# Patient Record
Sex: Female | Born: 1965
Health system: Southern US, Community
[De-identification: ages and names within clinical notes are randomized; demographics above are authoritative.]

## PROBLEM LIST (undated history)

## (undated) DIAGNOSIS — D573 Sickle-cell trait: Secondary | ICD-10-CM

## (undated) DIAGNOSIS — K3184 Gastroparesis: Secondary | ICD-10-CM

## (undated) DIAGNOSIS — A048 Other specified bacterial intestinal infections: Secondary | ICD-10-CM

## (undated) DIAGNOSIS — G43909 Migraine, unspecified, not intractable, without status migrainosus: Secondary | ICD-10-CM

## (undated) DIAGNOSIS — G4730 Sleep apnea, unspecified: Secondary | ICD-10-CM

## (undated) DIAGNOSIS — R011 Cardiac murmur, unspecified: Secondary | ICD-10-CM

## (undated) DIAGNOSIS — R0600 Dyspnea, unspecified: Secondary | ICD-10-CM

## (undated) DIAGNOSIS — E781 Pure hyperglyceridemia: Secondary | ICD-10-CM

## (undated) DIAGNOSIS — M1990 Unspecified osteoarthritis, unspecified site: Secondary | ICD-10-CM

## (undated) DIAGNOSIS — E119 Type 2 diabetes mellitus without complications: Secondary | ICD-10-CM

## (undated) DIAGNOSIS — K219 Gastro-esophageal reflux disease without esophagitis: Secondary | ICD-10-CM

## (undated) DIAGNOSIS — T7840XA Allergy, unspecified, initial encounter: Secondary | ICD-10-CM

## (undated) DIAGNOSIS — I82409 Acute embolism and thrombosis of unspecified deep veins of unspecified lower extremity: Secondary | ICD-10-CM

## (undated) DIAGNOSIS — I10 Essential (primary) hypertension: Secondary | ICD-10-CM

## (undated) DIAGNOSIS — M549 Dorsalgia, unspecified: Secondary | ICD-10-CM

## (undated) DIAGNOSIS — D649 Anemia, unspecified: Secondary | ICD-10-CM

## (undated) DIAGNOSIS — G5600 Carpal tunnel syndrome, unspecified upper limb: Secondary | ICD-10-CM

## (undated) HISTORY — PX: ABDOMINAL HYSTERECTOMY: SHX81

## (undated) HISTORY — DX: Type 2 diabetes mellitus without complications: E11.9

## (undated) HISTORY — DX: Pure hyperglyceridemia: E78.1

## (undated) HISTORY — PX: TONSILLECTOMY: SUR1361

## (undated) HISTORY — DX: Acute embolism and thrombosis of unspecified deep veins of unspecified lower extremity: I82.409

## (undated) HISTORY — DX: Carpal tunnel syndrome, unspecified upper limb: G56.00

## (undated) HISTORY — DX: Sickle-cell trait: D57.3

## (undated) HISTORY — DX: Allergy, unspecified, initial encounter: T78.40XA

## (undated) HISTORY — PX: CATARACT EXTRACTION: SUR2

## (undated) HISTORY — DX: Dorsalgia, unspecified: M54.9

## (undated) HISTORY — PX: OTHER SURGICAL HISTORY: SHX169

## (undated) HISTORY — DX: Cardiac murmur, unspecified: R01.1

## (undated) HISTORY — PX: TOE SURGERY: SHX1073

## (undated) HISTORY — DX: Unspecified osteoarthritis, unspecified site: M19.90

## (undated) HISTORY — DX: Anemia, unspecified: D64.9

---

## 2010-06-18 LAB — HM DEXA SCAN: HM Dexa Scan: NORMAL

## 2013-06-18 LAB — HM PAP SMEAR: HM Pap smear: NORMAL

## 2014-06-18 LAB — HM MAMMOGRAPHY: HM Mammogram: NORMAL (ref 0–4)

## 2014-10-24 ENCOUNTER — Emergency Department (HOSPITAL_COMMUNITY)
Admission: EM | Admit: 2014-10-24 | Discharge: 2014-10-24 | Disposition: A | Discharge status: Home / Self Care | Payer: 59 | Attending: Emergency Medicine | Admitting: Emergency Medicine

## 2014-10-24 ENCOUNTER — Encounter (HOSPITAL_COMMUNITY): Payer: Self-pay | Admitting: Emergency Medicine

## 2014-10-24 DIAGNOSIS — H109 Unspecified conjunctivitis: Secondary | ICD-10-CM

## 2014-10-24 DIAGNOSIS — I10 Essential (primary) hypertension: Secondary | ICD-10-CM | POA: Diagnosis not present

## 2014-10-24 DIAGNOSIS — H578 Other specified disorders of eye and adnexa: Secondary | ICD-10-CM | POA: Diagnosis present

## 2014-10-24 HISTORY — DX: Essential (primary) hypertension: I10

## 2014-10-24 MED ORDER — TETRACAINE HCL 0.5 % OP SOLN
1.0000 [drp] | Freq: Once | OPHTHALMIC | Status: AC
Start: 2014-10-24 — End: 2014-10-24
  Administered 2014-10-24: 1 [drp] via OPHTHALMIC
  Filled 2014-10-24: qty 2

## 2014-10-24 MED ORDER — POLYMYXIN B-TRIMETHOPRIM 10000-0.1 UNIT/ML-% OP SOLN: 1.0000 [drp] | OPHTHALMIC | Status: AC

## 2014-10-24 MED ORDER — FLUORESCEIN SODIUM 1 MG OP STRP
1.0000 | ORAL_STRIP | Freq: Once | OPHTHALMIC | Status: AC
  Administered 2014-10-24: 1 via OPHTHALMIC
  Filled 2014-10-24: qty 1

## 2014-10-24 NOTE — Discharge Instructions (Signed)
Conjunctivitis Conjunctivitis is commonly called "pink eye." Conjunctivitis can be caused by bacterial or viral infection, allergies, or injuries. There is usually redness of the lining of the eye, itching, discomfort, and sometimes discharge. There may be deposits of matter along the eyelids. A viral infection usually causes a watery discharge, while a bacterial infection causes a yellowish, thick discharge. Pink eye is very contagious and spreads by direct contact. You may be given antibiotic eyedrops as part of your treatment. Before using your eye medicine, remove all drainage from the eye by washing gently with warm water and cotton balls. Continue to use the medication until you have awakened 2 mornings in a row without discharge from the eye. Do not rub your eye. This increases the irritation and helps spread infection. Use separate towels from other household members. Wash your hands with soap and water before and after touching your eyes. Use cold compresses to reduce pain and sunglasses to relieve irritation from light. Do not wear contact lenses or wear eye makeup until the infection is gone. SEEK MEDICAL CARE IF:   Your symptoms are not better after 3 days of treatment.  You have increased pain or trouble seeing.  The outer eyelids become very red or swollen. Document Released: 07/12/2004 Document Revised: 08/27/2011 Document Reviewed: 06/04/2005 [name] Patient Information 2015 [place], [place]. This information is not intended to replace advice given to you by your health care provider. Make sure you discuss any questions you have with your health care provider. Allergic Conjunctivitis The conjunctiva is a thin membrane that covers the visible white part of the eyeball and the underside of the eyelids. This membrane protects and lubricates the eye. The membrane has small blood vessels running through it that can normally be seen. When the conjunctiva becomes inflamed, the condition is  called conjunctivitis. In response to the inflammation, the conjunctival blood vessels become swollen. The swelling results in redness in the normally white part of the eye. The blood vessels of this membrane also react when a person has allergies and is then called allergic conjunctivitis. This condition usually lasts for as long as the allergy persists. Allergic conjunctivitis cannot be passed to another person (non-contagious). The likelihood of bacterial infection is great and the cause is not likely due to allergies if the inflamed eye has:  A sticky discharge.  Discharge or sticking together of the lids in the morning.  Scaling or flaking of the eyelids where the eyelashes come out.  Red swollen eyelids. CAUSES   Viruses.  Irritants such as foreign bodies.  Chemicals.  General allergic reactions.  Inflammation or serious diseases in the inside or the outside of the eye or the orbit (the boney cavity in which the eye sits) can cause a "red eye." SYMPTOMS   Eye redness.  Tearing.  Itchy eyes.  Burning feeling in the eyes.  Clear drainage from the eye.  Allergic reaction due to pollens or ragweed sensitivity. Seasonal allergic conjunctivitis is frequent in the spring when pollens are in the air and in the fall. DIAGNOSIS  This condition, in its many forms, is usually diagnosed based on the history and an ophthalmological exam. It usually involves both eyes. If your eyes react at the same time every year, allergies may be the cause. While most "red eyes" are due to allergy or an infection, the role of an eye (ophthalmological) exam is important. The exam can rule out serious diseases of the eye or orbit. TREATMENT   Non-antibiotic eye drops, ointments,  or medications by mouth may be prescribed if the ophthalmologist is sure the conjunctivitis is due to allergies alone.  Over-the-counter drops and ointments for allergic symptoms should be used only after other causes of  conjunctivitis have been ruled out, or as your caregiver suggests. Medications by mouth are often prescribed if other allergy-related symptoms are present. If the ophthalmologist is sure that the conjunctivitis is due to allergies alone, treatment is normally limited to drops or ointments to reduce itching and burning. HOME CARE INSTRUCTIONS   Wash hands before and after applying drops or ointments, or touching the inflamed eye(s) or eyelids.  Do not let the eye dropper tip or ointment tube touch the eyelid when putting medicine in your eye.  Stop using your soft contact lenses and throw them away. Use a new pair of lenses when recovery is complete. You should run through sterilizing cycles at least three times before use after complete recovery if the old soft contact lenses are to be used. Hard contact lenses should be stopped. They need to be thoroughly sterilized before use after recovery.  Itching and burning eyes due to allergies is often relieved by using a cool cloth applied to closed eye(s). SEEK MEDICAL CARE IF:   Your problems do not go away after two or three days of treatment.  Your lids are sticky (especially in the morning when you wake up) or stick together.  Discharge develops. Antibiotics may be needed either as drops, ointment, or by mouth.  You have extreme light sensitivity.  An oral temperature above 102 F (38.9 C) develops.  Pain in or around the eye or any other visual symptom develops. MAKE SURE YOU:   Understand these instructions.  Will watch your condition.  Will get help right away if you are not doing well or get worse. Document Released: 08/25/2002 Document Revised: 08/27/2011 Document Reviewed: 07/21/2007 [facility] Patient Information 2015 [place], [place]. This information is not intended to replace advice given to you by your health care provider. Make sure you discuss any questions you have with your health care provider.

## 2014-10-24 NOTE — ED Provider Notes (Signed)
CSN: [id number]     Arrival date & time 10/24/14  1936 History  This chart was scribed for non-physician practitioner Lorre Munroe, PA, working with Malvin Johns, MD, by Eustaquio Maize, ED Scribe. This patient was seen in room TR04C/TR04C and the patient's care was started at 7:55 PM.    Chief Complaint  Patient presents with  . Eye Problem   The history is provided by the patient. No language interpreter was used.     HPI Comments: Monique Wiggins is a 49 y.o. female with hx HTN who presents to the Emergency Department complaining of bilateral itching to eyes that began earlier today. She also complains of mild blurry vision. Pt reports that within 2 hours of onset, her eyes began to swell and become red. Pt rinsed her eyes out with water and took Benadryl without relief. Pt denies getting anything caught in the eyes. Pt mentions that she picked up a golf ball earlier today and rubbed her left eye shortly afterwards. She mentions that while golfing, the grass was being cut and flying all around. Denies exposure to anyone with pink eye.   Past Medical History  Diagnosis Date  . Hypertension    Past Surgical History  Procedure Laterality Date  . Abdominal hysterectomy    . Tummy tuck    . Tonsillectomy     No family history on file. History  Substance Use Topics  . Smoking status: Never Smoker   . Smokeless tobacco: Not on file  . Alcohol Use: Yes   OB History    No data available     Review of Systems  HENT: Negative for congestion and rhinorrhea.   Eyes: Positive for redness, itching and visual disturbance (Blurry vision to bilateral eyes. ).  Respiratory: Negative for cough and shortness of breath.   Cardiovascular: Negative for chest pain.  Gastrointestinal: Negative for nausea and vomiting.  Skin: Negative for rash.  Allergic/Immunologic: Negative for environmental allergies and food allergies.  Neurological: Negative for dizziness, light-headedness and headaches.       Allergies  Review of patient's allergies indicates no known allergies.  Home Medications   Prior to Admission medications   Not on File   Triage Vitals: BP 199/110 mmHg  Pulse 70  Temp(Src) 98.2 F (36.8 C) (Oral)  Resp 18  Ht 5\' 2"  (1.575 m)  Wt 175 lb (79.379 kg)  BMI 32.00 kg/m2  SpO2 100%   Physical Exam  Constitutional: She is oriented to person, place, and time. She appears well-developed and well-nourished. No distress.  HENT:  Head: Normocephalic and atraumatic.  Eyes: Conjunctivae and EOM are normal.  Mildly erythematous, swollen conjunctiva bilaterally, no foreign body, no exudate, no fluorescein uptake  Neck: Neck supple. No tracheal deviation present.  Cardiovascular: Normal rate.   Pulmonary/Chest: Effort normal. No respiratory distress.  Musculoskeletal: Normal range of motion.  Neurological: She is alert and oriented to person, place, and time.  Skin: Skin is warm and dry.  Psychiatric: She has a normal mood and affect. Her behavior is normal.  Nursing note and vitals reviewed.   ED Course  Procedures (including critical care time)  DIAGNOSTIC STUDIES: Oxygen Saturation is 100% on RA, normal by my interpretation.    COORDINATION OF CARE: 8:06 PM-Discussed treatment plan which includes OTC allergy medication with pt at bedside and pt agreed to plan.   Labs Review Labs Reviewed - No data to display  Imaging Review No results found.   EKG Interpretation None  MDM   Final diagnoses:  Bilateral conjunctivitis   Patient with allergic conjunctivitis. Will discharge to home with Polytrim drops. Recommend ophthalmology follow-up in 2 days no better. Recommend continuing taking Benadryl and Zyrtec/Claritin. Return precautions discussed.  I personally performed the services described in this documentation, which was scribed in my presence. The recorded information has been reviewed and is accurate.      Montine Circle,  PA-C 10/24/14 2013  Malvin Johns, MD 10/24/14 2248

## 2014-10-24 NOTE — ED Notes (Signed)
Pt c/o bilateral eye pain, redness, drainage and swelling. Reports being [place] golf [place] today in which they were mowing when pt felt irritation to her eyes. Also reports blurred vision. Pt noted to be hypertensive with hx of the same. Pt takes medications at home, did not take medication this morning

## 2014-10-24 NOTE — ED Notes (Signed)
Pt. reports bilateral red , teary and itchy eyes onset this evening with mild blurred vision .

## 2016-07-22 ENCOUNTER — Emergency Department (HOSPITAL_COMMUNITY)
Admission: EM | Admit: 2016-07-22 | Discharge: 2016-07-22 | Disposition: A | Discharge status: Home / Self Care | Payer: BLUE CROSS/BLUE SHIELD | Attending: Emergency Medicine | Admitting: Emergency Medicine

## 2016-07-22 ENCOUNTER — Encounter (HOSPITAL_COMMUNITY): Payer: Self-pay | Admitting: *Deleted

## 2016-07-22 DIAGNOSIS — Z79899 Other long term (current) drug therapy: Secondary | ICD-10-CM | POA: Diagnosis not present

## 2016-07-22 DIAGNOSIS — M545 Low back pain, unspecified: Secondary | ICD-10-CM

## 2016-07-22 DIAGNOSIS — R112 Nausea with vomiting, unspecified: Secondary | ICD-10-CM | POA: Diagnosis not present

## 2016-07-22 DIAGNOSIS — I10 Essential (primary) hypertension: Secondary | ICD-10-CM | POA: Insufficient documentation

## 2016-07-22 HISTORY — DX: Other specified bacterial intestinal infections: A04.8

## 2016-07-22 HISTORY — DX: Migraine, unspecified, not intractable, without status migrainosus: G43.909

## 2016-07-22 LAB — URINALYSIS, ROUTINE W REFLEX MICROSCOPIC
Bacteria, UA: NONE SEEN
Bilirubin Urine: NEGATIVE
GLUCOSE, UA: NEGATIVE mg/dL
Hgb urine dipstick: NEGATIVE
Ketones, ur: NEGATIVE mg/dL
Leukocytes, UA: NEGATIVE
Nitrite: NEGATIVE
PROTEIN: NEGATIVE mg/dL
Specific Gravity, Urine: 1.019 (ref 1.005–1.030)
pH: 5 (ref 5.0–8.0)

## 2016-07-22 LAB — COMPREHENSIVE METABOLIC PANEL
ALBUMIN: 4.3 g/dL (ref 3.5–5.0)
ALK PHOS: 66 U/L (ref 38–126)
ALT: 22 U/L (ref 14–54)
ANION GAP: 8 (ref 5–15)
AST: 23 U/L (ref 15–41)
BUN: 29 mg/dL — ABNORMAL HIGH (ref 6–20)
CALCIUM: 8.9 mg/dL (ref 8.9–10.3)
CO2: 24 mmol/L (ref 22–32)
CREATININE: 0.98 mg/dL (ref 0.44–1.00)
Chloride: 104 mmol/L (ref 101–111)
GFR calc Af Amer: >60 mL/min (ref >60)
GFR calc non Af Amer: >60 mL/min (ref >60)
GLUCOSE: 119 mg/dL — AB (ref 65–99)
Potassium: 3 mmol/L — ABNORMAL LOW (ref 3.5–5.1)
SODIUM: 136 mmol/L (ref 135–145)
Total Bilirubin: 0.8 mg/dL (ref 0.3–1.2)
Total Protein: 7.3 g/dL (ref 6.5–8.1)

## 2016-07-22 LAB — CBC
HCT: 39.4 % (ref 36.0–46.0)
HEMOGLOBIN: 13.2 g/dL (ref 12.0–15.0)
MCH: 27 pg (ref 26.0–34.0)
MCHC: 33.5 g/dL (ref 30.0–36.0)
MCV: 80.6 fL (ref 78.0–100.0)
Platelets: 385 10*3/uL (ref 150–400)
RBC: 4.89 MIL/uL (ref 3.87–5.11)
RDW: 13.9 % (ref 11.5–15.5)
WBC: 10.4 10*3/uL (ref 4.0–10.5)

## 2016-07-22 LAB — LIPASE, BLOOD: Lipase: 20 U/L (ref 11–51)

## 2016-07-22 MED ORDER — POTASSIUM CHLORIDE CRYS ER 20 MEQ PO TBCR
40.0000 meq | EXTENDED_RELEASE_TABLET | Freq: Once | ORAL | Status: AC
  Administered 2016-07-22: 40 meq via ORAL
  Filled 2016-07-22: qty 2

## 2016-07-22 MED ORDER — SODIUM CHLORIDE 0.9 % IV BOLUS (SEPSIS)
1000.0000 mL | Freq: Once | INTRAVENOUS | Status: AC
  Administered 2016-07-22: 1000 mL via INTRAVENOUS

## 2016-07-22 MED ORDER — METHOCARBAMOL 500 MG PO TABS: 500.0000 mg | ORAL_TABLET | Freq: Every evening | ORAL | 0 refills | Status: DC | PRN

## 2016-07-22 MED ORDER — ONDANSETRON HCL 4 MG PO TABS: 4.0000 mg | ORAL_TABLET | Freq: Four times a day (QID) | ORAL | 0 refills | Status: DC

## 2016-07-22 MED ORDER — ONDANSETRON 4 MG PO TBDP
4.0000 mg | ORAL_TABLET | Freq: Once | ORAL | Status: AC | PRN
  Administered 2016-07-22: 4 mg via ORAL
  Filled 2016-07-22: qty 1

## 2016-07-22 MED ORDER — NAPROXEN 500 MG PO TABS: 500.0000 mg | ORAL_TABLET | Freq: Two times a day (BID) | ORAL | 0 refills | Status: DC

## 2016-07-22 NOTE — ED Notes (Signed)
Ginger Ale and crackers given to patient.

## 2016-07-22 NOTE — ED Triage Notes (Signed)
BIB [facility] with Abd pain in lower abd, started about 5am, went to work symptoms worse, lower back included. Pt has # 20 R h, declined Zofran, EKG SR

## 2016-07-22 NOTE — ED Notes (Signed)
Pt ambulated to restroom without difficulty

## 2016-07-22 NOTE — ED Notes (Signed)
Patient states that she also has right flank pain that began x2 days ago.

## 2016-07-22 NOTE — ED Provider Notes (Signed)
[facility] DEPT Provider Note   CSN: [id number] Arrival date & time: 07/22/16  0913     History   Chief Complaint Chief Complaint  Patient presents with  . Abdominal Pain    HPI Monique Wiggins is a 51 y.o. female presenting with sudden onset right-sided lower back pain earlier today. She has had milder right lower back pain for one week. She woke up this morning and had lower abdominal pain while straining to have a bowel movement and felt sweaty. Abdominal pain subsided after bowel movement and she went to work. Once at work she had another episode but was her right lower back accompanied by nausea and vomiting. She was in a patient room when she vomited and had severe back pain and she could not move. This is when EMS was called.  As I was assessing her, she described her abdominal discomfort as comparable to mild menstrual cramps and reports feeling bloated lately. Patient states that she had a hysterectomy 10 years ago. She also had H pylori back in 1997 which does not show on her record and reports feeling bloated after meals.  She has tried 800 ibuprofen twice a day for the past week which was helping until today. No history of kidney stones but has family hx (sister). She was given Zofran prior to my assessment and is no longer nauseated. she was comfortable at this time. She denies fever, dysuria, hematuria, diarrhea, blood in her stool, coffee-ground emesis, black tarry stools or cold symptoms. She does reports ill contacts at work with "stomach bug".  HPI  Past Medical History:  Diagnosis Date  . H. pylori infection    1997  . Hypertension   . Migraines     There are no active problems to display for this patient.   Past Surgical History:  Procedure Laterality Date  . ABDOMINAL HYSTERECTOMY    . TOE SURGERY    . TONSILLECTOMY      OB History    No data available       Home Medications    Prior to Admission medications   Medication Sig Start Date End Date  Taking? Authorizing Provider  ibuprofen (ADVIL,MOTRIN) 200 MG tablet Take 800 mg by mouth every 6 (six) hours as needed for mild pain.   Yes Historical Provider, MD  topiramate (TOPAMAX) 50 MG tablet Take 50 mg by mouth daily.   Yes Historical Provider, MD  methocarbamol (ROBAXIN) 500 MG tablet Take 1 tablet (500 mg total) by mouth at bedtime as needed for muscle spasms. 07/22/16   Emeline General, PA-C  naproxen (NAPROSYN) 500 MG tablet Take 1 tablet (500 mg total) by mouth 2 (two) times daily with a meal. 07/22/16   Emeline General, PA-C  ondansetron (ZOFRAN) 4 MG tablet Take 1 tablet (4 mg total) by mouth every 6 (six) hours. 07/22/16   Emeline General, PA-C    Family History No family history on file.  Social History Social History  Substance Use Topics  . Smoking status: Never Smoker  . Smokeless tobacco: Never Used  . Alcohol use No     Allergies   Shellfish allergy   Review of Systems Review of Systems  Constitutional: Positive for chills. Negative for appetite change and fever.  HENT: Negative for congestion, ear pain, postnasal drip, rhinorrhea, sinus pressure, sore throat and trouble swallowing.   Eyes: Negative for pain and visual disturbance.  Respiratory: Negative for cough, chest tightness, shortness of breath, wheezing and stridor.  Cardiovascular: Negative for chest pain, palpitations and leg swelling.  Gastrointestinal: Positive for abdominal distention, constipation, nausea and vomiting. Negative for abdominal pain, blood in stool and diarrhea.  Genitourinary: Negative for difficulty urinating, dysuria, flank pain, frequency, hematuria and pelvic pain.  Musculoskeletal: Positive for back pain and myalgias. Negative for arthralgias, gait problem, joint swelling, neck pain and neck stiffness.  Skin: Negative for color change, pallor and rash.  Neurological: Negative for dizziness, seizures, syncope, weakness and numbness.  All other systems reviewed and are  negative.    Physical Exam Updated Vital Signs BP 114/75   Pulse (!) 59   Temp 98.5 F (36.9 C) (Oral)   Resp 16   Ht 5' 1.75" (1.568 m)   Wt 77.1 kg   SpO2 98%   BMI 31.35 kg/m   Physical Exam  Constitutional: She is oriented to person, place, and time. She appears well-developed and well-nourished. No distress.  Patient is afebrile, nontoxic appearing, sitting comfortably in bed in no acute distress.  HENT:  Head: Normocephalic and atraumatic.  Mouth/Throat: Oropharynx is clear and moist. No oropharyngeal exudate.  Eyes: Conjunctivae and EOM are normal. Pupils are equal, round, and reactive to light. Right eye exhibits no discharge. Left eye exhibits no discharge. No scleral icterus.  Neck: Normal range of motion. Neck supple.  Cardiovascular: Normal rate, regular rhythm and normal heart sounds.   No murmur heard. Pulmonary/Chest: Effort normal and breath sounds normal. No respiratory distress. She has no wheezes. She has no rales. She exhibits no tenderness.  Abdominal: Soft. Bowel sounds are normal. She exhibits no distension and no mass. There is no tenderness. There is no rebound and no guarding.  Patient is nontender to palpation of the abdomen. No peritoneal signs, guarding, negative Murphy, negative McBurney, negative rebound.  Musculoskeletal: She exhibits no edema.  Neurological: She is alert and oriented to person, place, and time. No sensory deficit. She exhibits normal muscle tone. Coordination normal.  Skin: Skin is warm and dry. No rash noted. She is not diaphoretic. No pallor.  Psychiatric: She has a normal mood and affect. Her behavior is normal.  Nursing note and vitals reviewed.    ED Treatments / Results  Labs (all labs ordered are listed, but only abnormal results are displayed) Labs Reviewed  COMPREHENSIVE METABOLIC PANEL - Abnormal; Notable for the following:       Result Value   Potassium 3.0 (*)    Glucose, Bld 119 (*)    BUN 29 (*)    All  other components within normal limits  URINALYSIS, ROUTINE W REFLEX MICROSCOPIC - Abnormal; Notable for the following:    Squamous Epithelial / LPF 0-5 (*)    All other components within normal limits  LIPASE, BLOOD  CBC    EKG  EKG Interpretation None       Radiology No results found.  Procedures Procedures (including critical care time)  Medications Ordered in ED Medications  ondansetron (ZOFRAN-ODT) disintegrating tablet 4 mg (4 mg Oral Given 07/22/16 0933)  sodium chloride 0.9 % bolus 1,000 mL (1,000 mLs Intravenous New Bag/Given 07/22/16 1548)  potassium chloride SA (K-DUR,KLOR-CON) CR tablet 40 mEq (40 mEq Oral Given 07/22/16 1548)     Initial Impression / Assessment and Plan / ED Course  I have reviewed the triage vital signs and the nursing notes.  Pertinent labs & imaging results that were available during my care of the patient were reviewed by me and considered in my medical decision making (see chart  for details).    51 year old female presenting with lower back pain, nausea, vomiting. Patient was already feeling better after Zofran. When I assessed her she was comfortably lying in bed in no distress with no pain or nausea. Patient is afebrile and nontoxic and well-appearing. Reassuring exam.  On exam she was tender to palpation of the posterior superior iliac spine. No abdominal tenderness to palpation and exam otherwise unremarkable. Labs unremarkable other than mild hypokalemia and signs of dehydration.  Patient presents with lower back pain.  No gross neurological deficits and normal neuro exam.  Patient has no gait abnormality or concern for cauda equina.  No loss of bowel or bladder control, fever, night sweats, weight loss, h/o malignancy, or IVDU.  RICE protocol and pain medications indicated and discussed with patient.   Patient walked herself to the bathroom while in the emergency department with no difficulties  IV fluids and potassium supplement. Will  Reassess  3:00 pm checked on patient and ordered fluids had not been initiated. Contacted nursing to get iv fluids started. Will reassess after fluids.  Transferred patient care at end of shift to Will Dansie, PA-C, pending fluid completion and anticipating discharge home with PCP f/U.   4:30- reassessed patient and she was doing well. Discharge plan was discussed with patient and she agreed with said plan. Patient significantly improved while in ED. Discussed strict return precautions. Patient was advised to return to the emergency department if experiencing any worsening of symptoms.Patient understood instructions and agreed with discharge plan.  Patient was discussed with Dr. Lacinda Axon who also has seen patient and agrees with assessment and plan.  Final Clinical Impressions(s) / ED Diagnoses   Final diagnoses:  Acute right-sided low back pain without sciatica  Non-intractable vomiting with nausea, unspecified vomiting type    New Prescriptions New Prescriptions   METHOCARBAMOL (ROBAXIN) 500 MG TABLET    Take 1 tablet (500 mg total) by mouth at bedtime as needed for muscle spasms.   NAPROXEN (NAPROSYN) 500 MG TABLET    Take 1 tablet (500 mg total) by mouth 2 (two) times daily with a meal.   ONDANSETRON (ZOFRAN) 4 MG TABLET    Take 1 tablet (4 mg total) by mouth every 6 (six) hours.     Emeline General, PA-C 07/22/16 [place], MD 07/24/16 [phone]

## 2016-07-22 NOTE — ED Provider Notes (Signed)
Patient care assumed from Avie Echevaria, PA-C at shift change please see her note for further.  Briefly, the patient presented with abdominal and back pain with nausea and vomiting.  Plan at shift changes to reevaluate after fluids and by mouth trial. Plan is for discharge if patient passes by mouth trial. Patient received fluid bolus and tolerated by mouth trial. On my evaluation she tells me she is feeling much better. She is a middle-aged the bathroom. Her abdominal pain has resolved. She feels ready for discharge. We'll discharge with prescriptions as dictated by previous provider. I discussed return precautions. I advised the patient to follow-up with their primary care provider this week. I advised the patient to return to the emergency department with new or worsening symptoms or new concerns. The patient verbalized understanding and agreement with plan.     Acute right-sided low back pain without sciatica  Non-intractable vomiting with nausea, unspecified vomiting type     Waynetta Pean, PA-C 07/22/16 1727    Nat Christen, MD 07/23/16 [place], MD 07/23/16 [phone]

## 2016-10-29 ENCOUNTER — Telehealth: Payer: Self-pay | Admitting: Behavioral Health

## 2016-10-29 ENCOUNTER — Encounter: Payer: Self-pay | Admitting: Behavioral Health

## 2016-10-29 NOTE — Telephone Encounter (Signed)
Pre-Visit Call completed with patient and chart updated.   Pre-Visit Info documented in Specialty Comments under [employer].    

## 2016-10-30 ENCOUNTER — Encounter: Payer: Self-pay | Admitting: Family

## 2016-10-30 ENCOUNTER — Ambulatory Visit (INDEPENDENT_AMBULATORY_CARE_PROVIDER_SITE_OTHER): Payer: BLUE CROSS/BLUE SHIELD | Admitting: Family

## 2016-10-30 VITALS — BP 136/96 | HR 56 | Temp 98.3°F | Resp 18 | Ht 62.5 in | Wt 181.0 lb

## 2016-10-30 DIAGNOSIS — G43909 Migraine, unspecified, not intractable, without status migrainosus: Secondary | ICD-10-CM

## 2016-10-30 DIAGNOSIS — Z889 Allergy status to unspecified drugs, medicaments and biological substances status: Secondary | ICD-10-CM

## 2016-10-30 DIAGNOSIS — R7303 Prediabetes: Secondary | ICD-10-CM

## 2016-10-30 DIAGNOSIS — Z9189 Other specified personal risk factors, not elsewhere classified: Secondary | ICD-10-CM

## 2016-10-30 DIAGNOSIS — I10 Essential (primary) hypertension: Secondary | ICD-10-CM | POA: Diagnosis not present

## 2016-10-30 DIAGNOSIS — E876 Hypokalemia: Secondary | ICD-10-CM

## 2016-10-30 LAB — BASIC METABOLIC PANEL
BUN: 14 mg/dL (ref 6–23)
CHLORIDE: 106 meq/L (ref 96–112)
CO2: 24 mEq/L (ref 19–32)
Calcium: 9.4 mg/dL (ref 8.4–10.5)
Creatinine, Ser: 1.02 mg/dL (ref 0.40–1.20)
GFR: 73.17 mL/min (ref >60.00)
Glucose, Bld: 101 mg/dL — ABNORMAL HIGH (ref 70–99)
Potassium: 3.1 mEq/L — ABNORMAL LOW (ref 3.5–5.1)
SODIUM: 138 meq/L (ref 135–145)

## 2016-10-30 MED ORDER — EPINEPHRINE 0.3 MG/0.3ML IJ SOAJ: INTRAMUSCULAR | 0 refills | Status: DC

## 2016-10-30 MED ORDER — CETIRIZINE HCL 10 MG PO TABS: 10.0000 mg | ORAL_TABLET | Freq: Every day | ORAL | 11 refills | Status: DC

## 2016-10-30 NOTE — Patient Instructions (Signed)
Please complete lab work prior to leaving. You will be contacted about your referral to nutrition and allergy. Keep epipen and liquid benadryl with you at all times. Avoid all shellfish. Begin zyrtec 10mg  once daily to help with your allergy symptoms. Welcome to [facility]!

## 2016-10-31 ENCOUNTER — Other Ambulatory Visit: Payer: Self-pay | Admitting: Family

## 2016-10-31 ENCOUNTER — Telehealth: Payer: Self-pay | Admitting: Family

## 2016-10-31 DIAGNOSIS — E876 Hypokalemia: Secondary | ICD-10-CM

## 2016-10-31 NOTE — Progress Notes (Signed)
Subjective:    Patient ID: Monique Wiggins, female    DOB: July 12, 1965, 51 y.o.   MRN: [id number]  HPI   [name] is a 51 yr old female who presents today to establish care.  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 Reports that if sh touches raw shellfish she has local swelling. Had similar reaction to her sister's cat (eyes swelled up and she began to have tongue swelling.   2) Hypertension- currently maintained on amlodipine, coreg and hyzaar.   3) Migraine- maintained on topamax. Reports that migraines are well controlled. She brings with her lab work from April from her previous provider.  4) Hyperglycemia- Labs from previous provider show A1C 6.1 back in April. She was not aware of this.  Notes strong family hx of DM2    Review of Systems    see HPI  Past Medical History:  Diagnosis Date  . Allergy   . Hypertension      Social History   Social History  . Marital status: Married    Spouse name: N/A  . Number of children: N/A  . Years of education: N/A   Occupational History  . Not on file.   Social History Main Topics  . Smoking status: Never Smoker  . Smokeless tobacco: Never Used  . Alcohol use Yes  . Drug use: No  . Sexual activity: Not on file   Other Topics Concern  . Not on file   Social History Narrative  . No narrative on file    Past Surgical History:  Procedure Laterality Date  . ABDOMINAL HYSTERECTOMY    . TOE SURGERY    . TONSILLECTOMY    . tummy tuck      Family History  Problem Relation Age of Onset  . Diabetes Neg Hx     Allergies  Allergen Reactions  . Pollen Extract Swelling  . Shellfish Allergy Swelling    Current Outpatient Prescriptions on File Prior to Visit  Medication Sig Dispense Refill  . amLODipine (NORVASC) 5 MG tablet Take 5 mg by mouth daily.    . carvedilol (COREG) 25 MG tablet Take 25 mg by mouth 2 (two) times daily with a meal.    . mometasone  (ELOCON) 0.1 % cream Apply 1 application topically daily.    Marland Kitchen topiramate (TOPAMAX) 50 MG tablet Take 50 mg by mouth daily.     No current facility-administered medications on file prior to visit.     BP (!) 136/96 (BP Location: Right Arm, Cuff Size: Normal)   Pulse (!) 56   Temp 98.3 F (36.8 C) (Oral)   Resp 18   Ht 5' 2.5" (1.588 m)   Wt 181 lb (82.1 kg)   SpO2 98%   BMI 32.58 kg/m    Objective:   Physical Exam  Constitutional: She is oriented to person, place, and time. She appears well-developed and well-nourished.  HENT:  Head: Normocephalic and atraumatic.  Mild eczema noted in bilateral ear canals  Eyes: Conjunctivae are normal. No scleral icterus.  Neck: Neck supple. No thyromegaly present.  Cardiovascular: Normal rate, regular rhythm and normal heart sounds.   No murmur heard. Pulmonary/Chest: Effort normal and breath sounds normal. No respiratory distress. She has no wheezes.  Musculoskeletal: She exhibits no edema.  Lymphadenopathy:    She has no cervical adenopathy.  Neurological: She is alert and oriented to person, place, and time.  Skin: Skin is warm and dry.  Psychiatric: She has a normal mood and affect. Her behavior is normal. Judgment and thought content normal.          Assessment & Plan:  Allergies- severe environmental and food (shellfish allergy). Refer to allergist for further evaluation and treatment. Rx provided for epipen and we discussed proper use.  She understands that if he needs to administer she will need to call 911. Add zyrtec 10mg  once daily.   HTN- DBP mildly elevated. Continue current BP meds for now. If not better next visit will need to further adjust meds.   Migraine- stable on topamax. Continue same. Recent BMET wnl.   Borderline DM- discussed diabetic diet, exercise and weight loss. Refer to nutritionist.   Hypokalemia- noted on last bmet.  Repeat bmet shows low K again, add Kdur- see phone note.   30 minutes spent with  pt today. >50% of this time was spent counseling patient on allergies, epipen use, diabetic diet/exercise/weight loss.

## 2016-10-31 NOTE — Telephone Encounter (Signed)
Potassium is low. Add Kdur once daily, repeat bmet in 1 week.

## 2016-10-31 NOTE — Telephone Encounter (Signed)
LMOM with contact name and number for return call RE: results and further provider instructions; Rx and future lab order pending/SLS 05/16

## 2016-10-31 NOTE — Telephone Encounter (Signed)
Caller name:Marticia Megan Salon Relationship to patient: Can be AJLUNGB:[phone] Pharmacy:  Reason for call:returing call regarding lab work, my leave voicemail if no answer

## 2016-11-01 ENCOUNTER — Other Ambulatory Visit: Payer: BLUE CROSS/BLUE SHIELD

## 2016-11-01 ENCOUNTER — Telehealth: Payer: Self-pay | Admitting: *Deleted

## 2016-11-01 MED ORDER — POTASSIUM CHLORIDE CRYS ER 20 MEQ PO TBCR: 20.0000 meq | EXTENDED_RELEASE_TABLET | Freq: Every day | ORAL | 3 refills | Status: DC

## 2016-11-01 NOTE — Telephone Encounter (Addendum)
See 10/31/16 refill note.

## 2016-11-01 NOTE — Telephone Encounter (Signed)
Pt notified and future orders signed.

## 2016-11-01 NOTE — Telephone Encounter (Signed)
PT signed records release for [facility]. Release faxed to [phone]. Awaiting records.

## 2016-11-08 ENCOUNTER — Other Ambulatory Visit (INDEPENDENT_AMBULATORY_CARE_PROVIDER_SITE_OTHER): Payer: BLUE CROSS/BLUE SHIELD

## 2016-11-08 ENCOUNTER — Encounter: Payer: Self-pay | Admitting: Family

## 2016-11-08 DIAGNOSIS — E876 Hypokalemia: Secondary | ICD-10-CM | POA: Diagnosis not present

## 2016-11-08 LAB — BASIC METABOLIC PANEL
BUN: 21 mg/dL (ref 6–23)
CALCIUM: 9 mg/dL (ref 8.4–10.5)
CO2: 27 mEq/L (ref 19–32)
Chloride: 106 mEq/L (ref 96–112)
Creatinine, Ser: 1.04 mg/dL (ref 0.40–1.20)
GFR: 71.54 mL/min (ref >60.00)
Glucose, Bld: 86 mg/dL (ref 70–99)
Potassium: 3.8 mEq/L (ref 3.5–5.1)
Sodium: 138 mEq/L (ref 135–145)

## 2016-11-20 ENCOUNTER — Ambulatory Visit: Payer: BLUE CROSS/BLUE SHIELD | Admitting: Registered"

## 2016-11-27 ENCOUNTER — Encounter: Payer: BLUE CROSS/BLUE SHIELD | Attending: Family | Admitting: Registered"

## 2016-11-27 ENCOUNTER — Encounter: Payer: Self-pay | Admitting: Registered"

## 2016-11-27 DIAGNOSIS — R739 Hyperglycemia, unspecified: Secondary | ICD-10-CM

## 2016-11-27 DIAGNOSIS — R7303 Prediabetes: Secondary | ICD-10-CM | POA: Diagnosis not present

## 2016-11-27 NOTE — Patient Instructions (Signed)
   Consider increasing activity with a goal of 3-5x per week for 30-45 min.  Aim to have balanced meals and snacks throughout the day  Continue to work on getting better sleep

## 2016-11-27 NOTE — Progress Notes (Signed)
Medical Nutrition Therapy:  Appt start time: 1100 end time:  1200.  Assessment:  Primary concerns today: Pt states she is here due to pre-diabetes. This patient is accompanied in the office by her spouse.  Pt states she has had a recent weight gain ~20 lbs since she got married in Sept. Pt states she used to eat healthier. Pt reports when she first got married they were eating out a lot, but they now cooks more at home. Pt works three 12-hr days. Pt states she will sometimes have green smoothie for meal replacement and uses recipes from [employer] book 'green smoothies for life.'  Preferred Learning Style:   No preference indicated   Learning Readiness:   Change in progress  MEDICATIONS: reveiwed   DIETARY INTAKE:  Usual eating pattern includes 3 meals and 2-3 snacks per day. Avoided foods include shellfish due to allergy.  24-hr recall:  B ( AM): bacon, eggs, grapefruit, water, coffee, herbal tea Snk ( AM): mixed nuts with dried fruit OR nutrigrain bar L ( PM): green smoothie (Pro, flaxseed, veggie & fruit) OR protein drink 30g Snk ( PM): same as morning snack D ( PM): salads, meat, sometimes potato, quinoa Snk ( PM): none OR icy pops Beverages: water, coffee, tea  Usual physical activity: ADL & walking  Estimated energy needs: 1600 calories 180 g carbohydrates 120 g protein 44 g fat  Progress Towards Goal(s):  In progress.   Nutritional Diagnosis:  NI-5.8.1 Inadequate carbohydrate intake As related to pt belief that carbs need to be very restricted.  As evidenced by pt states avoidance and dietary recall.    Intervention:  Nutrition Education. Nutrition education for managing blood glucose with diet and lifestyle changes. Described diabetes. Defined the role of glucose and insulin. Described the role of different macronutrients on glucose.  Explained how carbohydrates affect blood glucose.  Stated what foods contain the most carbohydrates.  Demonstrated carbohydrate  counting.  Demonstrated how to read Nutrition Facts food label. Described the role of stress management and exercise in BG control.   Plan:  Consider increasing activity with a goal of 3-5x per week for 30-45 min.  Aim to have balanced meals and snacks throughout the day  Continue to work on getting better sleep  Teaching Method Utilized:  Visual Auditory Hands on  Handouts given during visit include:  My Plate  Snack sheet  Barriers to learning/adherence to lifestyle change: none  Demonstrated degree of understanding via:  Teach Back   Monitoring/Evaluation:  Dietary intake, exercise, and body weight PRN.

## 2017-01-14 ENCOUNTER — Ambulatory Visit (INDEPENDENT_AMBULATORY_CARE_PROVIDER_SITE_OTHER): Payer: BLUE CROSS/BLUE SHIELD | Admitting: Medical

## 2017-01-14 ENCOUNTER — Encounter: Payer: Self-pay | Admitting: Medical

## 2017-01-14 VITALS — BP 152/88 | HR 72 | Temp 98.0°F | Resp 16 | Ht 62.0 in | Wt 189.0 lb

## 2017-01-14 DIAGNOSIS — I10 Essential (primary) hypertension: Secondary | ICD-10-CM | POA: Diagnosis not present

## 2017-01-14 MED ORDER — AMLODIPINE BESYLATE 10 MG PO TABS
10.0000 mg | ORAL_TABLET | Freq: Every day | ORAL | 0 refills | Status: DC
Start: 2017-01-14 — End: 2017-02-19

## 2017-01-14 NOTE — Progress Notes (Signed)
Subjective:    Patient ID: Monique Wiggins, female    DOB: 05-14-1966, 51 y.o.   MRN: [id number]  HPI  Pt in states recent at work 160/92. This was today. Around 12 pm. Work not stressful. No caffeine. Some ha since this morning. Pt states was mild . Did take hyzaar, coreg and norvasc today. She says has been on same regimen for years.   Pt states does not check bp regular basis. She last checked one month ago was on low side. She states 106/50-60.(she can't remember but thinks was electronic). She acknowledges this does not match historical trend on review today.  Bmp in may looked good.  Prior ha earlier today has for most part subsided.  In past pt states amlodipine in the past and ha pedal edema. But she now has socks that help prevent edema.She states socks are quite effective in preventing edema.    Review of Systems  Constitutional: Negative for chills, fatigue and fever.  Respiratory: Negative for cough, chest tightness, shortness of breath and wheezing.   Cardiovascular: Negative for chest pain and palpitations.  Gastrointestinal: Negative for abdominal pain, blood in stool, constipation, diarrhea, nausea and vomiting.  Genitourinary: Negative for dyspareunia and dysuria.  Musculoskeletal: Negative for back pain, gait problem and neck pain.  Skin: Negative for rash.  Neurological: Negative for dizziness, speech difficulty, weakness and headaches.       Ha resolved/subsided by time of exam.  Hematological: Negative for adenopathy. Does not bruise/bleed easily.  Psychiatric/Behavioral: Negative for behavioral problems and confusion.     Past Medical History:  Diagnosis Date  . Allergy   . Hypertension      Social History   Social History  . Marital status: Married    Spouse name: N/A  . Number of children: N/A  . Years of education: N/A   Occupational History  . Not on file.   Social History Main Topics  . Smoking status: Never Smoker  . Smokeless  tobacco: Never Used  . Alcohol use Yes  . Drug use: No  . Sexual activity: Not on file   Other Topics Concern  . Not on file   Social History Narrative  . No narrative on file    Past Surgical History:  Procedure Laterality Date  . ABDOMINAL HYSTERECTOMY    . TOE SURGERY    . TONSILLECTOMY    . tummy tuck      Family History  Problem Relation Age of Onset  . Diabetes Neg Hx     Allergies  Allergen Reactions  . Pollen Extract Swelling  . Shellfish Allergy Swelling    Current Outpatient Prescriptions on File Prior to Visit  Medication Sig Dispense Refill  . carvedilol (COREG) 25 MG tablet Take 25 mg by mouth 2 (two) times daily with a meal.    . cetirizine (ZYRTEC) 10 MG tablet Take 1 tablet (10 mg total) by mouth daily. 30 tablet 11  . EPINEPHrine 0.3 mg/0.3 mL IJ SOAJ injection Inject once into muscle as needed for severe allergic reaction, then call 911 2 Device 0  . losartan-hydrochlorothiazide (HYZAAR) 100-12.5 MG tablet Take 1 tablet by mouth daily.  1  . mometasone (ELOCON) 0.1 % cream Apply 1 application topically daily.    . potassium chloride SA (K-DUR,KLOR-CON) 20 MEQ tablet Take 1 tablet (20 mEq total) by mouth daily. 30 tablet 3  . terbinafine (LAMISIL) 250 MG tablet Take 1 tablet by mouth daily.  0  . topiramate (TOPAMAX) 50  MG tablet Take 50 mg by mouth daily.     No current facility-administered medications on file prior to visit.     BP (!) 152/88   Pulse 72   Temp 98 F (36.7 C) (Oral)   Resp 16   Ht 5\' 2"  (1.575 m)   Wt 189 lb (85.7 kg)   SpO2 99%   BMI 34.57 kg/m       Objective:   Physical Exam  General Mental Status- Alert. General Appearance- Not in acute distress.   Skin General: Color- Normal Color. Moisture- Normal Moisture.  Neck Carotid Arteries- Normal color. Moisture- Normal Moisture. No carotid bruits. No JVD.  Chest and Lung Exam Auscultation: Breath Sounds:-Normal.  Cardiovascular Auscultation:Rythm-  Regular. Murmurs & Other Heart Sounds:Auscultation of the heart reveals- No Murmurs.  Abdomen Inspection:-Inspeection Normal. Palpation/Percussion:Note:No mass. Palpation and Percussion of the abdomen reveal- Non Tender, Non Distended + BS, no rebound or guarding.    Neurologic Cranial Nerve exam:- CN III-XII intact(No nystagmus), symmetric smile. Drift Test:- No drift. Romberg Exam:- Negative.  Finger to Nose:- Normal/Intact Strength:- 5/5 equal and symmetric strength both upper and lower extremities.        Assessment & Plan:  For your htn will increase your amlodipine 10 mg a day. Continue other medications. Watch for pedal edema. If occurs let us know.  Check your bp 2-3 times a week to confirm level controlled. Goal less than 140/90  Follow up 2-3 weeks or as needed.(Would you my chart me bp readings)  But also advised schedule appointment with pcp some time October provided blood pressure remains controlled. Pt relatively new pt.  Delorise Hunkele, Percell Miller, PA-C

## 2017-01-14 NOTE — Patient Instructions (Addendum)
For your htn will increase your amlodipine 10 mg a day. Continue other medications. Watch for pedal edema. If occurs let us know.  Check your bp 2-3 times a week to confirm level controlled. Goal less than 140/90  Follow up 2-3 weeks or as needed.(Would you my chart me bp readings)

## 2017-01-30 ENCOUNTER — Encounter: Payer: BLUE CROSS/BLUE SHIELD | Admitting: Family

## 2017-02-15 ENCOUNTER — Encounter: Payer: BLUE CROSS/BLUE SHIELD | Admitting: Family

## 2017-02-19 ENCOUNTER — Encounter: Payer: Self-pay | Admitting: Family

## 2017-02-19 ENCOUNTER — Ambulatory Visit (INDEPENDENT_AMBULATORY_CARE_PROVIDER_SITE_OTHER): Payer: BLUE CROSS/BLUE SHIELD | Admitting: Family

## 2017-02-19 VITALS — BP 130/83 | HR 62 | Temp 98.7°F | Ht 62.0 in | Wt 187.0 lb

## 2017-02-19 DIAGNOSIS — Z0001 Encounter for general adult medical examination with abnormal findings: Secondary | ICD-10-CM | POA: Diagnosis not present

## 2017-02-19 DIAGNOSIS — I10 Essential (primary) hypertension: Secondary | ICD-10-CM

## 2017-02-19 DIAGNOSIS — R600 Localized edema: Secondary | ICD-10-CM | POA: Diagnosis not present

## 2017-02-19 DIAGNOSIS — Z0000 Encounter for general adult medical examination without abnormal findings: Secondary | ICD-10-CM

## 2017-02-19 MED ORDER — AMLODIPINE BESYLATE 5 MG PO TABS: 5.0000 mg | ORAL_TABLET | Freq: Every day | ORAL | 3 refills | Status: DC

## 2017-02-19 MED ORDER — LOSARTAN POTASSIUM-HCTZ 100-25 MG PO TABS: 1.0000 | ORAL_TABLET | Freq: Every day | ORAL | 3 refills | Status: DC

## 2017-02-19 NOTE — Patient Instructions (Addendum)
Please schedule routine eye exam and dental. Schedule mammogram on the first floor.  You will be contacted about your referral for colonoscopy.  Please work on healthy diet, adding regular cardio and light weight training 3-5 days a week  Decrease amlodipine form 10mg  to 5mg  due to swelling. Chang hyzaar from 100-12.5 to 100-25mg  once daily.

## 2017-02-19 NOTE — Progress Notes (Signed)
Subjective:    Patient ID: Monique Wiggins, female    DOB: 1965/10/28, 51 y.o.   MRN: [id number]  HPI  [name] is a 51 yr old female who presents today for complete physical.  Patient presents today for complete physical.  Immunizations: tetanus 3 years ago Diet:  Breakfast- 2 strips bacon, 2 eggs, grapefruit juice Lunch- leftovers: beef tenderloin, quinoa/kale, peaches Dinner- [employer], water  Trying to stay away from breads.   Boiled peanuts 2 cups of grapefruit juice  Exercise: she is not exercising frequently.  Has been working a lot.  Starting a new job Colonoscopy: due Pap Smear: hysterectomy Mammogram: 2016 Vision: due Dental: due    Wt Readings from Last 3 Encounters:  02/19/17 187 lb (84.8 kg)  01/14/17 189 lb (85.7 kg)  10/30/16 181 lb (82.1 kg)   HTN-  Reports LE edema since amlodipine was increased.    Review of Systems  Constitutional: Negative for unexpected weight change.  HENT: Negative for hearing loss and rhinorrhea.   Eyes: Negative for visual disturbance.  Respiratory: Negative for cough and shortness of breath.   Cardiovascular: Negative for chest pain.  Gastrointestinal: Negative for constipation and diarrhea.  Genitourinary: Negative for dysuria and frequency.  Musculoskeletal: Negative for arthralgias and myalgias.  Skin:       eczema  Neurological:       Reports HA's are well controlled.   Hematological: Negative for adenopathy.  Psychiatric/Behavioral:       Denies depression/anxiety    Past Medical History:  Diagnosis Date  . Allergy   . Hypertension      Social History   Social History  . Marital status: Married    Spouse name: N/A  . Number of children: N/A  . Years of education: N/A   Occupational History  . Not on file.   Social History Main Topics  . Smoking status: Never Smoker  . Smokeless tobacco: Never Used  . Alcohol use No     Comment: never  . Drug use: No  . Sexual activity: Not on file    Other Topics Concern  . Not on file   Social History Narrative  . No narrative on file    Past Surgical History:  Procedure Laterality Date  . ABDOMINAL HYSTERECTOMY    . TOE SURGERY    . TONSILLECTOMY    . tummy tuck      Family History  Problem Relation Age of Onset  . Diabetes type II Mother   . Diabetes Mellitus II Father   . Pulmonary fibrosis Father   . Hypertension Sister   . Diabetes Sister     Allergies  Allergen Reactions  . Pollen Extract Swelling  . Shellfish Allergy Swelling    Current Outpatient Prescriptions on File Prior to Visit  Medication Sig Dispense Refill  . amLODipine (NORVASC) 10 MG tablet Take 1 tablet (10 mg total) by mouth daily. 30 tablet 0  . carvedilol (COREG) 25 MG tablet Take 25 mg by mouth 2 (two) times daily with a meal.    . cetirizine (ZYRTEC) 10 MG tablet Take 1 tablet (10 mg total) by mouth daily. 30 tablet 11  . losartan-hydrochlorothiazide (HYZAAR) 100-12.5 MG tablet Take 1 tablet by mouth daily.  1  . mometasone (ELOCON) 0.1 % cream Apply 1 application topically daily.    . potassium chloride SA (K-DUR,KLOR-CON) 20 MEQ tablet Take 1 tablet (20 mEq total) by mouth daily. 30 tablet 3  . topiramate (TOPAMAX) 50 MG  tablet Take 50 mg by mouth daily.     No current facility-administered medications on file prior to visit.     BP 130/83   Pulse 62   Temp 98.7 F (37.1 C) (Oral)   Ht 5\' 2"  (1.575 m)   Wt 187 lb (84.8 kg)   SpO2 99%   BMI 34.20 kg/m       Objective:   Physical Exam Physical Exam  Constitutional: She is oriented to person, place, and time. She appears well-developed and well-nourished. No distress.  HENT:  Head: Normocephalic and atraumatic.  Right Ear: Tympanic membrane and ear canal normal.  Left Ear: Tympanic membrane and ear canal normal.  Mouth/Throat: Oropharynx is clear and moist.  Eyes: Pupils are equal, round, and reactive to light. No scleral icterus.  Neck: Normal range of motion. No  thyromegaly present.  Cardiovascular: Normal rate and regular rhythm.   No murmur heard. Pulmonary/Chest: Effort normal and breath sounds normal. No respiratory distress. He has no wheezes. She has no rales. She exhibits no tenderness.  Abdominal: Soft. Bowel sounds are normal. She exhibits no distension and no mass. There is no tenderness. There is no rebound and no guarding.  Musculoskeletal: She exhibits 1+ bilateral LE edema Lymphadenopathy:    She has no cervical adenopathy.  Neurological: She is alert and oriented to person, place, and time. She has normal patellar reflexes. She exhibits normal muscle tone. Coordination normal.  Skin: Skin is warm and dry.  Psychiatric: She has a normal mood and affect. Her behavior is normal. Judgment and thought content normal.  Breasts: Examined lying Right: Without masses, retractions, discharge or axillary adenopathy.  Left: Without masses, retractions, discharge or axillary adenopathy.        Assessment & Plan:          Assessment & Plan:  Preventative care- EKG tracing is personally reviewed.  EKG notes NSR.  No acute changes.  Discussed healthy diet, exercise, weight loss. Refer for mammo, colo. Obtain lab work.  HTN- has LE edema since amlodipine was increased from 5mg  to 10mg . Advised pt as follows:   Decrease amlodipine form 10mg  to 5mg  due to swelling. Chang hyzaar from 100-12.5 to 100-25mg  once daily.

## 2017-02-20 ENCOUNTER — Encounter: Payer: Self-pay | Admitting: Gastroenterology

## 2017-02-20 LAB — LIPID PANEL
Cholesterol: 150 mg/dL (ref 0–200)
HDL: 52.6 mg/dL (ref >39.00)
NonHDL: 97.57
Total CHOL/HDL Ratio: 3
Triglycerides: 269 mg/dL — ABNORMAL HIGH (ref 0.0–149.0)
VLDL: 53.8 mg/dL — ABNORMAL HIGH (ref 0.0–40.0)

## 2017-02-20 LAB — TSH: TSH: 2.12 u[IU]/mL (ref 0.35–4.50)

## 2017-02-20 LAB — LDL CHOLESTEROL, DIRECT: LDL DIRECT: 43 mg/dL

## 2017-02-21 ENCOUNTER — Encounter (HOSPITAL_BASED_OUTPATIENT_CLINIC_OR_DEPARTMENT_OTHER): Payer: Self-pay

## 2017-02-21 ENCOUNTER — Ambulatory Visit (HOSPITAL_BASED_OUTPATIENT_CLINIC_OR_DEPARTMENT_OTHER)
Admission: RE | Admit: 2017-02-21 | Discharge: 2017-02-21 | Disposition: A | Discharge status: Home / Self Care | Payer: BLUE CROSS/BLUE SHIELD | Source: Ambulatory Visit | Attending: Family | Admitting: Family

## 2017-02-21 DIAGNOSIS — Z1231 Encounter for screening mammogram for malignant neoplasm of breast: Secondary | ICD-10-CM | POA: Diagnosis not present

## 2017-02-21 DIAGNOSIS — Z0000 Encounter for general adult medical examination without abnormal findings: Secondary | ICD-10-CM

## 2017-02-22 ENCOUNTER — Encounter: Payer: Self-pay | Admitting: Family

## 2017-02-22 DIAGNOSIS — E785 Hyperlipidemia, unspecified: Secondary | ICD-10-CM | POA: Insufficient documentation

## 2017-02-22 DIAGNOSIS — E781 Pure hyperglyceridemia: Secondary | ICD-10-CM

## 2017-02-22 HISTORY — DX: Pure hyperglyceridemia: E78.1

## 2017-03-06 ENCOUNTER — Telehealth: Payer: Self-pay | Admitting: *Deleted

## 2017-03-06 NOTE — Telephone Encounter (Signed)
Pt called wanting to discuss her lab results. States she received her labs in the mail but didn't get the lab letter date 02/22/17. Discussed recommendations with pt per letter and she voices understanding.

## 2017-03-29 ENCOUNTER — Ambulatory Visit: Payer: BLUE CROSS/BLUE SHIELD | Admitting: Family

## 2017-04-04 DIAGNOSIS — Z23 Encounter for immunization: Secondary | ICD-10-CM | POA: Diagnosis not present

## 2017-04-19 ENCOUNTER — Telehealth: Payer: Self-pay | Admitting: Family

## 2017-04-19 ENCOUNTER — Ambulatory Visit (INDEPENDENT_AMBULATORY_CARE_PROVIDER_SITE_OTHER): Payer: 59 | Admitting: Family

## 2017-04-19 ENCOUNTER — Ambulatory Visit (AMBULATORY_SURGERY_CENTER): Payer: Self-pay

## 2017-04-19 ENCOUNTER — Ambulatory Visit (HOSPITAL_BASED_OUTPATIENT_CLINIC_OR_DEPARTMENT_OTHER)
Admission: RE | Admit: 2017-04-19 | Discharge: 2017-04-19 | Disposition: A | Discharge status: Home / Self Care | Payer: 59 | Source: Ambulatory Visit | Attending: Family | Admitting: Family

## 2017-04-19 ENCOUNTER — Encounter: Payer: Self-pay | Admitting: Family

## 2017-04-19 VITALS — Ht 61.75 in | Wt 190.0 lb

## 2017-04-19 VITALS — BP 130/78 | HR 65 | Temp 98.4°F | Resp 16 | Ht 62.0 in | Wt 191.8 lb

## 2017-04-19 DIAGNOSIS — E876 Hypokalemia: Secondary | ICD-10-CM

## 2017-04-19 DIAGNOSIS — Z1211 Encounter for screening for malignant neoplasm of colon: Secondary | ICD-10-CM

## 2017-04-19 DIAGNOSIS — G8929 Other chronic pain: Secondary | ICD-10-CM | POA: Insufficient documentation

## 2017-04-19 DIAGNOSIS — M4318 Spondylolisthesis, sacral and sacrococcygeal region: Secondary | ICD-10-CM | POA: Diagnosis not present

## 2017-04-19 DIAGNOSIS — M545 Low back pain: Secondary | ICD-10-CM | POA: Diagnosis not present

## 2017-04-19 DIAGNOSIS — I10 Essential (primary) hypertension: Secondary | ICD-10-CM | POA: Diagnosis not present

## 2017-04-19 DIAGNOSIS — Q7649 Other congenital malformations of spine, not associated with scoliosis: Secondary | ICD-10-CM | POA: Insufficient documentation

## 2017-04-19 LAB — BASIC METABOLIC PANEL
BUN: 17 mg/dL (ref 6–23)
CHLORIDE: 105 meq/L (ref 96–112)
CO2: 28 mEq/L (ref 19–32)
Calcium: 9.2 mg/dL (ref 8.4–10.5)
Creatinine, Ser: 0.99 mg/dL (ref 0.40–1.20)
GFR: 75.6 mL/min (ref >60.00)
Glucose, Bld: 123 mg/dL — ABNORMAL HIGH (ref 70–99)
POTASSIUM: 3.3 meq/L — AB (ref 3.5–5.1)
Sodium: 139 mEq/L (ref 135–145)

## 2017-04-19 MED ORDER — SUPREP BOWEL PREP KIT 17.5-3.13-1.6 GM/177ML PO SOLN: 1.0000 | Freq: Once | ORAL | 0 refills | Status: AC

## 2017-04-19 MED ORDER — MELOXICAM 7.5 MG PO TABS: 7.5000 mg | ORAL_TABLET | Freq: Every day | ORAL | 0 refills | Status: DC

## 2017-04-19 MED ORDER — POTASSIUM CHLORIDE CRYS ER 20 MEQ PO TBCR: 20.0000 meq | EXTENDED_RELEASE_TABLET | Freq: Every day | ORAL | 5 refills | Status: DC

## 2017-04-19 NOTE — Progress Notes (Signed)
Subjective:    Patient ID: Monique Wiggins, female    DOB: [birth year], 51 y.o.   MRN: [id number]  HPI  Patient is a 51 yr old female who presents today for follow up.  HTN- Last visit she noted increased LE edema. We made the following recommendations:  Decrease amlodipine form 72m to 537mdue to swelling and chang hyzaar from 100-12.5 to 100-2585mnce daily. She was continued on coreg. Since that time she notes resolution of her LE edema. BP Readings from Last 3 Encounters:  04/19/17 130/78  02/19/17 130/83  01/14/17 (!) 152/88   Back pain- reports right sided low back pain which has been present x 4-5 months. Notes that she got a new mattress but this has not helped her pain. Chart review reveals that she had ED visit for the same back in 2/18.  She was treated with robaxin and naproxen. Reports pain is non radiating.  She has some intermittent sciatic nerve pain in the left (not present currently).       Review of Systems See HPI  Past Medical History:  Diagnosis Date  . Allergy   . [place]  . H. pylori infection    1997  . Heart murmur   . Hypertension   . Hypertriglyceridemia 02/22/2017  . Hypertriglyceridemia 02/22/2017  . Migraines   . [facility]    Social History   Social History  . Marital status: Married    Spouse name: N/A  . Number of children: N/A  . Years of education: N/A   Occupational History  . Not on file.   Social History Main Topics  . Smoking status: Never Smoker  . Smokeless tobacco: Never Used  . Alcohol use No     Comment: never  . Drug use: No  . Sexual activity: Not on file   Other Topics Concern  . Not on file   Social History Narrative   ** Merged History Encounter **       Married 5 children (4 boys and 1 girl) All grown, has one daughter in 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 ABDOMINAL HYSTERECTOMY    . TOE SURGERY    . TONSILLECTOMY    . tummy tuck      Family History  Problem Relation Age of Onset  . Diabetes type II Mother   . Diabetes Mellitus II Father   . Pulmonary fibrosis Father   . Hypertension Sister   . Diabetes Sister   . Colon cancer Neg Hx   . Esophageal cancer Neg Hx   . Pancreatic cancer Neg Hx   . Stomach cancer Neg Hx     Allergies  Allergen Reactions  . Pollen Extract Swelling  . Shellfish Allergy Swelling    Current Outpatient Prescriptions on File Prior to Visit  Medication Sig Dispense Refill  . amLODipine (NORVASC) 5 MG tablet Take 1 tablet (5 mg total) by mouth daily. 30 tablet 3  . carvedilol (COREG) 25 MG tablet Take 25 mg by mouth 2 (two) times daily with a meal.    . cetirizine (ZYRTEC) 10 MG tablet Take 1 tablet (10 mg total) by mouth daily.  30 tablet 11  . ibuprofen (ADVIL,MOTRIN) 200 MG tablet Take 800 mg by mouth every 6 (six) hours as needed for mild pain.    Marland Kitchen losartan-hydrochlorothiazide (HYZAAR) 100-25 MG tablet Take 1 tablet by mouth daily. 30 tablet 3  . methocarbamol (ROBAXIN) 500 MG tablet Take 1 tablet (500 mg total) by mouth at bedtime as needed for muscle spasms. 20 tablet 0  . mometasone (ELOCON) 0.1 % cream Apply 1 application topically daily.    . naproxen (NAPROSYN) 500 MG tablet Take 1 tablet (500 mg total) by mouth 2 (two) times daily with a meal. 30 tablet 0  . ondansetron (ZOFRAN) 4 MG tablet Take 1 tablet (4 mg total) by mouth every 6 (six) hours. 12 tablet 0  . potassium chloride SA (K-DUR,KLOR-CON) 20 MEQ tablet Take 1 tablet (20 mEq total) by mouth daily. 30 tablet 3  . SUPREP BOWEL PREP KIT 17.5-3.13-1.6 GM/177ML SOLN Take 1 kit by mouth once. 354 mL 0  . topiramate (TOPAMAX) 50 MG tablet Take 50 mg by mouth daily.    Marland Kitchen topiramate  (TOPAMAX) 50 MG tablet Take 50 mg by mouth daily.     No current facility-administered medications on file prior to visit.     BP 130/78 (BP Location: Right Arm, Cuff Size: Normal)   Pulse 65   Temp 98.4 F (36.9 C) (Oral)   Resp 16   Ht 5' 2"  (1.575 m)   Wt 191 lb 12.8 oz (87 kg)   SpO2 98%   BMI 35.08 kg/m       Objective:   Physical Exam  Constitutional: She is oriented to person, place, and time. She appears well-developed and well-nourished.  HENT:  Head: Normocephalic and atraumatic.  Cardiovascular: Normal rate, regular rhythm and normal heart sounds.   No murmur heard. Pulmonary/Chest: Effort normal and breath sounds normal. No respiratory distress. She has no wheezes.  Musculoskeletal: She exhibits no edema.       Cervical back: She exhibits no tenderness.       Thoracic back: She exhibits no tenderness.       Lumbar back: She exhibits no tenderness.  Mild scoliosis noted  Neurological: She is alert and oriented to person, place, and time.  Reflex Scores:      Patellar reflexes are 1+ on the right side and 1+ on the left side. Bilateral LE strength is 5/5  Psychiatric: She has a normal mood and affect. Her behavior is normal. Judgment and thought content normal.          Assessment & Plan:  Right sided low back pain- obtain x ray, add meloxicam, refer to PT.  Advised follow up in 6 weeks.   HTN- BP stable, edema resolved. Continue current meds.

## 2017-04-19 NOTE — Telephone Encounter (Signed)
Please confirm with patient that she is taking potassium supplement daily.  I would like for her to take 2 tablets by mouth today and then 1 tablet daily.  Repeat basic metabolic panel in 1 week diagnosis hypokalemia.

## 2017-04-19 NOTE — Addendum Note (Signed)
Addended by: Kelle Darting A on: 04/19/2017 12:12 PM   Modules accepted: Orders

## 2017-04-19 NOTE — Progress Notes (Signed)
No allergies to eggs or soy No past problems with anesthesia No diet meds No home oxygen  Registered emmi

## 2017-04-19 NOTE — Patient Instructions (Signed)
Please complete lab work prior to leaving. Complete xray on the first floor. Begin meloxicam (anti-inflammatory) for low back pain. Do not take ibuprofen while taking meloxicam. You will be contacted about your referral to Physical therapy- please let us know if you have not heard back in 1 week about this.

## 2017-04-19 NOTE — Telephone Encounter (Signed)
Notified pt. States she may have been out of potassium x 1 week prior to today. Scheduled lab appt for 04/26/17 at 7am and future order entered.

## 2017-04-21 ENCOUNTER — Telehealth: Payer: Self-pay | Admitting: Family

## 2017-04-21 DIAGNOSIS — M545 Low back pain, unspecified: Secondary | ICD-10-CM

## 2017-04-21 DIAGNOSIS — G8929 Other chronic pain: Secondary | ICD-10-CM

## 2017-04-21 NOTE — Telephone Encounter (Signed)
See mychart.  

## 2017-04-22 ENCOUNTER — Telehealth: Payer: Self-pay | Admitting: Family

## 2017-04-22 NOTE — Telephone Encounter (Signed)
Please call pt re: unread mychart message

## 2017-04-23 ENCOUNTER — Encounter: Payer: Self-pay | Admitting: Gastroenterology

## 2017-04-24 NOTE — Telephone Encounter (Signed)
Pt has read message:  Debbrah Alar, NP  to [place], [place]     04/21/17 8:45 AM  [place],   I reviewed the results of your spine x-ray. 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 I still think that physical therapy would help you but I am also going to place a referral for you to meet with an orthopedic doctor who specializes in spines. You should be contacted about this appointment.    Take Care,   Debbrah Alar NP    Last read by Manuella Ghazi at 10:22 AM on 04/22/2017.

## 2017-04-25 ENCOUNTER — Telehealth: Payer: Self-pay | Admitting: Family

## 2017-04-25 DIAGNOSIS — E876 Hypokalemia: Secondary | ICD-10-CM

## 2017-04-25 NOTE — Telephone Encounter (Signed)
Patient is calling in reference to lab work. Patient canceled appointment for tomorrow 04/25/17. Patient would like to have labs done at the [facility] location since she works closer to this location. Please contact patient to advise this has been done.

## 2017-04-26 ENCOUNTER — Other Ambulatory Visit (INDEPENDENT_AMBULATORY_CARE_PROVIDER_SITE_OTHER): Payer: 59

## 2017-04-26 ENCOUNTER — Other Ambulatory Visit: Payer: 59

## 2017-04-26 DIAGNOSIS — E876 Hypokalemia: Secondary | ICD-10-CM | POA: Diagnosis not present

## 2017-04-26 LAB — BASIC METABOLIC PANEL
BUN: 20 mg/dL (ref 6–23)
CALCIUM: 9.8 mg/dL (ref 8.4–10.5)
CO2: 26 meq/L (ref 19–32)
Chloride: 103 mEq/L (ref 96–112)
Creatinine, Ser: 1.01 mg/dL (ref 0.40–1.20)
GFR: 73.87 mL/min (ref >60.00)
GLUCOSE: 86 mg/dL (ref 70–99)
POTASSIUM: 3.6 meq/L (ref 3.5–5.1)
Sodium: 139 mEq/L (ref 135–145)

## 2017-04-26 NOTE — Telephone Encounter (Signed)
Order re-entered for [facility] and detailed message left on pt's voicemail and to call if any questions.

## 2017-05-02 ENCOUNTER — Telehealth: Payer: Self-pay | Admitting: Gastroenterology

## 2017-05-02 MED ORDER — SUPREP BOWEL PREP KIT 17.5-3.13-1.6 GM/177ML PO SOLN
1.0000 | Freq: Once | ORAL | 0 refills | Status: AC
Start: 2017-05-02 — End: 2017-05-02

## 2017-05-02 NOTE — Telephone Encounter (Signed)
Pt states [facility] is requiring prior authorization and cannot pick up medication.

## 2017-05-02 NOTE — Telephone Encounter (Signed)
Resent [facility] order for screening colonoscopy, S [facility] to [facility] on [address].  Pt's name is not hyphenated on this chart in Epic so it shouldn't show for order that way.  Called pt and made her aware. Angela/PV

## 2017-05-02 NOTE — Telephone Encounter (Signed)
Notified patient's husband states he will let patient know we have a sample for her to pick up on the fourth floor at the front desk.

## 2017-05-03 ENCOUNTER — Encounter: Payer: Self-pay | Admitting: Gastroenterology

## 2017-05-03 ENCOUNTER — Ambulatory Visit (AMBULATORY_SURGERY_CENTER): Payer: 59 | Admitting: Gastroenterology

## 2017-05-03 VITALS — BP 135/74 | HR 61 | Temp 97.8°F | Resp 15 | Ht 61.0 in | Wt 190.0 lb

## 2017-05-03 DIAGNOSIS — D123 Benign neoplasm of transverse colon: Secondary | ICD-10-CM | POA: Diagnosis not present

## 2017-05-03 DIAGNOSIS — D120 Benign neoplasm of cecum: Secondary | ICD-10-CM | POA: Diagnosis not present

## 2017-05-03 DIAGNOSIS — D128 Benign neoplasm of rectum: Secondary | ICD-10-CM

## 2017-05-03 DIAGNOSIS — Z1212 Encounter for screening for malignant neoplasm of rectum: Secondary | ICD-10-CM

## 2017-05-03 DIAGNOSIS — Z1211 Encounter for screening for malignant neoplasm of colon: Secondary | ICD-10-CM

## 2017-05-03 DIAGNOSIS — K635 Polyp of colon: Secondary | ICD-10-CM

## 2017-05-03 MED ORDER — SODIUM CHLORIDE 0.9 % IV SOLN: 500.0000 mL | INTRAVENOUS | Status: DC

## 2017-05-03 NOTE — Patient Instructions (Signed)
Handouts given : Polyps and Diverticulosis.  YOU HAD AN ENDOSCOPIC PROCEDURE TODAY AT THE [facility]:   Refer to the procedure report that was given to you for any specific questions about what was found during the examination.  If the procedure report does not answer your questions, please call your gastroenterologist to clarify.  If you requested that your care partner not be given the details of your procedure findings, then the procedure report has been included in a sealed envelope for you to review at your convenience later.  YOU SHOULD EXPECT: Some feelings of bloating in the abdomen. Passage of more gas than usual.  Walking can help get rid of the air that was put into your GI tract during the procedure and reduce the bloating. If you had a lower endoscopy (such as a colonoscopy or flexible sigmoidoscopy) you may notice spotting of blood in your stool or on the toilet paper. If you underwent a bowel prep for your procedure, you may not have a normal bowel movement for a few days.  Please Note:  You might notice some irritation and congestion in your nose or some drainage.  This is from the oxygen used during your procedure.  There is no need for concern and it should clear up in a day or so.  SYMPTOMS TO REPORT IMMEDIATELY:   Following lower endoscopy (colonoscopy or flexible sigmoidoscopy):  Excessive amounts of blood in the stool  Significant tenderness or worsening of abdominal pains  Swelling of the abdomen that is new, acute  Fever of 100F or higher    For urgent or emergent issues, a gastroenterologist can be reached at any hour by calling [phone].   DIET:  We do recommend a small meal at first, but then you may proceed to your regular diet.  Drink plenty of fluids but you should avoid alcoholic beverages for 24 hours.  ACTIVITY:  You should plan to take it easy for the rest of today and you should NOT DRIVE or use heavy machinery until tomorrow (because of  the sedation medicines used during the test).    FOLLOW UP: Our staff will call the number listed on your records the next business day following your procedure to check on you and address any questions or concerns that you may have regarding the information given to you following your procedure. If we do not reach you, we will leave a message.  However, if you are feeling well and you are not experiencing any problems, there is no need to return our call.  We will assume that you have returned to your regular daily activities without incident.  If any biopsies were taken you will be contacted by phone or by letter within the next 1-3 weeks.  Please call us at [phone] if you have not heard about the biopsies in 3 weeks.    SIGNATURES/CONFIDENTIALITY: You and/or your care partner have signed paperwork which will be entered into your electronic medical record.  These signatures attest to the fact that that the information above on your After Visit Summary has been reviewed and is understood.  Full responsibility of the confidentiality of this discharge information lies with you and/or your care-partner. 

## 2017-05-03 NOTE — Progress Notes (Signed)
Report given to PACU, vss 

## 2017-05-03 NOTE — Op Note (Signed)
[facility] Patient Name: Monique Wiggins Procedure Date: 05/03/2017 10:19 AM MRN: [id number] Endoscopist: Remo Lipps P. Armbruster MD, MD Age: 51 Referring MD:  Date of Birth: 1965-08-11 Gender: Female Account #: [id number] Procedure:                Colonoscopy Indications:              Screening for colorectal malignant neoplasm, This                            is the patient's first colonoscopy Medicines:                Monitored Anesthesia Care Procedure:                Pre-Anesthesia Assessment:                           - Prior to the procedure, a History and Physical                            was performed, and patient medications and                            allergies were reviewed. The patient's tolerance of                            previous anesthesia was also reviewed. The risks                            and benefits of the procedure and the sedation                            options and risks were discussed with the patient.                            All questions were answered, and informed consent                            was obtained. Prior Anticoagulants: The patient has                            taken no previous anticoagulant or antiplatelet                            agents. ASA Grade Assessment: II - A patient with                            mild systemic disease. After reviewing the risks                            and benefits, the patient was deemed in                            satisfactory condition to undergo the procedure.  After obtaining informed consent, the colonoscope                            was passed under direct vision. Throughout the                            procedure, the patient's blood pressure, pulse, and                            oxygen saturations were monitored continuously. The                            Colonoscope was introduced through the anus and                            advanced to the  the terminal ileum, with                            identification of the appendiceal orifice and IC                            valve. The colonoscopy was performed without                            difficulty. The patient tolerated the procedure                            well. The quality of the bowel preparation was                            good. The terminal ileum, ileocecal valve,                            appendiceal orifice, and rectum were photographed. Scope In: 10:23:40 AM Scope Out: 10:41:39 AM Scope Withdrawal Time: 0 hours 15 minutes 58 seconds  Total Procedure Duration: 0 hours 17 minutes 59 seconds  Findings:                 The perianal and digital rectal examinations were                            normal.                           The terminal ileum appeared normal.                           A 4 mm polyp was found in the transverse colon. The                            polyp was flat. The polyp was removed with a cold                            snare. Resection and retrieval were complete.  Two sessile polyps were found in the recto-sigmoid                            colon. The polyps were 3 to 4 mm in size. These                            polyps were removed with a cold snare. Resection                            and retrieval were complete.                           A few small-mouthed diverticula were found in the                            ascending colon.                           The exam was otherwise without abnormality. Complications:            No immediate complications. Estimated blood loss:                            Minimal. Estimated Blood Loss:     Estimated blood loss was minimal. Impression:               - The examined portion of the ileum was normal.                           - One 4 mm polyp in the transverse colon, removed                            with a cold snare. Resected and retrieved.                            - Two 3 to 4 mm polyps at the recto-sigmoid colon,                            removed with a cold snare. Resected and retrieved.                           - Diverticulosis in the ascending colon.                           - The examination was otherwise normal. Recommendation:           - Patient has a contact number available for                            emergencies. The signs and symptoms of potential                            delayed complications were discussed with the  patient. Return to normal activities tomorrow.                            Written discharge instructions were provided to the                            patient.                           - Resume previous diet.                           - Continue present medications.                           - Await pathology results.                           - Repeat colonoscopy is recommended for                            surveillance. The colonoscopy date will be                            determined after pathology results from today's                            exam become available for review. Remo Lipps P. Armbruster MD, MD 05/03/2017 10:45:42 AM This report has been signed electronically.

## 2017-05-03 NOTE — Progress Notes (Signed)
Called to room to assist during endoscopic procedure.  Patient ID and intended procedure confirmed with present staff. Received instructions for my participation in the procedure from the performing physician.  

## 2017-05-06 ENCOUNTER — Telehealth: Payer: Self-pay

## 2017-05-06 NOTE — Telephone Encounter (Signed)
  Follow up Call-  Call Jailey Booton number 05/03/2017  Post procedure Call Gentry Seeber phone  # [phone]  Permission to leave phone message Yes     Patient questions:  Do you have a fever, pain , or abdominal swelling? No. Pain Score  0 *  Have you tolerated food without any problems? Yes.    Have you been able to return to your normal activities? Yes.    Do you have any questions about your discharge instructions: Diet   No. Medications  No. Follow up visit  No.  Do you have questions or concerns about your Care? No.  Actions: * If pain score is 4 or above: No action needed, pain <4.

## 2017-05-08 ENCOUNTER — Encounter: Payer: Self-pay | Admitting: Gastroenterology

## 2017-05-13 ENCOUNTER — Telehealth: Payer: Self-pay | Admitting: Gastroenterology

## 2017-05-13 NOTE — Telephone Encounter (Signed)
Routed to DOD, Dr. Havery Moros patient, patient has not had complete bowel movement since 05/03/17. She has not tried any OTC laxatives. She is very bloated, nauseated. Please advise.

## 2017-05-13 NOTE — Telephone Encounter (Signed)
Tried calling patient back x 2, phone not working properly, unable to lvm. Will try later.

## 2017-05-14 NOTE — Telephone Encounter (Signed)
Unable to reach patient at her work, spoke to husband as she requested. She is passing gas, some stool and some nausea, denies any vomiting. Instructed to get the Miralax and use it twice daily, triturating up or down as needed to have one complete bm daily. She is also going to call back to schedule a follow/establish visit in office.

## 2017-05-14 NOTE — Telephone Encounter (Signed)
Almyra Free could you please help clarify a few things: - is she passing gas and some stool, just isn't back to normal bowel habits yet, is that correct? - no nausea / vomiting, etc?  If only constipation, recommend Miralax dosed at 17gm twice daily until she is having one bowel movement daily. She can then titrate her dose up or down as needed.   If she isn't passing gas / stool, having nausea / vomiting, intolerant of PO etc, please let me know but that does not appear to be the case.   Please let her know sometimes it can take a week or 2 to get bowels back to normal function after a colonoscopy.

## 2017-05-14 NOTE — Telephone Encounter (Signed)
Monique Wiggins,   I saw this after hours.  Could you attend to this today? - HD

## 2017-05-20 DIAGNOSIS — S335XXA Sprain of ligaments of lumbar spine, initial encounter: Secondary | ICD-10-CM | POA: Diagnosis not present

## 2017-05-20 DIAGNOSIS — M5432 Sciatica, left side: Secondary | ICD-10-CM | POA: Diagnosis not present

## 2017-05-20 DIAGNOSIS — M4316 Spondylolisthesis, lumbar region: Secondary | ICD-10-CM | POA: Diagnosis not present

## 2017-05-31 ENCOUNTER — Ambulatory Visit: Payer: 59 | Admitting: Family

## 2017-06-01 DIAGNOSIS — S335XXA Sprain of ligaments of lumbar spine, initial encounter: Secondary | ICD-10-CM | POA: Diagnosis not present

## 2017-06-04 ENCOUNTER — Telehealth: Payer: Self-pay | Admitting: Physical Therapy

## 2017-06-04 DIAGNOSIS — M4316 Spondylolisthesis, lumbar region: Secondary | ICD-10-CM | POA: Diagnosis not present

## 2017-06-04 DIAGNOSIS — M5137 Other intervertebral disc degeneration, lumbosacral region: Secondary | ICD-10-CM | POA: Diagnosis not present

## 2017-06-04 DIAGNOSIS — M545 Low back pain: Secondary | ICD-10-CM | POA: Diagnosis not present

## 2017-06-04 NOTE — Telephone Encounter (Signed)
04/25/17 patient states she wants [facility] location, referral sent to their [employer]

## 2017-06-07 ENCOUNTER — Telehealth: Payer: Self-pay | Admitting: Family

## 2017-06-07 ENCOUNTER — Telehealth: Payer: Self-pay | Admitting: *Deleted

## 2017-06-07 DIAGNOSIS — G8929 Other chronic pain: Secondary | ICD-10-CM

## 2017-06-07 DIAGNOSIS — M545 Low back pain: Principal | ICD-10-CM

## 2017-06-07 NOTE — Telephone Encounter (Signed)
See mychart.  

## 2017-06-07 NOTE — Telephone Encounter (Signed)
See additional phone note from 06/07/17.  Copied from [place] [phone] [id number]. Topic: Referral - Request >> Jun 05, 2017  3:14 PM Carolyn Stare wrote:  Pt saw orthopediac Dr Maxie Better  for back slip disc and is wanting a  2nd opinion so she called today to ask for a referral to see Dr Rita Ohara the neurologist on [address]  [phone] or [phone] this is her husband number

## 2017-07-23 ENCOUNTER — Ambulatory Visit (INDEPENDENT_AMBULATORY_CARE_PROVIDER_SITE_OTHER): Payer: No Typology Code available for payment source | Admitting: Family

## 2017-07-23 ENCOUNTER — Encounter: Payer: Self-pay | Admitting: Family

## 2017-07-23 VITALS — BP 133/87 | HR 76 | Resp 16 | Ht 62.0 in | Wt 196.0 lb

## 2017-07-23 DIAGNOSIS — I10 Essential (primary) hypertension: Secondary | ICD-10-CM | POA: Diagnosis not present

## 2017-07-23 DIAGNOSIS — M545 Low back pain: Secondary | ICD-10-CM

## 2017-07-23 DIAGNOSIS — L309 Dermatitis, unspecified: Secondary | ICD-10-CM | POA: Diagnosis not present

## 2017-07-23 MED ORDER — AMLODIPINE BESYLATE 5 MG PO TABS: 5.0000 mg | ORAL_TABLET | Freq: Every day | ORAL | 5 refills | Status: DC

## 2017-07-23 MED ORDER — CARVEDILOL 25 MG PO TABS: 25.0000 mg | ORAL_TABLET | Freq: Two times a day (BID) | ORAL | 5 refills | Status: DC

## 2017-07-23 MED ORDER — TRETINOIN 0.025 % EX CREA: TOPICAL_CREAM | Freq: Every day | CUTANEOUS | 2 refills | Status: DC

## 2017-07-23 MED ORDER — MOMETASONE FUROATE 0.1 % EX CREA: 1.0000 "application " | TOPICAL_CREAM | Freq: Every day | CUTANEOUS | 2 refills | Status: DC

## 2017-07-23 MED ORDER — LOSARTAN POTASSIUM-HCTZ 100-25 MG PO TABS: 1.0000 | ORAL_TABLET | Freq: Every day | ORAL | 5 refills | Status: DC

## 2017-07-23 MED FILL — LOSARTAN-HCTZ 100-25 MG TAB: 100-25 | 30 days supply | Qty: 30 | Fill #0

## 2017-07-23 MED FILL — AMLODIPINE BESYLATE 5 MG TA: 5 | 90 days supply | Qty: 90 | Fill #0

## 2017-07-23 MED FILL — MOMETASONE FUROATE 0.1% CRM: 0.1 | 30 days supply | Qty: 45 | Fill #0

## 2017-07-23 MED FILL — CARVEDILOL 25 MG TABS: 25 | 90 days supply | Qty: 180 | Fill #0

## 2017-07-23 NOTE — Progress Notes (Signed)
Subjective:    Patient ID: Monique Wiggins, female    DOB: 16-Mar-1966, 52 y.o.   MRN: [id number]  HPI   Patient is a 52 year old female who presents today for follow-up of her low back pain.  She was last seen on April 19, 2017.  At that time she reported a 4-80-month history of right-sided low back pain. She was placed on meloxicam and underwent an x-ray of her back.  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  She is also incidentally noted to have a transitional lumbosacral vertebrae designated S1.  Reports that she saw Dr. Sherwood Gambler this AM and reports that she has been scheduled for [facility].   HTN-  maintained on amlodipine, coreg, hyzaar.   BP Readings from Last 3 Encounters:  07/23/17 133/87  05/03/17 135/74  04/19/17 130/78   Dermatitis- reports retin-A is the only think that helps the rash on her back. Has intermittent eczema for which she uses steroid cream. Requests refills on both.   Past Medical History:  Diagnosis Date  . Allergy   . DVT (deep venous thrombosis) ([place])    after long car trip  . H. pylori infection    1997  . Heart murmur   . Hypertension   . Hypertriglyceridemia 02/22/2017  . Hypertriglyceridemia 02/22/2017  . Migraines   . Sickle cell trait ([place])      Social History   Socioeconomic History  . Marital status: Married    Spouse name: Not on file  . Number of children: Not on file  . Years of education: Not on file  . Highest education level: Not on file  Social Needs  . Financial resource strain: Not on file  . Food insecurity - worry: Not on file  . Food insecurity - inability: Not on file  . Transportation needs - medical: Not on file  . Transportation needs - non-medical: Not on file  Occupational History  . Not on file  Tobacco Use  . Smoking status: Never Smoker  . Smokeless tobacco: Never Used  Substance and Sexual Activity  .  Alcohol use: No    Comment: never  . Drug use: No  . Sexual activity: Not on file  Other Topics Concern  . 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 ABDOMINAL HYSTERECTOMY    . TOE SURGERY    . TONSILLECTOMY    . tummy tuck      Family History  Problem Relation Age of Onset  . Diabetes type II Mother   . Diabetes Mellitus II Father   . Pulmonary fibrosis Father   . Hypertension Sister   . Diabetes Sister   . Colon cancer Neg Hx   . Esophageal cancer Neg Hx   . Pancreatic cancer Neg Hx   . Stomach cancer Neg Hx     Allergies  Allergen Reactions  . Pollen Extract Swelling  . Shellfish Allergy Swelling    Current Outpatient Medications on File Prior to Visit  Medication Sig Dispense Refill  . amLODipine (NORVASC) 5 MG tablet Take 1 tablet (5 mg total) by mouth daily. 30 tablet 3  . carvedilol (COREG) 25 MG tablet Take 25 mg by mouth 2 (two) times daily with a meal.    . cetirizine (ZYRTEC) 10 MG tablet Take 1 tablet (10 mg total) by mouth daily. 30 tablet 11  . ibuprofen (ADVIL,MOTRIN) 200 MG tablet Take 800 mg by mouth every 6 (six) hours as needed for mild pain.    Marland Kitchen losartan-hydrochlorothiazide (HYZAAR) 100-25 MG tablet Take 1 tablet by mouth daily. 30 tablet 3  . meloxicam (MOBIC) 7.5 MG tablet Take 1 tablet (7.5 mg total) by mouth daily. 14 tablet 0  . methocarbamol (ROBAXIN) 500 MG tablet Take 1 tablet (500 mg total) by mouth at bedtime as needed for muscle spasms. 20 tablet 0  . mometasone (ELOCON) 0.1 % cream Apply 1 application topically daily.    . naproxen (NAPROSYN) 500 MG tablet Take 1  tablet (500 mg total) by mouth 2 (two) times daily with a meal. 30 tablet 0  . ondansetron (ZOFRAN) 4 MG tablet Take 1 tablet (4 mg total) by mouth every 6 (six) hours. 12 tablet 0  . potassium chloride SA (K-DUR,KLOR-CON) 20 MEQ tablet Take 1 tablet (20 mEq total) by mouth daily. 30 tablet 5  . topiramate (TOPAMAX) 50 MG tablet Take 50 mg by mouth daily.    Marland Kitchen topiramate (TOPAMAX) 50 MG tablet Take 50 mg by mouth daily.     No current facility-administered medications on file prior to visit.     BP 133/87 (BP Location: Right Arm, Patient Position: Sitting, Cuff Size: Normal)   Pulse 76   Resp 16   Ht 5\' 2"  (1.575 m)   Wt 196 lb (88.9 kg)   SpO2 100%   BMI 35.85 kg/m        Objective:   Physical Exam  Constitutional: She is oriented to person, place, and time. She appears well-developed and well-nourished.  HENT:  Head: Normocephalic and atraumatic.  Cardiovascular: Normal rate, regular rhythm and normal heart sounds.  No murmur heard. Pulmonary/Chest: Effort normal and breath sounds normal. No respiratory distress. She has no wheezes.  Musculoskeletal: She exhibits no edema.  Neurological: She is alert and oriented to person, place, and time.  Psychiatric: She has a normal mood and affect. Her behavior is normal. Judgment and thought content normal.  skin: hyperpigmented raised skin noted on mid back.        Assessment & Plan:  HTN- stable on current meds.  Low back pain- uncontrolled- management per neurosurgery.  Dermatitits- requests refill on steroid cream and retin a

## 2017-07-24 ENCOUNTER — Telehealth: Payer: Self-pay

## 2017-07-24 NOTE — Telephone Encounter (Signed)
PA initiated via Covermymeds; KEY: [place]. Awaiting determination.

## 2017-07-26 MED FILL — TRETINOIN 0.025% CREAM: 0.025 | 30 days supply | Qty: 45 | Fill #0

## 2017-07-29 NOTE — Telephone Encounter (Signed)
PA approved- effective 07/26/2017 through 07/25/2018.

## 2017-08-27 ENCOUNTER — Other Ambulatory Visit: Payer: Self-pay | Admitting: Family

## 2017-08-27 MED FILL — LOSARTAN POTASSIUM-HCTZ 100: 100-25 | 30 days supply | Qty: 30 | Fill #1

## 2017-08-27 MED FILL — POTASSIUM CL ER 20 MEQ TABL: 20 | 30 days supply | Qty: 30 | Fill #0

## 2017-08-27 NOTE — Telephone Encounter (Signed)
Copied from [place] [id number]. Topic: Quick Communication - Rx Refill/Question >> Aug 27, 2017  2:16 PM Retina Bernardy, Doylene Canard E, NT wrote: Medication:  topiramate (TOPAMAX) 50 MG tablet and potassium chloride SA (K-DUR,KLOR-CON) 20 MEQ tablet Has the patient contacted their pharmacy? Yes    (Agent: If no, request that the patient contact the pharmacy for the refill.)   Preferred Pharmacy (with phone number or street name): [place], [place] - [place] [phone] (Phone) [phone] (Fax)     Agent: Please be advised that RX refills may take up to 3 business days. We ask that you follow-up with your pharmacy.

## 2017-08-28 MED ORDER — POTASSIUM CHLORIDE CRYS ER 20 MEQ PO TBCR: 20.0000 meq | EXTENDED_RELEASE_TABLET | Freq: Every day | ORAL | 5 refills | Status: DC

## 2017-08-28 NOTE — Telephone Encounter (Signed)
LOV 02/19/17 with [place] [place] [facility] [employer].

## 2017-08-28 NOTE — Telephone Encounter (Signed)
Potassium refill sent to pharmacy. Please advise on topamax RX as I do not see that we have refilled this for pt before?

## 2017-08-29 MED ORDER — TOPIRAMATE 50 MG PO TABS: 50.0000 mg | ORAL_TABLET | Freq: Every day | ORAL | 5 refills | Status: DC

## 2017-08-29 MED FILL — TOPIRAMATE 50 MG TABLET: 50 | 30 days supply | Qty: 30 | Fill #0

## 2017-09-26 ENCOUNTER — Encounter (INDEPENDENT_AMBULATORY_CARE_PROVIDER_SITE_OTHER): Payer: Self-pay

## 2017-09-30 ENCOUNTER — Ambulatory Visit (INDEPENDENT_AMBULATORY_CARE_PROVIDER_SITE_OTHER): Payer: Self-pay | Admitting: Family Medicine

## 2017-09-30 MED FILL — TOPIRAMATE 50 MG TABLET: 50 | 30 days supply | Qty: 30 | Fill #1

## 2017-09-30 MED FILL — LOSARTAN POTASSIUM-HCTZ 100: 100-25 | 30 days supply | Qty: 30 | Fill #2

## 2017-09-30 MED FILL — POTASSIUM CL ER 20 MEQ TABL: 20 | 30 days supply | Qty: 30 | Fill #1

## 2017-11-06 ENCOUNTER — Other Ambulatory Visit: Payer: Self-pay | Admitting: Family

## 2017-11-06 MED FILL — POTASSIUM CL ER 20 MEQ TABL: 20 | 30 days supply | Qty: 30 | Fill #2

## 2017-11-06 MED FILL — LOSARTAN POTASSIUM-HCTZ 100: 100-25 | 30 days supply | Qty: 30 | Fill #3

## 2017-11-06 MED FILL — CARVEDILOL 25 MG TABS: 25 | 14 days supply | Qty: 28 | Fill #0

## 2017-11-06 MED FILL — TOPIRAMATE 50 MG TABLET: 50 | 30 days supply | Qty: 30 | Fill #2

## 2017-11-06 MED FILL — AMLODIPINE BESYLATE 5 MG TA: 5 | 90 days supply | Qty: 90 | Fill #1

## 2017-11-06 NOTE — Telephone Encounter (Signed)
2 week supply of carvedilol sent to pharmacy. Our records indicate pt has not been seen by PCP since 10/2016 and is past due for follow up. Pt will need to be seen for further refills.  Mychart message sent to pt.

## 2017-11-20 ENCOUNTER — Encounter (INDEPENDENT_AMBULATORY_CARE_PROVIDER_SITE_OTHER): Payer: Self-pay

## 2017-11-22 ENCOUNTER — Ambulatory Visit: Payer: No Typology Code available for payment source | Admitting: Family

## 2017-11-27 ENCOUNTER — Ambulatory Visit: Payer: No Typology Code available for payment source | Admitting: Family

## 2017-11-28 ENCOUNTER — Ambulatory Visit (INDEPENDENT_AMBULATORY_CARE_PROVIDER_SITE_OTHER): Payer: No Typology Code available for payment source | Admitting: Medical

## 2017-11-28 ENCOUNTER — Encounter: Payer: Self-pay | Admitting: Medical

## 2017-11-28 VITALS — BP 132/75 | HR 65 | Temp 97.9°F | Resp 16 | Ht 62.0 in | Wt 198.0 lb

## 2017-11-28 DIAGNOSIS — R739 Hyperglycemia, unspecified: Secondary | ICD-10-CM | POA: Diagnosis not present

## 2017-11-28 DIAGNOSIS — G4700 Insomnia, unspecified: Secondary | ICD-10-CM | POA: Diagnosis not present

## 2017-11-28 DIAGNOSIS — R350 Frequency of micturition: Secondary | ICD-10-CM

## 2017-11-28 DIAGNOSIS — R5383 Other fatigue: Secondary | ICD-10-CM | POA: Diagnosis not present

## 2017-11-28 DIAGNOSIS — F439 Reaction to severe stress, unspecified: Secondary | ICD-10-CM | POA: Diagnosis not present

## 2017-11-28 DIAGNOSIS — H538 Other visual disturbances: Secondary | ICD-10-CM | POA: Diagnosis not present

## 2017-11-28 LAB — POC URINALSYSI DIPSTICK (AUTOMATED)
Bilirubin, UA: NEGATIVE
Blood, UA: NEGATIVE
GLUCOSE UA: NEGATIVE
Ketones, UA: NEGATIVE
LEUKOCYTES UA: NEGATIVE
Nitrite, UA: NEGATIVE
PROTEIN UA: POSITIVE — AB
Spec Grav, UA: 1.02 (ref 1.010–1.025)
UROBILINOGEN UA: 0.2 U/dL
pH, UA: 7 (ref 5.0–8.0)

## 2017-11-28 MED ORDER — TRAZODONE HCL 50 MG PO TABS: 25.0000 mg | ORAL_TABLET | Freq: Every evening | ORAL | 1 refills | Status: DC | PRN

## 2017-11-28 NOTE — Progress Notes (Signed)
Subjective:    Patient ID: Manuella Ghazi, female    DOB: 11/08/1965, 52 y.o.   MRN: [id number]  HPI  Pt in with red eyes for one month. Transient blurred vision. Wearing glasses since the end of last year. Last vision check was January. When wears glasses vision normalized. Maybe faint film sensation to both eye. No sneezing and no nasal congestion. Pt state bp controlled. She states not allergies.  Pt also reports very fatigued.Pt works 8 hours shifts 5 days a week. Pt states she get about 4 hours of sleep at night. Recent high stress.  Some urination at night more than usual. Some excess thirst.   Pt is in school 2 days a week. Husband about to go into surgery.  Pt has dermatographia and she is on benadryl.(pt took benadryl for 2 days). She does feel recently anxious due to high level stress.    Review of Systems  Constitutional: Positive for fatigue. Negative for chills and fever.  HENT: Negative for congestion, ear pain, facial swelling, mouth sores, nosebleeds, postnasal drip, rhinorrhea, sore throat and tinnitus.   Eyes: Positive for redness.       Transient blurred vision.  Respiratory: Negative for cough, chest tightness, shortness of breath and wheezing.   Cardiovascular: Negative for chest pain and palpitations.  Gastrointestinal: Negative for abdominal distention, abdominal pain and anal bleeding.  Endocrine: Positive for polydipsia.  Genitourinary: Positive for frequency. Negative for dyspareunia, dysuria and urgency.  Musculoskeletal: Negative for back pain.  Skin:       Itching.  Neurological: Negative for dizziness, tremors, facial asymmetry, weakness and headaches.  Hematological: Negative for adenopathy. Does not bruise/bleed easily.  Psychiatric/Behavioral: Negative for agitation, behavioral problems, confusion, decreased concentration, dysphoric mood and suicidal ideas. The patient is nervous/anxious.     Past Medical History:  Diagnosis Date  .  Allergy   . DVT (deep venous thrombosis) ([place])    after long car trip  . H. pylori infection    1997  . Heart murmur   . Hypertension   . Hypertriglyceridemia 02/22/2017  . Hypertriglyceridemia 02/22/2017  . Migraines   . Sickle cell trait ([place])      Social History   Socioeconomic History  . Marital status: Married    Spouse name: Not on file  . Number of children: Not on file  . Years of education: Not on file  . Highest education level: Not on file  Occupational History  . Not on file  Social Needs  . Financial resource strain: Not on file  . Food insecurity:    Worry: Not on file    Inability: Not on file  . Transportation needs:    Medical: Not on file    Non-medical: Not on file  Tobacco Use  . Smoking status: Never Smoker  . Smokeless tobacco: Never Used  Substance and Sexual Activity  . Alcohol use: No    Comment: never  . Drug use: No  . Sexual activity: Not on file  Lifestyle  . Physical activity:    Days per week: Not on file    Minutes per session: Not on file  . Stress: Not on file  Relationships  . Social connections:    Talks on phone: Not on file    Gets together: Not on file    Attends religious service: Not on file    Active member of club or organization: Not on file    Attends meetings of clubs or organizations: Not  on file    Relationship status: Not on file  . Intimate partner violence:    Fear of current or ex partner: Not on file    Emotionally abused: Not on file    Physically abused: Not on file    Forced sexual activity: Not on file  Other Topics Concern  . 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 ABDOMINAL HYSTERECTOMY    . TOE SURGERY    . TONSILLECTOMY    . tummy tuck      Family History  Problem Relation Age of Onset  . Diabetes type II Mother   . Diabetes Mellitus II Father   . Pulmonary fibrosis Father   . Hypertension Sister   . Diabetes Sister   . Colon cancer Neg Hx   . Esophageal cancer Neg Hx   . Pancreatic cancer Neg Hx   . Stomach cancer Neg Hx     Allergies  Allergen Reactions  . Pollen Extract Swelling  . Shellfish Allergy Swelling    Current Outpatient Medications on File Prior to Visit  Medication Sig Dispense Refill  . amLODipine (NORVASC) 5 MG tablet Take 1 tablet (5 mg total) by mouth daily. 30 tablet 5  . carvedilol (COREG) 25 MG tablet TAKE 1 TABLET BY MOUTH TWICE DAILY WITH MEALS 28 tablet 0  . cetirizine (ZYRTEC) 10 MG tablet Take 1 tablet (10 mg total) by mouth daily. 30 tablet 11  . ibuprofen (ADVIL,MOTRIN) 200 MG tablet Take 800 mg by mouth every 6 (six) hours as needed for mild pain.    Marland Kitchen losartan-hydrochlorothiazide (HYZAAR) 100-25 MG tablet Take 1 tablet by mouth daily. 30 tablet 5  . meloxicam (MOBIC) 7.5 MG tablet Take 1 tablet (7.5 mg total) by mouth daily. 14 tablet 0  . methocarbamol (ROBAXIN) 500 MG tablet Take 1 tablet (500 mg total) by mouth at bedtime as needed for muscle spasms. 20 tablet 0  . mometasone (ELOCON) 0.1 % cream Apply 1 application topically daily. 45 g 2  . naproxen (NAPROSYN) 500 MG tablet Take 1 tablet (500 mg total) by mouth 2 (two) times daily with a meal. 30 tablet 0  . potassium chloride SA (K-DUR,KLOR-CON) 20 MEQ tablet Take 1 tablet (20 mEq total) by mouth daily. 30 tablet 5  . topiramate (TOPAMAX) 50 MG tablet Take 1 tablet (50 mg total) by mouth daily. 30 tablet 5  . tretinoin (RETIN-A) 0.025 % cream Apply topically at bedtime. 45 g 2   No current facility-administered medications on file prior to visit.     BP 132/75   Pulse 65   Temp 97.9 F (36.6 C) (Oral)    Resp 16   Ht 5\' 2"  (1.575 m)   Wt 198 lb (89.8 kg)   SpO2 97%   BMI 36.21 kg/m       Objective:   Physical Exam  General Mental Status- Alert. General Appearance- Not in acute distress.   Eyes- mild red apperance. No dc. Mild exopthalmus appearance.  Skin General: Color- Normal Color. Moisture- Normal Moisture.  Neck Carotid  Arteries- Normal color. Moisture- Normal Moisture. No carotid bruits. No JVD. No thyromegaly.  Chest and Lung Exam Auscultation: Breath Sounds:-Normal.  Cardiovascular Auscultation:Rythm- Regular. Murmurs & Other Heart Sounds:Auscultation of the heart reveals- No Murmurs.  Abdomen Inspection:-Inspeection Normal. Palpation/Percussion:Note:No mass. Palpation and Percussion of the abdomen reveal- Non Tender, Non Distended + BS, no rebound or guarding.    Neurologic Cranial Nerve exam:- CN III-XII intact(No nystagmus), symmetric smile. Drift Test:- No drift. Romberg Exam:- Negative.  Heal to Toe Gait exam:-Normal. Finger to Nose:- Normal/Intact Strength:- 5/5 equal and symmetric strength both upper and lower extremities.  Back- no cva tenderness.      Assessment & Plan:  For your recent severe fatigue, I am ordering CMP, CBC, TSH, T4, vitamin D, B12 and B1 level.  You do describe some moderate high level stress and insomnia.  I am going to prescribe you trazodone.  You might start with a lower dose first before taking full tablet.  While using trazodone would recommend not using Benadryl.  No obvious allergy symptoms reported so I do not think your eye redness is related to allergic conjunctivitis.  It might be from extreme your lack of sleep.  If you do get consecutive good back to sleep and you still have eye redness might consider giving a trial of allergy eyedrops.  Mild sugar elevation in the past with some excess urination at night.  Will follow A1c level.  For urinary frequency decided to get urine culture.  Follow-up date to be  determined after lab review.   Mackie Pai, PA-C

## 2017-11-28 NOTE — Patient Instructions (Addendum)
For your recent severe fatigue, I am ordering CMP, CBC, TSH, T4, vitamin D, B12 and B1 level.  You do describe some moderate high level stress and insomnia.  I am going to prescribe you trazodone.  You might start with a lower dose first before taking full tablet.  While using trazodone would recommend not using Benadryl.  No obvious allergy symptoms reported so I do not think your eye redness is related to allergic conjunctivitis.  It might be from extreme your lack of sleep.  If you do get consecutive good back to sleep and you still have eye redness might consider giving a trial of allergy eyedrops.  Mild sugar elevation in the past with some excess urination at night.  Will follow A1c level.  For urinary frequency decided to get urine culture.  Follow-up date to be determined after lab review.

## 2017-11-29 LAB — COMPREHENSIVE METABOLIC PANEL
ALBUMIN: 4.6 g/dL (ref 3.5–5.2)
ALT: 31 U/L (ref 0–35)
AST: 17 U/L (ref 0–37)
Alkaline Phosphatase: 80 U/L (ref 39–117)
BUN: 18 mg/dL (ref 6–23)
CALCIUM: 9.5 mg/dL (ref 8.4–10.5)
CHLORIDE: 106 meq/L (ref 96–112)
CO2: 22 meq/L (ref 19–32)
CREATININE: 1.07 mg/dL (ref 0.40–1.20)
GFR: 68.95 mL/min (ref >60.00)
Glucose, Bld: 91 mg/dL (ref 70–99)
POTASSIUM: 3.7 meq/L (ref 3.5–5.1)
SODIUM: 139 meq/L (ref 135–145)
Total Bilirubin: 0.3 mg/dL (ref 0.2–1.2)
Total Protein: 7.3 g/dL (ref 6.0–8.3)

## 2017-11-29 LAB — CBC WITH DIFFERENTIAL/PLATELET
BASOS PCT: 1.3 % (ref 0.0–3.0)
Basophils Absolute: 0.1 10*3/uL (ref 0.0–0.1)
EOS ABS: 0.3 10*3/uL (ref 0.0–0.7)
EOS PCT: 3.2 % (ref 0.0–5.0)
HCT: 40.4 % (ref 36.0–46.0)
HEMOGLOBIN: 13.4 g/dL (ref 12.0–15.0)
LYMPHS ABS: 2.3 10*3/uL (ref 0.7–4.0)
Lymphocytes Relative: 27.9 % (ref 12.0–46.0)
MCHC: 33.1 g/dL (ref 30.0–36.0)
MCV: 84.2 fl (ref 78.0–100.0)
MONO ABS: 0.6 10*3/uL (ref 0.1–1.0)
Monocytes Relative: 7.3 % (ref 3.0–12.0)
NEUTROS ABS: 5 10*3/uL (ref 1.4–7.7)
Neutrophils Relative %: 60.3 % (ref 43.0–77.0)
PLATELETS: 401 10*3/uL — AB (ref 150.0–400.0)
RBC: 4.8 Mil/uL (ref 3.87–5.11)
RDW: 14.2 % (ref 11.5–15.5)
WBC: 8.3 10*3/uL (ref 4.0–10.5)

## 2017-11-29 LAB — HEMOGLOBIN A1C: HEMOGLOBIN A1C: 7.2 % — AB (ref 4.6–6.5)

## 2017-11-29 LAB — VITAMIN D 25 HYDROXY (VIT D DEFICIENCY, FRACTURES): VITD: 35.67 ng/mL (ref 30.00–100.00)

## 2017-11-29 LAB — VITAMIN B12: Vitamin B-12: 1096 pg/mL — ABNORMAL HIGH (ref 211–911)

## 2017-11-29 LAB — TSH: TSH: 1.89 u[IU]/mL (ref 0.35–4.50)

## 2017-11-29 MED FILL — traZODone HCL 50 MG TABS: 50 | 30 days supply | Qty: 30 | Fill #0

## 2017-11-30 LAB — URINE CULTURE
MICRO NUMBER:: 90715843
Result:: NO GROWTH
SPECIMEN QUALITY:: ADEQUATE

## 2017-12-04 LAB — VITAMIN B1: Vitamin B1 (Thiamine): 14 nmol/L (ref 8–30)

## 2017-12-04 LAB — T4: T4 TOTAL: 7.7 ug/dL (ref 5.1–11.9)

## 2017-12-05 ENCOUNTER — Ambulatory Visit (INDEPENDENT_AMBULATORY_CARE_PROVIDER_SITE_OTHER): Payer: No Typology Code available for payment source | Admitting: Family

## 2017-12-05 ENCOUNTER — Ambulatory Visit: Payer: No Typology Code available for payment source | Admitting: Family

## 2017-12-05 ENCOUNTER — Encounter: Payer: Self-pay | Admitting: Family

## 2017-12-05 VITALS — BP 110/70 | HR 59 | Temp 98.4°F | Resp 16 | Ht 62.0 in | Wt 195.0 lb

## 2017-12-05 DIAGNOSIS — R1013 Epigastric pain: Secondary | ICD-10-CM | POA: Diagnosis not present

## 2017-12-05 DIAGNOSIS — E119 Type 2 diabetes mellitus without complications: Secondary | ICD-10-CM | POA: Diagnosis not present

## 2017-12-05 DIAGNOSIS — Z23 Encounter for immunization: Secondary | ICD-10-CM | POA: Diagnosis not present

## 2017-12-05 DIAGNOSIS — I10 Essential (primary) hypertension: Secondary | ICD-10-CM

## 2017-12-05 DIAGNOSIS — R5383 Other fatigue: Secondary | ICD-10-CM

## 2017-12-05 LAB — MICROALBUMIN / CREATININE URINE RATIO
Creatinine,U: 173.6 mg/dL
Microalb Creat Ratio: 0.8 mg/g (ref 0.0–30.0)
Microalb, Ur: 1.4 mg/dL (ref 0.0–1.9)

## 2017-12-05 MED ORDER — AMLODIPINE BESYLATE 5 MG PO TABS: 5.0000 mg | ORAL_TABLET | Freq: Every day | ORAL | 5 refills | Status: DC

## 2017-12-05 MED ORDER — OMEPRAZOLE 40 MG PO CPDR: 40.0000 mg | DELAYED_RELEASE_CAPSULE | Freq: Every day | ORAL | 3 refills | Status: DC

## 2017-12-05 MED ORDER — LOSARTAN POTASSIUM-HCTZ 100-25 MG PO TABS: 1.0000 | ORAL_TABLET | Freq: Every day | ORAL | 5 refills | Status: DC

## 2017-12-05 MED ORDER — POTASSIUM CHLORIDE CRYS ER 20 MEQ PO TBCR: 20.0000 meq | EXTENDED_RELEASE_TABLET | Freq: Every day | ORAL | 5 refills | Status: DC

## 2017-12-05 NOTE — Progress Notes (Signed)
Subjective:    Patient ID: Monique Wiggins, female    DOB: 09-21-1965, 52 y.o.   MRN: [id number]  HPI  Monique Wiggins is a 52 yr old female who presents today for follow up.  HTN- maintained on coreg, hyzar, amlodipine.  BP Readings from Last 3 Encounters:  12/05/17 110/70  11/28/17 132/75  07/23/17 133/87   DM2- new diagnosis.  Lab Results  Component Value Date   HGBA1C 7.2 (H) 11/28/2017   Lab Results  Component Value Date   CREATININE 1.07 11/28/2017    Insomnia- maintained on trazodone HS. Was evaluated for severe fatigue by Monique Wiggins last week.  Cmet, cbc, vit D, b1, b12, tsh all ok.  Has not started trazodone.   Reports some epigastric discomfort similar to previous time when she had H Pylori. Requests H pylori testing.   Review of Systems See HPI    Past Medical History:  Diagnosis Date  . Allergy   . DVT (deep venous thrombosis) ([place])    after long car trip  . H. pylori infection    1997  . Heart murmur   . Hypertension   . Hypertriglyceridemia 02/22/2017  . Hypertriglyceridemia 02/22/2017  . Migraines   . Sickle cell trait ([place])      Social History   Socioeconomic History  . Marital status: Married    Spouse name: Not on file  . Number of children: Not on file  . Years of education: Not on file  . Highest education level: Not on file  Occupational History  . Not on file  Social Needs  . Financial resource strain: Not on file  . Food insecurity:    Worry: Not on file    Inability: Not on file  . Transportation needs:    Medical: Not on file    Non-medical: Not on file  Tobacco Use  . Smoking status: Never Smoker  . Smokeless tobacco: Never Used  Substance and Sexual Activity  . Alcohol use: No    Comment: never  . Drug use: No  . Sexual activity: Not on file  Lifestyle  . Physical activity:    Days per week: Not on file    Minutes per session: Not on file  . Stress: Not on file  Relationships  . Social connections:    Talks  on phone: Not on file    Gets together: Not on file    Attends religious service: Not on file    Active member of club or organization: Not on file    Attends meetings of clubs or organizations: Not on file    Relationship status: Not on file  . Intimate partner violence:    Fear of current or ex partner: Not on file    Emotionally abused: Not on file    Physically abused: Not on file    Forced sexual activity: Not on file  Other Topics Concern  . 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 ABDOMINAL HYSTERECTOMY    . TOE SURGERY    . TONSILLECTOMY    .  tummy tuck      Family History  Problem Relation Age of Onset  . Diabetes type II Mother   . Diabetes Mellitus II Father   . Pulmonary fibrosis Father   . Hypertension Sister   . Diabetes Sister   . Colon cancer Neg Hx   . Esophageal cancer Neg Hx   . Pancreatic cancer Neg Hx   . Stomach cancer Neg Hx     Allergies  Allergen Reactions  . Pollen Extract Swelling  . Shellfish Allergy Swelling    Current Outpatient Medications on File Prior to Visit  Medication Sig Dispense Refill  . amLODipine (NORVASC) 5 MG tablet Take 1 tablet (5 mg total) by mouth daily. 30 tablet 5  . carvedilol (COREG) 25 MG tablet TAKE 1 TABLET BY MOUTH TWICE DAILY WITH MEALS 28 tablet 0  . cetirizine (ZYRTEC) 10 MG tablet Take 1 tablet (10 mg total) by mouth daily. 30 tablet 11  . ibuprofen (ADVIL,MOTRIN) 200 MG tablet Take 800 mg by mouth every 6 (six) hours as needed for mild pain.    Marland Kitchen losartan-hydrochlorothiazide (HYZAAR) 100-25 MG tablet Take 1 tablet by mouth daily. 30  tablet 5  . meloxicam (MOBIC) 7.5 MG tablet Take 1 tablet (7.5 mg total) by mouth daily. 14 tablet 0  . methocarbamol (ROBAXIN) 500 MG tablet Take 1 tablet (500 mg total) by mouth at bedtime as needed for muscle spasms. 20 tablet 0  . mometasone (ELOCON) 0.1 % cream Apply 1 application topically daily. 45 g 2  . naproxen (NAPROSYN) 500 MG tablet Take 1 tablet (500 mg total) by mouth 2 (two) times daily with a meal. 30 tablet 0  . potassium chloride SA (K-DUR,KLOR-CON) 20 MEQ tablet Take 1 tablet (20 mEq total) by mouth daily. 30 tablet 5  . topiramate (TOPAMAX) 50 MG tablet Take 1 tablet (50 mg total) by mouth daily. 30 tablet 5  . traZODone (DESYREL) 50 MG tablet Take 0.5-1 tablets (25-50 mg total) by mouth at bedtime as needed for sleep. 30 tablet 1  . tretinoin (RETIN-A) 0.025 % cream Apply topically at bedtime. 45 g 2   No current facility-administered medications on file prior to visit.     BP 110/70 (BP Location: Right Arm, Patient Position: Sitting, Cuff Size: Large)   Pulse (!) 59   Temp 98.4 F (36.9 C) (Oral)   Resp 16   Ht 5\' 2"  (1.575 m)   Wt 195 lb (88.5 kg)   SpO2 99%   BMI 35.67 kg/m    Objective:   Physical Exam  Constitutional: She appears well-developed and well-nourished.  Cardiovascular: Normal rate, regular rhythm and normal heart sounds.  No murmur heard. Pulmonary/Chest: Effort normal and breath sounds normal. No respiratory distress. She has no wheezes.  Psychiatric: She has a normal mood and affect. Her behavior is normal. Judgment and thought content normal.          Assessment & Plan:  DM2- new diagnosis. Check urine microalbumin. Discussed diabetic diet, exercise and weight loss. Pneumovax today.  HTN- stable. Continue current meds.  Fatigue- I think it is related to sleeping poorly and stress. Will have her begin trazodone and given contact info to establish with a counselor.  Epigastric discomfort- add omeprazole, check H pylori test.

## 2017-12-05 NOTE — Patient Instructions (Signed)
Please complete lab work prior to leaving. Begin omeprazole once daily.  Begin trazodone. Please schedule an appointment with a counselor. Work on diabetic diet, exercise and weight loss.

## 2017-12-06 LAB — H. PYLORI BREATH TEST: H. PYLORI BREATH TEST: NOT DETECTED

## 2017-12-24 MED FILL — TOPIRAMATE 50 MG TABLET: 50 | 30 days supply | Qty: 30 | Fill #3

## 2017-12-24 MED FILL — LOSARTAN-HCTZ 100-25 MG TAB: 100-25 | 30 days supply | Qty: 30 | Fill #0

## 2017-12-24 MED FILL — POTASSIUM CL ER 20 MEQ TABL: 20 | 30 days supply | Qty: 30 | Fill #0

## 2017-12-25 ENCOUNTER — Encounter: Payer: Self-pay | Admitting: Family

## 2017-12-25 DIAGNOSIS — H547 Unspecified visual loss: Secondary | ICD-10-CM

## 2017-12-30 ENCOUNTER — Encounter: Payer: Self-pay | Admitting: Family

## 2017-12-30 DIAGNOSIS — H547 Unspecified visual loss: Secondary | ICD-10-CM

## 2017-12-30 MED ORDER — FREESTYLE LIBRE SENSOR SYSTEM MISC: 0 refills | Status: DC

## 2017-12-31 MED FILL — TRETINOIN 0.025% CREAM: 0.025 | 30 days supply | Qty: 45 | Fill #1

## 2018-01-02 ENCOUNTER — Telehealth: Payer: Self-pay | Admitting: *Deleted

## 2018-01-02 MED ORDER — FREESTYLE LIBRE READER DEVI: 1.0000 | Freq: Once | 0 refills | Status: DC

## 2018-01-02 NOTE — Telephone Encounter (Signed)
Received fax from [place] that PA is required for [employer]. Spoke with Jess at [employer] and was told rx processed was for 10 day sensors. Initiated PA for [employer], case # [id number] and [employer], case # [id number]. Determination should be reached in 24 to 72 hours and determination will automatically be sent to the pharmacy as well as PCP. Awaiting determination.

## 2018-01-06 NOTE — Telephone Encounter (Signed)
Received additional questions regarding below PA request. Answers provided and faxed to [place] at [phone]. Awaiting response.

## 2018-01-08 NOTE — Telephone Encounter (Signed)
Copied from [place] [id number]. Topic: General - Other >> Jan 08, 2018  1:50 PM Mcneil, Ja-Kwan wrote: Reason for CRM: Pt states she was informed by her insurance company that a verbal prior authorization is the only thing needed. Pt request that PCP contacts insurance company to provided verbal prior authorization. Cb# [phone]

## 2018-01-08 NOTE — Telephone Encounter (Signed)
Spoke w/Amy at [employer] [phone]), form was received and a decision should be reached by this afternoon. We will receive a fax notification and the pharmacy will be notified by them as well. Awaiting decision.

## 2018-01-08 NOTE — Telephone Encounter (Signed)
Notified pt that we have contacted the insurance and we have answered questions via phone and additional questions were faxed to be answered which was sent back this week and we are waiting on the insurance response. Pt voices understanding.

## 2018-01-10 ENCOUNTER — Telehealth: Payer: Self-pay

## 2018-01-10 ENCOUNTER — Encounter: Payer: Self-pay | Admitting: Family

## 2018-01-10 NOTE — Telephone Encounter (Signed)
Author phoned pt. Re: PA determination and refusal to cover freestyle libre glucometer and supplies. No answer; author left detailed VM to return call to [phone].

## 2018-01-13 MED ORDER — FREESTYLE LIBRE READER DEVI: 1.0000 | Freq: Once | 0 refills | Status: AC

## 2018-01-13 MED ORDER — FREESTYLE LIBRE SENSOR SYSTEM MISC: 3 refills | Status: DC

## 2018-01-13 NOTE — Telephone Encounter (Signed)
Could you please call pharmacy and ask them to dispense as below?

## 2018-01-13 NOTE — Telephone Encounter (Signed)
Attempted to reach pt and left detailed message on voicemail that a prescription has been sent to the pharmacy she requested below.

## 2018-01-13 NOTE — Telephone Encounter (Signed)
Monique Wiggins A 01/08/2018 04:34 PM  Pt called and would like the Continuous Blood Gluc Sensor ([place]) [place] [[id number]] / 3 box full system (1 full month)  Called into [facility] on [address]  She has a coupon she can use for this at [employer].  This will not need a PA she is paying out of pocket for this and using the coupon

## 2018-01-13 NOTE — Telephone Encounter (Signed)
We received denial from insurance due to:  1) not currently performing at least 4 finger-stick glucose daily and 2) pt not currently on insulin treatment requiring frequent adjustment of insulin dosing.

## 2018-01-13 NOTE — Telephone Encounter (Signed)
Pt. stated she is not going through her insurance for her needed supplies, but has a coupon she is going to use. She is needing an rx for 1 free libre reader and two 14-day sensors, sent to [facility] [address]. Chief Strategy Officer routed to [employer] to order and approve.

## 2018-01-13 NOTE — Telephone Encounter (Signed)
Rx already sent, see 01/02/18 phone note and detailed message was left for pt at 10:25am.

## 2018-01-14 MED ORDER — FREESTYLE LIBRE 14 DAY SENSOR MISC: 1.0000 | 0 refills | Status: DC

## 2018-01-14 NOTE — Addendum Note (Signed)
Addended by: Kelle Darting A on: 01/14/2018 09:47 AM   Modules accepted: Orders

## 2018-01-20 ENCOUNTER — Ambulatory Visit (INDEPENDENT_AMBULATORY_CARE_PROVIDER_SITE_OTHER): Payer: No Typology Code available for payment source | Admitting: Family

## 2018-01-20 ENCOUNTER — Encounter: Payer: Self-pay | Admitting: Family

## 2018-01-20 VITALS — BP 126/73 | HR 61 | Temp 99.4°F | Resp 16 | Ht 62.0 in | Wt 191.0 lb

## 2018-01-20 DIAGNOSIS — E119 Type 2 diabetes mellitus without complications: Secondary | ICD-10-CM | POA: Diagnosis not present

## 2018-01-20 DIAGNOSIS — M545 Low back pain: Secondary | ICD-10-CM

## 2018-01-20 DIAGNOSIS — G8929 Other chronic pain: Secondary | ICD-10-CM

## 2018-01-20 DIAGNOSIS — I10 Essential (primary) hypertension: Secondary | ICD-10-CM | POA: Diagnosis not present

## 2018-01-20 NOTE — Progress Notes (Signed)
Subjective:    Patient ID: Monique Wiggins, female    DOB: September 05, 1965, 52 y.o.   MRN: [id number]  HPI  Patient is a 52 yr old female who presents today for follow up.  HTN- amlodipine, coreg and hyzaar.   BP Readings from Last 3 Encounters:  01/20/18 126/73  12/05/17 110/70  11/28/17 132/75   DM2- reports sugars at home (high 137- post prandial).  Reports that she had one episode of hypoglycemia (int he 70's) Lab Results  Component Value Date   HGBA1C 7.2 (H) 11/28/2017   Lab Results  Component Value Date   MICROALBUR 1.4 12/05/2017   CREATININE 1.07 11/28/2017   Back pain- she was scheduled for spinal injections/ nerve block and insurance     Review of Systems See HPI  Past Medical History:  Diagnosis Date  . Allergy   . DVT (deep venous thrombosis) ([place])    after long car trip  . H. pylori infection    1997  . Heart murmur   . Hypertension   . Hypertriglyceridemia 02/22/2017  . Hypertriglyceridemia 02/22/2017  . Migraines   . Sickle cell trait ([place])      Social History   Socioeconomic History  . Marital status: Married    Spouse name: Not on file  . Number of children: Not on file  . Years of education: Not on file  . Highest education level: Not on file  Occupational History  . Not on file  Social Needs  . Financial resource strain: Not on file  . Food insecurity:    Worry: Not on file    Inability: Not on file  . Transportation needs:    Medical: Not on file    Non-medical: Not on file  Tobacco Use  . Smoking status: Never Smoker  . Smokeless tobacco: Never Used  Substance and Sexual Activity  . Alcohol use: No    Comment: never  . Drug use: No  . Sexual activity: Not on file  Lifestyle  . Physical activity:    Days per week: Not on file    Minutes per session: Not on file  . Stress: Not on file  Relationships  . Social connections:    Talks on phone: Not on file    Gets together: Not on file    Attends religious service: Not  on file    Active member of club or organization: Not on file    Attends meetings of clubs or organizations: Not on file    Relationship status: Not on file  . Intimate partner violence:    Fear of current or ex partner: Not on file    Emotionally abused: Not on file    Physically abused: Not on file    Forced sexual activity: Not on file  Other Topics Concern  . 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 ABDOMINAL HYSTERECTOMY    . TOE SURGERY    . TONSILLECTOMY    . tummy tuck      Family History  Problem Relation Age of Onset  .  Diabetes type II Mother   . Diabetes Mellitus II Father   . Pulmonary fibrosis Father   . Hypertension Sister   . Diabetes Sister   . Colon cancer Neg Hx   . Esophageal cancer Neg Hx   . Pancreatic cancer Neg Hx   . Stomach cancer Neg Hx     Allergies  Allergen Reactions  . Pollen Extract Swelling  . Shellfish Allergy Swelling    Current Outpatient Medications on File Prior to Visit  Medication Sig Dispense Refill  . amLODipine (NORVASC) 5 MG tablet Take 1 tablet (5 mg total) by mouth daily. 30 tablet 5  . carvedilol (COREG) 25 MG tablet TAKE 1 TABLET BY MOUTH TWICE DAILY WITH MEALS 28 tablet 0  . cetirizine (ZYRTEC) 10 MG tablet Take 1 tablet (10 mg total) by mouth daily. 30 tablet 11  . Continuous Blood Gluc Sensor (FREESTYLE LIBRE 14 DAY SENSOR) MISC 1 each by Does not apply route every 14 (fourteen) days. 2 each 0  . ibuprofen (ADVIL,MOTRIN) 200 MG tablet Take 800 mg by mouth every 6 (six) hours as needed for mild pain.    Marland Kitchen losartan-hydrochlorothiazide (HYZAAR) 100-25  MG tablet Take 1 tablet by mouth daily. 30 tablet 5  . meloxicam (MOBIC) 7.5 MG tablet Take 1 tablet (7.5 mg total) by mouth daily. 14 tablet 0  . methocarbamol (ROBAXIN) 500 MG tablet Take 1 tablet (500 mg total) by mouth at bedtime as needed for muscle spasms. 20 tablet 0  . mometasone (ELOCON) 0.1 % cream Apply 1 application topically daily. 45 g 2  . naproxen (NAPROSYN) 500 MG tablet Take 1 tablet (500 mg total) by mouth 2 (two) times daily with a meal. 30 tablet 0  . omeprazole (PRILOSEC) 40 MG capsule Take 1 capsule (40 mg total) by mouth daily. 30 capsule 3  . potassium chloride SA (K-DUR,KLOR-CON) 20 MEQ tablet Take 1 tablet (20 mEq total) by mouth daily. 30 tablet 5  . topiramate (TOPAMAX) 50 MG tablet Take 1 tablet (50 mg total) by mouth daily. 30 tablet 5  . traZODone (DESYREL) 50 MG tablet Take 0.5-1 tablets (25-50 mg total) by mouth at bedtime as needed for sleep. 30 tablet 1  . tretinoin (RETIN-A) 0.025 % cream Apply topically at bedtime. 45 g 2   No current facility-administered medications on file prior to visit.     BP 126/73 (BP Location: Right Arm, Patient Position: Sitting, Cuff Size: Large)   Pulse 61   Temp 99.4 F (37.4 C) (Oral)   Resp 16   Ht 5\' 2"  (1.575 m)   Wt 191 lb (86.6 kg)   SpO2 97%   BMI 34.93 kg/m       Objective:   Physical Exam  Constitutional: She is oriented to person, place, and time. She appears well-developed and well-nourished.  Cardiovascular: Normal rate, regular rhythm and normal heart sounds.  No murmur heard. Pulmonary/Chest: Effort normal and breath sounds normal. No respiratory distress. She has no wheezes.  Neurological: She is alert and oriented to person, place, and time.  Skin: Skin is warm and dry.  Psychiatric: She has a normal mood and affect. Her behavior is normal. Judgment and thought content normal.          Assessment & Plan:  Diabetes type 2- discussed healthy diabetic diet, exercise, and weight loss.  A1c was  slightly above goal last visit.  Hypertension- stable on current meds.  Continue same.  Back pain-this is chronic.  She wishes  to avoid surgical intervention.  She may be open minded to spinal injections but needs to see who is in network and will let me know if she desires a referral.

## 2018-01-20 NOTE — Patient Instructions (Signed)
Let me know if you would like a referral to a different specialist for your back.

## 2018-01-22 MED FILL — POTASSIUM CL ER 20 MEQ TABL: 20 | 30 days supply | Qty: 30 | Fill #1

## 2018-01-22 MED FILL — LOSARTAN-HCTZ 100-25 MG TAB: 100-25 | 30 days supply | Qty: 30 | Fill #1

## 2018-02-01 ENCOUNTER — Encounter: Payer: Self-pay | Admitting: Family Medicine

## 2018-02-01 ENCOUNTER — Ambulatory Visit (INDEPENDENT_AMBULATORY_CARE_PROVIDER_SITE_OTHER): Payer: No Typology Code available for payment source | Admitting: Family Medicine

## 2018-02-01 DIAGNOSIS — R09A2 Foreign body sensation, throat: Secondary | ICD-10-CM | POA: Insufficient documentation

## 2018-02-01 DIAGNOSIS — G8929 Other chronic pain: Secondary | ICD-10-CM | POA: Diagnosis not present

## 2018-02-01 DIAGNOSIS — M5441 Lumbago with sciatica, right side: Secondary | ICD-10-CM | POA: Diagnosis not present

## 2018-02-01 DIAGNOSIS — R0989 Other specified symptoms and signs involving the circulatory and respiratory systems: Secondary | ICD-10-CM | POA: Diagnosis not present

## 2018-02-01 MED ORDER — PREDNISONE 20 MG PO TABS: ORAL_TABLET | ORAL | 0 refills | Status: DC

## 2018-02-01 MED ORDER — OMEPRAZOLE 40 MG PO CPDR: 40.0000 mg | DELAYED_RELEASE_CAPSULE | Freq: Every day | ORAL | 1 refills | Status: DC

## 2018-02-01 NOTE — Patient Instructions (Signed)
Stop ibuprofen and other NSAIDs.  Use tylenol instead for pain.  Can try prednisone taper for sciatica... But if not improving follow up PCP.  Restart omeprazole 40 mg daily for 2-4 weeks.. If not better call for follow up.  Decrease oranges and citris juice and coffee as well as other triggers.

## 2018-02-01 NOTE — Progress Notes (Signed)
Subjective:    Patient ID: Monique Wiggins, female    DOB: 10-Jan-1966, 52 y.o.   MRN: [id number]  Sore Throat   This is a new problem. The current episode started 1 to 4 weeks ago (feels liek something in her throat, not really sore.). The problem has been gradually worsening. Neither side of throat is experiencing more pain than the other. There has been no fever. Pertinent negatives include no abdominal pain, coughing, diarrhea, ear discharge, ear pain, headaches, plugged ear sensation, neck pain, shortness of breath, swollen glands or trouble swallowing. Associated symptoms comments: No dysphagia.   New med trazodone in last few weeks... Has helped her sleep.   No  Losartan change... No tounge , lip swelling.    Has not been taking omeprazole for heartburn... Last few nights has had some heart burn.   She is using ibuprofen for back pain.Marland Kitchen 800 mg daily... Has been doing that for several months.  She has chronic right low back pain radiating to right leg. No numbness and  mild weakness in Am when waking up.   Blood pressure 122/74, pulse 65, temperature 98.6 F (37 C), temperature source Oral, resp. rate 16, height 5\' 2"  (1.575 m), weight 193 lb (87.5 kg), SpO2 98 %. Social History /Family History/Past Medical History reviewed in detail and updated in EMR if needed.   Review of Systems  HENT: Negative for ear discharge, ear pain and trouble swallowing.   Respiratory: Negative for cough and shortness of breath.   Gastrointestinal: Negative for abdominal pain and diarrhea.  Musculoskeletal: Negative for neck pain.  Neurological: Negative for headaches.       Objective:   Physical Exam  Constitutional: Vital signs are normal. She appears well-developed and well-nourished. She is cooperative.  Non-toxic appearance. She does not appear ill. No distress.  HENT:  Head: Normocephalic.  Right Ear: Hearing, tympanic membrane, external ear and ear canal normal. Tympanic membrane  is not erythematous, not retracted and not bulging.  Left Ear: Hearing, tympanic membrane, external ear and ear canal normal. Tympanic membrane is not erythematous, not retracted and not bulging.  Nose: No mucosal edema or rhinorrhea. Right sinus exhibits no maxillary sinus tenderness and no frontal sinus tenderness. Left sinus exhibits no maxillary sinus tenderness and no frontal sinus tenderness.  Mouth/Throat: Uvula is midline, oropharynx is clear and moist and mucous membranes are normal.  Eyes: Pupils are equal, round, and reactive to light. Conjunctivae, EOM and lids are normal. Lids are everted and swept, no foreign bodies found.  Neck: Trachea normal and normal range of motion. Neck supple. Carotid bruit is not present. No thyroid mass and no thyromegaly present.  Cardiovascular: Normal rate, regular rhythm, S1 normal, S2 normal, normal heart sounds, intact distal pulses and normal pulses. Exam reveals no gallop and no friction rub.  No murmur heard. Pulmonary/Chest: Effort normal and breath sounds normal. No tachypnea. No respiratory distress. She has no decreased breath sounds. She has no wheezes. She has no rhonchi. She has no rales.  Abdominal: Soft. Normal appearance and bowel sounds are normal. There is no tenderness.  Musculoskeletal:       Lumbar back: She exhibits decreased range of motion and tenderness. She exhibits no bony tenderness.   Positive right SLR  Neurological: She is alert.  Skin: Skin is warm, dry and intact. No rash noted.  Psychiatric: Her speech is normal and behavior is normal. Judgment and thought content normal. Her mood appears not anxious. Cognition and memory  are normal. She does not exhibit a depressed mood.          Assessment & Plan:

## 2018-02-01 NOTE — Assessment & Plan Note (Signed)
No infectious or allergy symptoms... Most likely occult reflux. Stop NSAIDs, decrease acidic triggers and restart omeprazole 40 mg dialy.

## 2018-02-01 NOTE — Assessment & Plan Note (Signed)
Flare of pain.Marland Kitchen Has been using a lot of ibuprofen for a while... Will treat with pred taper to try to stop NSAIDS, use tylenol for pain... Needs to follow up with PCP regarding long term plan.

## 2018-02-03 MED FILL — OMEPRAZOLE 40 MG CPDR: 40 | 30 days supply | Qty: 30 | Fill #0

## 2018-02-03 MED FILL — predniSONE 20 MG TABS: 20 | 7 days supply | Qty: 15 | Fill #0

## 2018-02-04 MED FILL — TOPIRAMATE 50 MG TABLET: 50 | 30 days supply | Qty: 30 | Fill #4

## 2018-02-10 ENCOUNTER — Telehealth: Payer: Self-pay

## 2018-02-10 NOTE — Telephone Encounter (Signed)
Copied from [facility] [id number]. Topic: General - Other >> Feb 10, 2018 11:06 AM Carolyn Stare wrote:  Pt call to say say her husband is a pt of Dr Charlett Blake and said Dr Charlett Blake will except her as a TOC  pt will you verify and contact pt   [phone]    Please advise

## 2018-02-10 NOTE — Telephone Encounter (Signed)
OK with me.

## 2018-02-11 NOTE — Telephone Encounter (Signed)
Transfer care to Dr. Charlett Blake, and reschedule 9/16 appointment made with [facility] if possible please.

## 2018-02-11 NOTE — Telephone Encounter (Signed)
Please advise 

## 2018-02-11 NOTE — Telephone Encounter (Signed)
Ok with me 

## 2018-02-11 NOTE — Telephone Encounter (Signed)
Called pt and LVM regarding her toc request. Informed pt that the providers agreed to the transfer and advised pt to call back to set up a toc appt.

## 2018-02-28 MED FILL — LOSARTAN-HCTZ 100-25 MG TAB: 100-25 | 30 days supply | Qty: 30 | Fill #2

## 2018-02-28 MED FILL — AMLODIPINE BESYLATE 5 MG TA: 5 | 30 days supply | Qty: 30 | Fill #0

## 2018-02-28 MED FILL — POTASSIUM CL ER 20 MEQ TABL: 20 | 30 days supply | Qty: 30 | Fill #2

## 2018-02-28 MED FILL — TOPIRAMATE 50 MG TABLET: 50 | 30 days supply | Qty: 30 | Fill #5

## 2018-03-03 ENCOUNTER — Ambulatory Visit (INDEPENDENT_AMBULATORY_CARE_PROVIDER_SITE_OTHER): Payer: No Typology Code available for payment source | Admitting: Family Medicine

## 2018-03-03 ENCOUNTER — Ambulatory Visit: Payer: No Typology Code available for payment source | Admitting: Family

## 2018-03-03 ENCOUNTER — Encounter: Payer: Self-pay | Admitting: Family Medicine

## 2018-03-03 VITALS — BP 112/70 | HR 62 | Temp 97.7°F | Resp 18 | Wt 191.8 lb

## 2018-03-03 DIAGNOSIS — G8929 Other chronic pain: Secondary | ICD-10-CM

## 2018-03-03 DIAGNOSIS — E781 Pure hyperglyceridemia: Secondary | ICD-10-CM

## 2018-03-03 DIAGNOSIS — Z0000 Encounter for general adult medical examination without abnormal findings: Secondary | ICD-10-CM | POA: Diagnosis not present

## 2018-03-03 DIAGNOSIS — E785 Hyperlipidemia, unspecified: Secondary | ICD-10-CM

## 2018-03-03 DIAGNOSIS — M5441 Lumbago with sciatica, right side: Secondary | ICD-10-CM

## 2018-03-03 DIAGNOSIS — Z1231 Encounter for screening mammogram for malignant neoplasm of breast: Secondary | ICD-10-CM | POA: Diagnosis not present

## 2018-03-03 DIAGNOSIS — E876 Hypokalemia: Secondary | ICD-10-CM | POA: Diagnosis not present

## 2018-03-03 DIAGNOSIS — K219 Gastro-esophageal reflux disease without esophagitis: Secondary | ICD-10-CM

## 2018-03-03 DIAGNOSIS — Z1239 Encounter for other screening for malignant neoplasm of breast: Secondary | ICD-10-CM

## 2018-03-03 DIAGNOSIS — Z23 Encounter for immunization: Secondary | ICD-10-CM

## 2018-03-03 DIAGNOSIS — E119 Type 2 diabetes mellitus without complications: Secondary | ICD-10-CM | POA: Diagnosis not present

## 2018-03-03 NOTE — Progress Notes (Signed)
Subjective:  I acted as a Education administrator for Dr. Charlett Blake. [place], [place]  Patient ID: Monique Wiggins, female    DOB: 08-15-1965, 52 y.o.   MRN: [id number]  No chief complaint on file.   HPI  Patient is in today for annual preventative exam and follow up on chronic medical concerns including hyperglycemia, reflux and hypertension. She feels well today. No recent febrile illness or hospitalizations. No polyuria or polydipsia. Is worried about her sugar trending up. Is doing well with activities of daily living. Tries to stay busy and maintain a heat healthy diet. Denies CP/palp/SOB/HA/congestion/fevers/GI or GU c/o. Taking meds as prescribed  Patient Care Team: Mosie Lukes, MD as PCP - General (Family Medicine) Nanci Pina, FNP as Nurse Practitioner (Nurse Practitioner) System, Pcp Not In   Past Medical History:  Diagnosis Date  . Allergy   . Anemia   . Diabetes mellitus without complication ([place])   . DVT (deep venous thrombosis) ([place])    after long car trip  . H. pylori infection    1997  . Heart murmur   . Hypertension   . Hypertriglyceridemia 02/22/2017  . Hypertriglyceridemia 02/22/2017  . Migraines   . Sickle cell trait [facility])     Past Surgical History:  Procedure Laterality Date  . ABDOMINAL HYSTERECTOMY     ovaries left in place  . TOE SURGERY     left great toe, repair of severed tendon  . TONSILLECTOMY    . tummy tuck      Family History  Problem Relation Age of Onset  . Diabetes type II Mother   . Hypertension Mother   . Diabetes Mother   . Diabetes Mellitus II Father   . Pulmonary fibrosis Father   . Diabetes Father   . Hypertension Father   . Pulmonary disease Father   . Hypertension Sister   . Diabetes Sister   . Cancer Maternal Grandmother        colon  . Allergic Disorder Son   . Cancer Maternal Grandfather        lung, smoker  . Allergic Disorder Son   . Asthma Son   . Colon cancer Neg Hx   . Esophageal cancer Neg Hx   . Pancreatic  cancer Neg Hx   . Stomach cancer Neg Hx     Social History   Socioeconomic History  . Marital status: Married    Spouse name: Not on file  . Number of children: Not on file  . Years of education: Not on file  . Highest education level: Not on file  Occupational History  . Not on file  Social Needs  . Financial resource strain: Not on file  . Food insecurity:    Worry: Not on file    Inability: Not on file  . Transportation needs:    Medical: Not on file    Non-medical: Not on file  Tobacco Use  . Smoking status: Never Smoker  . Smokeless tobacco: Never Used  Substance and Sexual Activity  . Alcohol use: No    Comment: never  . Drug use: No  . Sexual activity: Yes    Birth control/protection: Surgical  Lifestyle  . Physical activity:    Days per week: Not on file    Minutes per session: Not on file  . Stress: Not on file  Relationships  . Social connections:    Talks on phone: Not on file    Gets together: Not on file  Attends religious service: Not on file    Active member of club or organization: Not on file    Attends meetings of clubs or organizations: Not on file    Relationship status: Not on file  . Intimate partner violence:    Fear of current or ex partner: Not on file    Emotionally abused: Not on file    Physically abused: Not on file    Forced sexual activity: Not on file  Other Topics Concern  . 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 amLODipine (NORVASC) 5 MG tablet Take 1 tablet (5 mg total) by mouth daily. 30 tablet 5    . carvedilol (COREG) 25 MG tablet TAKE 1 TABLET BY MOUTH TWICE DAILY WITH MEALS 28 tablet 0  . cetirizine (ZYRTEC) 10 MG tablet Take 1 tablet (10 mg total) by mouth daily. 30 tablet 11  . Continuous Blood Gluc Sensor (FREESTYLE LIBRE 14 DAY SENSOR) MISC 1 each by Does not apply route every 14 (fourteen) days. 2 each 0  . ibuprofen (ADVIL,MOTRIN) 200 MG tablet Take 800 mg by mouth every 6 (six) hours as needed for mild pain.    Marland Kitchen losartan-hydrochlorothiazide (HYZAAR) 100-25 MG tablet Take 1 tablet by mouth daily. 30 tablet 5  . meloxicam (MOBIC) 7.5 MG tablet Take 1 tablet (7.5 mg total) by mouth daily. 14 tablet 0  . mometasone (ELOCON) 0.1 % cream Apply 1 application topically daily. 45 g 2  . omeprazole (PRILOSEC) 40 MG capsule Take 1 capsule (40 mg total) by mouth daily. 30 capsule 1  . potassium chloride SA (K-DUR,KLOR-CON) 20 MEQ tablet Take 1 tablet (20 mEq total) by mouth daily. 30 tablet 5  . predniSONE (DELTASONE) 20 MG tablet 3 tabs by mouth daily x 3 days, then 2 tabs by mouth daily x 2 days then 1 tab by mouth daily x 2 days 15 tablet 0  . topiramate (TOPAMAX) 50 MG tablet Take 1 tablet (50 mg total) by mouth daily. 30 tablet 5  . traZODone (DESYREL) 50 MG tablet Take 0.5-1 tablets (25-50 mg total) by mouth at bedtime as needed for sleep. 30 tablet 1  . tretinoin (RETIN-A) 0.025 % cream Apply topically at bedtime. 45 g 2  . methocarbamol (ROBAXIN) 500 MG tablet Take 1 tablet (500 mg total) by mouth at bedtime as needed for muscle spasms. 20 tablet 0  . naproxen (NAPROSYN) 500 MG tablet Take 1 tablet (500 mg total) by mouth 2 (two) times daily with a meal. 30 tablet 0   No facility-administered medications prior to visit.     Allergies  Allergen Reactions  . Pollen Extract Swelling  . Shellfish Allergy Swelling    Review of Systems  Constitutional: Negative for chills, fever and malaise/fatigue.  HENT: Negative for congestion and hearing loss.   Eyes: Negative for discharge.   Respiratory: Negative for cough, sputum production and shortness of breath.   Cardiovascular: Negative for chest pain, palpitations and  leg swelling.  Gastrointestinal: Negative for abdominal pain, blood in stool, constipation, diarrhea, heartburn, nausea and vomiting.  Genitourinary: Negative for dysuria, frequency, hematuria and urgency.  Musculoskeletal: Negative for back pain, falls and myalgias.  Skin: Negative for rash.  Neurological: Negative for dizziness, sensory change, loss of consciousness, weakness and headaches.  Endo/Heme/Allergies: Negative for environmental allergies. Does not bruise/bleed easily.  Psychiatric/Behavioral: Negative for depression and suicidal ideas. The patient is not nervous/anxious and does not have insomnia.        Objective:    Physical Exam  Constitutional: She is oriented to person, place, and time. She appears well-developed and well-nourished. No distress.  HENT:  Head: Normocephalic and atraumatic.  Eyes: Conjunctivae are normal.  Neck: Neck supple. No thyromegaly present.  Cardiovascular: Normal rate, regular rhythm and normal heart sounds.  No murmur heard. Pulmonary/Chest: Effort normal and breath sounds normal. No respiratory distress.  Abdominal: Soft. Bowel sounds are normal. She exhibits no distension and no mass. There is no tenderness.  Musculoskeletal: She exhibits no edema.  Lymphadenopathy:    She has no cervical adenopathy.  Neurological: She is alert and oriented to person, place, and time.  Skin: Skin is warm and dry.  Psychiatric: She has a normal mood and affect. Her behavior is normal.    BP 112/70 (BP Location: Left Arm, Patient Position: Sitting, Cuff Size: Normal)   Pulse 62   Temp 97.7 F (36.5 C) (Oral)   Resp 18   Wt 191 lb 12.8 oz (87 kg)   SpO2 97%   BMI 35.08 kg/m  Wt Readings from Last 3 Encounters:  03/03/18 191 lb 12.8 oz (87 kg)  02/01/18 193 lb (87.5 kg)  01/20/18 191 lb (86.6 kg)   BP Readings  from Last 3 Encounters:  03/03/18 112/70  02/01/18 122/74  01/20/18 126/73     Immunization History  Administered Date(s) Administered  . Influenza,inj,Quad PF,6+ Mos 03/03/2018  . Influenza-Unspecified 09/29/2016, 04/11/2017  . Pneumococcal Polysaccharide-23 12/05/2017  . Td 02/16/2014    Health Maintenance  Topic Date Due  . OPHTHALMOLOGY EXAM  10/11/1974  . HIV Screening  10/11/1979  . PAP SMEAR  06/18/2016  . HEMOGLOBIN A1C  09/01/2018  . FOOT EXAM  12/06/2018  . MAMMOGRAM  02/22/2019  . TETANUS/TDAP  02/17/2024  . COLONOSCOPY  05/04/2027  . INFLUENZA VACCINE  Completed  . PNEUMOCOCCAL POLYSACCHARIDE VACCINE AGE 37-64 HIGH RISK  Completed    Lab Results  Component Value Date   WBC 7.6 03/03/2018   HGB 13.9 03/03/2018   HCT 42.5 03/03/2018   PLT 359.0 03/03/2018   GLUCOSE 91 03/03/2018   CHOL 151 03/03/2018   TRIG 259.0 (H) 03/03/2018   HDL 42.70 03/03/2018   LDLDIRECT 42.0 03/03/2018   ALT 40 (H) 03/03/2018   AST 22 03/03/2018   NA 142 03/03/2018   K 3.2 (L) 03/03/2018   CL 110 03/03/2018   CREATININE 0.88 03/03/2018   BUN 19 03/03/2018   CO2 20 03/03/2018   TSH 1.88 03/03/2018   HGBA1C 7.0 (H) 03/03/2018   MICROALBUR 1.4 12/05/2017    Lab Results  Component Value Date   TSH 1.88 03/03/2018   Lab Results  Component Value Date   WBC 7.6 03/03/2018   HGB 13.9 03/03/2018   HCT 42.5 03/03/2018   MCV 83.4 03/03/2018   PLT 359.0 03/03/2018   Lab Results  Component Value Date   NA 142 03/03/2018   K 3.2 (L) 03/03/2018   CO2 20 03/03/2018  GLUCOSE 91 03/03/2018   BUN 19 03/03/2018   CREATININE 0.88 03/03/2018   BILITOT 0.4 03/03/2018   ALKPHOS 85 03/03/2018   AST 22 03/03/2018   ALT 40 (H) 03/03/2018   PROT 7.4 03/03/2018   ALBUMIN 4.6 03/03/2018   CALCIUM 9.5 03/03/2018   ANIONGAP 8 07/22/2016   GFR 86.31 03/03/2018   Lab Results  Component Value Date   CHOL 151 03/03/2018   Lab Results  Component Value Date   HDL 42.70 03/03/2018    No results found for: [facility] Lab Results  Component Value Date   TRIG 259.0 (H) 03/03/2018   Lab Results  Component Value Date   CHOLHDL 4 03/03/2018   Lab Results  Component Value Date   HGBA1C 7.0 (H) 03/03/2018         Assessment & Plan:   Problem List Items Addressed This Visit    Hyperlipidemia    Encouraged heart healthy diet, increase exercise, avoid trans fats, consider a krill oil cap daily      RESOLVED: Controlled type 2 diabetes mellitus without complication, without long-term current use of insulin (HCC)    hgba1c acceptable, minimize simple carbs. Increase exercise as tolerated. Continue current meds.      Chronic right-sided low back pain with right-sided sciatica    Low back pain with right lower extremity pain. Bad x 2 years since a fall and worsening recently. Encouraged to call insurance to see whom she can see and then we can refer. Request notes from Dr Sherwood Gambler and he recommends surgery due to extra vertebra. Has seen [facility] as well      Relevant Orders   CBC (Completed)   TSH (Completed)   Diabetes mellitus without complication (HCC)    VCBS4H acceptable, minimize simple carbs. Increase exercise as tolerated. If A1C remains at or above 7 at next check, may need to Metformin      Relevant Orders   Hemoglobin A1c (Completed)   Comprehensive metabolic panel (Completed)   TSH (Completed)   Preventative health care    Patient encouraged to maintain heart healthy diet, regular exercise, adequate sleep. Consider daily probiotics. Take medications as prescribed. MGM ordered. Labs reviewed.       Hypokalemia    KCL is prescribed and will recheck.       Acid reflux    Avoid offending foods, start probiotics. Do not eat large meals in late evening and consider raising head of bed. Omeprazole is helpful.        Other Visit Diagnoses    Needs flu shot    -  Primary   Relevant Orders   Flu Vaccine QUAD 6+ mos PF IM (Fluarix Quad PF)  (Completed)   Breast cancer screening       Relevant Orders   MM 3D SCREEN BREAST BILATERAL      I have discontinued Laiba C. Hintz's methocarbamol and naproxen. I am also having her maintain her cetirizine, meloxicam, ibuprofen, mometasone, tretinoin, topiramate, carvedilol, traZODone, amLODipine, potassium chloride SA, losartan-hydrochlorothiazide, FREESTYLE LIBRE 14 DAY SENSOR, predniSONE, and omeprazole.  No orders of the defined types were placed in this encounter.   CMA served as Education administrator during this visit. History, Physical and Plan performed by medical provider. Documentation and orders reviewed and attested to.  Penni Homans, MD

## 2018-03-03 NOTE — Assessment & Plan Note (Signed)
hgba1c acceptable, minimize simple carbs. Increase exercise as tolerated. Continue current meds 

## 2018-03-03 NOTE — Assessment & Plan Note (Signed)
Encouraged heart healthy diet, increase exercise, avoid trans fats, consider a krill oil cap daily 

## 2018-03-03 NOTE — Patient Instructions (Addendum)
Fasting sugars in am between 70 and 130 1-2 hours after eating 100-150  [employer] (EWG)  Shingrix is the new shingles shot. 2 shots over 2-6 months here or at [facility] at work, check insurance regarding   [employer] and confirm which neurosurgeon or orthopaedics are in your network so we can refer.  Ibuprofen/Advil 200 mg tabs, 2 tabs every 4-6 hours max of 2400 mg in 24 hours with food and plenty of water.   Lab work at [facility] on or after 07/04/2018  Preventive Care 53-64 Years, Female Preventive care refers to lifestyle choices and visits with your health care provider that can promote health and wellness. What does preventive care include?  A yearly physical exam. This is also called an annual well check.  Dental exams once or twice a year.  Routine eye exams. Ask your health care provider how often you should have your eyes checked.  Personal lifestyle choices, including: ? Daily care of your teeth and gums. ? Regular physical activity. ? Eating a healthy diet. ? Avoiding tobacco and drug use. ? Limiting alcohol use. ? Practicing safe sex. ? Taking low-dose aspirin daily starting at age 53. ? Taking vitamin and mineral supplements as recommended by your health care provider. What happens during an annual well check? The services and screenings done by your health care provider during your annual well check will depend on your age, overall health, lifestyle risk factors, and family history of disease. Counseling Your health care provider may ask you questions about your:  Alcohol use.  Tobacco use.  Drug use.  Emotional well-being.  Home and relationship well-being.  Sexual activity.  Eating habits.  Work and work Statistician.  Method of birth control.  Menstrual cycle.  Pregnancy history.  Screening You may have the following tests or measurements:  Height, weight, and BMI.  Blood pressure.  Lipid and cholesterol levels. These may  be checked every 5 years, or more frequently if you are over 25 years old.  Skin check.  Lung cancer screening. You may have this screening every year starting at age 53 if you have a 30-pack-year history of smoking and currently smoke or have quit within the past 15 years.  Fecal occult blood test (FOBT) of the stool. You may have this test every year starting at age 53..  Flexible sigmoidoscopy or colonoscopy. You may have a sigmoidoscopy every 5 years or a colonoscopy every 10 years starting at age 53.  Hepatitis C blood test.  Hepatitis B blood test.  Sexually transmitted disease (STD) testing.  Diabetes screening. This is done by checking your blood sugar (glucose) after you have not eaten for a while (fasting). You may have this done every 1-3 years.  Mammogram. This may be done every 1-2 years. Talk to your health care provider about when you should start having regular mammograms. This may depend on whether you have a family history of breast cancer.  BRCA-related cancer screening. This may be done if you have a family history of breast, ovarian, tubal, or peritoneal cancers.  Pelvic exam and Pap test. This may be done every 3 years starting at age 53. Starting at age 53, this may be done every 5 years if you have a Pap test in combination with an HPV test.  Bone density scan. This is done to screen for osteoporosis. You may have this scan if you are at high risk for osteoporosis.  Discuss your test results, treatment options, and if necessary,  the need for more tests with your health care provider. Vaccines Your health care provider may recommend certain vaccines, such as:  Influenza vaccine. This is recommended every year.  Tetanus, diphtheria, and acellular pertussis (Tdap, Td) vaccine. You may need a Td booster every 10 years.  Varicella vaccine. You may need this if you have not been vaccinated.  Zoster vaccine. You may need this after age 53.  Measles, mumps, and  rubella (MMR) vaccine. You may need at least one dose of MMR if you were born in 1957 or later. You may also need a second dose.  Pneumococcal 13-valent conjugate (PCV13) vaccine. You may need this if you have certain conditions and were not previously vaccinated.  Pneumococcal polysaccharide (PPSV23) vaccine. You may need one or two doses if you smoke cigarettes or if you have certain conditions.  Meningococcal vaccine. You may need this if you have certain conditions.  Hepatitis A vaccine. You may need this if you have certain conditions or if you travel or work in places where you may be exposed to hepatitis A.  Hepatitis B vaccine. You may need this if you have certain conditions or if you travel or work in places where you may be exposed to hepatitis B.  Haemophilus influenzae type b (Hib) vaccine. You may need this if you have certain conditions.  Talk to your health care provider about which screenings and vaccines you need and how often you need them. This information is not intended to replace advice given to you by your health care provider. Make sure you discuss any questions you have with your health care provider. Document Released: 07/01/2015 Document Revised: 02/22/2016 Document Reviewed: 04/05/2015 Elsevier Interactive Patient Education  [employer].

## 2018-03-03 NOTE — Assessment & Plan Note (Addendum)
Low back pain with right lower extremity pain. Bad x 2 years since a fall and worsening recently. Encouraged to call insurance to see whom she can see and then we can refer. Request notes from Dr Sherwood Gambler and he recommends surgery due to extra vertebra. Has seen [facility] as well

## 2018-03-04 LAB — COMPREHENSIVE METABOLIC PANEL
ALT: 40 U/L — ABNORMAL HIGH (ref 0–35)
AST: 22 U/L (ref 0–37)
Albumin: 4.6 g/dL (ref 3.5–5.2)
Alkaline Phosphatase: 85 U/L (ref 39–117)
BUN: 19 mg/dL (ref 6–23)
CHLORIDE: 110 meq/L (ref 96–112)
CO2: 20 meq/L (ref 19–32)
Calcium: 9.5 mg/dL (ref 8.4–10.5)
Creatinine, Ser: 0.88 mg/dL (ref 0.40–1.20)
GFR: 86.31 mL/min (ref >60.00)
GLUCOSE: 91 mg/dL (ref 70–99)
Potassium: 3.2 mEq/L — ABNORMAL LOW (ref 3.5–5.1)
SODIUM: 142 meq/L (ref 135–145)
Total Bilirubin: 0.4 mg/dL (ref 0.2–1.2)
Total Protein: 7.4 g/dL (ref 6.0–8.3)

## 2018-03-04 LAB — CBC
HCT: 42.5 % (ref 36.0–46.0)
Hemoglobin: 13.9 g/dL (ref 12.0–15.0)
MCHC: 32.8 g/dL (ref 30.0–36.0)
MCV: 83.4 fl (ref 78.0–100.0)
Platelets: 359 10*3/uL (ref 150.0–400.0)
RBC: 5.1 Mil/uL (ref 3.87–5.11)
RDW: 14 % (ref 11.5–15.5)
WBC: 7.6 10*3/uL (ref 4.0–10.5)

## 2018-03-04 LAB — LIPID PANEL
CHOL/HDL RATIO: 4
Cholesterol: 151 mg/dL (ref 0–200)
HDL: 42.7 mg/dL (ref >39.00)
NONHDL: 108.3
TRIGLYCERIDES: 259 mg/dL — AB (ref 0.0–149.0)
VLDL: 51.8 mg/dL — ABNORMAL HIGH (ref 0.0–40.0)

## 2018-03-04 LAB — TSH: TSH: 1.88 u[IU]/mL (ref 0.35–4.50)

## 2018-03-04 LAB — LDL CHOLESTEROL, DIRECT: Direct LDL: 42 mg/dL

## 2018-03-04 LAB — HEMOGLOBIN A1C: Hgb A1c MFr Bld: 7 % — ABNORMAL HIGH (ref 4.6–6.5)

## 2018-03-05 DIAGNOSIS — Z0000 Encounter for general adult medical examination without abnormal findings: Secondary | ICD-10-CM | POA: Insufficient documentation

## 2018-03-05 DIAGNOSIS — K219 Gastro-esophageal reflux disease without esophagitis: Secondary | ICD-10-CM | POA: Insufficient documentation

## 2018-03-05 DIAGNOSIS — E876 Hypokalemia: Secondary | ICD-10-CM | POA: Insufficient documentation

## 2018-03-05 DIAGNOSIS — Z1231 Encounter for screening mammogram for malignant neoplasm of breast: Secondary | ICD-10-CM | POA: Insufficient documentation

## 2018-03-05 NOTE — Assessment & Plan Note (Signed)
KCL is prescribed and will recheck.

## 2018-03-05 NOTE — Assessment & Plan Note (Signed)
Patient encouraged to maintain heart healthy diet, regular exercise, adequate sleep. Consider daily probiotics. Take medications as prescribed. MGM ordered. Labs reviewed.

## 2018-03-05 NOTE — Assessment & Plan Note (Signed)
hgba1c acceptable, minimize simple carbs. Increase exercise as tolerated. If A1C remains at or above 7 at next check, may need to Metformin

## 2018-03-05 NOTE — Assessment & Plan Note (Signed)
Avoid offending foods, start probiotics. Do not eat large meals in late evening and consider raising head of bed. Omeprazole is helpful.

## 2018-03-18 NOTE — Addendum Note (Signed)
Addended by: Magdalene Molly A on: 03/18/2018 04:46 PM   Modules accepted: Orders

## 2018-04-03 ENCOUNTER — Other Ambulatory Visit: Payer: Self-pay | Admitting: Family

## 2018-04-03 ENCOUNTER — Other Ambulatory Visit: Payer: Self-pay | Admitting: Family Medicine

## 2018-04-03 MED ORDER — TOPIRAMATE 50 MG PO TABS: 50.0000 mg | ORAL_TABLET | Freq: Every day | ORAL | 5 refills | Status: DC

## 2018-04-03 MED FILL — LOSARTAN-HCTZ 100-25 MG TAB: 100-25 | 30 days supply | Qty: 30 | Fill #3

## 2018-04-03 MED FILL — OMEPRAZOLE 40 MG CPDR: 40 | 30 days supply | Qty: 30 | Fill #1

## 2018-04-03 MED FILL — POTASSIUM CL ER 20 MEQ TABL: 20 | 30 days supply | Qty: 30 | Fill #3

## 2018-04-03 MED FILL — TRETINOIN 0.025% CREAM: 0.025 | 30 days supply | Qty: 45 | Fill #2

## 2018-04-03 MED FILL — AMLODIPINE BESYLATE 5 MG TA: 5 | 30 days supply | Qty: 30 | Fill #1

## 2018-04-03 MED FILL — traZODone HCL 50 MG TABS: 50 | 30 days supply | Qty: 30 | Fill #1

## 2018-04-03 NOTE — Telephone Encounter (Signed)
Refills sent

## 2018-04-03 NOTE — Telephone Encounter (Signed)
Copied from [place] [id number]. Topic: Quick Communication - Rx Refill/Question >> Apr 03, 2018  1:49 PM Judyann Munson wrote: Medication:topiramate (TOPAMAX) 50 MG tablet Has the patient contacted their pharmacy?no   Preferred Pharmacy (with phone number or street name): [place], [place] - [place] [phone] (Phone) [phone] (Fax)   Agent: Please be advised that RX refills may take up to 3 business days. We ask that you follow-up with your pharmacy.

## 2018-04-14 ENCOUNTER — Encounter: Payer: Self-pay | Admitting: Family Medicine

## 2018-04-14 ENCOUNTER — Other Ambulatory Visit: Payer: Self-pay | Admitting: Family Medicine

## 2018-04-14 DIAGNOSIS — L600 Ingrowing nail: Secondary | ICD-10-CM

## 2018-04-21 ENCOUNTER — Ambulatory Visit: Payer: No Typology Code available for payment source | Admitting: Family Medicine

## 2018-04-26 ENCOUNTER — Ambulatory Visit: Payer: No Typology Code available for payment source | Admitting: Sports Medicine

## 2018-04-26 ENCOUNTER — Encounter: Payer: Self-pay | Admitting: Sports Medicine

## 2018-04-26 VITALS — BP 120/74 | HR 64 | Resp 16 | Ht 61.0 in | Wt 185.0 lb

## 2018-04-26 DIAGNOSIS — L603 Nail dystrophy: Secondary | ICD-10-CM

## 2018-04-26 DIAGNOSIS — M79675 Pain in left toe(s): Secondary | ICD-10-CM | POA: Diagnosis not present

## 2018-04-26 DIAGNOSIS — L600 Ingrowing nail: Secondary | ICD-10-CM | POA: Diagnosis not present

## 2018-04-26 MED ORDER — NEOMYCIN-POLYMYXIN-HC 3.5-10000-1 OT SOLN: OTIC | 0 refills | Status: DC

## 2018-04-26 NOTE — Patient Instructions (Signed)

## 2018-04-26 NOTE — Progress Notes (Signed)
Subjective: Monique Wiggins is a 52 y.o. female patient presents to office today complaining of a moderately painful incurvated, red, hot, swollen lateral nail border of the 1st toe on the leftfoot. This has been present for a couple of weeks. Patient has treated this by antibiotic cream, a toe apparatus, epsom salt and trimming with no improvement. Patient also reports that she has pain also at left 2nd toenail and is also concerned with the thickness and the shape of how the nail is continuing to grow. Has trimmed with no improvement. Patient denies fever/chills/nausea/vomitting/any other related constitutional symptoms at this time.  Review of Systems  All other systems reviewed and are negative.    Patient Active Problem List   Diagnosis Date Noted  . Preventative health care 03/05/2018  . Hypokalemia 03/05/2018  . Acid reflux 03/05/2018  . Diabetes mellitus without complication ([place])   . 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 Hyperlipidemia 02/22/2017    Current Outpatient Medications on File Prior to Visit  Medication Sig Dispense Refill  . amLODipine (NORVASC) 5 MG tablet Take 1 tablet (5 mg total) by mouth daily. 30 tablet 5  . carvedilol (COREG) 25 MG tablet TAKE 1 TABLET BY MOUTH TWICE DAILY WITH MEALS 28 tablet 0  . cetirizine (ZYRTEC) 10 MG tablet Take 1 tablet (10 mg total) by mouth daily. 30 tablet 11  . Continuous Blood Gluc Sensor (FREESTYLE LIBRE 14 DAY SENSOR) MISC 1 each by Does not apply route every 14 (fourteen) days. 2 each 0  . ibuprofen (ADVIL,MOTRIN) 200 MG tablet Take 800 mg by mouth every 6 (six) hours as needed for mild pain.    Marland Kitchen losartan-hydrochlorothiazide (HYZAAR) 100-25 MG tablet Take 1 tablet by mouth daily. 30 tablet 5  . meloxicam (MOBIC) 7.5 MG tablet Take 1 tablet (7.5 mg total) by mouth daily. 14 tablet 0  . mometasone (ELOCON) 0.1 % cream Apply 1 application topically daily. 45 g 2  . omeprazole (PRILOSEC)  40 MG capsule Take 1 capsule (40 mg total) by mouth daily. 30 capsule 1  . potassium chloride SA (K-DUR,KLOR-CON) 20 MEQ tablet Take 1 tablet (20 mEq total) by mouth daily. 30 tablet 5  . topiramate (TOPAMAX) 50 MG tablet Take 1 tablet (50 mg total) by mouth daily. 30 tablet 5  . traZODone (DESYREL) 50 MG tablet Take 0.5-1 tablets (25-50 mg total) by mouth at bedtime as needed for sleep. 30 tablet 1  . tretinoin (RETIN-A) 0.025 % cream Apply topically at bedtime. 45 g 2   No current facility-administered medications on file prior to visit.     Allergies  Allergen Reactions  . Pollen Extract Swelling  . Shellfish Allergy Swelling    Objective:  Vitals:   04/26/18 1001  Weight: 185 lb (83.9 kg)  Height: 5\' 1"  (1.549 m)    General: Well developed, nourished, in no acute distress, alert and oriented x3   Dermatology: Skin is warm, dry and supple bilateral. Left hallux nail appears to be  severely incurvated with hyperkeratosis formation at the distal aspects of  the  lateral nail border. (+) Erythema. (+) Edema. (-) serosanguous  drainage present. The left 2nd toenail is severely thick and deformed with no acute signs of infection. All other remaining nails appear unremarkable at this time. There are no open sores, lesions or other signs of infection  present.  Vascular: Dorsalis Pedis artery and Posterior Tibial artery pedal pulses are 2/4 bilateral with immedate capillary fill time.  Pedal hair growth present. No lower extremity edema.   Neruologic: Grossly intact via light touch bilateral.  Musculoskeletal: Tenderness to palpation of the Left hallux lateral nail fold and Left 2nd toenail. Muscular strength within normal limits in all groups bilateral.   Assesement and Plan: Problem List Items Addressed This Visit    None    Visit Diagnoses    Ingrown nail    -  Primary   Relevant Medications   neomycin-polymyxin-hydrocortisone (CORTISPORIN) OTIC solution   Nail dystrophy        Relevant Medications   neomycin-polymyxin-hydrocortisone (CORTISPORIN) OTIC solution   Toe pain, left       Relevant Medications   neomycin-polymyxin-hydrocortisone (CORTISPORIN) OTIC solution      -Discussed treatment alternatives and plan of care; Explained permanent/temporary nail avulsion and post procedure course to patient. Patient opts for Left hallux lateral PNA and Left 2nd total PNA - After a verbal consent, injected 3 ml of a 50:50 mixture of 2% plain  lidocaine and 0.5% plain marcaine in a normal hallux and digital block fashion. Next, a  betadine prep was performed. Anesthesia was tested and found to be appropriate.  The offending left hallux lateral nail border and left 2nd toenail in total was then incised from the hyponychium to the epinychium. The offending nails wereremoved and cleared from the field. The areas were curretted for any remaining nail or spicules. Phenol application performed and the areas were then flushed with alcohol and dressed with antibiotic cream and a dry sterile dressing. -Patient was instructed to leave the dressings intact for today and begin soaking  in a weak solution of betadine or Epsom salt and water tomorrow. Patient was instructed to  soak for 15-20 minutes each day and apply neosporin/corticosporin and a gauze or bandaid dressing each day. -Patient was instructed to monitor the toes for signs of infection and return to office if toe becomes red, hot or swollen. -Advised ice, elevation, and tylenol or motrin if needed for pain.  -Patient is to return in 2 weeks for follow up care/nail check or sooner if problems arise.  Landis Martins, DPM

## 2018-04-26 NOTE — Progress Notes (Signed)
   Subjective:    Patient ID: Monique Wiggins, female    DOB: May 23, 1966, 52 y.o.   MRN: [id number]  HPI    Review of Systems  All other systems reviewed and are negative.      Objective:   Physical Exam        Assessment & Plan:

## 2018-04-29 ENCOUNTER — Telehealth: Payer: Self-pay | Admitting: Sports Medicine

## 2018-04-29 MED FILL — TOPIRAMATE 50 MG TABLET: 50 | 30 days supply | Qty: 30 | Fill #0

## 2018-04-29 NOTE — Telephone Encounter (Signed)
Patient called stating that she had 2 toenails removed last week and the toes are swollen/puffy. Pt wants to know if this is normal or not. Please give patient a call.

## 2018-04-29 NOTE — Telephone Encounter (Signed)
Left message informing pt the toes may be red, swollen, burning and stinging and oozing for 3-4 weeks, but the symptoms should decrease the further she was from the procedure date, to continue the epsom salt soaks twice daily and apply the drops and fabric bandaids, that if the symptoms worsen should to call for an appt.

## 2018-04-29 NOTE — Telephone Encounter (Signed)
Unable to leave message on [phone], no extension number was given by pt.

## 2018-05-12 MED FILL — POTASSIUM CHLORIDE CRYS ER: 20 | 30 days supply | Qty: 30 | Fill #4

## 2018-05-12 MED FILL — AMLODIPINE BESYLATE 5 MG TA: 5 | 30 days supply | Qty: 30 | Fill #2

## 2018-05-12 MED FILL — LOSARTAN-HCTZ 100-25 MG TAB: 100-25 | 30 days supply | Qty: 30 | Fill #4

## 2018-05-13 ENCOUNTER — Ambulatory Visit (INDEPENDENT_AMBULATORY_CARE_PROVIDER_SITE_OTHER): Payer: Self-pay

## 2018-05-13 DIAGNOSIS — L600 Ingrowing nail: Secondary | ICD-10-CM

## 2018-05-13 NOTE — Patient Instructions (Signed)

## 2018-05-14 ENCOUNTER — Other Ambulatory Visit: Payer: No Typology Code available for payment source

## 2018-05-19 NOTE — Progress Notes (Signed)
Patient is here today for follow-up appointment, recent procedure performed 04/26/2018, removal of ingrown nail lateral border first toe left foot.  She states that the area is a little tender, but overall she thinks it is healing well.  She said she recently was on her foot right after the procedure for 6 hours, and her foot was swollen, but the swelling subsided after a day or so.  No redness, no erythema, no swelling, no drainage, no other signs symptoms of infection.  Area is healing well and appears to be scabbing over.  Discussed signs and symptoms of infection.  Verbal and written instructions were given to the patient.  She is to follow-up as needed with any acute symptom changes.

## 2018-06-17 ENCOUNTER — Other Ambulatory Visit: Payer: Self-pay | Admitting: Family

## 2018-06-17 MED FILL — POTASSIUM CHLORIDE CRYS ER: 20 | 30 days supply | Qty: 30 | Fill #5

## 2018-06-17 MED FILL — TOPIRAMATE 50 MG TABLET: 50 | 30 days supply | Qty: 30 | Fill #1

## 2018-06-17 MED FILL — LOSARTAN-HCTZ 100-25 MG TAB: 100-25 | 30 days supply | Qty: 30 | Fill #5

## 2018-06-17 MED FILL — AMLODIPINE BESYLATE 5 MG TA: 5 | 30 days supply | Qty: 30 | Fill #3

## 2018-06-30 ENCOUNTER — Encounter: Payer: Self-pay | Admitting: Family Medicine

## 2018-06-30 ENCOUNTER — Ambulatory Visit: Payer: 59 | Admitting: Family Medicine

## 2018-06-30 DIAGNOSIS — E785 Hyperlipidemia, unspecified: Secondary | ICD-10-CM

## 2018-06-30 DIAGNOSIS — E119 Type 2 diabetes mellitus without complications: Secondary | ICD-10-CM

## 2018-06-30 DIAGNOSIS — N951 Menopausal and female climacteric states: Secondary | ICD-10-CM | POA: Diagnosis not present

## 2018-06-30 DIAGNOSIS — Z86718 Personal history of other venous thrombosis and embolism: Secondary | ICD-10-CM | POA: Insufficient documentation

## 2018-06-30 DIAGNOSIS — G8929 Other chronic pain: Secondary | ICD-10-CM

## 2018-06-30 DIAGNOSIS — M5441 Lumbago with sciatica, right side: Secondary | ICD-10-CM | POA: Diagnosis not present

## 2018-06-30 LAB — COMPREHENSIVE METABOLIC PANEL
ALT: 48 U/L — AB (ref 0–35)
AST: 23 U/L (ref 0–37)
Albumin: 4.5 g/dL (ref 3.5–5.2)
Alkaline Phosphatase: 91 U/L (ref 39–117)
BILIRUBIN TOTAL: 0.4 mg/dL (ref 0.2–1.2)
BUN: 17 mg/dL (ref 6–23)
CHLORIDE: 104 meq/L (ref 96–112)
CO2: 27 mEq/L (ref 19–32)
CREATININE: 0.96 mg/dL (ref 0.40–1.20)
Calcium: 9.7 mg/dL (ref 8.4–10.5)
GFR: 77.97 mL/min (ref >60.00)
GLUCOSE: 192 mg/dL — AB (ref 70–99)
Potassium: 3.8 mEq/L (ref 3.5–5.1)
Sodium: 139 mEq/L (ref 135–145)
TOTAL PROTEIN: 7.5 g/dL (ref 6.0–8.3)

## 2018-06-30 LAB — CBC
HEMATOCRIT: 43.7 % (ref 36.0–46.0)
HEMOGLOBIN: 14.5 g/dL (ref 12.0–15.0)
MCHC: 33.1 g/dL (ref 30.0–36.0)
MCV: 82.3 fl (ref 78.0–100.0)
Platelets: 374 10*3/uL (ref 150.0–400.0)
RBC: 5.31 Mil/uL — ABNORMAL HIGH (ref 3.87–5.11)
RDW: 13.9 % (ref 11.5–15.5)
WBC: 7.8 10*3/uL (ref 4.0–10.5)

## 2018-06-30 LAB — LIPID PANEL
CHOL/HDL RATIO: 4
Cholesterol: 164 mg/dL (ref 0–200)
HDL: 45.7 mg/dL (ref >39.00)
NONHDL: 118.63
Triglycerides: 261 mg/dL — ABNORMAL HIGH (ref 0.0–149.0)
VLDL: 52.2 mg/dL — ABNORMAL HIGH (ref 0.0–40.0)

## 2018-06-30 LAB — LDL CHOLESTEROL, DIRECT: LDL DIRECT: 36 mg/dL

## 2018-06-30 LAB — TSH: TSH: 1.53 u[IU]/mL (ref 0.35–4.50)

## 2018-06-30 LAB — HEMOGLOBIN A1C: Hgb A1c MFr Bld: 8 % — ABNORMAL HIGH (ref 4.6–6.5)

## 2018-06-30 MED ORDER — SERTRALINE HCL 25 MG PO TABS: 25.0000 mg | ORAL_TABLET | Freq: Every day | ORAL | 3 refills | Status: DC

## 2018-06-30 MED FILL — SERTRALINE HCL 25 MG TABLET: 25 | 30 days supply | Qty: 60 | Fill #0

## 2018-06-30 NOTE — Assessment & Plan Note (Signed)
Had a DVT after a very long car ride, no trauma. Roughly 2012, will not use HRT for hot flashes

## 2018-06-30 NOTE — Patient Instructions (Signed)

## 2018-06-30 NOTE — Assessment & Plan Note (Signed)
Encouraged heart healthy diet, increase exercise, avoid trans fats, consider a krill oil cap daily 

## 2018-06-30 NOTE — Assessment & Plan Note (Signed)
hgba1c acceptable, minimize simple carbs. Increase exercise as tolerated.  

## 2018-06-30 NOTE — Assessment & Plan Note (Addendum)
Has hot flashes and waves of nausea. It is becoming debilitating and having trouble with sleep and work. Sertaline 25 mg and start with 1 tab daily x 7 days then increase to 2 tabs daily. Minimize caffeine, alcohol, simple carbs. Eat small, frequent meals with lean proteins and healthy fats.

## 2018-06-30 NOTE — Assessment & Plan Note (Signed)
Encouraged moist heat and gentle stretching as tolerated. May try NSAIDs and prescription meds as directed and report if symptoms worsen or seek immediate care. Has an appointment with Dr Cannon Kettle of Ortho soon to discuss

## 2018-07-01 ENCOUNTER — Telehealth: Payer: Self-pay | Admitting: Family Medicine

## 2018-07-01 ENCOUNTER — Other Ambulatory Visit: Payer: Self-pay | Admitting: Family Medicine

## 2018-07-01 ENCOUNTER — Other Ambulatory Visit: Payer: Self-pay

## 2018-07-01 MED ORDER — ATORVASTATIN CALCIUM 10 MG PO TABS: 10.0000 mg | ORAL_TABLET | Freq: Every day | ORAL | 4 refills | Status: DC

## 2018-07-01 MED ORDER — METFORMIN HCL 500 MG PO TABS: 500.0000 mg | ORAL_TABLET | Freq: Every day | ORAL | 5 refills | Status: DC

## 2018-07-01 NOTE — Telephone Encounter (Signed)
Copied from [place] [id number]. Topic: Quick Communication - Rx Refill/Question >> Jul 01, 2018  3:07 PM Scherrie Gerlach wrote: Medication: Metformn 500 mg tabs Lipitor 10 mg po qhs  Pt had mychart message from Dr Charlett Blake to start these new meds, but they were not sent to the pharmacy. Please send to [place], [place] - [place] [phone] (Phone) [phone] (Fax)

## 2018-07-01 NOTE — Telephone Encounter (Signed)
I have sent to pharmacy.

## 2018-07-02 MED FILL — metFORMIN HCL 500 MG TABS: 500 | 30 days supply | Qty: 30 | Fill #0

## 2018-07-02 MED FILL — ATORVASTATIN 10 MG TABLET: 10 | 30 days supply | Qty: 30 | Fill #0

## 2018-07-06 NOTE — Progress Notes (Signed)
Subjective:    Patient ID: Monique Wiggins, female    DOB: Oct 22, 1965, 53 y.o.   MRN: [id number]  No chief complaint on file.   HPI Patient is in today for follow up. She is feeling frustrated with hot flashes and poor sleep. She notes some polyuria as well.no recent febrile illness or recent hospitalizations. Denies CP/palp/SOB/HA/congestion/fevers/GI or GU c/o. Taking meds as prescribed  Past Medical History:  Diagnosis Date  . Allergy   . Anemia   . Diabetes mellitus without complication ([place])   . DVT (deep venous thrombosis) ([place])    after long car trip  . H. pylori infection    1997  . Heart murmur   . Hypertension   . Hypertriglyceridemia 02/22/2017  . Hypertriglyceridemia 02/22/2017  . Migraines   . Sickle cell trait [facility])     Past Surgical History:  Procedure Laterality Date  . ABDOMINAL HYSTERECTOMY     ovaries left in place  . TOE SURGERY     left great toe, repair of severed tendon  . TONSILLECTOMY    . tummy tuck      Family History  Problem Relation Age of Onset  . Diabetes type II Mother   . Hypertension Mother   . Diabetes Mother   . Diabetes Mellitus II Father   . Pulmonary fibrosis Father   . Diabetes Father   . Hypertension Father   . Pulmonary disease Father   . Hypertension Sister   . Diabetes Sister   . Cancer Maternal Grandmother        colon  . Allergic Disorder Son   . Cancer Maternal Grandfather        lung, smoker  . Allergic Disorder Son   . Asthma Son   . Colon cancer Neg Hx   . Esophageal cancer Neg Hx   . Pancreatic cancer Neg Hx   . Stomach cancer Neg Hx     Social History   Socioeconomic History  . Marital status: Married    Spouse name: Not on file  . Number of children: Not on file  . Years of education: Not on file  . Highest education level: Not on file  Occupational History  . Not on file  Social Needs  . Financial resource strain: Not on file  . Food insecurity:    Worry: Not on file    Inability:  Not on file  . Transportation needs:    Medical: Not on file    Non-medical: Not on file  Tobacco Use  . Smoking status: Never Smoker  . Smokeless tobacco: Never Used  Substance and Sexual Activity  . Alcohol use: No    Comment: never  . Drug use: No  . Sexual activity: Yes    Birth control/protection: Surgical  Lifestyle  . Physical activity:    Days per week: Not on file    Minutes per session: Not on file  . Stress: Not on file  Relationships  . Social connections:    Talks on phone: Not on file    Gets together: Not on file    Attends religious service: Not on file    Active member of club or organization: Not on file    Attends meetings of clubs or organizations: Not on file    Relationship status: Not on file  . Intimate partner violence:    Fear of current or ex partner: Not on file    Emotionally abused: Not on file  Physically abused: Not on file    Forced sexual activity: Not on file  Other Topics Concern  . 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 amLODipine (NORVASC) 5 MG tablet Take 1 tablet (5 mg total) by mouth daily. 30 tablet 5  . carvedilol (COREG) 25 MG tablet TAKE 1 TABLET BY MOUTH TWICE DAILY WITH MEALS 30 tablet 5  . cetirizine (ZYRTEC) 10 MG tablet Take 1 tablet (10 mg total) by mouth daily. 30 tablet 11  . Continuous Blood Gluc Sensor (FREESTYLE LIBRE 14 DAY SENSOR) MISC 1 each by Does not apply route every 14 (fourteen) days. 2 each 0  . ibuprofen (ADVIL,MOTRIN) 200 MG tablet Take 800 mg by mouth every 6 (six) hours as  needed for mild pain.    Marland Kitchen losartan-hydrochlorothiazide (HYZAAR) 100-25 MG tablet Take 1 tablet by mouth daily. 30 tablet 5  . meloxicam (MOBIC) 7.5 MG tablet Take 1 tablet (7.5 mg total) by mouth daily. 14 tablet 0  . mometasone (ELOCON) 0.1 % cream Apply 1 application topically daily. 45 g 2  . neomycin-polymyxin-hydrocortisone (CORTISPORIN) OTIC solution Apply 1-2 drops to toes twice a day until all gone 10 mL 0  . omeprazole (PRILOSEC) 40 MG capsule Take 1 capsule (40 mg total) by mouth daily. 30 capsule 1  . potassium chloride SA (K-DUR,KLOR-CON) 20 MEQ tablet Take 1 tablet (20 mEq total) by mouth daily. 30 tablet 5  . topiramate (TOPAMAX) 50 MG tablet Take 1 tablet (50 mg total) by mouth daily. 30 tablet 5  . traZODone (DESYREL) 50 MG tablet Take 0.5-1 tablets (25-50 mg total) by mouth at bedtime as needed for sleep. 30 tablet 1  . tretinoin (RETIN-A) 0.025 % cream APPLY TO THE AFFECTED AREA(S) AT BEDTIME 45 g 2   No facility-administered medications prior to visit.     Allergies  Allergen Reactions  . Pollen Extract Swelling  . Shellfish Allergy Swelling    Review of Systems  Constitutional: Positive for malaise/fatigue. Negative for fever.  HENT: Negative for congestion.   Eyes: Negative for blurred vision.  Respiratory: Negative for shortness of breath.   Cardiovascular: Negative for chest pain, palpitations and leg swelling.  Gastrointestinal: Negative for abdominal pain, blood in stool and nausea.  Genitourinary: Negative for dysuria and frequency.  Musculoskeletal: Positive for back pain. Negative for falls.  Skin: Negative for rash.  Neurological: Negative for dizziness, loss of consciousness and headaches.  Endo/Heme/Allergies: Negative for environmental allergies.  Psychiatric/Behavioral: Negative for depression. The patient is nervous/anxious and has insomnia.        Objective:    Physical Exam Vitals signs and nursing note reviewed.  Constitutional:       General: She is not in acute distress.    Appearance: She is well-developed.  HENT:     Head: Normocephalic and atraumatic.     Nose: Nose normal.  Eyes:     General:        Right eye: No  discharge.        Left eye: No discharge.  Neck:     Musculoskeletal: Normal range of motion and neck supple.  Cardiovascular:     Rate and Rhythm: Normal rate and regular rhythm.     Heart sounds: No murmur.  Pulmonary:     Effort: Pulmonary effort is normal.     Breath sounds: Normal breath sounds.  Abdominal:     General: Bowel sounds are normal.     Palpations: Abdomen is soft.     Tenderness: There is no abdominal tenderness.  Skin:    General: Skin is warm and dry.  Neurological:     Mental Status: She is alert and oriented to person, place, and time.     BP 118/62 (BP Location: Left Arm, Patient Position: Sitting, Cuff Size: Normal)   Pulse 66   Temp 98.6 F (37 C) (Oral)   Resp 18   Wt 199 lb (90.3 kg)   SpO2 97%   BMI 37.60 kg/m  Wt Readings from Last 3 Encounters:  06/30/18 199 lb (90.3 kg)  04/26/18 185 lb (83.9 kg)  03/03/18 191 lb 12.8 oz (87 kg)     Lab Results  Component Value Date   WBC 7.8 06/30/2018   HGB 14.5 06/30/2018   HCT 43.7 06/30/2018   PLT 374.0 06/30/2018   GLUCOSE 192 (H) 06/30/2018   CHOL 164 06/30/2018   TRIG 261.0 (H) 06/30/2018   HDL 45.70 06/30/2018   LDLDIRECT 36.0 06/30/2018   ALT 48 (H) 06/30/2018   AST 23 06/30/2018   NA 139 06/30/2018   K 3.8 06/30/2018   CL 104 06/30/2018   CREATININE 0.96 06/30/2018   BUN 17 06/30/2018   CO2 27 06/30/2018   TSH 1.53 06/30/2018   HGBA1C 8.0 (H) 06/30/2018   MICROALBUR 1.4 12/05/2017    Lab Results  Component Value Date   TSH 1.53 06/30/2018   Lab Results  Component Value Date   WBC 7.8 06/30/2018   HGB 14.5 06/30/2018   HCT 43.7 06/30/2018   MCV 82.3 06/30/2018   PLT 374.0 06/30/2018   Lab Results  Component Value Date   NA 139 06/30/2018   K 3.8 06/30/2018   CO2 27 06/30/2018    GLUCOSE 192 (H) 06/30/2018   BUN 17 06/30/2018   CREATININE 0.96 06/30/2018   BILITOT 0.4 06/30/2018   ALKPHOS 91 06/30/2018   AST 23 06/30/2018   ALT 48 (H) 06/30/2018   PROT 7.5 06/30/2018   ALBUMIN 4.5 06/30/2018   CALCIUM 9.7 06/30/2018   ANIONGAP 8 07/22/2016   GFR 77.97 06/30/2018   Lab Results  Component Value Date   CHOL 164 06/30/2018   Lab Results  Component Value Date   HDL 45.70 06/30/2018   No results found for: [facility] Lab Results  Component Value Date   TRIG 261.0 (H) 06/30/2018   Lab Results  Component Value Date   CHOLHDL 4 06/30/2018   Lab Results  Component Value Date   HGBA1C 8.0 (H) 06/30/2018       Assessment & Plan:   Problem List Items Addressed This Visit    Hyperlipidemia    Encouraged heart healthy diet, increase exercise, avoid trans fats, consider a krill oil cap daily      Relevant Orders   Lipid panel (Completed)   Chronic right-sided low back pain with right-sided sciatica    Encouraged moist heat and gentle stretching as tolerated. May try NSAIDs and prescription meds as directed  and report if symptoms worsen or seek immediate care. Has an appointment with Dr Cannon Kettle of Ortho soon to discuss      Relevant Medications   sertraline (ZOLOFT) 25 MG tablet   Other Relevant Orders   CBC (Completed)   Diabetes mellitus without complication ([place])    [id number] acceptable, minimize simple carbs. Increase exercise as tolerated.       Relevant Orders   Hemoglobin A1c (Completed)   Comprehensive metabolic panel (Completed)   TSH (Completed)   Perimenopause    Has hot flashes and waves of nausea. It is becoming debilitating and having trouble with sleep and work. Sertaline 25 mg and start with 1 tab daily x 7 days then increase to 2 tabs daily. Minimize caffeine, alcohol, simple carbs. Eat small, frequent meals with lean proteins and healthy fats.       History of DVT (deep vein thrombosis)    Had a DVT after a very long car ride, no  trauma. Roughly 2012, will not use HRT for hot flashes         I am having Almeda C. Dasher start on sertraline. I am also having her maintain her cetirizine, meloxicam, ibuprofen, mometasone, traZODone, amLODipine, potassium chloride SA, losartan-hydrochlorothiazide, FREESTYLE LIBRE 14 DAY SENSOR, omeprazole, topiramate, neomycin-polymyxin-hydrocortisone, carvedilol, and tretinoin.  Meds ordered this encounter  Medications  . sertraline (ZOLOFT) 25 MG tablet    Sig: Take 1-2 tablets (25-50 mg total) by mouth daily.    Dispense:  60 tablet    Refill:  3     Penni Homans, MD

## 2018-07-07 ENCOUNTER — Ambulatory Visit: Payer: No Typology Code available for payment source | Admitting: Family Medicine

## 2018-07-14 DIAGNOSIS — R200 Anesthesia of skin: Secondary | ICD-10-CM | POA: Diagnosis not present

## 2018-07-14 DIAGNOSIS — M47816 Spondylosis without myelopathy or radiculopathy, lumbar region: Secondary | ICD-10-CM | POA: Diagnosis not present

## 2018-07-21 ENCOUNTER — Other Ambulatory Visit: Payer: Self-pay | Admitting: Family

## 2018-07-21 MED FILL — TOPIRAMATE 50 MG TABLET: 50 | 30 days supply | Qty: 30 | Fill #2

## 2018-07-21 MED FILL — AMLODIPINE BESYLATE 5 MG TA: 5 | 30 days supply | Qty: 30 | Fill #4

## 2018-07-21 MED FILL — POTASSIUM CHLORIDE CRYS ER: 20 | 30 days supply | Qty: 30 | Fill #0

## 2018-07-21 MED FILL — LOSARTAN-HCTZ 100-25 MG TAB: 100-25 | 30 days supply | Qty: 30 | Fill #0

## 2018-08-01 MED FILL — ATORVASTATIN 10 MG TABLET: 10 | 30 days supply | Qty: 30 | Fill #1

## 2018-08-01 MED FILL — SERTRALINE HCL 25 MG TABLET: 25 | 30 days supply | Qty: 60 | Fill #1

## 2018-08-01 MED FILL — metFORMIN HCL 500 MG TABS: 500 | 30 days supply | Qty: 30 | Fill #1

## 2018-08-13 DIAGNOSIS — G5603 Carpal tunnel syndrome, bilateral upper limbs: Secondary | ICD-10-CM | POA: Diagnosis not present

## 2018-08-13 DIAGNOSIS — G5601 Carpal tunnel syndrome, right upper limb: Secondary | ICD-10-CM | POA: Diagnosis not present

## 2018-08-13 DIAGNOSIS — G5602 Carpal tunnel syndrome, left upper limb: Secondary | ICD-10-CM | POA: Diagnosis not present

## 2018-08-20 ENCOUNTER — Telehealth: Payer: 59 | Admitting: Nurse Practitioner

## 2018-08-20 DIAGNOSIS — J029 Acute pharyngitis, unspecified: Secondary | ICD-10-CM | POA: Diagnosis not present

## 2018-08-20 MED ORDER — AMOXICILLIN-POT CLAVULANATE 875-125 MG PO TABS: 1.0000 | ORAL_TABLET | Freq: Two times a day (BID) | ORAL | 0 refills | Status: DC

## 2018-08-20 MED FILL — AMOX-CLAV 875-125 MG TABLET: 875-125 | 7 days supply | Qty: 14 | Fill #0

## 2018-08-20 NOTE — Progress Notes (Signed)
We are sorry that you are not feeling well.  Here is how we plan to help!  Based on what you have shared with me it is likely that you have strep pharyngitis.  Strep pharyngitis is inflammation and infection in the back of the throat.  This is an infection cause by bacteria and is treated with antibiotics.  I have prescribed Augmentin 875 mg/ 125 mg one tablet twice daily for 7 days. For throat pain, we recommend over the counter oral pain relief medications such as acetaminophen or aspirin, or anti-inflammatory medications such as ibuprofen or naproxen sodium. Topical treatments such as oral throat lozenges or sprays may be used as needed. Strep infections are not as easily transmitted as other respiratory infections, however we still recommend that you avoid close contact with loved ones, especially the very young and elderly.  Remember to wash your hands thoroughly throughout the day as this is the number one way to prevent the spread of infection and wipe down door knobs and counters with disinfectant.  * treated with antibiotic due to medical history.\  Home Care:  Only take medications as instructed by your medical team.  Complete the entire course of an antibiotic.  Do not take these medications with alcohol.  A steam or ultrasonic humidifier can help congestion.  You can place a towel over your head and breathe in the steam from hot water coming from a faucet.  Avoid close contacts especially the very young and the elderly.  Cover your mouth when you cough or sneeze.  Always remember to wash your hands.  Get Help Right Away If:  You develop worsening fever or sinus pain.  You develop a severe head ache or visual changes.  Your symptoms persist after you have completed your treatment plan.  Make sure you  Understand these instructions.  Will watch your condition.  Will get help right away if you are not doing well or get worse.  Your e-visit answers were reviewed by a  board certified advanced clinical practitioner to complete your personal care plan.  Depending on the condition, your plan could have included both over the counter or prescription medications.  If there is a problem please reply  once you have received a response from your provider.  Your safety is important to [name].  If you have drug allergies check your prescription carefully.    You can use MyChart to ask questions about today's visit, request a non-urgent call back, or ask for a work or school excuse for 24 hours related to this e-Visit. If it has been greater than 24 hours you will need to follow up with your provider, or enter a new e-Visit to address those concerns.  You will get an e-mail in the next two days asking about your experience.  I hope that your e-visit has been valuable and will speed your recovery. Thank you for using e-visits.   5 minutes spent reviewing and documenting in chart.

## 2018-08-22 ENCOUNTER — Ambulatory Visit (HOSPITAL_BASED_OUTPATIENT_CLINIC_OR_DEPARTMENT_OTHER)
Admission: RE | Admit: 2018-08-22 | Discharge: 2018-08-22 | Disposition: A | Discharge status: Home / Self Care | Payer: 59 | Source: Ambulatory Visit | Attending: Medical | Admitting: Medical

## 2018-08-22 ENCOUNTER — Ambulatory Visit: Payer: 59 | Admitting: Medical

## 2018-08-22 ENCOUNTER — Encounter: Payer: Self-pay | Admitting: Medical

## 2018-08-22 ENCOUNTER — Telehealth: Payer: Self-pay | Admitting: Medical

## 2018-08-22 VITALS — BP 136/87 | HR 65 | Temp 98.4°F | Resp 16 | Ht 61.0 in | Wt 200.4 lb

## 2018-08-22 DIAGNOSIS — R059 Cough, unspecified: Secondary | ICD-10-CM

## 2018-08-22 DIAGNOSIS — R05 Cough: Secondary | ICD-10-CM | POA: Insufficient documentation

## 2018-08-22 DIAGNOSIS — R195 Other fecal abnormalities: Secondary | ICD-10-CM

## 2018-08-22 DIAGNOSIS — J029 Acute pharyngitis, unspecified: Secondary | ICD-10-CM

## 2018-08-22 DIAGNOSIS — R52 Pain, unspecified: Secondary | ICD-10-CM | POA: Diagnosis not present

## 2018-08-22 DIAGNOSIS — J40 Bronchitis, not specified as acute or chronic: Secondary | ICD-10-CM | POA: Diagnosis not present

## 2018-08-22 MED ORDER — AZITHROMYCIN 250 MG PO TABS: ORAL_TABLET | ORAL | 0 refills | Status: DC

## 2018-08-22 MED ORDER — CEFTRIAXONE SODIUM 1 G IJ SOLR
1.0000 g | Freq: Once | INTRAMUSCULAR | Status: AC
  Administered 2018-08-22: 1 g via INTRAMUSCULAR

## 2018-08-22 MED ORDER — FLUTICASONE PROPIONATE 50 MCG/ACT NA SUSP: 2.0000 | Freq: Every day | NASAL | 1 refills | Status: DC

## 2018-08-22 MED ORDER — ALBUTEROL SULFATE HFA 108 (90 BASE) MCG/ACT IN AERS: 2.0000 | INHALATION_SPRAY | Freq: Four times a day (QID) | RESPIRATORY_TRACT | 2 refills | Status: DC | PRN

## 2018-08-22 MED ORDER — BENZONATATE 100 MG PO CAPS: 100.0000 mg | ORAL_CAPSULE | Freq: Three times a day (TID) | ORAL | 0 refills | Status: DC | PRN

## 2018-08-22 MED FILL — AZITHROMYCIN 250 MG TABLET: 250 | 5 days supply | Qty: 6 | Fill #0

## 2018-08-22 MED FILL — BENZONATATE 100 MG CAP: 100 | 10 days supply | Qty: 30 | Fill #0

## 2018-08-22 MED FILL — FLUTICASONE PROP 50 MCG SPR: 50 | 30 days supply | Qty: 16 | Fill #0

## 2018-08-22 NOTE — Telephone Encounter (Signed)
Rx albuterol sent to pt pharmacy. 

## 2018-08-22 NOTE — Progress Notes (Signed)
Subjective:    Patient ID: Monique Wiggins, female    DOB: 04-09-1966, 53 y.o.   MRN: [id number]  HPI Pt in with about 4 days of feeling sick. She has some body aches, st, sasal drainage, chest congestion(after sleeping) and productive cough.(some blood tinged mucus this morning)  Last 2 days loose stools.(4-5 loose stools a day). Not watery stools then states sometimes.  Has been on antibiotic for 2 days.(loose stools seemed to coincide with augmentin.)  Pt works in radiation oncology.  Pt body aches started on Wednesday. Pt got flu vaccine this year.  Pt has been taking motrin every 6-8 hours.  Overall she feel little better overall.   No fevers presently. At time chills.  No recent travel.   Review of Systems  Constitutional: Negative for chills, fatigue and fever.  HENT: Positive for congestion and sore throat. Negative for ear pain, mouth sores, postnasal drip and sneezing.        Throat hurts less know. At beginning hurt to even swallow.  Eyes: Negative for photophobia and pain.  Respiratory: Positive for cough. Negative for chest tightness, shortness of breath and wheezing.   Cardiovascular: Negative for chest pain and palpitations.  Gastrointestinal: Positive for diarrhea. Negative for abdominal pain and blood in stool.  Genitourinary: Negative for difficulty urinating, dysuria, flank pain and frequency.  Musculoskeletal: Positive for myalgias. Negative for back pain.  Skin: Negative for pallor and rash.  Neurological: Negative for dizziness, syncope, weakness and headaches.  Hematological: Negative for adenopathy. Does not bruise/bleed easily.  Psychiatric/Behavioral: Negative for behavioral problems, confusion and hallucinations. The patient is not nervous/anxious.     Past Medical History:  Diagnosis Date  . Allergy   . Anemia   . Diabetes mellitus without complication ([place])   . DVT (deep venous thrombosis) ([place])    after long car trip  . H. pylori  infection    1997  . Heart murmur   . Hypertension   . Hypertriglyceridemia 02/22/2017  . Hypertriglyceridemia 02/22/2017  . Migraines   . Sickle cell trait ([place])      Social History   Socioeconomic History  . Marital status: Married    Spouse name: Not on file  . Number of children: Not on file  . Years of education: Not on file  . Highest education level: Not on file  Occupational History  . Not on file  Social Needs  . Financial resource strain: Not on file  . Food insecurity:    Worry: Not on file    Inability: Not on file  . Transportation needs:    Medical: Not on file    Non-medical: Not on file  Tobacco Use  . Smoking status: Never Smoker  . Smokeless tobacco: Never Used  Substance and Sexual Activity  . Alcohol use: No    Comment: never  . Drug use: No  . Sexual activity: Yes    Birth control/protection: Surgical  Lifestyle  . Physical activity:    Days per week: Not on file    Minutes per session: Not on file  . Stress: Not on file  Relationships  . Social connections:    Talks on phone: Not on file    Gets together: Not on file    Attends religious service: Not on file    Active member of club or organization: Not on file    Attends meetings of clubs or organizations: Not on file    Relationship status: Not on file  .  Intimate partner violence:    Fear of current or ex partner: Not on file    Emotionally abused: Not on file    Physically abused: Not on file    Forced sexual activity: Not on file  Other Topics Concern  . 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 ABDOMINAL HYSTERECTOMY     ovaries left in place  . TOE SURGERY     left great toe, repair of severed tendon  . TONSILLECTOMY    . tummy tuck      Family History  Problem Relation Age of Onset  . Diabetes type II Mother   . Hypertension Mother   . Diabetes Mother   . Diabetes Mellitus II Father   . Pulmonary fibrosis Father   . Diabetes Father   . Hypertension Father   . Pulmonary disease Father   . Hypertension Sister   . Diabetes Sister   . Cancer Maternal Grandmother        colon  . Allergic Disorder Son   . Cancer Maternal Grandfather        lung, smoker  . Allergic Disorder Son   . Asthma Son   . Colon cancer Neg Hx   . Esophageal cancer Neg Hx   . Pancreatic cancer Neg Hx   . Stomach cancer Neg Hx     Allergies  Allergen Reactions  . Pollen Extract Swelling  . Shellfish Allergy Swelling    Current Outpatient Medications on File Prior to Visit  Medication Sig Dispense Refill  . amLODipine (NORVASC) 5 MG tablet Take 1 tablet (5 mg total) by mouth daily. 30 tablet 5  . amoxicillin-clavulanate (AUGMENTIN) 875-125 MG tablet Take 1 tablet by mouth 2 (two) times daily. 14 tablet 0  . atorvastatin (LIPITOR) 10 MG tablet Take 1 tablet (10 mg total) by mouth daily. 30 tablet 4  . carvedilol (COREG) 25 MG tablet TAKE 1 TABLET BY MOUTH TWICE DAILY WITH MEALS 30 tablet 5  . cetirizine (ZYRTEC) 10 MG tablet Take 1 tablet (10 mg total) by mouth daily. 30 tablet 11  . Continuous Blood Gluc Sensor (FREESTYLE LIBRE 14 DAY SENSOR) MISC 1 each by Does not apply route every 14 (fourteen) days. 2 each 0  . ibuprofen (ADVIL,MOTRIN) 200 MG tablet Take 800 mg by mouth every 6 (six) hours as needed for mild pain.    Marland Kitchen losartan-hydrochlorothiazide (HYZAAR) 100-25 MG tablet TAKE 1 TABLET BY MOUTH DAILY. 30 tablet 5  . meloxicam (MOBIC) 7.5 MG tablet Take 1 tablet (7.5 mg total) by mouth daily. 14 tablet 0  . metFORMIN (GLUCOPHAGE) 500 MG tablet Take 1 tablet (500 mg  total) by mouth daily with breakfast. 30 tablet 5  . mometasone (ELOCON) 0.1 % cream Apply 1 application topically daily. 45 g 2  . neomycin-polymyxin-hydrocortisone (CORTISPORIN) OTIC solution Apply 1-2 drops to toes twice a day until all gone 10 mL 0  . omeprazole (PRILOSEC) 40 MG capsule Take 1 capsule (40 mg  total) by mouth daily. 30 capsule 1  . potassium chloride SA (K-DUR,KLOR-CON) 20 MEQ tablet TAKE 1 TABLET (20 MEQ TOTAL) BY MOUTH DAILY. 30 tablet 5  . sertraline (ZOLOFT) 25 MG tablet Take 1-2 tablets (25-50 mg total) by mouth daily. 60 tablet 3  . topiramate (TOPAMAX) 50 MG tablet Take 1 tablet (50 mg total) by mouth daily. 30 tablet 5  . traZODone (DESYREL) 50 MG tablet Take 0.5-1 tablets (25-50 mg total) by mouth at bedtime as needed for sleep. 30 tablet 1  . tretinoin (RETIN-A) 0.025 % cream APPLY TO THE AFFECTED AREA(S) AT BEDTIME 45 g 2   No current facility-administered medications on file prior to visit.     BP 136/87   Pulse 65   Temp 98.4 F (36.9 C) (Oral)   Resp 16   Ht 5' 1" (1.549 m)   Wt 200 lb 6.4 oz (90.9 kg)   SpO2 100%   BMI 37.87 kg/m       Objective:   Physical Exam   General  Mental Status - Alert. General Appearance - Well groomed. Not in acute distress.  Skin Rashes- No Rashes.  HEENT Head- Normal. Ear Auditory Canal - Left- Normal. Right - Normal.Tympanic Membrane- Left- Normal. Right- mild rt tm central redness. Eye Sclera/Conjunctiva- Left- Normal. Right- Normal. Nose & Sinuses Nasal Mucosa- Left-  Boggy and Congested. Right-  Boggy and  Congested.Bilateral no  maxillary and no  frontal sinus pressure. Mouth & Throat Lips: Upper Lip- Normal: no dryness, cracking, pallor, cyanosis, or vesicular eruption. Lower Lip-Normal: no dryness, cracking, pallor, cyanosis or vesicular eruption. Buccal Mucosa- Bilateral- No Aphthous ulcers. Oropharynx- No Discharge or Erythema. Tonsils: Characteristics- Bilateral- mild redness.(uvula normal but by  prior pictures was swollen) or Congestion. Size/Enlargement- Bilateral- No enlargement. Discharge- bilateral-None.  Neck Neck- Supple. No Masses.   Chest and Lung Exam Auscultation: Breath Sounds:-Clear even and unlabored.  Cardiovascular Auscultation:Rythm- Regular, rate and rhythm. Murmurs & Other Heart Sounds:Ausculatation of the heart reveal- No Murmurs.  Lymphatic Head & Neck General Head & Neck Lymphatics: Bilateral: Description- No Localized lymphadenopathy.      Assessment & Plan:  By exam and recent history, your presentation is suspicious for possible strep throat with ear infection.  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  We gave you Rocephin 1 g IM injection today and I prescribed a azithromycin to use in place of Augmentin.  You did have loose stool onset with use of Augmentin.  Unfortunately some GI side effects to any antibiotic.  Recommend eating bland foods and hydrating well over the weekend.  If by Monday you report loose stools worsening/being consistently watery then would advise you to come our office to pick up stool panel kit to check for various bacteria including C. Difficile.  For body aches continue with ibuprofen.  For cough, prescribed benzonatate. For nasal congestion I prescribed Flonase.  Return to work on Monday provided that she are without fever and feeling better.  However this advisement which changes x-ray showed pneumonia.  Follow-up in 7 to 10 days or as needed.  Mackie Pai, PA-C

## 2018-08-22 NOTE — Patient Instructions (Signed)
By exam and recent history, your presentation is suspicious for possible strep throat with ear infection.  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  We gave you Rocephin 1 g IM injection today and I prescribed a azithromycin to use in place of Augmentin.  You did have loose stool onset with use of Augmentin.  Unfortunately some GI side effects to any antibiotic.  Recommend eating bland foods and hydrating well over the weekend.  If by Monday you report loose stools worsening/being consistently watery then would advise you to come our office to pick up stool panel kit to check for various bacteria including C. Difficile.  For body aches continue with ibuprofen.  For cough, prescribed benzonatate. For nasal congestion I prescribed Flonase.  Return to work on Monday provided that she are without fever and feeling better.  However this advisement which changes x-ray showed pneumonia.  Follow-up in 7 to 10 days or as needed.

## 2018-08-28 ENCOUNTER — Other Ambulatory Visit: Payer: Self-pay

## 2018-08-28 ENCOUNTER — Encounter (INDEPENDENT_AMBULATORY_CARE_PROVIDER_SITE_OTHER): Payer: No Typology Code available for payment source

## 2018-08-29 MED FILL — SERTRALINE HCL 25 MG TABLET: 25 | 30 days supply | Qty: 60 | Fill #2

## 2018-08-29 MED FILL — metFORMIN HCL 500 MG TABS: 500 | 30 days supply | Qty: 30 | Fill #2

## 2018-08-29 MED FILL — HYDROCHLOROTHIAZIDE 25 MG T: 25 | 30 days supply | Qty: 30 | Fill #0

## 2018-08-29 MED FILL — AMLODIPINE BESYLATE 5 MG TA: 5 | 30 days supply | Qty: 30 | Fill #5

## 2018-08-29 MED FILL — TOPIRAMATE 50 MG TABLET: 50 | 30 days supply | Qty: 30 | Fill #3

## 2018-08-29 MED FILL — LOSARTAN POTASSIUM 100 MG T: 100 | 30 days supply | Qty: 30 | Fill #0

## 2018-08-29 MED FILL — POTASSIUM CHLORIDE CRYS ER: 20 | 30 days supply | Qty: 30 | Fill #1

## 2018-09-05 MED FILL — ATORVASTATIN 10 MG TABLET: 10 | 90 days supply | Qty: 90 | Fill #2

## 2018-09-11 ENCOUNTER — Ambulatory Visit (INDEPENDENT_AMBULATORY_CARE_PROVIDER_SITE_OTHER): Payer: No Typology Code available for payment source | Admitting: Bariatrics

## 2018-09-17 ENCOUNTER — Other Ambulatory Visit: Payer: Self-pay | Admitting: Family Medicine

## 2018-09-17 ENCOUNTER — Other Ambulatory Visit: Payer: Self-pay | Admitting: Family

## 2018-09-17 MED FILL — POTASSIUM CHLORIDE CRYS ER: 20 | 30 days supply | Qty: 30 | Fill #2

## 2018-09-17 MED FILL — metFORMIN HCL 500 MG TABS: 500 | 30 days supply | Qty: 30 | Fill #3

## 2018-09-17 MED FILL — TOPIRAMATE 50 MG TABLET: 50 | 30 days supply | Qty: 30 | Fill #4

## 2018-09-17 MED FILL — LOSARTAN POTASSIUM 100 MG T: 100 | 30 days supply | Qty: 30 | Fill #1

## 2018-09-17 MED FILL — HYDROCHLOROTHIAZIDE 25 MG T: 25 | 30 days supply | Qty: 30 | Fill #1

## 2018-09-22 MED FILL — AMLODIPINE BESYLATE 5 MG TA: 5 | 30 days supply | Qty: 30 | Fill #0

## 2018-09-25 ENCOUNTER — Ambulatory Visit (INDEPENDENT_AMBULATORY_CARE_PROVIDER_SITE_OTHER): Payer: No Typology Code available for payment source | Admitting: Bariatrics

## 2018-09-30 ENCOUNTER — Ambulatory Visit: Payer: 59 | Admitting: Family Medicine

## 2018-10-06 ENCOUNTER — Ambulatory Visit: Payer: 59 | Admitting: Family Medicine

## 2018-10-09 ENCOUNTER — Other Ambulatory Visit: Payer: Self-pay

## 2018-10-09 ENCOUNTER — Ambulatory Visit (INDEPENDENT_AMBULATORY_CARE_PROVIDER_SITE_OTHER): Payer: 59 | Admitting: Family Medicine

## 2018-10-09 DIAGNOSIS — E119 Type 2 diabetes mellitus without complications: Secondary | ICD-10-CM

## 2018-10-09 DIAGNOSIS — E669 Obesity, unspecified: Secondary | ICD-10-CM

## 2018-10-09 DIAGNOSIS — E785 Hyperlipidemia, unspecified: Secondary | ICD-10-CM

## 2018-10-09 DIAGNOSIS — J40 Bronchitis, not specified as acute or chronic: Secondary | ICD-10-CM | POA: Insufficient documentation

## 2018-10-09 MED ORDER — AMLODIPINE BESYLATE 5 MG PO TABS: 5.0000 mg | ORAL_TABLET | Freq: Every day | ORAL | 1 refills | Status: DC

## 2018-10-09 MED ORDER — SERTRALINE HCL 50 MG PO TABS: 50.0000 mg | ORAL_TABLET | Freq: Every day | ORAL | 1 refills | Status: DC

## 2018-10-09 MED ORDER — ALBUTEROL SULFATE HFA 108 (90 BASE) MCG/ACT IN AERS: 2.0000 | INHALATION_SPRAY | Freq: Four times a day (QID) | RESPIRATORY_TRACT | 2 refills | Status: DC | PRN

## 2018-10-09 MED ORDER — LOSARTAN POTASSIUM-HCTZ 100-25 MG PO TABS: 1.0000 | ORAL_TABLET | Freq: Every day | ORAL | 1 refills | Status: DC

## 2018-10-09 MED ORDER — METFORMIN HCL 500 MG PO TABS: 500.0000 mg | ORAL_TABLET | Freq: Every day | ORAL | 1 refills | Status: DC

## 2018-10-09 MED ORDER — CARVEDILOL 25 MG PO TABS: 25.0000 mg | ORAL_TABLET | Freq: Two times a day (BID) | ORAL | 1 refills | Status: DC

## 2018-10-09 MED ORDER — ATORVASTATIN CALCIUM 10 MG PO TABS: 10.0000 mg | ORAL_TABLET | Freq: Every day | ORAL | 1 refills | Status: DC

## 2018-10-09 MED FILL — ALBUTEROL SULFATE HFA 108 (: 108 (90 BAS | 25 days supply | Qty: 9 | Fill #0

## 2018-10-09 MED FILL — metFORMIN HCL 500 MG TABS: 500 | 90 days supply | Qty: 90 | Fill #0

## 2018-10-09 MED FILL — AMLODIPINE BESYLATE 5 MG TA: 5 | 90 days supply | Qty: 90 | Fill #0

## 2018-10-09 MED FILL — LOSARTAN-HCTZ 100-25 MG TAB: 100-25 | 90 days supply | Qty: 90 | Fill #0

## 2018-10-09 MED FILL — CARVEDILOL 25 MG TABLET: 25 | 90 days supply | Qty: 180 | Fill #0

## 2018-10-09 MED FILL — SERTRALINE HCL 50 MG TABLET: 50 | 90 days supply | Qty: 90 | Fill #0

## 2018-10-09 NOTE — Assessment & Plan Note (Signed)
Encouraged heart healthy diet, increase exercise, avoid trans fats 

## 2018-10-09 NOTE — Assessment & Plan Note (Signed)
hgba1c acceptable, minimize simple carbs. Increase exercise as tolerated. Continue current meds 

## 2018-10-09 NOTE — Assessment & Plan Note (Signed)
Is improving but is given a refill on Albuterol to prn

## 2018-10-12 DIAGNOSIS — E669 Obesity, unspecified: Secondary | ICD-10-CM | POA: Insufficient documentation

## 2018-10-12 NOTE — Assessment & Plan Note (Signed)
Encouraged DASH diet, decrease po intake and increase exercise as tolerated. Needs 7-8 hours of sleep nightly. Avoid trans fats, eat small, frequent meals every 4-5 hours with lean proteins, complex carbs and healthy fats. Minimize simple carbs, was set up with [facility] and was about to get started when Covid hit. She is still interested so referral renewed.

## 2018-10-12 NOTE — Progress Notes (Signed)
Virtual Visit via Video Note  I connected with Monique Wiggins on 10/12/18 at  8:40 AM EDT by a video enabled telemedicine application and verified that I am speaking with the correct person using two identifiers.   I discussed the limitations of evaluation and management by telemedicine and the availability of in person appointments. The patient expressed understanding and agreed to proceed. Magdalene Molly, CMA was able to get patient set up on video platform    Subjective:    Patient ID: Monique Wiggins, female    DOB: 20-Oct-1965, 53 y.o.   MRN: [id number]  No chief complaint on file.   HPI Patient is in today for follow up on hyperlipidemia, diabetes, obesity and more. She feels well today. No recent febrile illness or hospitalizations. Is interviewed form her home she does continue to work. No c/o polyuria or polydipsia. She is frustrated with weight. She was set up with [facility] and was about to get started when Covid hit. So has not started yet. Denies CP/palp/SOB/HA/congestion/fevers/GI or GU c/o. Taking meds as prescribed  Past Medical History:  Diagnosis Date  . Allergy   . Anemia   . Diabetes mellitus without complication ([place])   . DVT (deep venous thrombosis) ([place])    after long car trip  . H. pylori infection    1997  . Heart murmur   . Hypertension   . Hypertriglyceridemia 02/22/2017  . Hypertriglyceridemia 02/22/2017  . Migraines   . Sickle cell trait [facility])     Past Surgical History:  Procedure Laterality Date  . ABDOMINAL HYSTERECTOMY     ovaries left in place  . TOE SURGERY     left great toe, repair of severed tendon  . TONSILLECTOMY    . tummy tuck      Family History  Problem Relation Age of Onset  . Diabetes type II Mother   . Hypertension Mother   . Diabetes Mother   . Diabetes Mellitus II Father   . Pulmonary fibrosis Father   . Diabetes Father   . Hypertension Father   . Pulmonary disease Father   .  Hypertension Sister   . Diabetes Sister   . Cancer Maternal Grandmother        colon  . Allergic Disorder Son   . Cancer Maternal Grandfather        lung, smoker  . Allergic Disorder Son   . Asthma Son   . Colon cancer Neg Hx   . Esophageal cancer Neg Hx   . Pancreatic cancer Neg Hx   . Stomach cancer Neg Hx     Social History   Socioeconomic History  . Marital status: Married    Spouse name: Not on file  . Number of children: Not on file  . Years of education: Not on file  . Highest education level: Not on file  Occupational History  . Not on file  Social Needs  . Financial resource strain: Not on file  . Food insecurity:    Worry: Not on file    Inability: Not on file  . Transportation needs:    Medical: Not on file    Non-medical: Not on file  Tobacco Use  . Smoking status: Never Smoker  . Smokeless tobacco: Never Used  Substance and Sexual Activity  . Alcohol use: No    Comment: never  . Drug use: No  . Sexual activity: Yes    Birth control/protection: Surgical  Lifestyle  . Physical  activity:    Days per week: Not on file    Minutes per session: Not on file  . Stress: Not on file  Relationships  . Social connections:    Talks on phone: Not on file    Gets together: Not on file    Attends religious service: Not on file    Active member of club or organization: Not on file    Attends meetings of clubs or organizations: Not on file    Relationship status: Not on file  . Intimate partner violence:    Fear of current or ex partner: Not on file    Emotionally abused: Not on file    Physically abused: Not on file    Forced sexual activity: Not on file  Other Topics Concern  . 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 cetirizine (ZYRTEC) 10 MG tablet Take 1 tablet (10 mg total) by mouth daily. 30 tablet 11  . Continuous Blood Gluc Sensor (FREESTYLE LIBRE 14 DAY SENSOR) MISC 1 each by Does not apply route every 14 (fourteen) days. 2 each 0  . fluticasone (FLONASE) 50 MCG/ACT nasal spray Place 2 sprays into both nostrils daily. 16 g 1  . ibuprofen (ADVIL,MOTRIN) 200 MG tablet Take 800 mg by mouth every 6 (six) hours as needed for mild pain.    . mometasone (ELOCON) 0.1 % cream Apply 1 application topically daily. 45 g 2  . neomycin-polymyxin-hydrocortisone (CORTISPORIN) OTIC solution Apply 1-2 drops to toes twice a day until all gone 10 mL 0  . omeprazole (PRILOSEC) 40 MG capsule Take 1 capsule (40 mg total) by mouth daily. 30 capsule 1  . potassium chloride SA (K-DUR,KLOR-CON) 20 MEQ tablet TAKE 1 TABLET (20 MEQ TOTAL) BY MOUTH DAILY. 30 tablet 5  . topiramate (TOPAMAX) 50 MG tablet Take 1 tablet (50 mg total) by mouth daily. 30 tablet 5  . tretinoin (RETIN-A) 0.025 % cream APPLY TO THE AFFECTED AREA(S) AT BEDTIME 45 g 2  . albuterol (PROVENTIL HFA;VENTOLIN HFA) 108 (90 Base) MCG/ACT inhaler Inhale 2 puffs into the lungs every 6 (six) hours as needed for wheezing or shortness of breath. 1 Inhaler 2  . amLODipine (NORVASC) 5 MG tablet TAKE 1 TABLET (5 MG TOTAL) BY MOUTH DAILY. 30 tablet 3  . atorvastatin (LIPITOR) 10 MG tablet TAKE 1 TABLET (10 MG TOTAL) BY MOUTH DAILY. 30 tablet 4  . azithromycin (ZITHROMAX) 250 MG tablet Take 2 tablets by mouth on day 1, followed by 1 tablet by mouth daily for 4 days. 6 tablet 0  . benzonatate (TESSALON) 100 MG capsule Take 1 capsule (100 mg total) by mouth 3 (three) times daily as needed for cough. 30 capsule 0  . carvedilol (COREG) 25 MG tablet TAKE 1 TABLET BY MOUTH TWICE DAILY WITH MEALS  30 tablet 5  . losartan-hydrochlorothiazide (HYZAAR) 100-25 MG tablet TAKE 1 TABLET BY MOUTH DAILY. 30 tablet 5  . meloxicam (MOBIC) 7.5 MG tablet  Take 1 tablet (7.5 mg total) by mouth daily. 14 tablet 0  . metFORMIN (GLUCOPHAGE) 500 MG tablet Take 1 tablet (500 mg total) by mouth daily with breakfast. 30 tablet 5  . sertraline (ZOLOFT) 25 MG tablet Take 1-2 tablets (25-50 mg total) by mouth daily. 60 tablet 3  . traZODone (DESYREL) 50 MG tablet Take 0.5-1 tablets (25-50 mg total) by mouth at bedtime as needed for sleep. 30 tablet 1   No facility-administered medications prior to visit.     Allergies  Allergen Reactions  . Pollen Extract Swelling  . Shellfish Allergy Swelling    Review of Systems  Constitutional: Negative for fever and malaise/fatigue.  HENT: Negative for congestion.   Eyes: Negative for blurred vision.  Respiratory: Negative for shortness of breath.   Cardiovascular: Negative for chest pain, palpitations and leg swelling.  Gastrointestinal: Negative for abdominal pain, blood in stool and nausea.  Genitourinary: Negative for dysuria and frequency.  Musculoskeletal: Negative for falls.  Skin: Negative for rash.  Neurological: Negative for dizziness, loss of consciousness and headaches.  Endo/Heme/Allergies: Negative for environmental allergies.  Psychiatric/Behavioral: Negative for depression. The patient is not nervous/anxious.        Objective:    Physical Exam Constitutional:      Appearance: Normal appearance. She is not ill-appearing.  HENT:     Head: Normocephalic and atraumatic.     Nose: Nose normal.  Pulmonary:     Effort: Pulmonary effort is normal.  Neurological:     Mental Status: She is alert and oriented to person, place, and time.  Psychiatric:        Mood and Affect: Mood normal.        Behavior: Behavior normal.     BP (!) 141/82   Pulse 66  Wt Readings from Last 3 Encounters:  08/22/18 200 lb 6.4 oz (90.9 kg)  06/30/18 199 lb  (90.3 kg)  04/26/18 185 lb (83.9 kg)    Diabetic Foot Exam - Simple   No data filed     Lab Results  Component Value Date   WBC 7.8 06/30/2018   HGB 14.5 06/30/2018   HCT 43.7 06/30/2018   PLT 374.0 06/30/2018   GLUCOSE 192 (H) 06/30/2018   CHOL 164 06/30/2018   TRIG 261.0 (H) 06/30/2018   HDL 45.70 06/30/2018   LDLDIRECT 36.0 06/30/2018   ALT 48 (H) 06/30/2018   AST 23 06/30/2018   NA 139 06/30/2018   K 3.8 06/30/2018   CL 104 06/30/2018   CREATININE 0.96 06/30/2018   BUN 17 06/30/2018   CO2 27 06/30/2018   TSH 1.53 06/30/2018   HGBA1C 8.0 (H) 06/30/2018   MICROALBUR 1.4 12/05/2017    Lab Results  Component Value Date   TSH 1.53 06/30/2018   Lab Results  Component Value Date   WBC 7.8 06/30/2018   HGB 14.5 06/30/2018   HCT 43.7 06/30/2018   MCV 82.3 06/30/2018   PLT 374.0 06/30/2018   Lab Results  Component Value Date   NA 139 06/30/2018   K 3.8 06/30/2018   CO2 27 06/30/2018   GLUCOSE 192 (H) 06/30/2018   BUN 17 06/30/2018   CREATININE 0.96 06/30/2018   BILITOT 0.4 06/30/2018   ALKPHOS 91 06/30/2018   AST 23 06/30/2018   ALT 48 (H) 06/30/2018   PROT 7.5 06/30/2018   ALBUMIN 4.5 06/30/2018   CALCIUM 9.7 06/30/2018   ANIONGAP 8 07/22/2016   GFR 77.97 06/30/2018   Lab Results  Component Value  Date   CHOL 164 06/30/2018   Lab Results  Component Value Date   HDL 45.70 06/30/2018   No results found for: [facility] Lab Results  Component Value Date   TRIG 261.0 (H) 06/30/2018   Lab Results  Component Value Date   CHOLHDL 4 06/30/2018   Lab Results  Component Value Date   HGBA1C 8.0 (H) 06/30/2018       Assessment & Plan:   Problem List Items Addressed This Visit    Hyperlipidemia    Encouraged heart healthy diet, increase exercise, avoid trans fats.      Relevant Medications   carvedilol (COREG) 25 MG tablet   atorvastatin (LIPITOR) 10 MG tablet   amLODipine (NORVASC) 5 MG tablet   losartan-hydrochlorothiazide (HYZAAR) 100-25 MG  tablet   Diabetes mellitus without complication (HCC)    [id number] acceptable, minimize simple carbs. Increase exercise as tolerated. Continue current meds      Relevant Medications   atorvastatin (LIPITOR) 10 MG tablet   losartan-hydrochlorothiazide (HYZAAR) 100-25 MG tablet   metFORMIN (GLUCOPHAGE) 500 MG tablet   Bronchitis    Is improving but is given a refill on Albuterol to prn      Obesity    Encouraged DASH diet, decrease po intake and increase exercise as tolerated. Needs 7-8 hours of sleep nightly. Avoid trans fats, eat small, frequent meals every 4-5 hours with lean proteins, complex carbs and healthy fats. Minimize simple carbs, was set up with [facility] and was about to get started when Covid hit. She is still interested so referral renewed.       Relevant Medications   metFORMIN (GLUCOPHAGE) 500 MG tablet      I have discontinued Monique Wiggins's meloxicam, traZODone, sertraline, azithromycin, and benzonatate. I have also changed her carvedilol and albuterol. Additionally, I am having her start on sertraline. Lastly, I am having her maintain her cetirizine, ibuprofen, mometasone, FreeStyle Libre 14 Day Sensor, omeprazole, topiramate, neomycin-polymyxin-hydrocortisone, tretinoin, potassium chloride SA, fluticasone, atorvastatin, amLODipine, losartan-hydrochlorothiazide, and metFORMIN.  Meds ordered this encounter  Medications  . carvedilol (COREG) 25 MG tablet    Sig: Take 1 tablet (25 mg total) by mouth 2 (two) times daily with a meal.    Dispense:  180 tablet    Refill:  1  . atorvastatin (LIPITOR) 10 MG tablet    Sig: Take 1 tablet (10 mg total) by mouth daily.    Dispense:  90 tablet    Refill:  1  . amLODipine (NORVASC) 5 MG tablet    Sig: Take 1 tablet (5 mg total) by mouth daily.    Dispense:  90 tablet    Refill:  1  . losartan-hydrochlorothiazide (HYZAAR) 100-25 MG tablet    Sig: Take 1 tablet by mouth daily.    Dispense:  90 tablet     Refill:  1  . metFORMIN (GLUCOPHAGE) 500 MG tablet    Sig: Take 1 tablet (500 mg total) by mouth daily with breakfast.    Dispense:  90 tablet    Refill:  1  . albuterol (VENTOLIN HFA) 108 (90 Base) MCG/ACT inhaler    Sig: Inhale 2 puffs into the lungs every 6 (six) hours as needed for wheezing or shortness of breath.    Dispense:  1 Inhaler    Refill:  2  . sertraline (ZOLOFT) 50 MG tablet    Sig: Take 1 tablet (50 mg total) by mouth daily.    Dispense:  90 tablet  Refill:  1    I discussed the assessment and treatment plan with the patient. The patient was provided an opportunity to ask questions and all were answered. The patient agreed with the plan and demonstrated an understanding of the instructions.   The patient was advised to call back or seek an in-person evaluation if the symptoms worsen or if the condition fails to improve as anticipated.  I provided 25 minutes of non-face-to-face time during this encounter.   Penni Homans, MD

## 2018-10-31 ENCOUNTER — Telehealth: Payer: 59 | Admitting: Family

## 2018-10-31 DIAGNOSIS — N390 Urinary tract infection, site not specified: Secondary | ICD-10-CM

## 2018-10-31 DIAGNOSIS — B3731 Acute candidiasis of vulva and vagina: Secondary | ICD-10-CM

## 2018-10-31 DIAGNOSIS — B373 Candidiasis of vulva and vagina: Secondary | ICD-10-CM | POA: Diagnosis not present

## 2018-10-31 MED ORDER — CEPHALEXIN 500 MG PO CAPS: 500.0000 mg | ORAL_CAPSULE | Freq: Two times a day (BID) | ORAL | 0 refills | Status: DC

## 2018-10-31 MED ORDER — FLUCONAZOLE 150 MG PO TABS: 150.0000 mg | ORAL_TABLET | Freq: Once | ORAL | 0 refills | Status: AC

## 2018-10-31 MED FILL — FLUCONAZOLE 150 MG TABS: 150 | 1 days supply | Qty: 1 | Fill #0

## 2018-10-31 MED FILL — CEPHALEXIN 500 MG CAPSULE: 500 | 7 days supply | Qty: 14 | Fill #0

## 2018-10-31 NOTE — Progress Notes (Signed)
Greater than 5 minutes, yet less than 10 minutes of time have been spent researching, coordinating, and implementing care for this patient today.  Thank you for the details you included in the comment boxes. Those details are very helpful in determining the best course of treatment for you and help [name] to provide the best care.  We are sorry that you are not feeling well.  Here is how we plan to help!  I have sent Diflucan 150mg  x 1 dose; only use this if you develop a yeast infection.   -------------------------------------------------------  Based on what you shared with me it looks like you most likely have a simple urinary tract infection.  A UTI (Urinary Tract Infection) is a bacterial infection of the bladder.  Most cases of urinary tract infections are simple to treat but a key part of your care is to encourage you to drink plenty of fluids and watch your symptoms carefully.  I have prescribed Keflex 500 mg twice a day for 7 days.  Your symptoms should gradually improve. Call us if the burning in your urine worsens, you develop worsening fever, back pain or pelvic pain or if your symptoms do not resolve after completing the antibiotic.  Urinary tract infections can be prevented by drinking plenty of water to keep your body hydrated.  Also be sure when you wipe, wipe from front to back and don't hold it in!  If possible, empty your bladder every 4 hours.  Your e-visit answers were reviewed by a board certified advanced clinical practitioner to complete your personal care plan.  Depending on the condition, your plan could have included both over the counter or prescription medications.  If there is a problem please reply  once you have received a response from your provider.  Your safety is important to [name].  If you have drug allergies check your prescription carefully.    You can use MyChart to ask questions about today's visit, request a non-urgent call back, or ask for a work or  school excuse for 24 hours related to this e-Visit. If it has been greater than 24 hours you will need to follow up with your provider, or enter a new e-Visit to address those concerns.   You will get an e-mail in the next two days asking about your experience.  I hope that your e-visit has been valuable and will speed your recovery. Thank you for using e-visits.

## 2018-11-07 ENCOUNTER — Other Ambulatory Visit: Payer: Self-pay

## 2018-11-07 ENCOUNTER — Ambulatory Visit (INDEPENDENT_AMBULATORY_CARE_PROVIDER_SITE_OTHER): Payer: 59 | Admitting: Internal Medicine

## 2018-11-07 DIAGNOSIS — G8929 Other chronic pain: Secondary | ICD-10-CM | POA: Diagnosis not present

## 2018-11-07 DIAGNOSIS — M545 Low back pain: Secondary | ICD-10-CM | POA: Diagnosis not present

## 2018-11-07 MED ORDER — MELOXICAM 15 MG PO TABS: 15.0000 mg | ORAL_TABLET | Freq: Every day | ORAL | 0 refills | Status: DC | PRN

## 2018-11-07 NOTE — Progress Notes (Signed)
Subjective:    Patient ID: Monique Wiggins, female    DOB: 1966/03/30, 53 y.o.   MRN: [id number]  DOS:  11/07/2018 Type of visit - description: Virtual Visit via Video Note  I connected with@ on 11/07/18 at  3:40 PM EDT by a video enabled telemedicine application and verified that I am speaking with the correct person using two identifiers.   THIS ENCOUNTER IS A VIRTUAL VISIT DUE TO COVID-19 - PATIENT WAS NOT SEEN IN THE OFFICE. PATIENT HAS CONSENTED TO VIRTUAL VISIT / TELEMEDICINE VISIT   Location of patient: home  Location of provider: office  I discussed the limitations of evaluation and management by telemedicine and the availability of in person appointments. The patient expressed understanding and agreed to proceed.  History of Present Illness:   Acute visit The patient has a history of chronic back pain and right sciatic pain. He has seen Dr. Mina Marble - orthopedics-  Was  recommended a local  injection before, it was not pursued due to insurance issues. She continue with pain and request of prednisone prescription which helped in the past.  No records from Dr. Mina Marble  available but I see a note from neurosurgery back from 07-2017.   Review of Systems Reports no bladder or bowel incontinence No motor deficits No saddle incontinence. Recently diagnosed with a UTI, she finished Keflex today  Past Medical History:  Diagnosis Date  . Allergy   . Anemia   . Diabetes mellitus without complication ([place])   . DVT (deep venous thrombosis) ([place])    after long car trip  . H. pylori infection    1997  . Heart murmur   . Hypertension   . Hypertriglyceridemia 02/22/2017  . Hypertriglyceridemia 02/22/2017  . Migraines   . Sickle cell trait [facility])     Past Surgical History:  Procedure Laterality Date  . ABDOMINAL HYSTERECTOMY     ovaries left in place  . TOE SURGERY     left great toe, repair of severed tendon  . TONSILLECTOMY    . tummy tuck      Social History    Socioeconomic History  . Marital status: Married    Spouse name: Not on file  . Number of children: Not on file  . Years of education: Not on file  . Highest education level: Not on file  Occupational History  . Not on file  Social Needs  . Financial resource strain: Not on file  . Food insecurity:    Worry: Not on file    Inability: Not on file  . Transportation needs:    Medical: Not on file    Non-medical: Not on file  Tobacco Use  . Smoking status: Never Smoker  . Smokeless tobacco: Never Used  Substance and Sexual Activity  . Alcohol use: No    Comment: never  . Drug use: No  . Sexual activity: Yes    Birth control/protection: Surgical  Lifestyle  . Physical activity:    Days per week: Not on file    Minutes per session: Not on file  . Stress: Not on file  Relationships  . Social connections:    Talks on phone: Not on file    Gets together: Not on file    Attends religious service: Not on file    Active member of club or organization: Not on file    Attends meetings of clubs or organizations: Not on file    Relationship status: Not on file  .  Intimate partner violence:    Fear of current or ex partner: Not on file    Emotionally abused: Not on file    Physically abused: Not on file    Forced sexual activity: Not on file  Other Topics Concern  . 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 If you have any questions, ask your nurse or doctor.         albuterol 108 (90 Base) MCG/ACT inhaler Commonly known as:  VENTOLIN HFA Inhale 2 puffs into the lungs every 6 (six) hours as needed for wheezing or shortness of breath.   amLODipine 5 MG tablet Commonly known as:  NORVASC Take 1 tablet (5 mg total) by mouth daily.   atorvastatin 10 MG tablet Commonly known as:  LIPITOR Take 1 tablet (10 mg total) by mouth daily.   carvedilol 25 MG tablet Commonly known as:  COREG Take 1 tablet (25 mg total) by mouth 2 (two) times daily with a meal.   cephALEXin 500 MG capsule Commonly known as:  KEFLEX Take 1 capsule (500 mg total) by mouth 2 (two) times daily.   cetirizine 10 MG tablet Commonly known as:  ZYRTEC Take 1 tablet (10 mg total) by mouth daily.   fluticasone 50 MCG/ACT nasal spray Commonly known as:  FLONASE Place 2 sprays into both nostrils daily.   FreeStyle Libre 14 Day Sensor Misc 1 each by Does not apply route every 14 (fourteen) days.   ibuprofen 200 MG tablet Commonly known as:  ADVIL Take 800 mg by mouth every 6 (six) hours as needed for mild pain.   losartan-hydrochlorothiazide 100-25 MG tablet Commonly known as:  HYZAAR Take 1 tablet by mouth daily.   metFORMIN 500 MG tablet Commonly known as:  GLUCOPHAGE Take 1 tablet (500 mg total) by mouth daily with breakfast.   mometasone 0.1 % cream Commonly known as:  ELOCON Apply 1 application topically daily.   neomycin-polymyxin-hydrocortisone OTIC solution Commonly known as:  CORTISPORIN Apply 1-2 drops to toes twice a day until all gone   omeprazole 40 MG capsule Commonly known as:  PRILOSEC Take 1 capsule (40 mg total) by mouth daily.   potassium chloride SA 20 MEQ tablet Commonly known as:  K-DUR TAKE 1 TABLET (20 MEQ TOTAL) BY MOUTH DAILY.   sertraline 50 MG tablet Commonly known as:  ZOLOFT Take 1 tablet (50 mg total) by mouth daily.   topiramate 50 MG tablet Commonly known as:  TOPAMAX Take 1 tablet (50 mg total) by mouth daily.    tretinoin 0.025 % cream Commonly known as:  RETIN-A APPLY TO THE AFFECTED AREA(S) AT BEDTIME           Objective:   Physical Exam There were no vitals taken for this visit. This is  a virtual video visit, communication was somewhat impair by poor sound    Assessment    53 year old female, PMH includes DM, HTN, hyperlipidemia, migraines, on multiple meds, normal kidney function per last CMP, last A1c 8.0 on January 2020, presents with:  Chronic low back pain, right sciatic pain: Chronic issue with no red flag symptoms such as bladder or bowel incontinence or saddle anesthesia, she request pain management by sending her a RX  for prednisone. I am hesitant to do that, last A1c was 8.0. She has a history of good tolerance to NSAIDs,  I mentioned meloxicam and she said that in the past it did help to some extent. Plan:  Meloxicam daily, GI precautions discussed, hold ibuprofen Rest Warm compresses Tylenol in addition to meloxicam  is okay Pt to call PCP next week to see if it is okay to take  Prednisone.    I discussed the assessment and treatment plan with the patient. The patient was provided an opportunity to ask questions and all were answered. The patient agreed with the plan and demonstrated an understanding of the instructions.   The patient was advised to call back or seek an in-person evaluation if the symptoms worsen or if the condition fails to improve as anticipated.

## 2018-11-12 MED FILL — TOPIRAMATE 50 MG TABLET: 50 | 30 days supply | Qty: 30 | Fill #5

## 2018-11-17 ENCOUNTER — Ambulatory Visit (INDEPENDENT_AMBULATORY_CARE_PROVIDER_SITE_OTHER): Payer: No Typology Code available for payment source | Admitting: Bariatrics

## 2018-11-27 DIAGNOSIS — G5602 Carpal tunnel syndrome, left upper limb: Secondary | ICD-10-CM | POA: Diagnosis not present

## 2018-11-27 DIAGNOSIS — G5601 Carpal tunnel syndrome, right upper limb: Secondary | ICD-10-CM | POA: Diagnosis not present

## 2018-11-27 DIAGNOSIS — M1811 Unilateral primary osteoarthritis of first carpometacarpal joint, right hand: Secondary | ICD-10-CM | POA: Diagnosis not present

## 2018-12-01 ENCOUNTER — Ambulatory Visit (INDEPENDENT_AMBULATORY_CARE_PROVIDER_SITE_OTHER): Payer: 59 | Admitting: Bariatrics

## 2018-12-01 ENCOUNTER — Other Ambulatory Visit: Payer: Self-pay

## 2018-12-01 ENCOUNTER — Encounter (INDEPENDENT_AMBULATORY_CARE_PROVIDER_SITE_OTHER): Payer: Self-pay | Admitting: Bariatrics

## 2018-12-01 VITALS — BP 128/82 | HR 56 | Temp 98.3°F | Ht 62.0 in | Wt 193.0 lb

## 2018-12-01 DIAGNOSIS — Z9189 Other specified personal risk factors, not elsewhere classified: Secondary | ICD-10-CM

## 2018-12-01 DIAGNOSIS — E119 Type 2 diabetes mellitus without complications: Secondary | ICD-10-CM

## 2018-12-01 DIAGNOSIS — I10 Essential (primary) hypertension: Secondary | ICD-10-CM

## 2018-12-01 DIAGNOSIS — G43809 Other migraine, not intractable, without status migrainosus: Secondary | ICD-10-CM

## 2018-12-01 DIAGNOSIS — E559 Vitamin D deficiency, unspecified: Secondary | ICD-10-CM

## 2018-12-01 DIAGNOSIS — R5383 Other fatigue: Secondary | ICD-10-CM

## 2018-12-01 DIAGNOSIS — F3289 Other specified depressive episodes: Secondary | ICD-10-CM | POA: Diagnosis not present

## 2018-12-01 DIAGNOSIS — Z6835 Body mass index (BMI) 35.0-35.9, adult: Secondary | ICD-10-CM

## 2018-12-01 DIAGNOSIS — R0602 Shortness of breath: Secondary | ICD-10-CM | POA: Diagnosis not present

## 2018-12-01 DIAGNOSIS — Z0289 Encounter for other administrative examinations: Secondary | ICD-10-CM

## 2018-12-02 LAB — HEMOGLOBIN A1C
Est. average glucose Bld gHb Est-mCnc: 174 mg/dL
Hgb A1c MFr Bld: 7.7 % — ABNORMAL HIGH (ref 4.8–5.6)

## 2018-12-02 LAB — LIPID PANEL WITH LDL/HDL RATIO
Cholesterol, Total: 123 mg/dL (ref 100–199)
HDL: 59 mg/dL (ref >39)
LDL Calculated: 25 mg/dL (ref 0–99)
LDl/HDL Ratio: 0.4 ratio (ref 0.0–3.2)
Triglycerides: 197 mg/dL — ABNORMAL HIGH (ref 0–149)
VLDL Cholesterol Cal: 39 mg/dL (ref 5–40)

## 2018-12-02 LAB — COMPREHENSIVE METABOLIC PANEL
ALT: 45 IU/L — ABNORMAL HIGH (ref 0–32)
AST: 18 IU/L (ref 0–40)
Albumin/Globulin Ratio: 1.8 (ref 1.2–2.2)
Albumin: 4.6 g/dL (ref 3.8–4.9)
Alkaline Phosphatase: 99 IU/L (ref 39–117)
BUN/Creatinine Ratio: 19 (ref 9–23)
BUN: 17 mg/dL (ref 6–24)
Bilirubin Total: 0.4 mg/dL (ref 0.0–1.2)
CO2: 21 mmol/L (ref 20–29)
Calcium: 9.8 mg/dL (ref 8.7–10.2)
Chloride: 101 mmol/L (ref 96–106)
Creatinine, Ser: 0.89 mg/dL (ref 0.57–1.00)
GFR calc Af Amer: 85 mL/min/{1.73_m2} (ref >59)
GFR calc non Af Amer: 74 mL/min/{1.73_m2} (ref >59)
Globulin, Total: 2.6 g/dL (ref 1.5–4.5)
Glucose: 157 mg/dL — ABNORMAL HIGH (ref 65–99)
Potassium: 3.5 mmol/L (ref 3.5–5.2)
Sodium: 137 mmol/L (ref 134–144)
Total Protein: 7.2 g/dL (ref 6.0–8.5)

## 2018-12-02 LAB — INSULIN, RANDOM: INSULIN: 39.9 u[IU]/mL — ABNORMAL HIGH (ref 2.6–24.9)

## 2018-12-02 LAB — VITAMIN D 25 HYDROXY (VIT D DEFICIENCY, FRACTURES): Vit D, 25-Hydroxy: 105 ng/mL — ABNORMAL HIGH (ref 30.0–100.0)

## 2018-12-03 NOTE — Progress Notes (Signed)
Office: [phone]  /  Fax: [phone]   Dear Dr. Charlett Wiggins,   Thank you for referring Monique Wiggins to our clinic. The following note includes my evaluation and treatment recommendations.  HPI:   Chief Complaint: OBESITY    Monique Wiggins has been referred by Monique Wiggins for consultation regarding her obesity and obesity related comorbidities.    Monique Wiggins (MR# [id number]) is a 53 y.o. female who presents on 12/01/2018 for obesity evaluation and treatment. Current BMI is Body mass index is 35.3 kg/m.Marland Kitchen Monique Wiggins has been struggling with her weight for many years and has been unsuccessful in either losing weight, maintaining weight loss, or reaching her healthy weight goal.     Monique Wiggins attended our information session and states she is currently in the action stage of change and ready to dedicate time achieving and maintaining a healthier weight. Monique Wiggins is interested in becoming our patient and working on intensive lifestyle modifications including (but not limited to) diet, exercise and weight loss.    Monique Wiggins states her family eats meals together she thinks her family will eat healthier with  her her desired weight loss is 53 lbs. she started gaining weight in the last 3 years her heaviest weight ever was 199 lbs. she craves bread she snacks between meals she sometimes skips breakfast she frequently makes poor food choices she has problems with excessive hunger  she frequently eats larger portions than normal  she has binge eating behaviors she struggles with emotional eating    Monique Wiggins feels her energy is lower than it should be. This has worsened with weight gain and has not worsened recently. Monique Wiggins admits to daytime somnolence and she admits to waking up still tired. Patient is at risk for obstructive sleep apnea. Patent has a history of symptoms of daytime Monique, morning Monique and morning headache. Patient generally gets 3 hours  of sleep per night, and states they generally have restless sleep. Snoring is present. Apneic episodes are not present. Epworth Sleepiness Score is 11  Monique Wiggins with exercising and seems to be worsening over time with weight gain. She notes getting out of Wiggins sooner with certain activities than she used to. This has not gotten worse recently. Monique Wiggins denies orthopnea.  Hypertension Monique Wiggins is a 53 y.o. female with hypertension. She is taking Amlodipine, Losartan-HCTZ and Carvedilol. Monique Wiggins denies chest pain. She is working weight loss to help control her blood pressure with the goal of decreasing her risk of heart attack and stroke. Monique Wiggins blood pressure is well controlled.  Diabetes II Monique Wiggins has a diagnosis of diabetes type II. She was diagnosed in November/December 2019. Monique Wiggins is taking metformin. She denies any hypoglycemic episodes. Last A1c was at 8.0 She has been working on intensive lifestyle modifications including diet, exercise, and weight loss to help control her blood glucose levels.  Migraines Monique Wiggins has a history of migraines and she is taking Topamax.  Vitamin D deficiency Monique Wiggins has a diagnosis of vitamin D deficiency. She is currently taking vit D and denies nausea, vomiting or muscle weakness.  At risk for osteopenia and osteoporosis Monique Wiggins is at higher risk of osteopenia and osteoporosis due to vitamin D deficiency.   Depression with emotional eating behaviors Monique Wiggins is struggling with emotional and boredom eating and using food for comfort to the extent that it is negatively impacting her health. She often snacks when she is not hungry. Monique Wiggins sometimes feels  she is out of control and then feels guilty that she made poor food choices. She is attempting to work on behavior modification techniques to help reduce her emotional eating. She shows no sign of suicidal or homicidal  ideations. PHQ-9 Score is 14  Depression Screen Monique Wiggins's Food and Mood (modified PHQ-9) score was  Depression screen PHQ 2/9 12/01/2018  Decreased Interest 3  Down, Depressed, Hopeless 1  PHQ - 2 Score 4  Altered sleeping 3  Tired, decreased energy 3  Change in appetite 1  Feeling bad or failure about yourself  1  Trouble concentrating 2  Moving slowly or fidgety/restless 0  Suicidal thoughts 0  PHQ-9 Score 14  Difficult doing work/chores Not difficult at all    ASSESSMENT AND PLAN:  Other Monique - Plan: EKG 12-Lead, Comprehensive metabolic panel  Shortness of Wiggins on exertion - Plan: Comprehensive metabolic panel, Lipid Panel With LDL/HDL Ratio  Essential hypertension - Plan: Comprehensive metabolic panel  Type 2 diabetes mellitus without complication, without long-term current use of insulin (HCC) - Plan: Comprehensive metabolic panel, Hemoglobin A1c, Insulin, random  Other migraine without status migrainosus, not intractable  Other depression  Vitamin D deficiency - Plan: VITAMIN D 25 Hydroxy (Vit-D Deficiency, Fractures)  At risk for osteoporosis  Class 2 severe obesity with serious comorbidity and body mass index (BMI) of 35.0 to 35.9 in adult, unspecified obesity type (HCC)  PLAN:  Monique Wiggins was informed that her Monique may be related to obesity, depression or many other causes. Labs will be ordered, and in the meanwhile Monique Wiggins has agreed to work on diet, exercise and weight loss to help with Monique. Proper sleep hygiene was discussed including the need for 7-8 hours of quality sleep each night. A sleep study was not ordered based on symptoms and Epworth score.  Monique on exertion Monique Wiggins's shortness of Wiggins appears to be obesity related and exercise induced. She has agreed to work on weight loss and gradually increase exercise to treat her exercise induced shortness of Wiggins. If Monique Wiggins follows our instructions and loses weight without improvement  of her shortness of Wiggins, we will plan to refer to pulmonology. We will monitor this condition regularly. Monique Wiggins agrees to this plan.  Hypertension We discussed sodium restriction, working on healthy weight loss, and a regular exercise program as the means to achieve improved blood pressure control. Olyvia agreed with this plan and agreed to follow up as directed. We will continue to monitor her blood pressure as well as her progress with the above lifestyle modifications. She will continue her medications as prescribed and will watch for signs of hypotension as she continues her lifestyle modifications.  Diabetes II Yareth has been given extensive diabetes education by myself today including ideal fasting and post-prandial blood glucose readings, individual ideal Hgb A1c goals and hypoglycemia prevention. We discussed the importance of good blood sugar control to decrease the likelihood of diabetic complications such as nephropathy, neuropathy, limb loss, blindness, coronary artery disease, and death. We discussed the importance of intensive lifestyle modification including diet, exercise and weight loss as the first line treatment for diabetes. Shenell agrees to continue her diabetes medications and work on decreasing simple carbohydrates in her diet. She will follow up at the agreed upon time.  Migraines Yarely will continue with her PCP. We discussed that migraines may decrease with weight loss.  Vitamin D Deficiency Kelce was informed that low vitamin D levels contributes to Monique and are associated with obesity, breast, and colon cancer.  She will continue to take vitamin D and will follow up for routine testing of vitamin D, at least 2-3 times per year. She was informed of the risk of over-replacement of vitamin D and agrees to not increase her dose unless she discusses this with [name] first. We will check vitamin D level and Danely will follow up as directed.  At risk for osteopenia and  osteoporosis Maryem was given extended  (15 minutes) osteoporosis prevention counseling today. Malene is at risk for osteopenia and osteoporosis due to her vitamin D deficiency. She was encouraged to take her vitamin D and follow her higher calcium diet and increase strengthening exercise to help strengthen her bones and decrease her risk of osteopenia and osteoporosis.  Depression with Emotional Eating Behaviors We discussed behavior modification techniques today to help Marycruz deal with her emotional eating and depression. We will refer patient to Dr. Mallie Mussel, our bariatric psychologist.  Depression Screen Tela had a moderately positive depression screening. Depression is commonly associated with obesity and often results in emotional eating behaviors. We will monitor this closely and work on CBT to help improve the non-hunger eating patterns.   Obesity Janyra is currently in the action stage of change and her goal is to continue with weight loss efforts. I recommend Tylia begin the structured treatment plan as follows:  She has agreed to follow the category [place] has been instructed to eventually work up to a goal of 150 minutes of combined cardio and strengthening exercise per week for weight loss and overall health benefits. We discussed the following Behavioral Modification Strategies today: increase H2O intake, no skipping meals, keeping healthy foods in the home, increasing lean protein intake, decreasing simple carbohydrates, increasing vegetables, decrease eating out and work on meal planning and easy cooking plans Trayce will decrease her carbohydrates and she will weigh her meats.   She was informed of the importance of frequent follow up visits to maximize her success with intensive lifestyle modifications for her multiple health conditions. She was informed we would discuss her lab results at her next visit unless there is a critical issue that needs to be  addressed sooner. Maizey agreed to keep her next visit at the agreed upon time to discuss these results.  ALLERGIES: Allergies  Allergen Reactions  . Pollen Extract Swelling  . Shellfish Allergy Swelling    MEDICATIONS: Current Outpatient Medications on File Prior to Visit  Medication Sig Dispense Refill  . albuterol (VENTOLIN HFA) 108 (90 Base) MCG/ACT inhaler Inhale 2 puffs into the lungs every 6 (six) hours as needed for wheezing or shortness of Wiggins. 1 Inhaler 2  . amLODipine (NORVASC) 5 MG tablet Take 1 tablet (5 mg total) by mouth daily. 90 tablet 1  . atorvastatin (LIPITOR) 10 MG tablet Take 1 tablet (10 mg total) by mouth daily. 90 tablet 1  . carvedilol (COREG) 25 MG tablet Take 1 tablet (25 mg total) by mouth 2 (two) times daily with a meal. 180 tablet 1  . cephALEXin (KEFLEX) 500 MG capsule Take 1 capsule (500 mg total) by mouth 2 (two) times daily. 14 capsule 0  . cetirizine (ZYRTEC) 10 MG tablet Take 1 tablet (10 mg total) by mouth daily. 30 tablet 11  . Cholecalciferol (VITAMIN D3) 125 MCG (5000 UT) CAPS Take by mouth.    . Continuous Blood Gluc Sensor (FREESTYLE LIBRE 14 DAY SENSOR) MISC 1 each by Does not apply route every 14 (fourteen) days. 2 each 0  . fluticasone (  FLONASE) 50 MCG/ACT nasal spray Place 2 sprays into both nostrils daily. 16 g 1  . hydrochlorothiazide (HYDRODIURIL) 25 MG tablet Take 25 mg by mouth daily.    Marland Kitchen ibuprofen (ADVIL,MOTRIN) 200 MG tablet Take 800 mg by mouth every 6 (six) hours as needed for mild pain.    Marland Kitchen losartan-hydrochlorothiazide (HYZAAR) 100-25 MG tablet Take 1 tablet by mouth daily. 90 tablet 1  . meloxicam (MOBIC) 15 MG tablet Take 1 tablet (15 mg total) by mouth daily as needed for pain. 21 tablet 0  . metFORMIN (GLUCOPHAGE) 500 MG tablet Take 1 tablet (500 mg total) by mouth daily with breakfast. 90 tablet 1  . mometasone (ELOCON) 0.1 % cream Apply 1 application topically daily. 45 g 2  . neomycin-polymyxin-hydrocortisone  (CORTISPORIN) OTIC solution Apply 1-2 drops to toes twice a day until all gone 10 mL 0  . omeprazole (PRILOSEC) 40 MG capsule Take 1 capsule (40 mg total) by mouth daily. 30 capsule 1  . potassium chloride SA (K-DUR,KLOR-CON) 20 MEQ tablet TAKE 1 TABLET (20 MEQ TOTAL) BY MOUTH DAILY. 30 tablet 5  . sertraline (ZOLOFT) 50 MG tablet Take 1 tablet (50 mg total) by mouth daily. 90 tablet 1  . topiramate (TOPAMAX) 50 MG tablet Take 1 tablet (50 mg total) by mouth daily. 30 tablet 5  . tretinoin (RETIN-A) 0.025 % cream APPLY TO THE AFFECTED AREA(S) AT BEDTIME 45 g 2   No current facility-administered medications on file prior to visit.     PAST MEDICAL HISTORY: Past Medical History:  Diagnosis Date  . Allergy   . Anemia   . Arthritis   . Back pain   . Carpal tunnel syndrome   . Diabetes mellitus without complication ([place])   . DVT (deep venous thrombosis) ([place])    after long car trip  . H. pylori infection    1997  . Heart murmur   . Hypertension   . Hypertriglyceridemia 02/22/2017  . Hypertriglyceridemia 02/22/2017  . Migraines   . Sickle cell trait ([place])     PAST SURGICAL HISTORY: Past Surgical History:  Procedure Laterality Date  . ABDOMINAL HYSTERECTOMY     ovaries left in place  . TOE SURGERY     left great toe, repair of severed tendon  . TONSILLECTOMY    . tummy tuck      SOCIAL HISTORY: Social History   Tobacco Use  . Smoking status: Never Smoker  . Smokeless tobacco: Never Used  Substance Use Topics  . Alcohol use: No    Comment: never  . Drug use: No    FAMILY HISTORY: Family History  Problem Relation Age of Onset  . Diabetes type II Mother   . Hypertension Mother   . Diabetes Mother   . Diabetes Mellitus II Father   . Pulmonary fibrosis Father   . Diabetes Father   . Hypertension Father   . Pulmonary disease Father   . High Cholesterol Father   . Hypertension Sister   . Diabetes Sister   . Cancer Maternal Grandmother        colon  . Allergic  Disorder Son   . Cancer Maternal Grandfather        lung, smoker  . Allergic Disorder Son   . Asthma Son   . Colon cancer Neg Hx   . Esophageal cancer Neg Hx   . Pancreatic cancer Neg Hx   . Stomach cancer Neg Hx     ROS: Review of Systems  Constitutional:  Positive for malaise/Monique.  Eyes:       + Vision Changes + Wear Glasses or Contacts + Blurry or Double Vision + Flashes of Light + Floaters  Respiratory: Positive for shortness of Wiggins (on exertion).   Cardiovascular: Negative for chest pain and orthopnea.  Gastrointestinal: Negative for nausea and vomiting.       + Swallowing Difficulty  Genitourinary: Positive for frequency.  Musculoskeletal: Positive for back pain.       Negative for muscle weakness  Skin: Positive for itching.       + Dryness  Neurological: Positive for headaches.  Endo/Heme/Allergies:       + Heat or Cold Intolerance Negative for hypoglycemia  Psychiatric/Behavioral: Positive for depression. Negative for suicidal ideas. The patient has insomnia.     PHYSICAL EXAM: Blood pressure 128/82, pulse (!) 56, temperature 98.3 F (36.8 C), temperature source Oral, height 5\' 2"  (1.575 m), weight 193 lb (87.5 kg), SpO2 96 %. Body mass index is 35.3 kg/m. Physical Exam Vitals signs reviewed.  Constitutional:      Appearance: Normal appearance. She is well-developed. She is obese.  HENT:     Head: Normocephalic and atraumatic.     Nose: Nose normal.  Eyes:     General: No scleral icterus.    Extraocular Movements: Extraocular movements intact.     Right eye: Normal extraocular motion.     Left eye: Normal extraocular motion.  Neck:     Musculoskeletal: Normal range of motion and neck supple.     Thyroid: No thyromegaly.  Cardiovascular:     Rate and Rhythm: Normal rate and regular rhythm.  Pulmonary:     Effort: Pulmonary effort is normal. No respiratory distress.  Abdominal:     Palpations: Abdomen is soft.     Tenderness: There is no  abdominal tenderness.  Musculoskeletal: Normal range of motion.     Comments: Range of Motion normal in all 4 extremities  Skin:    General: Skin is warm and dry.  Neurological:     Mental Status: She is alert and oriented to person, place, and time.     Coordination: Coordination normal.  Psychiatric:        Mood and Affect: Mood normal.        Behavior: Behavior normal.        Thought Content: Thought content does not include homicidal or suicidal ideation.     RECENT LABS AND TESTS: BMET    Component Value Date/Time   NA 137 12/01/2018 0000   K 3.5 12/01/2018 0000   CL 101 12/01/2018 0000   CO2 21 12/01/2018 0000   GLUCOSE 157 (H) 12/01/2018 0000   GLUCOSE 192 (H) 06/30/2018 1008   BUN 17 12/01/2018 0000   CREATININE 0.89 12/01/2018 0000   CALCIUM 9.8 12/01/2018 0000   GFRNONAA 74 12/01/2018 0000   GFRAA 85 12/01/2018 0000   Lab Results  Component Value Date   HGBA1C 7.7 (H) 12/01/2018   Lab Results  Component Value Date   INSULIN 39.9 (H) 12/01/2018   CBC    Component Value Date/Time   WBC 7.8 06/30/2018 1008   RBC 5.31 (H) 06/30/2018 1008   HGB 14.5 06/30/2018 1008   HCT 43.7 06/30/2018 1008   PLT 374.0 06/30/2018 1008   MCV 82.3 06/30/2018 1008   MCH 27.0 07/22/2016 0937   MCHC 33.1 06/30/2018 1008   RDW 13.9 06/30/2018 1008   LYMPHSABS 2.3 11/28/2017 1751   MONOABS 0.6 11/28/2017 1751  EOSABS 0.3 11/28/2017 1751   BASOSABS 0.1 11/28/2017 1751   Iron/TIBC/Ferritin/ %Sat No results found for: IRON, TIBC, FERRITIN, IRONPCTSAT Lipid Panel     Component Value Date/Time   CHOL 123 12/01/2018 0000   TRIG 197 (H) 12/01/2018 0000   HDL 59 12/01/2018 0000   CHOLHDL 4 06/30/2018 1008   VLDL 52.2 (H) 06/30/2018 1008   LDLCALC 25 12/01/2018 0000   LDLDIRECT 36.0 06/30/2018 1008   Hepatic Function Panel     Component Value Date/Time   PROT 7.2 12/01/2018 0000   ALBUMIN 4.6 12/01/2018 0000   AST 18 12/01/2018 0000   ALT 45 (H) 12/01/2018 0000    ALKPHOS 99 12/01/2018 0000   BILITOT 0.4 12/01/2018 0000      Component Value Date/Time   TSH 1.53 06/30/2018 1008   TSH 1.88 03/03/2018 1735   TSH 1.89 11/28/2017 1751    ECG  shows NSR with a rate of 59 BPM INDIRECT CALORIMETER done today shows a VO2 of 245 and a REE of 1709.  Her calculated basal metabolic rate is 2130 thus her basal metabolic rate is better than expected.       OBESITY BEHAVIORAL INTERVENTION VISIT  Today's visit was # 1   Starting weight: 193 lbs Starting date: 12/01/2018 Today's weight : 193 lbs Today's date: 12/01/2018 Total lbs lost to date: 0    12/01/2018  Height 5\' 2"  (1.575 m)  Weight 193 lb (87.5 kg)  BMI (Calculated) 35.29  BLOOD PRESSURE - SYSTOLIC 865  BLOOD PRESSURE - DIASTOLIC 82  Waist Measurement  44 inches   Body Fat % 41.7 %  Total Body Water (lbs) 74.6 lbs  RMR 1709    ASK: We discussed the diagnosis of obesity with Manuella Ghazi today and Sierria agreed to give [name] permission to discuss obesity behavioral modification therapy today.  ASSESS: Laree has the diagnosis of obesity and her BMI today is 35.29 Sapphira is in the action stage of change   ADVISE: Wahneta was educated on the multiple health risks of obesity as well as the benefit of weight loss to improve her health. She was advised of the need for long term treatment and the importance of lifestyle modifications to improve her current health and to decrease her risk of future health problems.  AGREE: Multiple dietary modification options and treatment options were discussed and  Bettyjo agreed to follow the recommendations documented in the above note.  ARRANGE: Ajeenah was educated on the importance of frequent visits to treat obesity as outlined per CMS and USPSTF guidelines and agreed to schedule her next follow up appointment today.  Corey Skains, am acting as Location manager for [employer]. Owens Shark, DO   I have reviewed the above documentation for  accuracy and completeness, and I agree with the above. -Jearld Lesch, DO

## 2018-12-04 ENCOUNTER — Other Ambulatory Visit: Payer: Self-pay

## 2018-12-04 ENCOUNTER — Telehealth (INDEPENDENT_AMBULATORY_CARE_PROVIDER_SITE_OTHER): Payer: Self-pay | Admitting: Psychology

## 2018-12-04 ENCOUNTER — Ambulatory Visit (INDEPENDENT_AMBULATORY_CARE_PROVIDER_SITE_OTHER): Payer: 59 | Admitting: Psychology

## 2018-12-04 ENCOUNTER — Encounter (INDEPENDENT_AMBULATORY_CARE_PROVIDER_SITE_OTHER): Payer: Self-pay | Admitting: Bariatrics

## 2018-12-04 NOTE — Telephone Encounter (Signed)
  Office: [phone]  /  Fax: [phone]  Date of Call: December 04, 2018  Time of Call: 3:01pm Provider: Glennie Isle, PsyD  CONTENT: This provider called [place] to assist with connecting with today's [facility] appointment as today was the first appointment. A female answered the phone and indicated that Brytani is "not in right now." He asked this provider if she wanted to leave a message; however, a message was not left to avoid breaking confidentiality. Of note, this provider stayed on the [facility] appointment for 10 minutes prior to signing off.   PLAN: This provider will wait for Esty to call back. If deemed necessary, this provider or the provider's clinic will call Asma again in approximately one week.

## 2018-12-04 NOTE — Progress Notes (Unsigned)
Office: [phone]  /  Fax: [phone]    Date: December 04, 2018    Appointment Start Time:*** Duration:*** Provider: Glennie Isle, Psy.D. Type of Session: Intake for Individual Therapy  Location of Patient: *** Location of Provider: Provider's Home Type of Contact: Telepsychological Visit via [employer] Web[employer]  Informed Consent: Prior to proceeding with today's appointment, two pieces of identifying information were obtained from Monique Wiggins to verify identity. In addition, Monique Wiggins's physical location at the time of this appointment was obtained. Monique Wiggins reported she was at *** and provided the address. In the event of technical difficulties, Monique Wiggins shared a phone number she could be reached at. Monique Wiggins and this provider participated in today's telepsychological service. Also, Monique Wiggins denied anyone else being present in the room or on the WebEx appointment***.   The provider's role was explained to Monique Wiggins. The provider reviewed and discussed issues of confidentiality, privacy, and limits therein (e.g., reporting obligations). In addition to verbal informed consent, written informed consent for psychological services was obtained from Monique Wiggins prior to the initial intake interview. Written consent included information concerning the practice, financial arrangements, and confidentiality and patients' rights. Since the clinic is not a 24/7 crisis center, mental health emergency resources were shared, and the provider explained MyChart, e-mail, voicemail, and/or other messaging systems should be utilized only for non-emergency reasons. This provider also explained that information obtained during appointments will be placed in Monique Wiggins's medical record in a confidential manner and relevant information will be shared with other providers at [facility] that she meets with for coordination of care. Monique Wiggins verbally acknowledged understanding of the aforementioned, and agreed to use  mental health emergency resources discussed if needed. Moreover, Monique Wiggins agreed information may be shared with other [facility] providers as needed for coordination of care. By signing the service agreement document, Monique Wiggins provided written consent for coordination of care.   Prior to initiating telepsychological services, Monique Wiggins was provided with an informed consent document, which included the development of a safety plan (i.e., an emergency contact and emergency resources) in the event of an emergency/crisis. Monique Wiggins expressed understanding of the rationale of the safety plan and provided consent for this provider to reach out to her emergency contact in the event of an emergency/crisis.Monique Wiggins returned the completed consent form prior to today's appointment. This provider verbally reviewed the consent form during today's appointment prior to proceeding with the appointment. Monique Wiggins verbally acknowledged understanding that she is ultimately responsible for understanding her insurance benefits as it relates to reimbursement of telepsychological services. This provider also reviewed confidentiality, as it relates to telepsychological services, as well as the rationale for telepsychological services. More specifically, this provider's clinic is limiting in-person visits due to COVID-19. Therapeutic services will resume to in-person appointments once deemed appropriate. Monique Wiggins expressed understanding regarding the rationale for telepsychological services. In addition, this provider explained the telepsychological services informed consent document would be considered an addendum to the initial consent document/service agreement. Monique Wiggins verbally consented to proceed.   Chief Complaint/HPI: Monique Wiggins was referred by Dr. Jearld Lesch due to depression with emotional eating behaviors. Per the note for the visit with Dr. Jearld Lesch on December 01, 2018, "Monique Wiggins is struggling with emotional and boredom  eating and using food for comfort to the extent that it is negatively impacting her health. She often snacks when she is not hungry. Monique Wiggins sometimes feels she is out of control and then feels guilty that she made poor food choices. She is attempting to work  on behavior modification techniques to help reduce her emotional eating. She shows no sign of suicidal or homicidal ideations. PHQ-9 Score is 14." During the initial appointment with Dr. Jearld Lesch at [facility] on December 01, 2018, Monique Wiggins reported experiencing the following: frequently making poor food choices, frequently eating larger portions than normal , binge eating behaviors, struggling with emotional eating, having problems with excessive hunger, snacking between meals and sometimes skipping breakfast.   During today's appointment, Monique Wiggins reported ***. Monique Wiggins was verbally administered a questionnaire assessing various behaviors related to emotional eating. Monique Wiggins endorsed the following: {gbmoodandfood:21755}. She shared she craves the following: bread***.   Moreover, Monique Wiggins indicated *** triggers emotional eating, whereas *** makes emotional eating better. Furthermore, Monique Wiggins {gblegal:22371} others problems of concern. ***   Mental Status Examination:  Appearance: {Appearance:22431} Behavior: {Behavior:22445} Mood: {Teletherapy mood:22435} Affect: {Affect:22436} Speech: {Speech:22432} Eye Contact: {Eye Contact:22433} Psychomotor Activity: {Motor Activity:22434} Thought Process: {thought process:22448}  Content/Perceptual Disturbances: {disturbances:22451} Orientation: {Orientation:22437} Cognition/Sensorium: {gbcognition:22449} Insight: {Insight:22446} Judgment: {Insight:22446}  Family & Psychosocial History: Monique Wiggins reported she is ***. She indicated she is currently ***. Additionally, Monique Wiggins shared her highest level of education obtained is ***. Currently, Monique Wiggins's social support system consists of ***.  Moreover, Monique Wiggins stated she resides with ***.   Medical History:  Past Medical History:  Diagnosis Date   Allergy    Anemia    Arthritis    Back pain    Carpal tunnel syndrome    Diabetes mellitus without complication ([place])    DVT (deep venous thrombosis) ([place])    after long car trip   H. pylori infection    1997   Heart murmur    Hypertension    Hypertriglyceridemia 02/22/2017   Hypertriglyceridemia 02/22/2017   Migraines    Sickle cell trait ([place])    Past Surgical History:  Procedure Laterality Date   ABDOMINAL HYSTERECTOMY     ovaries left in place   TOE SURGERY     left great toe, repair of severed tendon   TONSILLECTOMY     tummy tuck     Current Outpatient Medications on File Prior to Visit  Medication Sig Dispense Refill   albuterol (VENTOLIN HFA) 108 (90 Base) MCG/ACT inhaler Inhale 2 puffs into the lungs every 6 (six) hours as needed for wheezing or shortness of breath. 1 Inhaler 2   amLODipine (NORVASC) 5 MG tablet Take 1 tablet (5 mg total) by mouth daily. 90 tablet 1   atorvastatin (LIPITOR) 10 MG tablet Take 1 tablet (10 mg total) by mouth daily. 90 tablet 1   carvedilol (COREG) 25 MG tablet Take 1 tablet (25 mg total) by mouth 2 (two) times daily with a meal. 180 tablet 1   cephALEXin (KEFLEX) 500 MG capsule Take 1 capsule (500 mg total) by mouth 2 (two) times daily. 14 capsule 0   cetirizine (ZYRTEC) 10 MG tablet Take 1 tablet (10 mg total) by mouth daily. 30 tablet 11   Cholecalciferol (VITAMIN D3) 125 MCG (5000 UT) CAPS Take by mouth.     Continuous Blood Gluc Sensor (FREESTYLE LIBRE 14 DAY SENSOR) MISC 1 each by Does not apply route every 14 (fourteen) days. 2 each 0   fluticasone (FLONASE) 50 MCG/ACT nasal spray Place 2 sprays into both nostrils daily. 16 g 1   hydrochlorothiazide (HYDRODIURIL) 25 MG tablet Take 25 mg by mouth daily.     ibuprofen (ADVIL,MOTRIN) 200 MG tablet Take 800 mg by mouth every 6 (six) hours as needed for  mild pain.     losartan-hydrochlorothiazide (HYZAAR) 100-25 MG tablet Take 1 tablet by mouth daily. 90 tablet 1   meloxicam (MOBIC) 15 MG tablet Take 1 tablet (15 mg total) by mouth daily as needed for pain. 21 tablet 0   metFORMIN (GLUCOPHAGE) 500 MG tablet Take 1 tablet (500 mg total) by mouth daily with breakfast. 90 tablet 1   mometasone (ELOCON) 0.1 % cream Apply 1 application topically daily. 45 g 2   neomycin-polymyxin-hydrocortisone (CORTISPORIN) OTIC solution Apply 1-2 drops to toes twice a day until all gone 10 mL 0   omeprazole (PRILOSEC) 40 MG capsule Take 1 capsule (40 mg total) by mouth daily. 30 capsule 1   potassium chloride SA (K-DUR,KLOR-CON) 20 MEQ tablet TAKE 1 TABLET (20 MEQ TOTAL) BY MOUTH DAILY. 30 tablet 5   sertraline (ZOLOFT) 50 MG tablet Take 1 tablet (50 mg total) by mouth daily. 90 tablet 1   topiramate (TOPAMAX) 50 MG tablet Take 1 tablet (50 mg total) by mouth daily. 30 tablet 5   tretinoin (RETIN-A) 0.025 % cream APPLY TO THE AFFECTED AREA(S) AT BEDTIME 45 g 2   No current facility-administered medications on file prior to visit.     Mental Health History: Monique Wiggins {gblegal:22371} a history of mental health treatment, including therapeutic services. Monique Wiggins denied a history of hospitalizations for psychiatric concerns, and has never met with a psychiatrist.*** Regarding psychotropic medications, Monique Wiggins stated ***. Monique Wiggins {gblegal:22371} a family history of mental health related concerns. *** Monique Wiggins denied a trauma history, including {gbtrauma:22071} abuse, as well as neglect. ***  Lethia described her typical mood as ***. Aside from concerns noted above and endorsed on the PHQ-9 and GAD-7, Jaelynne reported ***. Antoninette {gblegal:22371} current alcohol use. *** She {gblegal:22371} tobacco use. *** She {HDQQIWL:79892} illicit/recreational substance use. Regarding caffeine intake, Terena reported ***. Furthermore, Keita denied experiencing the following:  {gbsxs:21965}. She also denied history of and current suicidal ideation, plan, and intent; history of and current homicidal ideation, plan, and intent; and history of and current engagement in self-harm.  The following strengths were reported by Carlyon Shadow:*** The following strengths were observed by this provider: {gbstrengths:22223}.  Legal History: Monique Wiggins {gblegal:22371} a history of legal involvement.   Structured Assessment Results: The Patient Health Questionnaire-9 (PHQ-9) is a self-report measure that assesses symptoms and severity of depression over the course of the last two weeks. Chace obtained a score of *** suggesting {GBPHQ9SEVERITY:21752}. Jameshia finds the endorsed symptoms to be {gbphq9difficulty:21754}. Little interest or pleasure in doing things ***  Feeling down, depressed, or hopeless ***  Trouble falling or staying asleep, or sleeping too much ***  Feeling tired or having little energy ***  Poor appetite or overeating ***  Feeling bad about yourself --- or that you are a failure or have let yourself or your family down ***  Trouble concentrating on things, such as reading the newspaper or watching television ***  Moving or speaking so slowly that other people could have noticed? Or the opposite --- being so fidgety or restless that you have been moving around a lot more than usual ***  Thoughts that you would be better off dead or hurting yourself in some way ***  PHQ-9 Score ***    The Generalized Anxiety Disorder-7 (GAD-7) is a brief self-report measure that assesses symptoms of anxiety over the course of the last two weeks. Leanora obtained a score of *** suggesting {gbgad7severity:21753}. Chastity finds the endorsed symptoms to be {gbphq9difficulty:21754}. Feeling nervous, anxious, on edge ***  Not being able to stop or control worrying ***  Worrying too much about different things ***  Trouble relaxing ***  Being so restless that it's hard to sit still ***  Becoming  easily annoyed or irritable ***  Feeling afraid as if something awful might happen ***  GAD-7 Score ***   Interventions: A chart review was conducted prior to the clinical intake interview. The PHQ-9, and GAD-7 were verbally *** administered as well as a Mood and Food questionnaire to assess various behaviors related to emotional eating. Throughout session, empathic reflections and validation was provided. Continuing treatment with this provider was discussed and a treatment goal was established. Psychoeducation regarding emotional versus physical hunger was provided. Arlone was sent a handout via a MyChart message*** to utilize between now and the next appointment to increase awareness of hunger patterns and subsequent eating. Prior to sending the [place] message, this provider explained the message would be visible to all providers, as it would be part of the electronic medical record. Kechia verbally acknowledged understanding, and verbally consented to this provider sending the MyChart message***.  Provisional DSM-5 Diagnosis: ***  Plan: Abrar appears able and willing to participate as evidenced by collaboration on a treatment goal, engagement in reciprocal conversation, and asking questions as needed for clarification. The next appointment will be scheduled in {gbweeks:21758}, which will be via [employer]. The following treatment goal was established: {gbtxgoals:21759}. Once this provider's office resumes in-person appointments and it is deemed appropriate, Kieren will be notified. For the aforementioned goal, Keiana can benefit from biweekly individual therapy sessions that are brief in duration for approximately four to six sessions. The treatment modality will be individual therapeutic services, including an eclectic therapeutic approach utilizing techniques from Cognitive Behavioral Therapy, Patient Centered Therapy, Dialectical Behavior Therapy, Acceptance and Commitment Therapy,  Interpersonal Therapy, and Cognitive Restructuring. Therapeutic approach will include various interventions as appropriate, such as validation, support, mindfulness, thought defusion, reframing, psychoeducation, values assessment, and role playing. This provider will regularly review the treatment plan and medical chart to keep informed of status changes. Patirica expressed understanding and agreement with the initial treatment plan of care.
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
## 2018-12-09 MED FILL — POTASSIUM CHLORIDE CRYS ER: 20 | 30 days supply | Qty: 30 | Fill #3

## 2018-12-09 MED FILL — MELOXICAM 15 MG TABLET: 15 | 30 days supply | Qty: 30 | Fill #0

## 2018-12-09 MED FILL — ATORVASTATIN 10 MG TABLET: 10 | 90 days supply | Qty: 90 | Fill #0

## 2018-12-10 ENCOUNTER — Ambulatory Visit (INDEPENDENT_AMBULATORY_CARE_PROVIDER_SITE_OTHER): Payer: 59 | Admitting: Psychology

## 2018-12-10 ENCOUNTER — Other Ambulatory Visit: Payer: Self-pay

## 2018-12-10 DIAGNOSIS — F3289 Other specified depressive episodes: Secondary | ICD-10-CM

## 2018-12-15 ENCOUNTER — Ambulatory Visit (INDEPENDENT_AMBULATORY_CARE_PROVIDER_SITE_OTHER): Payer: 59 | Admitting: Bariatrics

## 2018-12-15 ENCOUNTER — Other Ambulatory Visit: Payer: Self-pay

## 2018-12-15 VITALS — BP 125/85 | HR 69 | Temp 98.3°F | Ht 62.0 in | Wt 192.0 lb

## 2018-12-15 DIAGNOSIS — E119 Type 2 diabetes mellitus without complications: Secondary | ICD-10-CM | POA: Diagnosis not present

## 2018-12-15 DIAGNOSIS — E559 Vitamin D deficiency, unspecified: Secondary | ICD-10-CM | POA: Diagnosis not present

## 2018-12-15 DIAGNOSIS — Z6835 Body mass index (BMI) 35.0-35.9, adult: Secondary | ICD-10-CM

## 2018-12-15 DIAGNOSIS — Z9189 Other specified personal risk factors, not elsewhere classified: Secondary | ICD-10-CM

## 2018-12-15 MED ORDER — METFORMIN HCL 500 MG PO TABS: 500.0000 mg | ORAL_TABLET | Freq: Two times a day (BID) | ORAL | 0 refills | Status: DC

## 2018-12-15 MED FILL — HYDROCHLOROTHIAZIDE 25 MG T: 25 | 30 days supply | Qty: 30 | Fill #2

## 2018-12-17 NOTE — Progress Notes (Signed)
Office: [phone]  /  Fax: [phone]   HPI:   Chief Complaint: OBESITY Monique Wiggins is here to discuss her progress with her obesity treatment plan. She is on the Category 3 plan and is following her eating plan approximately 70 % of the time. She states she is exercising 0 minutes 0 times per week. Monique Wiggins is down 1 pound. She states that she does well with breakfast and dinner, but she struggles at lunch. She felt more control, but occasional hunger, if she was not eating enough protein. Her weight is 192 lb (87.1 kg) today and has had a weight loss of 1 pound over a period of 2 weeks since her last visit. She has lost 1 lb since starting treatment with [name].  Vitamin D deficiency Monique Wiggins has a diagnosis of vitamin D deficiency. Her last vitamin D level was at 105.0 and has now resolved. She is on prescription vit D by another provider. Monique Wiggins denies nausea, vomiting or muscle weakness.  Diabetes II Monique Wiggins has a diagnosis of diabetes type II. She is taking metformin. Last A1c was at 7.7 and last insulin level was at 39.9 She has been working on intensive lifestyle modifications including diet, exercise, and weight loss to help control her blood glucose levels.  At risk for cardiovascular disease Monique Wiggins is at a higher than average risk for cardiovascular disease due to obesity and diabetes. She currently denies any chest pain.  ASSESSMENT AND PLAN:  Vitamin D deficiency  Type 2 diabetes mellitus without complication, without long-term current use of insulin ([place]) - Plan: metFORMIN (GLUCOPHAGE) 500 MG tablet  At risk for heart disease  Class 2 severe obesity with serious comorbidity and body mass index (BMI) of 35.0 to 35.9 in adult, unspecified obesity type ([place])  PLAN:  Vitamin D Deficiency Monique Wiggins was informed that low vitamin D levels contributes to fatigue and are associated with obesity, breast, and colon cancer. [name] will stop prescription Vit D (notified) and she will  not take OTC supplements. We will recheck vitamin D level in 3 to 4 months and she will follow up for routine testing of vitamin D, at least 2-3 times per year. She was informed of the risk of over-replacement of vitamin D and agrees to not increase her dose unless she discusses this with [name] first.  Diabetes II Monique Wiggins has been given extensive diabetes education by myself today including ideal fasting and post-prandial blood glucose readings, individual ideal Hgb A1c goals and hypoglycemia prevention. We discussed the importance of good blood sugar control to decrease the likelihood of diabetic complications such as nephropathy, neuropathy, limb loss, blindness, coronary artery disease, and death. We discussed the importance of intensive lifestyle modification including diet, exercise and weight loss as the first line treatment for diabetes. Monique Wiggins agrees to continue metformin 500 mg and change to taking two times daily from once daily #60 with no refills and follow up at the agreed upon time.  Cardiovascular risk counseling Monique Wiggins was given extended (15 minutes) coronary artery disease prevention counseling today. She is 53 y.o. female and has risk factors for heart disease including obesity and diabetes. We discussed intensive lifestyle modifications today with an emphasis on specific weight loss instructions and strategies. Pt was also informed of the importance of increasing exercise and decreasing saturated fats to help prevent heart disease.  Obesity Monique Wiggins is currently in the action stage of change. As such, her goal is to continue with weight loss efforts She has agreed to follow the Category  3 plan Monique Wiggins has been instructed to work up to a goal of 150 minutes of combined cardio and strengthening exercise per week for weight loss and overall health benefits. We discussed the following Behavioral Modification Strategies today: increase H2O intake, no skipping meals, keeping healthy foods in  the home, increasing lean protein intake, decreasing simple carbohydrates, increasing vegetables, decrease eating out and work on meal planning and easy cooking plans Monique Wiggins will pack her lunch, cheese, nuts, apples and she will increase her water intake.  Monique Wiggins has agreed to follow up with our clinic in 2 weeks. She was informed of the importance of frequent follow up visits to maximize her success with intensive lifestyle modifications for her multiple health conditions.  ALLERGIES: Allergies  Allergen Reactions  . Pollen Extract Swelling  . Shellfish Allergy Swelling    MEDICATIONS: Current Outpatient Medications on File Prior to Visit  Medication Sig Dispense Refill  . albuterol (VENTOLIN HFA) 108 (90 Base) MCG/ACT inhaler Inhale 2 puffs into the lungs every 6 (six) hours as needed for wheezing or shortness of breath. 1 Inhaler 2  . amLODipine (NORVASC) 5 MG tablet Take 1 tablet (5 mg total) by mouth daily. 90 tablet 1  . atorvastatin (LIPITOR) 10 MG tablet Take 1 tablet (10 mg total) by mouth daily. 90 tablet 1  . carvedilol (COREG) 25 MG tablet Take 1 tablet (25 mg total) by mouth 2 (two) times daily with a meal. 180 tablet 1  . cephALEXin (KEFLEX) 500 MG capsule Take 1 capsule (500 mg total) by mouth 2 (two) times daily. 14 capsule 0  . cetirizine (ZYRTEC) 10 MG tablet Take 1 tablet (10 mg total) by mouth daily. 30 tablet 11  . Cholecalciferol (VITAMIN D3) 125 MCG (5000 UT) CAPS Take by mouth.    . Continuous Blood Gluc Sensor (FREESTYLE LIBRE 14 DAY SENSOR) MISC 1 each by Does not apply route every 14 (fourteen) days. 2 each 0  . fluticasone (FLONASE) 50 MCG/ACT nasal spray Place 2 sprays into both nostrils daily. 16 g 1  . hydrochlorothiazide (HYDRODIURIL) 25 MG tablet Take 25 mg by mouth daily.    Marland Kitchen ibuprofen (ADVIL,MOTRIN) 200 MG tablet Take 800 mg by mouth every 6 (six) hours as needed for mild pain.    Marland Kitchen losartan-hydrochlorothiazide (HYZAAR) 100-25 MG tablet Take 1 tablet by  mouth daily. 90 tablet 1  . meloxicam (MOBIC) 15 MG tablet Take 1 tablet (15 mg total) by mouth daily as needed for pain. 21 tablet 0  . mometasone (ELOCON) 0.1 % cream Apply 1 application topically daily. 45 g 2  . neomycin-polymyxin-hydrocortisone (CORTISPORIN) OTIC solution Apply 1-2 drops to toes twice a day until all gone 10 mL 0  . omeprazole (PRILOSEC) 40 MG capsule Take 1 capsule (40 mg total) by mouth daily. 30 capsule 1  . potassium chloride SA (K-DUR,KLOR-CON) 20 MEQ tablet TAKE 1 TABLET (20 MEQ TOTAL) BY MOUTH DAILY. 30 tablet 5  . sertraline (ZOLOFT) 50 MG tablet Take 1 tablet (50 mg total) by mouth daily. 90 tablet 1  . topiramate (TOPAMAX) 50 MG tablet Take 1 tablet (50 mg total) by mouth daily. 30 tablet 5  . tretinoin (RETIN-A) 0.025 % cream APPLY TO THE AFFECTED AREA(S) AT BEDTIME 45 g 2   No current facility-administered medications on file prior to visit.     PAST MEDICAL HISTORY: Past Medical History:  Diagnosis Date  . Allergy   . Anemia   . Arthritis   . Back pain   .  Carpal tunnel syndrome   . Diabetes mellitus without complication ([place])   . DVT (deep venous thrombosis) ([place])    after long car trip  . H. pylori infection    1997  . Heart murmur   . Hypertension   . Hypertriglyceridemia 02/22/2017  . Hypertriglyceridemia 02/22/2017  . Migraines   . Sickle cell trait ([place])     PAST SURGICAL HISTORY: Past Surgical History:  Procedure Laterality Date  . ABDOMINAL HYSTERECTOMY     ovaries left in place  . TOE SURGERY     left great toe, repair of severed tendon  . TONSILLECTOMY    . tummy tuck      SOCIAL HISTORY: Social History   Tobacco Use  . Smoking status: Never Smoker  . Smokeless tobacco: Never Used  Substance Use Topics  . Alcohol use: No    Comment: never  . Drug use: No    FAMILY HISTORY: Family History  Problem Relation Age of Onset  . Diabetes type II Mother   . Hypertension Mother   . Diabetes Mother   . Diabetes Mellitus II  Father   . Pulmonary fibrosis Father   . Diabetes Father   . Hypertension Father   . Pulmonary disease Father   . High Cholesterol Father   . Hypertension Sister   . Diabetes Sister   . Cancer Maternal Grandmother        colon  . Allergic Disorder Son   . Cancer Maternal Grandfather        lung, smoker  . Allergic Disorder Son   . Asthma Son   . Colon cancer Neg Hx   . Esophageal cancer Neg Hx   . Pancreatic cancer Neg Hx   . Stomach cancer Neg Hx     ROS: Review of Systems  Constitutional: Positive for weight loss.  Cardiovascular: Negative for chest pain.  Gastrointestinal: Negative for nausea and vomiting.  Musculoskeletal:       Negative for muscle weakness    PHYSICAL EXAM: Blood pressure 125/85, pulse 69, temperature 98.3 F (36.8 C), temperature source Oral, height 5\' 2"  (1.575 m), weight 192 lb (87.1 kg), SpO2 96 %. Body mass index is 35.12 kg/m. Physical Exam Vitals signs reviewed.  Constitutional:      Appearance: Normal appearance. She is well-developed. She is obese.  Cardiovascular:     Rate and Rhythm: Normal rate.  Pulmonary:     Effort: Pulmonary effort is normal.  Musculoskeletal: Normal range of motion.  Skin:    General: Skin is warm and dry.  Neurological:     Mental Status: She is alert and oriented to person, place, and time.  Psychiatric:        Mood and Affect: Mood normal.        Behavior: Behavior normal.     RECENT LABS AND TESTS: BMET    Component Value Date/Time   NA 137 12/01/2018 0000   K 3.5 12/01/2018 0000   CL 101 12/01/2018 0000   CO2 21 12/01/2018 0000   GLUCOSE 157 (H) 12/01/2018 0000   GLUCOSE 192 (H) 06/30/2018 1008   BUN 17 12/01/2018 0000   CREATININE 0.89 12/01/2018 0000   CALCIUM 9.8 12/01/2018 0000   GFRNONAA 74 12/01/2018 0000   GFRAA 85 12/01/2018 0000   Lab Results  Component Value Date   HGBA1C 7.7 (H) 12/01/2018   HGBA1C 8.0 (H) 06/30/2018   HGBA1C 7.0 (H) 03/03/2018   HGBA1C 7.2 (H) 11/28/2017    Lab  Results  Component Value Date   INSULIN 39.9 (H) 12/01/2018   CBC    Component Value Date/Time   WBC 7.8 06/30/2018 1008   RBC 5.31 (H) 06/30/2018 1008   HGB 14.5 06/30/2018 1008   HCT 43.7 06/30/2018 1008   PLT 374.0 06/30/2018 1008   MCV 82.3 06/30/2018 1008   MCH 27.0 07/22/2016 0937   MCHC 33.1 06/30/2018 1008   RDW 13.9 06/30/2018 1008   LYMPHSABS 2.3 11/28/2017 1751   MONOABS 0.6 11/28/2017 1751   EOSABS 0.3 11/28/2017 1751   BASOSABS 0.1 11/28/2017 1751   Iron/TIBC/Ferritin/ %Sat No results found for: IRON, TIBC, FERRITIN, IRONPCTSAT Lipid Panel     Component Value Date/Time   CHOL 123 12/01/2018 0000   TRIG 197 (H) 12/01/2018 0000   HDL 59 12/01/2018 0000   CHOLHDL 4 06/30/2018 1008   VLDL 52.2 (H) 06/30/2018 1008   LDLCALC 25 12/01/2018 0000   LDLDIRECT 36.0 06/30/2018 1008   Hepatic Function Panel     Component Value Date/Time   PROT 7.2 12/01/2018 0000   ALBUMIN 4.6 12/01/2018 0000   AST 18 12/01/2018 0000   ALT 45 (H) 12/01/2018 0000   ALKPHOS 99 12/01/2018 0000   BILITOT 0.4 12/01/2018 0000      Component Value Date/Time   TSH 1.53 06/30/2018 1008   TSH 1.88 03/03/2018 1735   TSH 1.89 11/28/2017 1751     Ref. Range 12/01/2018 00:00  Vitamin D, 25-Hydroxy Latest Ref Range: 30.0 - 100.0 ng/mL 105.0 (H)    OBESITY BEHAVIORAL INTERVENTION VISIT  Today's visit was # 2   Starting weight: 193 lbs Starting date: 12/01/2018 Today's weight : 192 lbs  Today's date: 12/15/2018 Total lbs lost to date: 1    12/15/2018  Height 5\' 2"  (1.575 m)  Weight 192 lb (87.1 kg)  BMI (Calculated) 35.11  BLOOD PRESSURE - SYSTOLIC 300  BLOOD PRESSURE - DIASTOLIC 85   Body Fat % 76.2 %  Total Body Water (lbs) 75 lbs    ASK: We discussed the diagnosis of obesity with Manuella Ghazi today and Zareen agreed to give [name] permission to discuss obesity behavioral modification therapy today.  ASSESS: Lesle has the diagnosis of obesity and her BMI  today is 35.11 Vadie is in the action stage of change   ADVISE: Jasmaine was educated on the multiple health risks of obesity as well as the benefit of weight loss to improve her health. She was advised of the need for long term treatment and the importance of lifestyle modifications to improve her current health and to decrease her risk of future health problems.  AGREE: Multiple dietary modification options and treatment options were discussed and  Murray agreed to follow the recommendations documented in the above note.  ARRANGE: Lakeia was educated on the importance of frequent visits to treat obesity as outlined per CMS and USPSTF guidelines and agreed to schedule her next follow up appointment today.  Corey Skains, am acting as Location manager for [employer]. Owens Shark, DO  I have reviewed the above documentation for accuracy and completeness, and I agree with the above. -Jearld Lesch, DO

## 2018-12-23 NOTE — Progress Notes (Signed)
Office: [phone]  /  Fax: [phone]    Date: December 29, 2018   Appointment Start Time: 2:33pm Duration: 25 minutes Provider: Glennie Isle, Psy.D. Type of Session: Individual Therapy  Location of Patient: Work Biomedical scientist of Provider: [facility] Type of Contact: Telepsychological Visit via [place] Telephone Call  Session Content: Monique Wiggins is a 53 y.o. female presenting via [place] for a follow-up appointment to address the previously established treatment goal of decreasing emotional eating. Of note, today's appointment was initiated as a Webex appointment with both video and audio capabilities; however, the appointment was switched to a regular audio call around 2:35pm, as there were connection issues. This provider explained a landline and landline is the only secure form of phone call. Monique Wiggins expressed understanding of the aforementioned, but provided verbal consent to proceed with her call being via her cell phone. Today's appointment was a telepsychological visit, as this provider's clinic is seeing a limited number of patients for in-person visits due to COVID-19. Therapeutic services will resume to in-person appointments once deemed appropriate. Monique Wiggins expressed understanding regarding the rationale for telepsychological services, and provided verbal consent for today's appointment. Prior to proceeding with today's appointment, Monique Wiggins's physical location at the time of this appointment was obtained. Monique Wiggins reported she was at work and provided the address. In the event of technical difficulties, Monique Wiggins shared a phone number she could be reached at. Monique Wiggins and this provider participated in today's telepsychological service. Also, Monique Wiggins denied anyone else being present near her or on the WebEx appointment/call.  This provider conducted a brief check-in and verbally administered the PHQ-9 and GAD-7. Monique Wiggins stated difficulty with eating congruent to the  meal plan, as she is having renovations done in her kitchen and her son was diagnosed with COVID-19. She also acknowledged emotional eating episodes. Somer stated the renovations will be ongoing for another couple weeks; therefore, it was recommended she discuss food options with Monique Wiggins during her appointment later this week. Monique Wiggins agreed. Monique Wiggins acknowledged she did not review the handout previously sent outlining physical versus emotional hunger; therefore, it was briefly reviewed. In addition, psychoeducation regarding triggers for emotional eating was provided. Monique Wiggins was provided a handout, and encouraged to utilize the handout between now and the next appointment to increase awareness of triggers and frequency. Monique Wiggins agreed. This provider also discussed behavioral strategies for specific triggers, such as placing the utensil down when conversing to avoid mindless eating. Monique Wiggins provided verbal consent during today's appointment for this provider to send the handout via e-mail. Monique Wiggins was receptive to today's session as evidenced by openness to sharing, responsiveness to feedback, and willingness to identify triggers for emotional eating.  Mental Status Examination:  Appearance: neat Behavior: cooperative Mood: euthymic Affect: mood congruent Speech: normal in rate, volume, and tone Eye Contact: appropriate Psychomotor Activity: appropriate Thought Process: linear, logical, and goal directed  Content/Perceptual Disturbances: denies suicidal and homicidal ideation, plan, and intent and no hallucinations, delusions, bizarre thinking or behavior reported or observed Orientation: time, person, place and purpose of appointment Cognition/Sensorium: memory, attention, language, and fund of knowledge intact  Insight: good Judgment: good  Structured Assessment Results: The Patient Health Questionnaire-9 (PHQ-9) is a self-report measure that assesses symptoms and severity of depression over  the course of the last two weeks. Monique Wiggins obtained a score of 6 suggesting mild depression. Monique Wiggins finds the endorsed symptoms to be not difficult at all. Monique Wiggins things 0  Feeling down, depressed, or  hopeless 0  Trouble falling or staying asleep, or sleeping too much 1  Feeling tired or having Monique energy 3  Poor appetite or overeating 2  Feeling bad about yourself --- or that you are a failure or have let yourself or your family down 0  Trouble concentrating on things, such as reading the newspaper or watching television 0  Moving or speaking so slowly that other people could have noticed? Or the opposite --- being so fidgety or restless that you have been moving around a lot more than usual  0  Thoughts that you would be better off dead or hurting yourself in some way 0  PHQ-9 Score 6    The Generalized Anxiety Disorder-7 (GAD-7) is a brief self-report measure that assesses symptoms of anxiety over the course of the last two weeks. Monique Wiggins obtained a score of 2 suggesting minimal anxiety. Monique Wiggins finds the endorsed symptoms to be not difficult at all. Feeling nervous, anxious, on edge 0  Not being able to stop or control worrying 1  Worrying too much about different things 0  Trouble relaxing 1  Being so restless that it's hard to sit still 0  Becoming easily annoyed or irritable 0  Feeling afraid as if something awful might happen 0  GAD-7 Score 2   Interventions:  Conducted a brief chart review Verbal administration of PHQ-9 and GAD-7 for symptom monitoring Provided empathic reflections and validation Reviewed content from the previous session Psychoeducation provided regarding triggers for emotional eating Focused on rapport building Employed supportive psychotherapy interventions to facilitate reduced distress, and to improve coping skills with identified stressors  DSM-5 Diagnosis: 311 (F32.8) Other Specified Depressive Disorder, Emotional  Eating Behaviors  Treatment Goal & Progress: During the initial appointment with this provider, the following treatment goal was established: decrease emotional eating. Progress is limited, as Monique Wiggins has just begun treatment with this provider; however, she is receptive to the interaction and interventions and rapport is being established.   Plan: Monique Wiggins continues to appear able and willing to participate as evidenced by engagement in reciprocal conversation, and asking questions for clarification as appropriate. The next appointment will be scheduled in two weeks, which will be via [employer]. Once this provider's office resumes in-person appointments and it is deemed appropriate, Monique Wiggins will be notified. The next session will focus on reviewing triggers for emotional eating and the introduction of mindfulness.

## 2018-12-29 ENCOUNTER — Ambulatory Visit (INDEPENDENT_AMBULATORY_CARE_PROVIDER_SITE_OTHER): Payer: 59 | Admitting: Psychology

## 2018-12-29 ENCOUNTER — Other Ambulatory Visit: Payer: Self-pay

## 2018-12-29 DIAGNOSIS — F3289 Other specified depressive episodes: Secondary | ICD-10-CM

## 2018-12-30 ENCOUNTER — Other Ambulatory Visit: Payer: Self-pay | Admitting: Orthopedic Surgery

## 2018-12-30 DIAGNOSIS — G5601 Carpal tunnel syndrome, right upper limb: Secondary | ICD-10-CM | POA: Diagnosis not present

## 2018-12-30 DIAGNOSIS — M1811 Unilateral primary osteoarthritis of first carpometacarpal joint, right hand: Secondary | ICD-10-CM | POA: Diagnosis not present

## 2019-01-01 ENCOUNTER — Encounter (INDEPENDENT_AMBULATORY_CARE_PROVIDER_SITE_OTHER): Payer: Self-pay | Admitting: Bariatrics

## 2019-01-01 ENCOUNTER — Other Ambulatory Visit: Payer: Self-pay

## 2019-01-01 ENCOUNTER — Ambulatory Visit (INDEPENDENT_AMBULATORY_CARE_PROVIDER_SITE_OTHER): Payer: 59 | Admitting: Bariatrics

## 2019-01-01 VITALS — BP 113/72 | HR 71 | Temp 98.2°F | Ht 62.0 in | Wt 192.0 lb

## 2019-01-01 DIAGNOSIS — E119 Type 2 diabetes mellitus without complications: Secondary | ICD-10-CM

## 2019-01-01 DIAGNOSIS — Z9189 Other specified personal risk factors, not elsewhere classified: Secondary | ICD-10-CM | POA: Diagnosis not present

## 2019-01-01 DIAGNOSIS — F3289 Other specified depressive episodes: Secondary | ICD-10-CM | POA: Diagnosis not present

## 2019-01-01 DIAGNOSIS — I10 Essential (primary) hypertension: Secondary | ICD-10-CM | POA: Diagnosis not present

## 2019-01-01 DIAGNOSIS — Z6835 Body mass index (BMI) 35.0-35.9, adult: Secondary | ICD-10-CM | POA: Diagnosis not present

## 2019-01-01 MED ORDER — METFORMIN HCL 500 MG PO TABS: 500.0000 mg | ORAL_TABLET | Freq: Two times a day (BID) | ORAL | 0 refills | Status: DC

## 2019-01-05 ENCOUNTER — Other Ambulatory Visit: Payer: Self-pay | Admitting: Family Medicine

## 2019-01-06 DIAGNOSIS — M47816 Spondylosis without myelopathy or radiculopathy, lumbar region: Secondary | ICD-10-CM | POA: Diagnosis not present

## 2019-01-06 MED FILL — TOPIRAMATE 50 MG TABLET: 50 | 30 days supply | Qty: 30 | Fill #0

## 2019-01-06 NOTE — Progress Notes (Signed)
Office: [phone]  /  Fax: [phone]   HPI:   Chief Complaint: OBESITY Monique Wiggins is here to discuss her progress with her obesity treatment plan. She is on the  follow the Category 3 plan and is following her eating plan approximately 0% of the time. She states she is exercising 0 minutes 0 times per week. Monique Wiggins is down 1 lb. She has been remodeling her kitchen and is eating out more. Her weight is 192 lb (87.1 kg) today and has not lost weight since her last visit. She has lost 1 lb since starting treatment with [name].  Depression with emotional eating behaviors Monique Wiggins is struggling with emotional eating and using food for comfort to the extent that it is negatively impacting her health. She often snacks when she is not hungry. Monique Wiggins sometimes feels she is out of control and then feels guilty that she made poor food choices. She has been working on behavior modification techniques to help reduce her emotional eating and has been somewhat successful. Monique Wiggins is seeing Dr. Mallie Mussel and reports limited stress and emotional eating. She shows no sign of suicidal or homicidal ideations.  Depression screen Monique Wiggins 2/9 12/01/2018 12/05/2017 11/27/2016  Decreased Interest 3 0 0  Down, Depressed, Hopeless 1 0 0  PHQ - 2 Score 4 0 0  Altered sleeping 3 3 -  Tired, decreased energy 3 3 -  Change in appetite 1 3 -  Feeling bad or failure about yourself  1 0 -  Trouble concentrating 2 0 -  Moving slowly or fidgety/restless 0 0 -  Suicidal thoughts 0 0 -  PHQ-9 Score 14 9 -  Difficult doing work/chores Not difficult at all - -   Diabetes II Monique Wiggins has a diagnosis of diabetes type II. Monique Wiggins has not checked her fasting blood sugars or 2 hour postprandials. She reports a decreased feeling of hypoglycemia. Last A1c was 7.7 on 12/01/2018 and her insulin was 39.9. She has been working on intensive lifestyle modifications including diet, exercise, and weight loss to help control her blood glucose levels.   Hypertension Monique Wiggins Monique Wiggins is a 53 y.o. female with hypertension.  Monique Wiggins Monique Wiggins denies chest pain or shortness of breath on exertion. She is working weight loss to help control her blood pressure with the goal of decreasing her risk of heart attack and stroke. Monique Wiggins 'ss blood pressure is well controlled at 113/72.  At risk for cardiovascular disease Monique Wiggins is at a higher than average risk for cardiovascular disease due to obesity. She currently denies any chest pain.  ASSESSMENT AND PLAN:  Type 2 diabetes mellitus without complication, without long-term current use of insulin (Monique Wiggins) - Plan: metFORMIN (GLUCOPHAGE) 500 MG tablet  Other depression  Essential hypertension  At risk for heart disease  Class 2 severe obesity with serious comorbidity and body mass index (BMI) of 35.0 to 35.9 in adult, unspecified obesity type (Monique Wiggins)  PLAN:  Depression with Emotional Eating Behaviors We discussed behavior modification techniques today to help Monique Wiggins deal with her emotional eating and depression. She will continue to see Dr. Mallie Mussel and will follow-up with [name] as directed.  Diabetes II Monique Wiggins has been given extensive diabetes education by myself today including ideal fasting and post-prandial blood glucose readings, individual ideal Hgb A1c goals  and hypoglycemia prevention. We discussed the importance of good blood sugar control to decrease the likelihood of diabetic complications such as nephropathy, neuropathy, limb loss, blindness, coronary artery disease, and death. We discussed the importance of  intensive lifestyle modification including diet, exercise and weight loss as the first line treatment for diabetes. Monique Wiggins agrees to continue her diabetes medications. She was given a prescription for metformin 500 mg BID #60 with 0 refills and agrees to follow-up with our clinic in 2 weeks.   Hypertension We discussed sodium restriction, working on healthy weight loss, and a  regular exercise program as the means to achieve improved blood pressure control. Monique Wiggins agreed with this plan and agreed to follow up as directed. We will continue to monitor her blood pressure as well as her progress with the above lifestyle modifications. She will continue her medications as prescribed and will watch for signs of hypotension as she continues her lifestyle modifications.  Cardiovascular risk counseling Monique Wiggins was given extended (15 minutes) coronary artery disease prevention counseling today. She is 53 y.o. female and has risk factors for heart disease including obesity. We discussed intensive lifestyle modifications today with an emphasis on specific weight loss instructions and strategies. Pt was also informed of the importance of increasing exercise and decreasing saturated fats to help prevent heart disease.  Obesity Monique Wiggins is currently in the action stage of change. As such, her goal is to continue with weight loss efforts. She has agreed to follow the Category 3 plan. Monique Wiggins will work on meal planning, intentional eating, and increasing her water intake. She was given a handout on "[employer]." Monique Wiggins has been instructed to work up to a goal of 150 minutes of combined cardio and strengthening exercise per week for weight loss and overall health benefits. We discussed the following Behavioral Modification Strategies today: increasing lean protein intake, decreasing simple carbohydrates, increasing vegetables, increase H20 intake, decrease eating out, no skipping meals, work on meal planning and easy cooking plans, and keeping healthy foods in the home.  Monique Wiggins has agreed to follow-up with our clinic in 2 weeks. She was informed of the importance of frequent follow-up visits to maximize her success with intensive lifestyle modifications for her multiple health conditions.  ALLERGIES: Allergies  Allergen Reactions  . Pollen Extract Swelling  . Shellfish Allergy  Swelling    MEDICATIONS: Current Outpatient Medications on File Prior to Visit  Medication Sig Dispense Refill  . albuterol (VENTOLIN HFA) 108 (90 Base) MCG/ACT inhaler Inhale 2 puffs into the lungs every 6 (six) hours as needed for wheezing or shortness of breath. 1 Inhaler 2  . amLODipine (NORVASC) 5 MG tablet Take 1 tablet (5 mg total) by mouth daily. 90 tablet 1  . atorvastatin (LIPITOR) 10 MG tablet Take 1 tablet (10 mg total) by mouth daily. 90 tablet 1  . carvedilol (COREG) 25 MG tablet Take 1 tablet (25 mg total) by mouth 2 (two) times daily with a meal. 180 tablet 1  . cephALEXin (KEFLEX) 500 MG capsule Take 1 capsule (500 mg total) by mouth 2 (two) times daily. 14 capsule 0  . cetirizine (ZYRTEC) 10 MG tablet Take 1 tablet (10 mg total) by mouth daily. 30 tablet 11  . Cholecalciferol (VITAMIN D3) 125 MCG (5000 UT) CAPS Take by mouth.    . Continuous Blood Gluc Sensor (FREESTYLE LIBRE 14 DAY SENSOR) MISC 1 each by Does not apply route every 14 (fourteen) days. 2 each 0  . fluticasone (FLONASE) 50 MCG/ACT nasal spray Place 2 sprays into both nostrils daily. 16 g 1  . hydrochlorothiazide (HYDRODIURIL) 25 MG tablet Take 25 mg by mouth daily.    Marland Kitchen ibuprofen (ADVIL,MOTRIN) 200 MG tablet Take 800 mg by  mouth every 6 (six) hours as needed for mild pain.    Marland Kitchen losartan-hydrochlorothiazide (HYZAAR) 100-25 MG tablet Take 1 tablet by mouth daily. 90 tablet 1  . meloxicam (MOBIC) 15 MG tablet Take 1 tablet (15 mg total) by mouth daily as needed for pain. 21 tablet 0  . mometasone (ELOCON) 0.1 % cream Apply 1 application topically daily. 45 g 2  . neomycin-polymyxin-hydrocortisone (CORTISPORIN) OTIC solution Apply 1-2 drops to toes twice a day until all gone 10 mL 0  . omeprazole (PRILOSEC) 40 MG capsule Take 1 capsule (40 mg total) by mouth daily. 30 capsule 1  . potassium chloride SA (K-DUR,KLOR-CON) 20 MEQ tablet TAKE 1 TABLET (20 MEQ TOTAL) BY MOUTH DAILY. 30 tablet 5  . sertraline (ZOLOFT) 50  MG tablet Take 1 tablet (50 mg total) by mouth daily. 90 tablet 1  . topiramate (TOPAMAX) 50 MG tablet Take 1 tablet (50 mg total) by mouth daily. 30 tablet 5  . tretinoin (RETIN-A) 0.025 % cream APPLY TO THE AFFECTED AREA(S) AT BEDTIME 45 g 2   No current facility-administered medications on file prior to visit.     PAST MEDICAL HISTORY: Past Medical History:  Diagnosis Date  . Allergy   . Anemia   . Arthritis   . Back pain   . Carpal tunnel syndrome   . Diabetes mellitus without complication ([place])   . DVT (deep venous thrombosis) ([place])    after long car trip  . H. pylori infection    1997  . Heart murmur   . Hypertension   . Hypertriglyceridemia 02/22/2017  . Hypertriglyceridemia 02/22/2017  . Migraines   . Sickle cell trait ([place])     PAST SURGICAL HISTORY: Past Surgical History:  Procedure Laterality Date  . ABDOMINAL HYSTERECTOMY     ovaries left in place  . TOE SURGERY     left great toe, repair of severed tendon  . TONSILLECTOMY    . tummy tuck      SOCIAL HISTORY: Social History   Tobacco Use  . Smoking status: Never Smoker  . Smokeless tobacco: Never Used  Substance Use Topics  . Alcohol use: No    Comment: never  . Drug use: No    FAMILY HISTORY: Family History  Problem Relation Age of Onset  . Diabetes type II Mother   . Hypertension Mother   . Diabetes Mother   . Diabetes Mellitus II Father   . Pulmonary fibrosis Father   . Diabetes Father   . Hypertension Father   . Pulmonary disease Father   . High Cholesterol Father   . Hypertension Sister   . Diabetes Sister   . Cancer Maternal Grandmother        colon  . Allergic Disorder Son   . Cancer Maternal Grandfather        lung, smoker  . Allergic Disorder Son   . Asthma Son   . Colon cancer Neg Hx   . Esophageal cancer Neg Hx   . Pancreatic cancer Neg Hx   . Stomach cancer Neg Hx    ROS: Review of Systems  Psychiatric/Behavioral: Positive for depression (emotional eating). Negative  for suicidal ideas.       Negative for homicidal ideas.   PHYSICAL EXAM: Blood pressure 113/72, pulse 71, temperature 98.2 F (36.8 C), temperature source Oral, height 5\' 2"  (1.575 m), weight 192 lb (87.1 kg), SpO2 96 %. Body mass index is 35.12 kg/m. Physical Exam Vitals signs reviewed.  Constitutional:  Appearance: Normal appearance. She is obese.  Cardiovascular:     Rate and Rhythm: Normal rate.     Pulses: Normal pulses.  Pulmonary:     Effort: Pulmonary effort is normal.     Breath sounds: Normal breath sounds.  Musculoskeletal: Normal range of motion.  Skin:    General: Skin is warm and dry.  Neurological:     Mental Status: She is alert and oriented to person, place, and time.  Psychiatric:        Behavior: Behavior normal.   RECENT LABS AND TESTS: BMET    Component Value Date/Time   NA 137 12/01/2018 0000   K 3.5 12/01/2018 0000   CL 101 12/01/2018 0000   CO2 21 12/01/2018 0000   GLUCOSE 157 (H) 12/01/2018 0000   GLUCOSE 192 (H) 06/30/2018 1008   BUN 17 12/01/2018 0000   CREATININE 0.89 12/01/2018 0000   CALCIUM 9.8 12/01/2018 0000   GFRNONAA 74 12/01/2018 0000   GFRAA 85 12/01/2018 0000   Lab Results  Component Value Date   HGBA1C 7.7 (H) 12/01/2018   HGBA1C 8.0 (H) 06/30/2018   HGBA1C 7.0 (H) 03/03/2018   HGBA1C 7.2 (H) 11/28/2017   Lab Results  Component Value Date   INSULIN 39.9 (H) 12/01/2018   CBC    Component Value Date/Time   WBC 7.8 06/30/2018 1008   RBC 5.31 (H) 06/30/2018 1008   HGB 14.5 06/30/2018 1008   HCT 43.7 06/30/2018 1008   PLT 374.0 06/30/2018 1008   MCV 82.3 06/30/2018 1008   MCH 27.0 07/22/2016 0937   MCHC 33.1 06/30/2018 1008   RDW 13.9 06/30/2018 1008   LYMPHSABS 2.3 11/28/2017 1751   MONOABS 0.6 11/28/2017 1751   EOSABS 0.3 11/28/2017 1751   BASOSABS 0.1 11/28/2017 1751   Iron/TIBC/Ferritin/ %Sat No results found for: IRON, TIBC, FERRITIN, IRONPCTSAT Lipid Panel     Component Value Date/Time   CHOL 123  12/01/2018 0000   TRIG 197 (H) 12/01/2018 0000   HDL 59 12/01/2018 0000   CHOLHDL 4 06/30/2018 1008   VLDL 52.2 (H) 06/30/2018 1008   LDLCALC 25 12/01/2018 0000   LDLDIRECT 36.0 06/30/2018 1008   Hepatic Function Panel     Component Value Date/Time   PROT 7.2 12/01/2018 0000   ALBUMIN 4.6 12/01/2018 0000   AST 18 12/01/2018 0000   ALT 45 (H) 12/01/2018 0000   ALKPHOS 99 12/01/2018 0000   BILITOT 0.4 12/01/2018 0000      Component Value Date/Time   TSH 1.53 06/30/2018 1008   TSH 1.88 03/03/2018 1735   TSH 1.89 11/28/2017 1751   Results for HENRINE, HAYTER (MRN [id number]) as of 01/06/2019 09:23  Ref. Range 12/01/2018 00:00  Vitamin D, 25-Hydroxy Latest Ref Range: 30.0 - 100.0 ng/mL 105.0 (H)   OBESITY BEHAVIORAL INTERVENTION VISIT  Today's visit was #3  Starting weight: 193 lbs Starting date: 12/01/2018 Today's weight: 192 lbs Today's date: 01/01/2019 Total lbs lost to date: 1   01/01/2019  Height 5\' 2"  (1.575 m)  Weight 192 lb (87.1 kg)  BMI (Calculated) 35.11  BLOOD PRESSURE - SYSTOLIC 382  BLOOD PRESSURE - DIASTOLIC 72   Body Fat % 50.5 %  Total Body Water (lbs) 74.6 lbs   ASK: We discussed the diagnosis of obesity with Manuella Ghazi today and [place] agreed to give [name] permission to discuss obesity behavioral modification therapy today.  ASSESS: Brandey has the diagnosis of obesity and her BMI today is 35.0. Angelli is in  the action stage of change.   ADVISE: Monique Wiggins was educated on the multiple health risks of obesity as well as the benefit of weight loss to improve her health. She was advised of the need for long term treatment and the importance of lifestyle modifications to improve her current health and to decrease her risk of future health problems.  AGREE: Multiple dietary modification options and treatment options were discussed and  Khylie agreed to follow the recommendations documented in the above note.  ARRANGE: Tiena was  educated on the importance of frequent visits to treat obesity as outlined per CMS and USPSTF guidelines and agreed to schedule her next follow up appointment today.  Migdalia Dk, am acting as Location manager for [employer], DO  I have reviewed the above documentation for accuracy and completeness, and I agree with the above. -Jearld Lesch, DO

## 2019-01-08 ENCOUNTER — Other Ambulatory Visit: Payer: Self-pay

## 2019-01-08 ENCOUNTER — Ambulatory Visit: Payer: 59 | Admitting: Family Medicine

## 2019-01-08 ENCOUNTER — Ambulatory Visit (INDEPENDENT_AMBULATORY_CARE_PROVIDER_SITE_OTHER): Payer: 59 | Admitting: Family Medicine

## 2019-01-08 DIAGNOSIS — E669 Obesity, unspecified: Secondary | ICD-10-CM | POA: Diagnosis not present

## 2019-01-08 DIAGNOSIS — E119 Type 2 diabetes mellitus without complications: Secondary | ICD-10-CM | POA: Diagnosis not present

## 2019-01-08 DIAGNOSIS — M549 Dorsalgia, unspecified: Secondary | ICD-10-CM | POA: Diagnosis not present

## 2019-01-08 DIAGNOSIS — M79644 Pain in right finger(s): Secondary | ICD-10-CM

## 2019-01-08 DIAGNOSIS — E785 Hyperlipidemia, unspecified: Secondary | ICD-10-CM | POA: Diagnosis not present

## 2019-01-08 MED ORDER — METFORMIN HCL 500 MG PO TABS: 500.0000 mg | ORAL_TABLET | Freq: Two times a day (BID) | ORAL | 1 refills | Status: DC

## 2019-01-08 MED FILL — metFORMIN HCL 500 MG TABS: 500 | 90 days supply | Qty: 180 | Fill #0

## 2019-01-08 NOTE — Assessment & Plan Note (Signed)
Encouraged heart healthy diet, increase exercise, avoid trans fats, consider a krill oil cap daily 

## 2019-01-08 NOTE — Assessment & Plan Note (Signed)
Following with healthy weight and wellness.

## 2019-01-08 NOTE — Assessment & Plan Note (Signed)
She is scheduled for total joint replacement and CTS on August 3

## 2019-01-08 NOTE — Progress Notes (Signed)
Virtual Visit via Video Note  I connected with Monique Wiggins on 01/08/19 at  1:20 PM EDT by a video enabled telemedicine application and verified that I am speaking with the correct person using two identifiers.  Location: Patient: work Provider: home   I discussed the limitations of evaluation and management by telemedicine and the availability of in person appointments. The patient expressed understanding and agreed to proceed.Magdalene Molly, CMA was able to get the patient set up on visit, video    Subjective:    Patient ID: Monique Wiggins, female    DOB: [birth year], 53 y.o.   MRN: [id number]  No chief complaint on file.   HPI Patient is in today for follow up on chronic medical concerns including hyperlipidemia, obesity, diabetes, back pain and more. No recent febrile illness or hospitalizations. She had a steroid injection in low back pain 2 days ago and she thinks it has been helpful. She is continuing to have right thumb and right wrist pain and is scheduled for correction on August 3. Denies CP/palp/SOB/HA/congestion/fevers/GI or GU c/o. Taking meds as prescribed  Past Medical History:  Diagnosis Date  . Allergy   . Anemia   . Arthritis   . Back pain   . Carpal tunnel syndrome   . Diabetes mellitus without complication ([place])   . DVT (deep venous thrombosis) ([place])    after long car trip  . H. pylori infection    1997  . Heart murmur   . Hypertension   . Hypertriglyceridemia 02/22/2017  . Hypertriglyceridemia 02/22/2017  . Migraines   . Sickle cell trait [facility])     Past Surgical History:  Procedure Laterality Date  . ABDOMINAL HYSTERECTOMY     ovaries left in place  . TOE SURGERY     left great toe, repair of severed tendon  . TONSILLECTOMY    . tummy tuck      Family History  Problem Relation Age of Onset  . Diabetes type II Mother   . Hypertension Mother   . Diabetes Mother   . Diabetes Mellitus II Father   . Pulmonary fibrosis Father    . Diabetes Father   . Hypertension Father   . Pulmonary disease Father   . High Cholesterol Father   . Hypertension Sister   . Diabetes Sister   . Cancer Maternal Grandmother        colon  . Allergic Disorder Son   . Cancer Maternal Grandfather        lung, smoker  . Allergic Disorder Son   . Asthma Son   . Colon cancer Neg Hx   . Esophageal cancer Neg Hx   . Pancreatic cancer Neg Hx   . Stomach cancer Neg Hx     Social History   Socioeconomic History  . Marital status: Married    Spouse name: Trilby Drummer  . Number of children: Not on file  . Years of education: Not on file  . Highest education level: Not on file  Occupational History  . Occupation: Patient [employer]  Social Needs  . Financial resource strain: Not on file  . Food insecurity    Worry: Not on file    Inability: Not on file  . Transportation needs    Medical: Not on file    Non-medical: Not on file  Tobacco Use  . Smoking status: Never Smoker  . Smokeless tobacco: Never Used  Substance and Sexual Activity  . Alcohol use:  No    Comment: never  . Drug use: No  . Sexual activity: Yes    Birth control/protection: Surgical  Lifestyle  . Physical activity    Days per week: Not on file    Minutes per session: Not on file  . Stress: Not on file  Relationships  . Social Herbalist on phone: Not on file    Gets together: Not on file    Attends religious service: Not on file    Active member of club or organization: Not on file    Attends meetings of clubs or organizations: Not on file    Relationship status: Not on file  . Intimate partner violence    Fear of current or ex partner: Not on file    Emotionally abused: Not on file    Physically abused: Not on file    Forced sexual activity: Not on file  Other Topics Concern  . 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 albuterol (VENTOLIN HFA) 108 (90 Base) MCG/ACT inhaler Inhale 2 puffs into the lungs every 6 (six) hours as needed for wheezing or shortness of breath. 1 Inhaler 2  . amLODipine (NORVASC) 5 MG tablet Take 1 tablet (5 mg total) by mouth daily. 90 tablet 1  . atorvastatin (LIPITOR) 10 MG tablet Take 1 tablet (10 mg total) by mouth daily. 90 tablet 1  . carvedilol (COREG) 25 MG tablet Take 1 tablet (25 mg total) by mouth 2 (two) times daily with a meal. 180 tablet 1  . cephALEXin (KEFLEX) 500 MG capsule Take 1 capsule (500 mg total) by mouth 2 (two) times daily. 14 capsule 0  . cetirizine (ZYRTEC) 10 MG tablet Take 1 tablet (10 mg total) by mouth daily. 30 tablet 11  . Cholecalciferol (VITAMIN D3) 125 MCG (5000 UT) CAPS Take by mouth.    . Continuous Blood Gluc Sensor (FREESTYLE LIBRE 14 DAY SENSOR) MISC 1 each by Does not apply route every 14 (fourteen) days. 2 each 0  . fluticasone (FLONASE) 50 MCG/ACT nasal spray Place 2 sprays into both nostrils daily. 16 g 1  . hydrochlorothiazide (HYDRODIURIL) 25 MG tablet Take 25 mg by mouth daily.    Marland Kitchen ibuprofen (ADVIL,MOTRIN) 200 MG tablet Take 800 mg by mouth every 6 (six) hours as needed for mild pain.    Marland Kitchen losartan-hydrochlorothiazide (HYZAAR) 100-25 MG tablet Take 1 tablet by mouth daily. 90 tablet 1  . meloxicam (MOBIC) 15 MG tablet Take 1 tablet (15 mg total) by mouth daily as needed for pain. 21 tablet 0  . mometasone (ELOCON) 0.1 % cream Apply 1 application topically daily. 45 g 2  . neomycin-polymyxin-hydrocortisone (CORTISPORIN) OTIC solution Apply 1-2 drops to toes twice a day until all gone 10 mL 0  . omeprazole  (PRILOSEC) 40 MG capsule Take 1 capsule (40 mg total) by mouth daily. 30 capsule 1  . potassium chloride SA (K-DUR,KLOR-CON) 20 MEQ tablet TAKE 1  TABLET (20 MEQ TOTAL) BY MOUTH DAILY. 30 tablet 5  . sertraline (ZOLOFT) 50 MG tablet Take 1 tablet (50 mg total) by mouth daily. 90 tablet 1  . topiramate (TOPAMAX) 50 MG tablet TAKE 1 TABLET (50 MG TOTAL) BY MOUTH DAILY. 30 tablet 5  . tretinoin (RETIN-A) 0.025 % cream APPLY TO THE AFFECTED AREA(S) AT BEDTIME 45 g 2  . metFORMIN (GLUCOPHAGE) 500 MG tablet Take 1 tablet (500 mg total) by mouth 2 (two) times daily with a meal. 60 tablet 0   No facility-administered medications prior to visit.     Allergies  Allergen Reactions  . Pollen Extract Swelling  . Shellfish Allergy Swelling    Review of Systems  Constitutional: Positive for malaise/fatigue. Negative for fever.  HENT: Negative for congestion.   Eyes: Negative for blurred vision.  Respiratory: Negative for shortness of breath.   Cardiovascular: Negative for chest pain, palpitations and leg swelling.  Gastrointestinal: Negative for abdominal pain, blood in stool and nausea.  Genitourinary: Negative for dysuria and frequency.  Musculoskeletal: Positive for back pain and joint pain. Negative for falls.  Skin: Negative for rash.  Neurological: Negative for dizziness, loss of consciousness and headaches.  Endo/Heme/Allergies: Negative for environmental allergies.  Psychiatric/Behavioral: Negative for depression. The patient is not nervous/anxious.        Objective:    Physical Exam Constitutional:      Appearance: Normal appearance. She is obese. She is not ill-appearing.  HENT:     Head: Normocephalic and atraumatic.     Nose: Nose normal.  Eyes:     General:        Right eye: No discharge.        Left eye: No discharge.  Pulmonary:     Effort: Pulmonary effort is normal.  Neurological:     Mental Status: She is alert and oriented to person, place, and time.  Psychiatric:         Mood and Affect: Mood normal.        Behavior: Behavior normal.     There were no vitals taken for this visit. Wt Readings from Last 3 Encounters:  01/01/19 192 lb (87.1 kg)  12/15/18 192 lb (87.1 kg)  12/01/18 193 lb (87.5 kg)    Diabetic Foot Exam - Simple   No data filed     Lab Results  Component Value Date   WBC 7.8 06/30/2018   HGB 14.5 06/30/2018   HCT 43.7 06/30/2018   PLT 374.0 06/30/2018   GLUCOSE 157 (H) 12/01/2018   CHOL 123 12/01/2018   TRIG 197 (H) 12/01/2018   HDL 59 12/01/2018   LDLDIRECT 36.0 06/30/2018   LDLCALC 25 12/01/2018   ALT 45 (H) 12/01/2018   AST 18 12/01/2018   NA 137 12/01/2018   K 3.5 12/01/2018   CL 101 12/01/2018   CREATININE 0.89 12/01/2018   BUN 17 12/01/2018   CO2 21 12/01/2018   TSH 1.53 06/30/2018   HGBA1C 7.7 (H) 12/01/2018   MICROALBUR 1.4 12/05/2017    Lab Results  Component Value Date   TSH 1.53 06/30/2018   Lab Results  Component Value Date   WBC 7.8 06/30/2018   HGB 14.5 06/30/2018   HCT 43.7 06/30/2018   MCV 82.3 06/30/2018   PLT 374.0 06/30/2018   Lab Results  Component Value Date   NA 137 12/01/2018   K 3.5 12/01/2018   CO2 21 12/01/2018   GLUCOSE 157 (H) 12/01/2018   BUN 17 12/01/2018  CREATININE 0.89 12/01/2018   BILITOT 0.4 12/01/2018   ALKPHOS 99 12/01/2018   AST 18 12/01/2018   ALT 45 (H) 12/01/2018   PROT 7.2 12/01/2018   ALBUMIN 4.6 12/01/2018   CALCIUM 9.8 12/01/2018   ANIONGAP 8 07/22/2016   GFR 77.97 06/30/2018   Lab Results  Component Value Date   CHOL 123 12/01/2018   Lab Results  Component Value Date   HDL 59 12/01/2018   Lab Results  Component Value Date   LDLCALC 25 12/01/2018   Lab Results  Component Value Date   TRIG 197 (H) 12/01/2018   Lab Results  Component Value Date   CHOLHDL 4 06/30/2018   Lab Results  Component Value Date   HGBA1C 7.7 (H) 12/01/2018       Assessment & Plan:   Problem List Items Addressed This Visit    Hyperlipidemia     Encouraged heart healthy diet, increase exercise, avoid trans fats, consider a krill oil cap daily      Diabetes mellitus without complication (HCC)    LHTD4K acceptable, minimize simple carbs. Increase exercise as tolerated. Continue current meds      Relevant Medications   metFORMIN (GLUCOPHAGE) 500 MG tablet   Obesity    Following with healthy weight and wellness.      Relevant Medications   metFORMIN (GLUCOPHAGE) 500 MG tablet   Back pain    Has been following with Dr Mina Marble and 2 days ago she had some spinal injections which she does think she is some better today.      Thumb pain, right    She is scheduled for total joint replacement and CTS on August 3       Other Visit Diagnoses    Type 2 diabetes mellitus without complication, without long-term current use of insulin (HCC)       Relevant Medications   metFORMIN (GLUCOPHAGE) 500 MG tablet      I am having Iyanla C. Culmer maintain her cetirizine, ibuprofen, mometasone, FreeStyle Libre 14 Day Sensor, omeprazole, neomycin-polymyxin-hydrocortisone, tretinoin, potassium chloride SA, fluticasone, carvedilol, atorvastatin, amLODipine, losartan-hydrochlorothiazide, albuterol, sertraline, cephALEXin, meloxicam, Vitamin D3, hydrochlorothiazide, topiramate, and metFORMIN.  Meds ordered this encounter  Medications  . metFORMIN (GLUCOPHAGE) 500 MG tablet    Sig: Take 1 tablet (500 mg total) by mouth 2 (two) times daily with a meal.    Dispense:  180 tablet    Refill:  1    I discussed the assessment and treatment plan with the patient. The patient was provided an opportunity to ask questions and all were answered. The patient agreed with the plan and demonstrated an understanding of the instructions.   The patient was advised to call back or seek an in-person evaluation if the symptoms worsen or if the condition fails to improve as anticipated.  I provided 25 minutes of non-face-to-face time during this encounter.   Penni Homans, MD

## 2019-01-08 NOTE — Assessment & Plan Note (Signed)
Has been following with Dr Mina Marble and 2 days ago she had some spinal injections which she does think she is some better today.

## 2019-01-08 NOTE — Assessment & Plan Note (Signed)
hgba1c acceptable, minimize simple carbs. Increase exercise as tolerated. Continue current meds 

## 2019-01-13 ENCOUNTER — Ambulatory Visit (INDEPENDENT_AMBULATORY_CARE_PROVIDER_SITE_OTHER): Payer: 59 | Admitting: Psychology

## 2019-01-13 ENCOUNTER — Encounter (HOSPITAL_BASED_OUTPATIENT_CLINIC_OR_DEPARTMENT_OTHER): Payer: Self-pay | Admitting: *Deleted

## 2019-01-13 ENCOUNTER — Other Ambulatory Visit: Payer: Self-pay

## 2019-01-13 DIAGNOSIS — F3289 Other specified depressive episodes: Secondary | ICD-10-CM

## 2019-01-13 NOTE — Progress Notes (Signed)
Office: [phone]  /  Fax: [phone]    Date: January 13, 2019    Appointment Start Time: 2:30pm Duration: 29 minutes Provider: Glennie Isle, Psy.D. Type of Session: Individual Therapy  Location of Patient: Work Biomedical scientist of Provider: Provider's Home Type of Contact: Telepsychological Visit via [employer]   Session Content: Monique Wiggins is a 53 y.o. female presenting via [place] for a follow-up appointment to address the previously established treatment goal of decreasing emotional eating. Today's appointment was a telepsychological visit, as this provider's clinic is seeing a limited number of patients for in-person visits due to COVID-19. Therapeutic Wiggins will resume to in-person appointments once deemed appropriate. Naphtali expressed understanding regarding the rationale for telepsychological Wiggins, and provided verbal consent for today's appointment. Prior to proceeding with today's appointment, Monique Wiggins's physical location at the time of this appointment was obtained. Monique Wiggins reported she was at work and provided the address. In the event of technical difficulties, Monique Wiggins shared a phone number she could be reached at. Monique Wiggins and this provider participated in today's telepsychological service. Also, Monique Wiggins denied anyone else being present in the room or on the WebEx appointment. After the initial  4 minutes of today's appointment with audio and video capabilities, there were connections issues due to poor connection on [name]'s end. Thus, it was recommended she call into the [facility] appointment. She agreed.   This provider conducted a brief check-in and verbally administered the PHQ-9 and GAD-7. [name] shared, "Still doing home renovations, but my eating habits are better." She discussed engaging in meal prep. This was positively reinforced. Regarding triggers for emotional eating, [name] acknowledged stress and out of habit (e.g., snacking while watching TV) are triggers. However,  she indicated emotional eating has been "minimum" as she has been busy. Moreover, psychoeducation regarding mindfulness was provided. A handout was provided to [name] with further information regarding mindfulness, including exercises. This provider also explained the benefit of mindfulness as it relates to emotional eating. Monique Wiggins was encouraged to engage in the provided exercises between now and the next appointment with this provider. Monique Wiggins agreed. She was led through an exercise involving her senses during today's appointment. Monique Wiggins provided verbal consent during today's appointment for this provider to send the handout on mindfulness via e-mail. Monique Wiggins was receptive to today's session as evidenced by openness to sharing, responsiveness to feedback, and willingness to engage in mindfulness exercises.  Mental Status Examination: - Based on the initial 4 minutes of the Webex appointment Appearance: neat Behavior: cooperative Mood: euthymic Affect: mood congruent Speech: normal in rate, volume, and tone Eye Contact: appropriate Psychomotor Activity: appropriate Thought Process: linear, logical, and goal directed  Content/Perceptual Disturbances: denies suicidal and homicidal ideation, plan, and intent and no hallucinations, delusions, bizarre thinking or behavior reported or observed Orientation: time, person, place and purpose of appointment Cognition/Sensorium: memory, attention, language, and fund of knowledge intact  Insight: good Judgment: good  Structured Assessment Results: The Patient Health Questionnaire-9 (PHQ-9) is a self-report measure that assesses symptoms and severity of depression over the course of the last two weeks. Monique Wiggins obtained a score of 0. Little interest or pleasure in doing things 0  Feeling down, depressed, or hopeless 0  Trouble falling or staying asleep, or sleeping too much 0  Feeling tired or having little energy 0  Poor appetite or overeating 0   Feeling bad about yourself --- or that you are a failure or have let yourself or your family down 0  Trouble concentrating on things, such as reading  the newspaper or watching television 0  Moving or speaking so slowly that other people could have noticed? Or the opposite --- being so fidgety or restless that you have been moving around a lot more than usual 0  Thoughts that you would be better off dead or hurting yourself in some way 0  PHQ-9 Score 0    The Generalized Anxiety Disorder-7 (GAD-7) is a brief self-report measure that assesses symptoms of anxiety over the course of the last two weeks. Monique Wiggins obtained a score of 0. Feeling nervous, anxious, on edge 0  Not being able to stop or control worrying 0  Worrying too much about different things 0  Trouble relaxing 0  Being so restless that it's hard to sit still 0  Becoming easily annoyed or irritable 0  Feeling afraid as if something awful might happen 0  GAD-7 Score 0   Interventions:  Conducted a brief chart review Verbal administration of PHQ-9 and GAD-7 for symptom monitoring Provided empathic reflections and validation Reviewed content from the previous session Psychoeducation provided regarding mindfulness Engaged patient in a mindfulness exercise Provided positive reinforcement Employed supportive psychotherapy interventions to facilitate reduced distress, and to improve coping skills with identified stressors Employed acceptance and commitment interventions to emphasize mindfulness and acceptance without struggle  DSM-5 Diagnosis: 311 (F32.8) Other Specified Depressive Disorder, Emotional Eating Behaviors  Treatment Goal & Progress: During the initial appointment with this provider, the following treatment goal was established: decrease emotional eating. Monique Wiggins has demonstrated progress in her goal as evidenced by increased awareness of hunger patterns and triggers for emotional eating. She described a reduction in  emotional eating during today's appointment and demonstrated willingness to engage in learned skills.   Plan: Bethan continues to appear able and willing to participate as evidenced by engagement in reciprocal conversation, and asking questions for clarification as appropriate. The next appointment will be scheduled in two weeks, which will be via [employer]. Once this provider's office resumes in-person appointments and it is deemed appropriate, Caleigha will be notified. The next session will focus further on mindfulness.

## 2019-01-15 ENCOUNTER — Other Ambulatory Visit (HOSPITAL_COMMUNITY)
Admission: RE | Admit: 2019-01-15 | Discharge: 2019-01-15 | Disposition: A | Discharge status: Home / Self Care | Payer: 59 | Source: Ambulatory Visit | Attending: Orthopedic Surgery | Admitting: Orthopedic Surgery

## 2019-01-15 ENCOUNTER — Other Ambulatory Visit: Payer: Self-pay

## 2019-01-15 ENCOUNTER — Encounter (HOSPITAL_BASED_OUTPATIENT_CLINIC_OR_DEPARTMENT_OTHER)
Admission: RE | Admit: 2019-01-15 | Discharge: 2019-01-15 | Disposition: A | Discharge status: Home / Self Care | Payer: 59 | Source: Ambulatory Visit | Attending: Orthopedic Surgery | Admitting: Orthopedic Surgery

## 2019-01-15 DIAGNOSIS — Z20828 Contact with and (suspected) exposure to other viral communicable diseases: Secondary | ICD-10-CM | POA: Insufficient documentation

## 2019-01-15 LAB — SARS CORONAVIRUS 2 (TAT 6-24 HRS): SARS Coronavirus 2: NEGATIVE

## 2019-01-15 LAB — BASIC METABOLIC PANEL
Anion gap: 10 (ref 5–15)
BUN: 28 mg/dL — ABNORMAL HIGH (ref 6–20)
CO2: 24 mmol/L (ref 22–32)
Calcium: 9.5 mg/dL (ref 8.9–10.3)
Chloride: 106 mmol/L (ref 98–111)
Creatinine, Ser: 1.12 mg/dL — ABNORMAL HIGH (ref 0.44–1.00)
GFR calc Af Amer: >60 mL/min (ref >60)
GFR calc non Af Amer: 56 mL/min — ABNORMAL LOW (ref >60)
Glucose, Bld: 116 mg/dL — ABNORMAL HIGH (ref 70–99)
Potassium: 4.1 mmol/L (ref 3.5–5.1)
Sodium: 140 mmol/L (ref 135–145)

## 2019-01-17 NOTE — H&P (Signed)
Monique Wiggins is an 53 y.o. female.   Chief Complaint: R hand N/T with thumb pain HPI: Patient with advanced R TMC OA, painful despite nonop measures, and NCS c/w CTS.  Past Medical History:  Diagnosis Date  . Allergy   . Arthritis   . Back pain   . Carpal tunnel syndrome   . Diabetes mellitus without complication ([place])   . DVT (deep venous thrombosis) ([place])    after long car trip  . Dyspnea   . GERD (gastroesophageal reflux disease)   . H. pylori infection    1997  . Heart murmur   . Hypertension   . Hypertriglyceridemia 02/22/2017  . Hypertriglyceridemia 02/22/2017  . Migraines   . Sickle cell trait ([place])   . Sleep apnea    states she no longer has OSA lost weight    Past Surgical History:  Procedure Laterality Date  . ABDOMINAL HYSTERECTOMY     ovaries left in place  . TOE SURGERY     left great toe, repair of severed tendon  . TONSILLECTOMY    . tummy tuck      Family History  Problem Relation Age of Onset  . Diabetes type II Mother   . Hypertension Mother   . Diabetes Mother   . Diabetes Mellitus II Father   . Pulmonary fibrosis Father   . Diabetes Father   . Hypertension Father   . Pulmonary disease Father   . High Cholesterol Father   . Hypertension Sister   . Diabetes Sister   . Cancer Maternal Grandmother        colon  . Allergic Disorder Son   . Cancer Maternal Grandfather        lung, smoker  . Allergic Disorder Son   . Asthma Son   . Colon cancer Neg Hx   . Esophageal cancer Neg Hx   . Pancreatic cancer Neg Hx   . Stomach cancer Neg Hx    Social History:  reports that she has never smoked. She has never used smokeless tobacco. She reports that she does not drink alcohol or use drugs.  Allergies:  Allergies  Allergen Reactions  . Pollen Extract Swelling  . Shellfish Allergy Swelling    No medications prior to admission.    No results found for this or any previous visit (from the past 48 hour(s)). No results found.  Review  of Systems  All other systems reviewed and are negative.   Height 5\' 2"  (1.575 m), weight 87.1 kg. Physical Exam  Constitutional: She is oriented to person, place, and time. She appears well-developed and well-nourished.  HENT:  Head: Normocephalic and atraumatic.  Eyes: Pupils are equal, round, and reactive to light.  Neck: Normal range of motion.  Cardiovascular: Intact distal pulses.  Respiratory: Effort normal and breath sounds normal.  GI: Soft.  Musculoskeletal:     Comments: TTP R TMC, positive TMC stress/grind + phalens, Grossly NVI  Neurological: She is alert and oriented to person, place, and time.  Skin: Skin is warm and dry.  Psychiatric: She has a normal mood and affect. Her behavior is normal. Judgment and thought content normal.     Assessment/Plan R TMC OA and CTS--to OR for thumb suspension arthroplasty and ECTR.  G/R/O reviewed, consent obtained.  Jolyn Nap, MD 01/17/2019, 1:01 PM

## 2019-01-18 NOTE — Anesthesia Preprocedure Evaluation (Addendum)
Anesthesia Evaluation  Patient identified by MRN, date of birth, ID band Patient awake    Reviewed: Allergy & Precautions, H&P , NPO status , Patient's Chart, lab work & pertinent test results  Airway Mallampati: II  TM Distance: >3 FB Neck ROM: Full    Dental no notable dental hx.    Pulmonary neg pulmonary ROS,    Pulmonary exam normal breath sounds clear to auscultation       Cardiovascular Exercise Tolerance: Good hypertension, Pt. on medications negative cardio ROS Normal cardiovascular exam Rhythm:Regular Rate:Normal     Neuro/Psych  Headaches,  Neuromuscular disease negative neurological ROS  negative psych ROS   GI/Hepatic negative GI ROS, Neg liver ROS, GERD  Medicated,  Endo/Other  negative endocrine ROSdiabetes, Type 2Morbid obesity  Renal/GU negative Renal ROS  negative genitourinary   Musculoskeletal negative musculoskeletal ROS (+) Arthritis , Osteoarthritis,    Abdominal   Peds negative pediatric ROS (+)  Hematology negative hematology ROS (+) Blood dyscrasia, Sickle cell trait ,   Anesthesia Other Findings   Reproductive/Obstetrics negative OB ROS                            Anesthesia Physical Anesthesia Plan  ASA: III  Anesthesia Plan: General   Post-op Pain Management: GA combined w/ Regional for post-op pain   Induction:   PONV Risk Score and Plan: 3 and Ondansetron, Treatment may vary due to age or medical condition, Dexamethasone and Midazolam  Airway Management Planned: Oral ETT and LMA  Additional Equipment:   Intra-op Plan:   Post-operative Plan: Extubation in OR  Informed Consent: I have reviewed the patients History and Physical, chart, labs and discussed the procedure including the risks, benefits and alternatives for the proposed anesthesia with the patient or authorized representative who has indicated his/her understanding and acceptance.      Dental advisory given  Plan Discussed with: Anesthesiologist, CRNA and Surgeon  Anesthesia Plan Comments: (Discussed both nerve block for pain relief post-op and GA; including NV, sore throat, dental injury, and pulmonary complications)      Anesthesia Quick Evaluation

## 2019-01-19 ENCOUNTER — Ambulatory Visit (HOSPITAL_BASED_OUTPATIENT_CLINIC_OR_DEPARTMENT_OTHER): Payer: 59 | Admitting: Anesthesiology

## 2019-01-19 ENCOUNTER — Ambulatory Visit: Payer: Self-pay

## 2019-01-19 ENCOUNTER — Encounter (HOSPITAL_BASED_OUTPATIENT_CLINIC_OR_DEPARTMENT_OTHER)
Admission: RE | Disposition: A | Discharge status: Home / Self Care | Payer: Self-pay | Source: Home / Self Care | Attending: Orthopedic Surgery

## 2019-01-19 ENCOUNTER — Other Ambulatory Visit: Payer: Self-pay

## 2019-01-19 ENCOUNTER — Ambulatory Visit (HOSPITAL_BASED_OUTPATIENT_CLINIC_OR_DEPARTMENT_OTHER)
Admission: RE | Admit: 2019-01-19 | Discharge: 2019-01-19 | Disposition: A | Discharge status: Home / Self Care | Payer: 59 | Attending: Orthopedic Surgery | Admitting: Orthopedic Surgery

## 2019-01-19 ENCOUNTER — Encounter (HOSPITAL_BASED_OUTPATIENT_CLINIC_OR_DEPARTMENT_OTHER): Payer: Self-pay

## 2019-01-19 DIAGNOSIS — Z6835 Body mass index (BMI) 35.0-35.9, adult: Secondary | ICD-10-CM | POA: Insufficient documentation

## 2019-01-19 DIAGNOSIS — I10 Essential (primary) hypertension: Secondary | ICD-10-CM | POA: Diagnosis not present

## 2019-01-19 DIAGNOSIS — G5601 Carpal tunnel syndrome, right upper limb: Secondary | ICD-10-CM | POA: Diagnosis not present

## 2019-01-19 DIAGNOSIS — M189 Osteoarthritis of first carpometacarpal joint, unspecified: Secondary | ICD-10-CM | POA: Insufficient documentation

## 2019-01-19 DIAGNOSIS — D573 Sickle-cell trait: Secondary | ICD-10-CM | POA: Insufficient documentation

## 2019-01-19 DIAGNOSIS — G4730 Sleep apnea, unspecified: Secondary | ICD-10-CM | POA: Diagnosis not present

## 2019-01-19 DIAGNOSIS — Z86718 Personal history of other venous thrombosis and embolism: Secondary | ICD-10-CM | POA: Insufficient documentation

## 2019-01-19 DIAGNOSIS — E119 Type 2 diabetes mellitus without complications: Secondary | ICD-10-CM | POA: Insufficient documentation

## 2019-01-19 DIAGNOSIS — G8918 Other acute postprocedural pain: Secondary | ICD-10-CM | POA: Diagnosis not present

## 2019-01-19 DIAGNOSIS — M24841 Other specific joint derangements of right hand, not elsewhere classified: Secondary | ICD-10-CM | POA: Diagnosis not present

## 2019-01-19 DIAGNOSIS — Z833 Family history of diabetes mellitus: Secondary | ICD-10-CM | POA: Insufficient documentation

## 2019-01-19 DIAGNOSIS — M1811 Unilateral primary osteoarthritis of first carpometacarpal joint, right hand: Secondary | ICD-10-CM | POA: Diagnosis not present

## 2019-01-19 HISTORY — DX: Dyspnea, unspecified: R06.00

## 2019-01-19 HISTORY — DX: Sleep apnea, unspecified: G47.30

## 2019-01-19 HISTORY — DX: Gastro-esophageal reflux disease without esophagitis: K21.9

## 2019-01-19 HISTORY — PX: CARPAL TUNNEL RELEASE: SHX101

## 2019-01-19 HISTORY — PX: CARPOMETACARPEL SUSPENSION PLASTY: SHX5005

## 2019-01-19 LAB — GLUCOSE, CAPILLARY
Glucose-Capillary: 141 mg/dL — ABNORMAL HIGH (ref 70–99)
Glucose-Capillary: 144 mg/dL — ABNORMAL HIGH (ref 70–99)

## 2019-01-19 SURGERY — CARPOMETACARPEL (CMC) SUSPENSION PLASTY
Anesthesia: General | Site: Thumb | Laterality: Right

## 2019-01-19 MED ORDER — ONDANSETRON HCL 4 MG/2ML IJ SOLN: 4.0000 mg | Freq: Once | INTRAMUSCULAR | Status: DC | PRN

## 2019-01-19 MED ORDER — MIDAZOLAM HCL 2 MG/2ML IJ SOLN
1.0000 mg | INTRAMUSCULAR | Status: DC | PRN
  Administered 2019-01-19: 2 mg via INTRAVENOUS

## 2019-01-19 MED ORDER — DEXAMETHASONE SODIUM PHOSPHATE 10 MG/ML IJ SOLN
INTRAMUSCULAR | Status: AC
  Filled 2019-01-19: qty 8

## 2019-01-19 MED ORDER — BUPIVACAINE-EPINEPHRINE (PF) 0.5% -1:200000 IJ SOLN
INTRAMUSCULAR | Status: DC | PRN
  Administered 2019-01-19: 20 mL via PERINEURAL

## 2019-01-19 MED ORDER — ACETAMINOPHEN 325 MG PO TABS: 650.0000 mg | ORAL_TABLET | Freq: Four times a day (QID) | ORAL | Status: DC

## 2019-01-19 MED ORDER — LACTATED RINGERS IV SOLN
INTRAVENOUS | Status: DC
  Administered 2019-01-19 (×2): via INTRAVENOUS

## 2019-01-19 MED ORDER — MIDAZOLAM HCL 2 MG/2ML IJ SOLN
INTRAMUSCULAR | Status: AC
  Filled 2019-01-19: qty 2

## 2019-01-19 MED ORDER — ONDANSETRON HCL 4 MG/2ML IJ SOLN
INTRAMUSCULAR | Status: AC
  Filled 2019-01-19: qty 24

## 2019-01-19 MED ORDER — FENTANYL CITRATE (PF) 100 MCG/2ML IJ SOLN
INTRAMUSCULAR | Status: AC
  Filled 2019-01-19: qty 2

## 2019-01-19 MED ORDER — LIDOCAINE HCL 2 % IJ SOLN
INTRAMUSCULAR | Status: AC
  Filled 2019-01-19: qty 20

## 2019-01-19 MED ORDER — BUPIVACAINE-EPINEPHRINE (PF) 0.5% -1:200000 IJ SOLN
INTRAMUSCULAR | Status: AC
  Filled 2019-01-19: qty 30

## 2019-01-19 MED ORDER — CEFAZOLIN SODIUM-DEXTROSE 2-4 GM/100ML-% IV SOLN
INTRAVENOUS | Status: AC
  Filled 2019-01-19: qty 100

## 2019-01-19 MED ORDER — PROPOFOL 500 MG/50ML IV EMUL
INTRAVENOUS | Status: DC | PRN
  Administered 2019-01-19: 25 ug/kg/min via INTRAVENOUS

## 2019-01-19 MED ORDER — SCOPOLAMINE 1 MG/3DAYS TD PT72: 1.0000 | MEDICATED_PATCH | Freq: Once | TRANSDERMAL | Status: DC

## 2019-01-19 MED ORDER — LIDOCAINE HCL (PF) 1 % IJ SOLN
INTRAMUSCULAR | Status: AC
  Filled 2019-01-19: qty 30

## 2019-01-19 MED ORDER — DEXAMETHASONE SODIUM PHOSPHATE 10 MG/ML IJ SOLN
INTRAMUSCULAR | Status: DC | PRN
  Administered 2019-01-19: 4 mg via INTRAVENOUS

## 2019-01-19 MED ORDER — FENTANYL CITRATE (PF) 100 MCG/2ML IJ SOLN: 50.0000 ug | INTRAMUSCULAR | Status: DC | PRN

## 2019-01-19 MED ORDER — LIDOCAINE HCL (CARDIAC) PF 100 MG/5ML IV SOSY
PREFILLED_SYRINGE | INTRAVENOUS | Status: DC | PRN
  Administered 2019-01-19: 30 mg via INTRAVENOUS

## 2019-01-19 MED ORDER — FENTANYL CITRATE (PF) 100 MCG/2ML IJ SOLN: 25.0000 ug | INTRAMUSCULAR | Status: DC | PRN

## 2019-01-19 MED ORDER — CEFAZOLIN SODIUM-DEXTROSE 2-4 GM/100ML-% IV SOLN
2.0000 g | INTRAVENOUS | Status: AC
  Administered 2019-01-19: 08:00:00 2 g via INTRAVENOUS

## 2019-01-19 MED ORDER — OXYCODONE HCL 5 MG/5ML PO SOLN: 5.0000 mg | Freq: Once | ORAL | Status: DC | PRN

## 2019-01-19 MED ORDER — BUPIVACAINE LIPOSOME 1.3 % IJ SUSP
INTRAMUSCULAR | Status: DC | PRN
  Administered 2019-01-19: 10 mL via PERINEURAL

## 2019-01-19 MED ORDER — OXYCODONE HCL 5 MG PO TABS: 5.0000 mg | ORAL_TABLET | Freq: Four times a day (QID) | ORAL | 0 refills | Status: DC | PRN

## 2019-01-19 MED ORDER — OXYCODONE HCL 5 MG PO TABS: 5.0000 mg | ORAL_TABLET | Freq: Once | ORAL | Status: DC | PRN

## 2019-01-19 MED ORDER — FENTANYL CITRATE (PF) 100 MCG/2ML IJ SOLN
50.0000 ug | INTRAMUSCULAR | Status: DC | PRN
  Administered 2019-01-19: 100 ug via INTRAVENOUS

## 2019-01-19 MED ORDER — MEPERIDINE HCL 25 MG/ML IJ SOLN: 6.2500 mg | INTRAMUSCULAR | Status: DC | PRN

## 2019-01-19 MED ORDER — POVIDONE-IODINE 10 % EX SWAB: 2.0000 "application " | Freq: Once | CUTANEOUS | Status: DC

## 2019-01-19 MED ORDER — ACETAMINOPHEN 325 MG PO TABS: 325.0000 mg | ORAL_TABLET | ORAL | Status: DC | PRN

## 2019-01-19 MED ORDER — LIDOCAINE 2% (20 MG/ML) 5 ML SYRINGE
INTRAMUSCULAR | Status: AC
  Filled 2019-01-19: qty 35

## 2019-01-19 MED ORDER — PROPOFOL 10 MG/ML IV BOLUS
INTRAVENOUS | Status: DC | PRN
  Administered 2019-01-19: 100 mg via INTRAVENOUS

## 2019-01-19 MED ORDER — ONDANSETRON HCL 4 MG/2ML IJ SOLN
INTRAMUSCULAR | Status: DC | PRN
  Administered 2019-01-19: 4 mg via INTRAVENOUS

## 2019-01-19 MED ORDER — BUPIVACAINE HCL (PF) 0.5 % IJ SOLN
INTRAMUSCULAR | Status: AC
  Filled 2019-01-19: qty 30

## 2019-01-19 MED ORDER — EPHEDRINE SULFATE 50 MG/ML IJ SOLN
INTRAMUSCULAR | Status: DC | PRN
  Administered 2019-01-19 (×2): 10 mg via INTRAVENOUS

## 2019-01-19 MED ORDER — ACETAMINOPHEN 160 MG/5ML PO SOLN: 325.0000 mg | ORAL | Status: DC | PRN

## 2019-01-19 SURGICAL SUPPLY — 69 items
APPLICATOR COTTON TIP 6 STRL (MISCELLANEOUS) ×2 IMPLANT
APPLICATOR COTTON TIP 6IN STRL (MISCELLANEOUS) ×3
BIT DRILL 11/64XX180123XX4 (BIT) ×2
BIT DRILL 11/64XX180123XX4.3 (BIT) IMPLANT
BLADE AVERAGE 25X9 (BLADE) ×3 IMPLANT
BLADE HOOK ENDO STRL (BLADE) ×3 IMPLANT
BLADE MINI RND TIP GREEN BEAV (BLADE) IMPLANT
BLADE SURG 15 STRL LF DISP TIS (BLADE) ×2 IMPLANT
BLADE SURG 15 STRL SS (BLADE) ×3
BLADE TRIANGLE EPF/EGR ENDO (BLADE) ×3 IMPLANT
BNDG COHESIVE 4X5 TAN STRL (GAUZE/BANDAGES/DRESSINGS) ×3 IMPLANT
BNDG ESMARK 4X9 LF (GAUZE/BANDAGES/DRESSINGS) ×3 IMPLANT
BNDG GAUZE ELAST 4 BULKY (GAUZE/BANDAGES/DRESSINGS) ×3 IMPLANT
CHLORAPREP W/TINT 26 (MISCELLANEOUS) ×3 IMPLANT
CORD BIPOLAR FORCEPS 12FT (ELECTRODE) ×3 IMPLANT
COVER BACK TABLE REUSABLE LG (DRAPES) ×3 IMPLANT
COVER MAYO STAND REUSABLE (DRAPES) ×3 IMPLANT
COVER WAND RF STERILE (DRAPES) IMPLANT
CUFF TOURN SGL QUICK 18X4 (TOURNIQUET CUFF) ×1 IMPLANT
DECANTER SPIKE VIAL GLASS SM (MISCELLANEOUS) IMPLANT
DRAIN TLS ROUND 10FR (DRAIN) IMPLANT
DRAPE EXTREMITY T 121X128X90 (DISPOSABLE) ×3 IMPLANT
DRAPE SURG 17X23 STRL (DRAPES) ×3 IMPLANT
DRILL BIT 1/8DIAX5INL DISPOSE (BIT) ×1 IMPLANT
DRILL BIT 11/64XX180123XX4.3 (BIT) ×1
DRSG EMULSION OIL 3X3 NADH (GAUZE/BANDAGES/DRESSINGS) ×3 IMPLANT
GAUZE SPONGE 4X4 12PLY STRL LF (GAUZE/BANDAGES/DRESSINGS) ×3 IMPLANT
GLOVE BIO SURGEON STRL SZ7 (GLOVE) ×2 IMPLANT
GLOVE BIO SURGEON STRL SZ7.5 (GLOVE) ×3 IMPLANT
GLOVE BIOGEL PI IND STRL 7.0 (GLOVE) ×2 IMPLANT
GLOVE BIOGEL PI IND STRL 7.5 (GLOVE) IMPLANT
GLOVE BIOGEL PI IND STRL 8 (GLOVE) ×2 IMPLANT
GLOVE BIOGEL PI INDICATOR 7.0 (GLOVE) ×1
GLOVE BIOGEL PI INDICATOR 7.5 (GLOVE) ×1
GLOVE BIOGEL PI INDICATOR 8 (GLOVE) ×1
GLOVE ECLIPSE 6.5 STRL STRAW (GLOVE) ×3 IMPLANT
GLOVE EXAM NITRILE MD LF STRL (GLOVE) ×1 IMPLANT
GOWN STRL REUS W/ TWL LRG LVL3 (GOWN DISPOSABLE) ×4 IMPLANT
GOWN STRL REUS W/TWL LRG LVL3 (GOWN DISPOSABLE) ×2
GOWN STRL REUS W/TWL XL LVL3 (GOWN DISPOSABLE) ×3 IMPLANT
K-WIRE .062X4 (WIRE) ×3 IMPLANT
LOOP VESSEL MAXI BLUE (MISCELLANEOUS) IMPLANT
LOOP VESSEL MINI RED (MISCELLANEOUS) IMPLANT
NDL HYPO 25X1 1.5 SAFETY (NEEDLE) IMPLANT
NEEDLE HYPO 25X1 1.5 SAFETY (NEEDLE) IMPLANT
NS IRRIG 1000ML POUR BTL (IV SOLUTION) ×3 IMPLANT
PACK BASIN DAY SURGERY FS (CUSTOM PROCEDURE TRAY) ×3 IMPLANT
PAD CAST 4YDX4 CTTN HI CHSV (CAST SUPPLIES) IMPLANT
PADDING CAST ABS 4INX4YD NS (CAST SUPPLIES) ×1
PADDING CAST ABS COTTON 4X4 ST (CAST SUPPLIES) ×2 IMPLANT
PADDING CAST COTTON 4X4 STRL (CAST SUPPLIES) ×1
SLEEVE SCD COMPRESS KNEE MED (MISCELLANEOUS) ×3 IMPLANT
SLING ARM FOAM STRAP LRG (SOFTGOODS) ×1 IMPLANT
SPLINT PLASTER CAST XFAST 3X15 (CAST SUPPLIES) IMPLANT
SPLINT PLASTER CAST XFAST 4X15 (CAST SUPPLIES) IMPLANT
SPLINT PLASTER XTRA FAST SET 4 (CAST SUPPLIES) ×8
SPLINT PLASTER XTRA FASTSET 3X (CAST SUPPLIES)
STOCKINETTE 6  STRL (DRAPES) ×1
STOCKINETTE 6 STRL (DRAPES) ×2 IMPLANT
SUT ETHIBOND 2-0 V-5 NDL (SUTURE) IMPLANT
SUT ETHIBOND 2-0 V-5 NEEDLE (SUTURE) IMPLANT
SUT ETHIBOND 3-0 V-5 (SUTURE) ×3 IMPLANT
SUT STEEL 4 (SUTURE) ×3 IMPLANT
SUT VICRYL RAPIDE 4-0 (SUTURE) ×2 IMPLANT
SUT VICRYL RAPIDE 4/0 PS 2 (SUTURE) ×3 IMPLANT
SYR 10ML LL (SYRINGE) IMPLANT
SYR BULB 3OZ (MISCELLANEOUS) ×3 IMPLANT
TOWEL GREEN STERILE FF (TOWEL DISPOSABLE) ×6 IMPLANT
UNDERPAD 30X30 (UNDERPADS AND DIAPERS) ×3 IMPLANT

## 2019-01-19 NOTE — Interval H&P Note (Signed)
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  The various methods of treatment have been discussed with the patient and family. After consideration of risks, benefits and other options for treatment, the patient has consented to  Procedure(s) with comments: RIGHT THUMB SUSPENSION ARTHROPLASTY (Right) - PREOP BLOCK ENDOSCOPIC CARPAL TUNNEL RELEASE (Right) as a surgical intervention.  The patient's history has been reviewed, patient examined, no change in status, stable for surgery.  I have reviewed the patient's chart and labs.  Questions were answered to the patient's satisfaction.     Jolyn Nap

## 2019-01-19 NOTE — Anesthesia Procedure Notes (Signed)
Procedure Name: LMA Insertion Date/Time: 01/19/2019 7:40 AM Performed by: Signe Colt, CRNA Pre-anesthesia Checklist: Patient identified, Emergency Drugs available, Suction available and Patient being monitored Patient Re-evaluated:Patient Re-evaluated prior to induction Oxygen Delivery Method: Circle system utilized Preoxygenation: Pre-oxygenation with 100% oxygen Induction Type: IV induction Ventilation: Mask ventilation without difficulty LMA: LMA inserted LMA Size: 4.0 Number of attempts: 1 Airway Equipment and Method: Bite block Placement Confirmation: positive ETCO2 Tube secured with: Tape Dental Injury: Teeth and Oropharynx as per pre-operative assessment

## 2019-01-19 NOTE — Discharge Instructions (Signed)
Discharge Instructions   You have a dressing with a plaster splint incorporated in it. Move your fingers as much as possible, making a full fist and fully opening the fist. Elevate your hand to reduce pain & swelling of the digits.  Ice over the operative site may be helpful to reduce pain & swelling.  DO NOT USE HEAT. Pain medicine has been prescribed for you.  Take 15 mg of Meloxicam 1 time per day and 650 mg of Tylenol every 6 hours. Take Oxycodone additionally for severe post operative pain. Leave the dressing in place until you return to our office.  You may shower, but keep the bandage clean & dry.  You may drive a car when you are off of prescription pain medications and can safely control your vehicle with both hands. Our office will call you to arrange follow-up   Please call [phone] during normal business hours or [phone] after hours for any problems. Including the following:  - excessive redness of the incisions - drainage for more than 4 days - fever of more than 101.5 F  *Please note that pain medications will not be refilled after hours or on weekends.  Work Status: Out of work until first post operative appointment.   Post Anesthesia Home Care Instructions  Activity: Get plenty of rest for the remainder of the day. A responsible individual must stay with you for 24 hours following the procedure.  For the next 24 hours, DO NOT: -Drive a car -Paediatric nurse -Drink alcoholic beverages -Take any medication unless instructed by your physician -Make any legal decisions or sign important papers.  Meals: Start with liquid foods such as gelatin or soup. Progress to regular foods as tolerated. Avoid greasy, spicy, heavy foods. If nausea and/or vomiting occur, drink only clear liquids until the nausea and/or vomiting subsides. Call your physician if vomiting continues.  Special Instructions/Symptoms: Your throat may feel dry or sore from the anesthesia or the  breathing tube placed in your throat during surgery. If this causes discomfort, gargle with warm salt water. The discomfort should disappear within 24 hours.  If you had a scopolamine patch placed behind your ear for the management of post- operative nausea and/or vomiting:  1. The medication in the patch is effective for 72 hours, after which it should be removed.  Wrap patch in a tissue and discard in the trash. Wash hands thoroughly with soap and water. 2. You may remove the patch earlier than 72 hours if you experience unpleasant side effects which may include dry mouth, dizziness or visual disturbances. 3. Avoid touching the patch. Wash your hands with soap and water after contact with the patch.     Regional Anesthesia Blocks  1. Numbness or the inability to move the "blocked" extremity may last from 3-48 hours after placement. The length of time depends on the medication injected and your individual response to the medication. If the numbness is not going away after 48 hours, call your surgeon.  2. The extremity that is blocked will need to be protected until the numbness is gone and the  Strength has returned. Because you cannot feel it, you will need to take extra care to avoid injury. Because it may be weak, you may have difficulty moving it or using it. You may not know what position it is in without looking at it while the block is in effect.  3. For blocks in the legs and feet, returning to weight bearing and walking needs to  be done carefully. You will need to wait until the numbness is entirely gone and the strength has returned. You should be able to move your leg and foot normally before you try and bear weight or walk. You will need someone to be with you when you first try to ensure you do not fall and possibly risk injury.  4. Bruising and tenderness at the needle site are common side effects and will resolve in a few days.  5. Persistent numbness or new problems with movement  should be communicated to the surgeon or the [facility] [phone] [place] [phone]).

## 2019-01-19 NOTE — Op Note (Signed)
01/19/2019  7:33 AM  PATIENT:  Monique Wiggins  53 y.o. 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    The risks benefits and alternatives were discussed with the patient preoperatively including but not limited to the risks of infection, bleeding, nerve injury, cardiopulmonary complications, the need for revision surgery, among others, and the patient verbalized understanding and consented to proceed.  OPERATIVE IMPLANTS: None  OPERATIVE FINDINGS: Tight carpal tunnel, acceptably decompressed following release  OPERATIVE PROCEDURE:  After receiving prophylactic antibiotics and a regional block, the patient was escorted to the operative theatre and placed in a supine position.  General anesthesia was administered.  A surgical "time-out" was performed during which the planned procedure, proposed operative site, and the correct patient identity were compared to the operative consent and agreement confirmed by the circulating nurse according to current facility policy.  Following application of a tourniquet to the operative extremity, the exposed skin was prepped with Chloraprep and draped in the usual sterile fashion.  The limb was exsanguinated with an Esmarch bandage and the tourniquet inflated to approximately 110mmHg higher than systolic BP.   The endoscopic carpal tunnel release was addressed first.  The incisions were made sharply. Subcutaneous  tissues were dissected with blunt and spreading dissection. At the proximal incision, the deep forearm fascia was split in line with the skin incision the distal edge grasped with a hemostat. At the mid palmar incision, the palmar fascia was split in line with the skin incision, revealing the underlying superficial palmar arch which was visualized with loupe assisted magnification. The synovial reflector was then introduced into the proximal incision and passed through the carpal canal, uses to reflect synovium from the deep surface of the transverse carpal ligament. It was removed and replaced with the slotted cannula and blunt obturator, passing from proximal to distal and exiting the distal wound superficial to the superficial palmar arch. The obturator was removed and the camera inserted. Visualization of the ligament was acceptable. The triangle shape blade was then inserted distally, advanced to the midportion of the ligament, and there used to create a perforation in the ligament. This instrument was removed and the hooked nstrument was inserted. It was placed into the perforation in the ligament and withdrawn distally, completing transection of the distal half of the ligament. The camera was then removed and placed into the distal end of the cannula. The hooked instrument was placed into the proximal end and advanced facility to be placed into the apex of the V. which had been formed to the distal hemi-transection of the ligament. It was withdrawn proximally, completing transection of the ligament. The adequacy of the release was judged with the scope and the instruments used as a probe. All of the endoscopic instruments are removed and the adequacy of release was again judged from the proximal incision perspective with direct loupe assisted visualization. In addition, the proximal forearm fascia was split for 2 inches proximal to the proximal incision under direct visualization using a sliding scissor  technique. The tourniquet was released, the wound copiously irrigated.   Next, the thumb arthritis was addressed.  A Waggoner approach was made, elevating the skin and reflecting the crossing cutaneous nerves.  The thenar muscles were reflected extraperiosteal he and there was a fairly well-developed portion of the APL inserting directly into the thenar musculature fascia.  The capsule was then incised and reflected exposing the trapezium.  The joint was inspected found to be sufficiently arthritic to continue in the trapezium was excised piecemeal.  The FCR was protected.  The base of the first metacarpal was resected with a saw and a tunnel made for passage of the tendon by first using a K wire followed by 2 different drill bit to the ultimate size 3.5.  The MP joint was again assessed and found not to be significantly hyperextensible.  EPB tenodesis was not performed.  A longitudinal half strip of FCR was harvested in standard fashion through a separate more proximal incision and divided all the way back to its insertion on the second metacarpal.  A Ethibond suture was placed into the depths to allow for securing of the FCR tendon and the space was irrigated.  The tendon was brought through the tunnel, back down to the dorsal capsule and tied in a half hitch upon itself around the intact portion of tendon, with the thumb ray neither compressed nor distracted..  The tendon was then fan folded over the suture in the depths, which was brought through the tendon not and tied to secure all of the interposition material within the resection space.  Again the wound was irrigated and the capsule closed with 3-0 Ethibond suture.  Tourniquet was released, additional hemostasis obtained with bipolar electrocautery and the skin was closed at all incisions with 4-0 Vicryl Rapide running horizontal mattress versus interrupted sutures.  A short arm thumb spica splint dressing was applied and she was awakened and taken to the  recovery room in stable condition, breathing spontaneously.  DISPOSITION:  The patient will be discharged home today, returning in 10-15 days for re-assessment, with follow-on hand therapy appointment for custom splint fabrication and initiation of rehab

## 2019-01-19 NOTE — Anesthesia Procedure Notes (Signed)
Anesthesia Regional Block: Supraclavicular block   Pre-Anesthetic Checklist: ,, timeout performed, Correct Patient, Correct Site, Correct Laterality, Correct Procedure, Correct Position, site marked, Risks and benefits discussed,  Surgical consent,  Pre-op evaluation,  At surgeon's request and post-op pain management  Laterality: Right  Prep: chloraprep       Needles:  Injection technique: Single-shot  Needle Type: Echogenic Stimulator Needle     Needle Length: 5cm  Needle Gauge: 22     Additional Needles:   Procedures:, nerve stimulator,,, ultrasound used (permanent image in chart),,,,   Nerve Stimulator or Paresthesia:  Response: hand, 0.45 mA,   Additional Responses:   Narrative:  Start time: 01/19/2019 7:20 AM End time: 01/19/2019 7:25 AM Injection made incrementally with aspirations every 5 mL.  Performed by: Personally  Anesthesiologist: Janeece Riggers, MD  Additional Notes: Functioning IV was confirmed and monitors were applied.  A 28mm 22ga Arrow echogenic stimulator needle was used. Sterile prep and drape,hand hygiene and sterile gloves were used. Ultrasound guidance: relevant anatomy identified, needle position confirmed, local anesthetic spread visualized around nerve(s)., vascular puncture avoided.  Image printed for medical record. Negative aspiration and negative test dose prior to incremental administration of local anesthetic. The patient tolerated the procedure well.

## 2019-01-19 NOTE — Anesthesia Postprocedure Evaluation (Signed)
Anesthesia Post Note  Patient: Monique Wiggins  Procedure(s) Performed: RIGHT THUMB SUSPENSION ARTHROPLASTY (Right Thumb) ENDOSCOPIC CARPAL TUNNEL RELEASE (Right Hand)     Patient location during evaluation: PACU Anesthesia Type: General Level of consciousness: awake and alert Pain management: pain level controlled Vital Signs Assessment: post-procedure vital signs reviewed and stable Respiratory status: spontaneous breathing, nonlabored ventilation, respiratory function stable and patient connected to nasal cannula oxygen Cardiovascular status: blood pressure returned to baseline and stable Postop Assessment: no apparent nausea or vomiting Anesthetic complications: no    Last Vitals:  Vitals:   01/19/19 1000 01/19/19 1015  BP: 123/74 136/85  Pulse: 74 73  Resp: 12 18  Temp:  36.9 C  SpO2: 92% 96%    Last Pain:  Vitals:   01/19/19 1015  TempSrc:   PainSc: 0-No pain                 Altin Sease

## 2019-01-19 NOTE — Interval H&P Note (Signed)
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  The various methods of treatment have been discussed with the patient and family. After consideration of risks, benefits and other options for treatment, the patient has consented to  Procedure(s) with comments: RIGHT THUMB SUSPENSION ARTHROPLASTY (Right) - PREOP BLOCK ENDOSCOPIC CARPAL TUNNEL RELEASE (Right) as a surgical intervention.  The patient's history has been reviewed, patient examined, no change in status, stable for surgery.  I have reviewed the patient's chart and labs.  Questions were answered to the patient's satisfaction.     Jolyn Nap

## 2019-01-19 NOTE — Transfer of Care (Signed)
Immediate Anesthesia Transfer of Care Note  Patient: Monique Wiggins  Procedure(s) Performed: RIGHT THUMB SUSPENSION ARTHROPLASTY (Right Thumb) ENDOSCOPIC CARPAL TUNNEL RELEASE (Right Hand)  Patient Location: PACU  Anesthesia Type:GA combined with regional for post-op pain  Level of Consciousness: drowsy and patient cooperative  Airway & Oxygen Therapy: Patient Spontanous Breathing and Patient connected to nasal cannula oxygen  Post-op Assessment: Report given to RN and Post -op Vital signs reviewed and stable  Post vital signs: Reviewed and stable  Last Vitals:  Vitals Value Taken Time  BP    Temp    Pulse 73 01/19/19 0850  Resp    SpO2 92 % 01/19/19 0850  Vitals shown include unvalidated device data.  Last Pain:  Vitals:   01/19/19 0640  TempSrc: Oral  PainSc: 5       Patients Stated Pain Goal: 3 (91/66/06 0045)  Complications: No apparent anesthesia complications

## 2019-01-19 NOTE — Anesthesia Post-op Follow-up Note (Signed)
  Anesthesia Pain Follow-up Note  Patient: Monique Wiggins   Date of Follow-up: 01/19/2019 Time: 3:32 PM  Talked with pt on phone after discussing the pt with Dr. Eligha Bridegroom.  Apparently, pt bought a pulse oximeter and is concerned with the reading of 88%.  I advised her to return to the [facility] for evaluation.  She agreed and will return in 15 minutes.    Cindia Hustead DANIEL

## 2019-01-19 NOTE — Addendum Note (Signed)
Addendum  created 01/19/19 1534 by Duane Boston, MD   Clinical Note Signed

## 2019-01-19 NOTE — Addendum Note (Signed)
Addendum  created 01/19/19 1619 by Duane Boston, MD   Clinical Note Signed

## 2019-01-19 NOTE — Progress Notes (Signed)
Assisted Dr. Oddono with right, ultrasound guided, supraclavicular block. Side rails up, monitors on throughout procedure. See vital signs in flow sheet. Tolerated Procedure well. 

## 2019-01-19 NOTE — Anesthesia Post-op Follow-up Note (Signed)
  Anesthesia Pain Follow-up Note  Patient: Monique Wiggins  Date of Follow-up: 01/19/2019 Time: 4:15 PM  Ms. Sharp and her husband returned for evaluation.  Her O2 sat on RA was 92-96.  Her home monitor was 90-93.  She was alert and comfortable with the block in no distress and meets discharge criteria.  She was instructed to go to the ER if she developed problems.  She voiced understanding.     Dyonna Jaspers DANIEL

## 2019-01-19 NOTE — Telephone Encounter (Signed)
Incoming call from Patient who states that she had outpatient surgery this am.  Patient stopped to purchase an O2 sat machine.  Due the fact her Sats dropped during the surgery.  States that she was not sedated during surgery .  During the call O2 rose to 91to 92%, when Patient first got home O25 ranged from 83% to 87%.  Patient reports that she is using her Chiropodist.  Encouraged patient to continue to use IS.  If O2 drops to 89% and below to call back and seek Urgent care .  Patient voices understanding.   Call back to assess heart rate.  Patient reported that it was 80 at that timeO2 sat was 90 @that  time.  Related that I would call back to check on her.   Reason for Disposition . [1] MILD longstanding difficulty breathing AND [2]  SAME as normal  Answer Assessment - Initial Assessment Questions 1. RESPIRATORY STATUS: "Describe your breathing?" (e.g., wheezing, shortness of breath, unable to speak, severe coughing)      Sob after climbing stairs 2. ONSET: "When did this breathing problem begin?"     Today  3. PATTERN "Does the difficult breathing come and go, or has it been constant since it started?"      Comes and goes.  4. SEVERITY: "How bad is your breathing?" (e.g., mild, moderate, severe)    - MILD: No SOB at rest, mild SOB with walking, speaks normally in sentences, can lay down, no retractions, pulse < 100.    - MODERATE: SOB at rest, SOB with minimal exertion and prefers to sit, cannot lie down flat, speaks in phrases, mild retractions, audible wheezing, pulse 100-120.    - SEVERE: Very SOB at rest, speaks in single words, struggling to breathe, sitting hunched forward, retractions, pulse > 120      mildly 5. RECURRENT SYMPTOM: "Have you had difficulty breathing before?" If so, ask: "When was the last time?" and "What happened that time?"      Yes after outpatient suregery 6. CARDIAC HISTORY: "Do you have any history of heart disease?" (e.g., heart attack, angina, bypass  surgery, angioplasty)     denies 7. LUNG HISTORY: "Do you have any history of lung disease?"  (e.g., pulmonary embolus, asthma, emphysema)    Denies  8. CAUSE: "What do you think is causing the breathing problem?"       9. OTHER SYMPTOMS: "Do you have any other symptoms? (e.g., dizziness, runny nose, cough, chest pain, fever)     Denies  10. PREGNANCY: "Is there any chance you are pregnant?" "When was your last menstrual period?"       na 11. TRAVEL: "Have you traveled out of the country in the last month?" (e.g., travel history, exposures)       *No Answer*  Protocols used: BREATHING DIFFICULTY-A-AH

## 2019-01-20 ENCOUNTER — Encounter (HOSPITAL_BASED_OUTPATIENT_CLINIC_OR_DEPARTMENT_OTHER): Payer: Self-pay | Admitting: Orthopedic Surgery

## 2019-01-20 MED FILL — POTASSIUM CHLORIDE CRYS ER: 20 | 30 days supply | Qty: 30 | Fill #4

## 2019-01-20 MED FILL — LOSARTAN-HCTZ 100-25 MG TAB: 100-25 | 30 days supply | Qty: 30 | Fill #1

## 2019-01-20 MED FILL — MELOXICAM 15 MG TABLET: 15 | 30 days supply | Qty: 30 | Fill #1

## 2019-01-28 ENCOUNTER — Encounter (INDEPENDENT_AMBULATORY_CARE_PROVIDER_SITE_OTHER): Payer: Self-pay | Admitting: Bariatrics

## 2019-01-28 ENCOUNTER — Telehealth (INDEPENDENT_AMBULATORY_CARE_PROVIDER_SITE_OTHER): Payer: 59 | Admitting: Bariatrics

## 2019-01-28 ENCOUNTER — Other Ambulatory Visit: Payer: Self-pay

## 2019-01-28 DIAGNOSIS — Z6835 Body mass index (BMI) 35.0-35.9, adult: Secondary | ICD-10-CM

## 2019-01-28 DIAGNOSIS — K5903 Drug induced constipation: Secondary | ICD-10-CM

## 2019-01-28 DIAGNOSIS — F3289 Other specified depressive episodes: Secondary | ICD-10-CM | POA: Diagnosis not present

## 2019-01-28 DIAGNOSIS — E119 Type 2 diabetes mellitus without complications: Secondary | ICD-10-CM | POA: Diagnosis not present

## 2019-01-29 ENCOUNTER — Ambulatory Visit (INDEPENDENT_AMBULATORY_CARE_PROVIDER_SITE_OTHER): Payer: 59 | Admitting: Psychology

## 2019-01-29 DIAGNOSIS — S6411XD Injury of median nerve at wrist and hand level of right arm, subsequent encounter: Secondary | ICD-10-CM | POA: Diagnosis not present

## 2019-01-29 DIAGNOSIS — M1811 Unilateral primary osteoarthritis of first carpometacarpal joint, right hand: Secondary | ICD-10-CM | POA: Diagnosis not present

## 2019-02-02 DIAGNOSIS — M1811 Unilateral primary osteoarthritis of first carpometacarpal joint, right hand: Secondary | ICD-10-CM | POA: Diagnosis not present

## 2019-02-02 DIAGNOSIS — S6411XD Injury of median nerve at wrist and hand level of right arm, subsequent encounter: Secondary | ICD-10-CM | POA: Diagnosis not present

## 2019-02-02 DIAGNOSIS — M47816 Spondylosis without myelopathy or radiculopathy, lumbar region: Secondary | ICD-10-CM | POA: Diagnosis not present

## 2019-02-02 NOTE — Progress Notes (Signed)
Office: [phone]  /  Fax: [phone] TeleHealth Visit:  Monique Wiggins has verbally consented to this TeleHealth visit today. The patient is located at home, the provider is located at the [employer] [facility] office. The participants in this visit include the listed provider and patient and any and all parties involved. The visit was conducted today via WebEx.  HPI:   Chief Complaint: OBESITY Monique Wiggins is here to discuss her progress with her obesity treatment plan. She is on the Category 3 plan and is following her eating plan approximately 0 % of the time. She states she is exercising 0 minutes 0 times per week. Monique Wiggins states that she has lost one pound. She states that she is struggling with constipation. Monique Wiggins had surgery and she is recovering (joint replacement in thumb and carpal tunnel). We were unable to weigh the patient today for this TeleHealth visit. She feels as if she has lost weight since her last visit. She has lost 1 lb since starting treatment with Monique Wiggins.  Diabetes II without complications Monique Wiggins has a diagnosis of diabetes type II. Monique Wiggins denies any lows. Last A1c was at 7.7 Monique Wiggins is taking metformin. She has been working on intensive lifestyle modifications including diet, exercise, and weight loss to help control her blood glucose levels.  Constipation [name] notes constipation. She was taking a narcotic after surgery. [name] is taking a stool softener.  Depression with emotional eating behaviors Monique Wiggins struggles with emotional eating and using food for comfort to the extent that it is negatively impacting her health. She often snacks when she is not hungry. Monique Wiggins sometimes feels she is out of control and then feels guilty that she made poor food choices. Monique Wiggins is taking Zoloft. She has been working on behavior modification techniques to help reduce her emotional eating and has been somewhat successful. She shows no sign of suicidal or  homicidal ideations.  ASSESSMENT AND PLAN:  Other depression  Diabetes mellitus without complication (HCC)  Drug-induced constipation  Class 2 severe obesity with serious comorbidity and body mass index (BMI) of 35.0 to 35.9 in adult, unspecified obesity type ([place])  PLAN:  Diabetes II without complications Monique Wiggins has been given extensive diabetes education by myself today including ideal fasting and post-prandial blood glucose readings, individual ideal Hgb A1c goals and hypoglycemia prevention. We discussed the importance of good blood sugar control to decrease the likelihood of diabetic complications such as nephropathy, neuropathy, limb loss, blindness, coronary artery disease, and death. We discussed the importance of intensive lifestyle modification including diet, exercise and weight loss as the first line treatment for diabetes. Monique Wiggins agrees to continue her diabetes medications and will follow up at the agreed upon time.  Constipation [name] was advised to increase her H20 intake and work on increasing her fiber intake. High fiber foods were discussed today. Monique Wiggins will begin to take milk of magnesia or dulcolax. She will take  citracal or miralax daily. She will add flax seed or wheat bran in her yogurt. Monique Wiggins agrees to follow up as directed.  Depression with Emotional Eating Behaviors We discussed behavior modification techniques today to help Monique Wiggins deal with her emotional eating and depression. She will continue Zoloft 50 mg daily and follow up as directed.  Obesity Monique Wiggins is currently in the action stage of change. As such, her goal is to continue with weight loss efforts She has agreed to follow the Category 3 plan Monique Wiggins has been instructed to work up to a goal of 150 minutes  of combined cardio and strengthening exercise per week for weight loss and overall health benefits. We discussed the following Behavioral Modification Strategies today: increase H2O intake, no  skipping meals, keeping healthy foods in the home, increasing lean protein intake, decreasing simple carbohydrates, increasing vegetables, decrease eating out and work on meal planning and intentional eating  Monique Wiggins has agreed to follow up with our clinic in 2 weeks. She was informed of the importance of frequent follow up visits to maximize her success with intensive lifestyle modifications for her multiple health conditions.  ALLERGIES: Allergies  Allergen Reactions   Pollen Extract Swelling   Shellfish Allergy Swelling    MEDICATIONS: Current Outpatient Medications on File Prior to Visit  Medication Sig Dispense Refill   acetaminophen (TYLENOL) 325 MG tablet Take 2 tablets (650 mg total) by mouth every 6 (six) hours.     albuterol (VENTOLIN HFA) 108 (90 Base) MCG/ACT inhaler Inhale 2 puffs into the lungs every 6 (six) hours as needed for wheezing or shortness of breath. 1 Inhaler 2   amLODipine (NORVASC) 5 MG tablet Take 1 tablet (5 mg total) by mouth daily. 90 tablet 1   atorvastatin (LIPITOR) 10 MG tablet Take 1 tablet (10 mg total) by mouth daily. 90 tablet 1   carvedilol (COREG) 25 MG tablet Take 1 tablet (25 mg total) by mouth 2 (two) times daily with a meal. 180 tablet 1   cetirizine (ZYRTEC) 10 MG tablet Take 1 tablet (10 mg total) by mouth daily. 30 tablet 11   Continuous Blood Gluc Sensor (FREESTYLE LIBRE 14 DAY SENSOR) MISC 1 each by Does not apply route every 14 (fourteen) days. 2 each 0   fluticasone (FLONASE) 50 MCG/ACT nasal spray Place 2 sprays into both nostrils daily. 16 g 1   hydrochlorothiazide (HYDRODIURIL) 25 MG tablet Take 25 mg by mouth daily.     losartan-hydrochlorothiazide (HYZAAR) 100-25 MG tablet Take 1 tablet by mouth daily. 90 tablet 1   meloxicam (MOBIC) 15 MG tablet Take 1 tablet (15 mg total) by mouth daily as needed for pain. 21 tablet 0   metFORMIN (GLUCOPHAGE) 500 MG tablet Take 1 tablet (500 mg total) by mouth 2 (two) times daily with a  meal. 180 tablet 1   mometasone (ELOCON) 0.1 % cream Apply 1 application topically daily. 45 g 2   neomycin-polymyxin-hydrocortisone (CORTISPORIN) OTIC solution Apply 1-2 drops to toes twice a day until all gone 10 mL 0   omeprazole (PRILOSEC) 40 MG capsule Take 1 capsule (40 mg total) by mouth daily. 30 capsule 1   oxyCODONE (ROXICODONE) 5 MG immediate release tablet Take 1 tablet (5 mg total) by mouth every 6 (six) hours as needed for severe pain. 20 tablet 0   potassium chloride SA (K-DUR,KLOR-CON) 20 MEQ tablet TAKE 1 TABLET (20 MEQ TOTAL) BY MOUTH DAILY. 30 tablet 5   sertraline (ZOLOFT) 50 MG tablet Take 1 tablet (50 mg total) by mouth daily. 90 tablet 1   topiramate (TOPAMAX) 50 MG tablet TAKE 1 TABLET (50 MG TOTAL) BY MOUTH DAILY. 30 tablet 5   tretinoin (RETIN-A) 0.025 % cream APPLY TO THE AFFECTED AREA(S) AT BEDTIME 45 g 2   No current facility-administered medications on file prior to visit.     PAST MEDICAL HISTORY: Past Medical History:  Diagnosis Date   Allergy    Arthritis    Back pain    Carpal tunnel syndrome    Diabetes mellitus without complication (HCC)    DVT (deep venous thrombosis) (  [place])    after long car trip   Dyspnea    GERD (gastroesophageal reflux disease)    H. pylori infection    1997   Heart murmur    Hypertension    Hypertriglyceridemia 02/22/2017   Hypertriglyceridemia 02/22/2017   Migraines    Sickle cell trait ([place])    Sleep apnea    states she no longer has OSA lost weight    PAST SURGICAL HISTORY: Past Surgical History:  Procedure Laterality Date   ABDOMINAL HYSTERECTOMY     ovaries left in place   CARPAL TUNNEL RELEASE Right 01/19/2019   Procedure: ENDOSCOPIC CARPAL TUNNEL RELEASE;  Surgeon: Milly Jakob, MD;  Location: [facility];  Service: Orthopedics;  Laterality: Right;   CARPOMETACARPEL SUSPENSION PLASTY Right 01/19/2019   Procedure: RIGHT THUMB SUSPENSION ARTHROPLASTY;  Surgeon: Milly Jakob, MD;  Location: [facility];  Service: Orthopedics;  Laterality: Right;  PREOP BLOCK   TOE SURGERY     left great toe, repair of severed tendon   TONSILLECTOMY     tummy tuck      SOCIAL HISTORY: Social History   Tobacco Use   Smoking status: Never Smoker   Smokeless tobacco: Never Used  Substance Use Topics   Alcohol use: No    Comment: never   Drug use: No    FAMILY HISTORY: Family History  Problem Relation Age of Onset   Diabetes type II Mother    Hypertension Mother    Diabetes Mother    Diabetes Mellitus II Father    Pulmonary fibrosis Father    Diabetes Father    Hypertension Father    Pulmonary disease Father    High Cholesterol Father    Hypertension Sister    Diabetes Sister    Cancer Maternal Grandmother        colon   Allergic Disorder Son    Cancer Maternal Grandfather        lung, smoker   Allergic Disorder Son    Asthma Son    Colon cancer Neg Hx    Esophageal cancer Neg Hx    Pancreatic cancer Neg Hx    Stomach cancer Neg Hx     ROS: Review of Systems  Constitutional: Positive for weight loss.  Gastrointestinal: Positive for constipation.  Endo/Heme/Allergies:       Negative for hypoglycemia  Psychiatric/Behavioral: Positive for depression. Negative for suicidal ideas.    PHYSICAL EXAM: Pt in no acute distress  RECENT LABS AND TESTS: BMET    Component Value Date/Time   NA 140 01/15/2019 1026   NA 137 12/01/2018 0000   K 4.1 01/15/2019 1026   CL 106 01/15/2019 1026   CO2 24 01/15/2019 1026   GLUCOSE 116 (H) 01/15/2019 1026   BUN 28 (H) 01/15/2019 1026   BUN 17 12/01/2018 0000   CREATININE 1.12 (H) 01/15/2019 1026   CALCIUM 9.5 01/15/2019 1026   GFRNONAA 56 (L) 01/15/2019 1026   GFRAA >60 01/15/2019 1026   Lab Results  Component Value Date   HGBA1C 7.7 (H) 12/01/2018   HGBA1C 8.0 (H) 06/30/2018   HGBA1C 7.0 (H) 03/03/2018   HGBA1C 7.2 (H) 11/28/2017   Lab Results  Component  Value Date   INSULIN 39.9 (H) 12/01/2018   CBC    Component Value Date/Time   WBC 7.8 06/30/2018 1008   RBC 5.31 (H) 06/30/2018 1008   HGB 14.5 06/30/2018 1008   HCT 43.7 06/30/2018 1008   PLT 374.0 06/30/2018 1008  MCV 82.3 06/30/2018 1008   MCH 27.0 07/22/2016 0937   MCHC 33.1 06/30/2018 1008   RDW 13.9 06/30/2018 1008   LYMPHSABS 2.3 11/28/2017 1751   MONOABS 0.6 11/28/2017 1751   EOSABS 0.3 11/28/2017 1751   BASOSABS 0.1 11/28/2017 1751   Iron/TIBC/Ferritin/ %Sat No results found for: IRON, TIBC, FERRITIN, IRONPCTSAT Lipid Panel     Component Value Date/Time   CHOL 123 12/01/2018 0000   TRIG 197 (H) 12/01/2018 0000   HDL 59 12/01/2018 0000   CHOLHDL 4 06/30/2018 1008   VLDL 52.2 (H) 06/30/2018 1008   LDLCALC 25 12/01/2018 0000   LDLDIRECT 36.0 06/30/2018 1008   Hepatic Function Panel     Component Value Date/Time   PROT 7.2 12/01/2018 0000   ALBUMIN 4.6 12/01/2018 0000   AST 18 12/01/2018 0000   ALT 45 (H) 12/01/2018 0000   ALKPHOS 99 12/01/2018 0000   BILITOT 0.4 12/01/2018 0000      Component Value Date/Time   TSH 1.53 06/30/2018 1008   TSH 1.88 03/03/2018 1735   TSH 1.89 11/28/2017 1751     Ref. Range 12/01/2018 00:00  Vitamin D, 25-Hydroxy Latest Ref Range: 30.0 - 100.0 ng/mL 105.0 (H)    I, Doreene Nest, am acting as Location manager for [employer]. Owens Shark, DO  I have reviewed the above documentation for accuracy and completeness, and I agree with the above. -Jearld Lesch, DO

## 2019-02-10 ENCOUNTER — Telehealth (INDEPENDENT_AMBULATORY_CARE_PROVIDER_SITE_OTHER): Payer: 59 | Admitting: Bariatrics

## 2019-02-10 DIAGNOSIS — S6411XD Injury of median nerve at wrist and hand level of right arm, subsequent encounter: Secondary | ICD-10-CM | POA: Diagnosis not present

## 2019-02-10 DIAGNOSIS — M1811 Unilateral primary osteoarthritis of first carpometacarpal joint, right hand: Secondary | ICD-10-CM | POA: Diagnosis not present

## 2019-02-12 ENCOUNTER — Ambulatory Visit (INDEPENDENT_AMBULATORY_CARE_PROVIDER_SITE_OTHER): Payer: 59

## 2019-02-12 ENCOUNTER — Other Ambulatory Visit: Payer: Self-pay

## 2019-02-12 DIAGNOSIS — Z23 Encounter for immunization: Secondary | ICD-10-CM | POA: Diagnosis not present

## 2019-02-20 DIAGNOSIS — S6411XD Injury of median nerve at wrist and hand level of right arm, subsequent encounter: Secondary | ICD-10-CM | POA: Diagnosis not present

## 2019-02-20 DIAGNOSIS — M1811 Unilateral primary osteoarthritis of first carpometacarpal joint, right hand: Secondary | ICD-10-CM | POA: Diagnosis not present

## 2019-02-24 DIAGNOSIS — S6411XD Injury of median nerve at wrist and hand level of right arm, subsequent encounter: Secondary | ICD-10-CM | POA: Diagnosis not present

## 2019-02-24 DIAGNOSIS — M1811 Unilateral primary osteoarthritis of first carpometacarpal joint, right hand: Secondary | ICD-10-CM | POA: Diagnosis not present

## 2019-02-26 DIAGNOSIS — M47816 Spondylosis without myelopathy or radiculopathy, lumbar region: Secondary | ICD-10-CM | POA: Diagnosis not present

## 2019-03-03 DIAGNOSIS — S6411XD Injury of median nerve at wrist and hand level of right arm, subsequent encounter: Secondary | ICD-10-CM | POA: Diagnosis not present

## 2019-03-03 DIAGNOSIS — M1811 Unilateral primary osteoarthritis of first carpometacarpal joint, right hand: Secondary | ICD-10-CM | POA: Diagnosis not present

## 2019-03-03 MED FILL — GABAPENTIN 300 MG CAPSULE: 300 | 30 days supply | Qty: 90 | Fill #0

## 2019-03-03 MED FILL — MELOXICAM 15 MG TABLET: 15 | 30 days supply | Qty: 30 | Fill #0

## 2019-03-03 MED FILL — TOPIRAMATE 50 MG TABLET: 50 | 30 days supply | Qty: 30 | Fill #1

## 2019-03-10 DIAGNOSIS — S6411XD Injury of median nerve at wrist and hand level of right arm, subsequent encounter: Secondary | ICD-10-CM | POA: Diagnosis not present

## 2019-03-10 DIAGNOSIS — M1811 Unilateral primary osteoarthritis of first carpometacarpal joint, right hand: Secondary | ICD-10-CM | POA: Diagnosis not present

## 2019-03-17 DIAGNOSIS — M1811 Unilateral primary osteoarthritis of first carpometacarpal joint, right hand: Secondary | ICD-10-CM | POA: Diagnosis not present

## 2019-03-17 DIAGNOSIS — S6411XD Injury of median nerve at wrist and hand level of right arm, subsequent encounter: Secondary | ICD-10-CM | POA: Diagnosis not present

## 2019-03-31 DIAGNOSIS — M47816 Spondylosis without myelopathy or radiculopathy, lumbar region: Secondary | ICD-10-CM | POA: Diagnosis not present

## 2019-04-02 DIAGNOSIS — G5601 Carpal tunnel syndrome, right upper limb: Secondary | ICD-10-CM | POA: Diagnosis not present

## 2019-04-02 DIAGNOSIS — M1811 Unilateral primary osteoarthritis of first carpometacarpal joint, right hand: Secondary | ICD-10-CM | POA: Diagnosis not present

## 2019-04-02 DIAGNOSIS — S6411XD Injury of median nerve at wrist and hand level of right arm, subsequent encounter: Secondary | ICD-10-CM | POA: Diagnosis not present

## 2019-04-07 DIAGNOSIS — S6411XD Injury of median nerve at wrist and hand level of right arm, subsequent encounter: Secondary | ICD-10-CM | POA: Diagnosis not present

## 2019-04-07 DIAGNOSIS — M1811 Unilateral primary osteoarthritis of first carpometacarpal joint, right hand: Secondary | ICD-10-CM | POA: Diagnosis not present

## 2019-04-13 DIAGNOSIS — M47816 Spondylosis without myelopathy or radiculopathy, lumbar region: Secondary | ICD-10-CM | POA: Diagnosis not present

## 2019-04-14 DIAGNOSIS — M1811 Unilateral primary osteoarthritis of first carpometacarpal joint, right hand: Secondary | ICD-10-CM | POA: Diagnosis not present

## 2019-04-14 DIAGNOSIS — S6411XD Injury of median nerve at wrist and hand level of right arm, subsequent encounter: Secondary | ICD-10-CM | POA: Diagnosis not present

## 2019-04-17 MED FILL — metFORMIN HCL 500 MG TABS: 500 | 90 days supply | Qty: 180 | Fill #1

## 2019-04-17 MED FILL — AMLODIPINE BESYLATE 5 MG TA: 5 | 90 days supply | Qty: 90 | Fill #1

## 2019-04-17 MED FILL — LOSARTAN-HCTZ 100-25 MG TAB: 100-25 | 30 days supply | Qty: 30 | Fill #2

## 2019-04-17 MED FILL — TOPIRAMATE 50 MG TABLET: 50 | 30 days supply | Qty: 30 | Fill #2

## 2019-04-17 MED FILL — POTASSIUM CHLORIDE CRYS ER: 20 | 30 days supply | Qty: 30 | Fill #5

## 2019-04-17 MED FILL — ATORVASTATIN 10 MG TABLET: 10 | 90 days supply | Qty: 90 | Fill #1

## 2019-04-27 ENCOUNTER — Telehealth: Payer: Self-pay | Admitting: Family Medicine

## 2019-04-27 NOTE — Telephone Encounter (Signed)
Patient requesting flu shot report please fax to [facility] [phone], patient would like confirmation thru my chart

## 2019-04-28 DIAGNOSIS — M1811 Unilateral primary osteoarthritis of first carpometacarpal joint, right hand: Secondary | ICD-10-CM | POA: Diagnosis not present

## 2019-04-28 DIAGNOSIS — S6411XD Injury of median nerve at wrist and hand level of right arm, subsequent encounter: Secondary | ICD-10-CM | POA: Diagnosis not present

## 2019-04-28 NOTE — Telephone Encounter (Signed)
Faxed sent to [facility] this am

## 2019-04-30 DIAGNOSIS — M47816 Spondylosis without myelopathy or radiculopathy, lumbar region: Secondary | ICD-10-CM | POA: Diagnosis not present

## 2019-05-05 DIAGNOSIS — M1811 Unilateral primary osteoarthritis of first carpometacarpal joint, right hand: Secondary | ICD-10-CM | POA: Diagnosis not present

## 2019-05-05 DIAGNOSIS — G5601 Carpal tunnel syndrome, right upper limb: Secondary | ICD-10-CM | POA: Diagnosis not present

## 2019-05-18 ENCOUNTER — Other Ambulatory Visit: Payer: Self-pay

## 2019-05-19 DIAGNOSIS — S6411XD Injury of median nerve at wrist and hand level of right arm, subsequent encounter: Secondary | ICD-10-CM | POA: Diagnosis not present

## 2019-05-19 NOTE — Progress Notes (Addendum)
[facility] [address], [place], [place] [address] [phone]  Date:  05/20/2019   Name:  Monique Wiggins   DOB:  11/12/1965   MRN:  [id number]  PCP:  Mosie Lukes, MD    Chief Complaint: Hyperglycemia (333, urinary frequency, brought , metformin giving diarrhea)   History of Present Illness:  Monique Wiggins is a 53 y.o. very pleasant female patient who presents with the following:  Today to discuss diabetes-primary patient of my partner Dr. Charlett Blake History of obesity, DVT, diabetes, hyperlipidemia  I have not seen this patient previously  She had a hand surgery about 3 months ago- since then her husband is doing more of the cooking and her diet has not been quite as good She had been fairly well controlled on one Metformin a day, but a couple of weeks ago she noted that frequency of urination, and became worried She checked her glucose on this past Saturday and it was 333 Since then she has been watching her diet and trying to take metformin BID- however increasing Metformin dose has caused diarrhea Her glucose was 191 this am fasting  She is trying to watch her diet, is eating low-carb for the last few days  There is a family history of DM Her vision seems blurred to her   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  She is s/p hyst for benign disease but thought she should still have a periodic pap   hyst done in her 28s, she has her ovaries Foot exam due Mammogram- order for her at GI  Patient Active Problem List   Diagnosis Date Noted  . Back pain 01/08/2019  .  Thumb pain, right 01/08/2019  . Obesity 10/12/2018  . Perimenopause 06/30/2018  . History of DVT (deep vein thrombosis) 06/30/2018  . Preventative health care 03/05/2018  . Hypokalemia 03/05/2018  . Acid reflux 03/05/2018  . Diabetes mellitus without complication ([place])   . 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 Hyperlipidemia 02/22/2017    Past Medical History:  Diagnosis Date  . Allergy   . Arthritis   . Back pain   . Carpal tunnel syndrome   . Diabetes mellitus without complication ([place])   . DVT (deep venous thrombosis) ([place])    after long car trip  . Dyspnea   . GERD (gastroesophageal reflux disease)   . H. pylori infection    1997  . Heart murmur   . Hypertension   . Hypertriglyceridemia 02/22/2017  . Hypertriglyceridemia 02/22/2017  . Migraines   . Sickle cell trait ([place])   . Sleep apnea    states she no longer has OSA lost weight    Past Surgical History:  Procedure Laterality Date  . ABDOMINAL HYSTERECTOMY     ovaries left in place  . CARPAL TUNNEL RELEASE Right 01/19/2019   Procedure: ENDOSCOPIC CARPAL TUNNEL RELEASE;  Surgeon: Milly Jakob, MD;  Location: [facility];  Service: Orthopedics;  Laterality: Right;  . CARPOMETACARPEL SUSPENSION PLASTY Right 01/19/2019   Procedure: RIGHT THUMB SUSPENSION ARTHROPLASTY;  Surgeon: Grandville Silos,  Shanon Brow, MD;  Location: [facility];  Service: Orthopedics;  Laterality: Right;  PREOP BLOCK  . TOE SURGERY     left great toe, repair of severed tendon  . TONSILLECTOMY    . tummy tuck      Social History   Tobacco Use  . Smoking status: Never Smoker  . Smokeless tobacco: Never Used  Substance Use Topics  . Alcohol use: No    Comment: never  . Drug use: No    Family History  Problem Relation Age of Onset  . Diabetes type II Mother   . Hypertension Mother   . Diabetes Mother   . Diabetes Mellitus II Father   . Pulmonary fibrosis Father   . Diabetes Father    . Hypertension Father   . Pulmonary disease Father   . High Cholesterol Father   . Hypertension Sister   . Diabetes Sister   . Cancer Maternal Grandmother        colon  . Allergic Disorder Son   . Cancer Maternal Grandfather        lung, smoker  . Allergic Disorder Son   . Asthma Son   . Colon cancer Neg Hx   . Esophageal cancer Neg Hx   . Pancreatic cancer Neg Hx   . Stomach cancer Neg Hx     Allergies  Allergen Reactions  . Pollen Extract Swelling  . Shellfish Allergy Swelling    Medication list has been reviewed and updated.  Current Outpatient Medications on File Prior to Visit  Medication Sig Dispense Refill  . acetaminophen (TYLENOL) 325 MG tablet Take 2 tablets (650 mg total) by mouth every 6 (six) hours.    Marland Kitchen albuterol (VENTOLIN HFA) 108 (90 Base) MCG/ACT inhaler Inhale 2 puffs into the lungs every 6 (six) hours as needed for wheezing or shortness of breath. 1 Inhaler 2  . amLODipine (NORVASC) 5 MG tablet Take 1 tablet (5 mg total) by mouth daily. 90 tablet 1  . atorvastatin (LIPITOR) 10 MG tablet Take 1 tablet (10 mg total) by mouth daily. 90 tablet 1  . carvedilol (COREG) 25 MG tablet Take 1 tablet (25 mg total) by mouth 2 (two) times daily with a meal. 180 tablet 1  . cetirizine (ZYRTEC) 10 MG tablet Take 1 tablet (10 mg total) by mouth daily. 30 tablet 11  . Continuous Blood Gluc Sensor (FREESTYLE LIBRE 14 DAY SENSOR) MISC 1 each by Does not apply route every 14 (fourteen) days. 2 each 0  . fluticasone (FLONASE) 50 MCG/ACT nasal spray Place 2 sprays into both nostrils daily. 16 g 1  . hydrochlorothiazide (HYDRODIURIL) 25 MG tablet Take 25 mg by mouth daily.    Marland Kitchen losartan-hydrochlorothiazide (HYZAAR) 100-25 MG tablet Take 1 tablet by mouth daily. 90 tablet 1  . meloxicam (MOBIC) 15 MG tablet Take 1 tablet (15 mg total) by mouth daily as needed for pain. 21 tablet 0  . metFORMIN (GLUCOPHAGE) 500 MG tablet Take 1 tablet (500 mg total) by mouth 2 (two) times daily  with a meal. 180 tablet 1  . mometasone (ELOCON) 0.1 % cream Apply 1 application topically daily. 45 g 2  . neomycin-polymyxin-hydrocortisone (CORTISPORIN) OTIC solution Apply 1-2 drops to toes twice a day until all gone 10 mL 0  . omeprazole (PRILOSEC) 40 MG capsule Take 1 capsule (40 mg total) by mouth daily. 30 capsule 1  . oxyCODONE (ROXICODONE) 5 MG immediate release tablet Take 1 tablet (5 mg total) by mouth every  6 (six) hours as needed for severe pain. 20 tablet 0  . potassium chloride SA (K-DUR,KLOR-CON) 20 MEQ tablet TAKE 1 TABLET (20 MEQ TOTAL) BY MOUTH DAILY. 30 tablet 5  . sertraline (ZOLOFT) 50 MG tablet Take 1 tablet (50 mg total) by mouth daily. 90 tablet 1  . topiramate (TOPAMAX) 50 MG tablet TAKE 1 TABLET (50 MG TOTAL) BY MOUTH DAILY. 30 tablet 5  . tretinoin (RETIN-A) 0.025 % cream APPLY TO THE AFFECTED AREA(S) AT BEDTIME 45 g 2   No current facility-administered medications on file prior to visit.     Review of Systems:  As per HPI- otherwise negative. No fever or chills   Physical Examination: Vitals:   05/20/19 1541  BP: 124/80  Pulse: 70  Resp: 17  Temp: (!) 96 F (35.6 C)  SpO2: 97%   Vitals:   05/20/19 1541  Weight: 193 lb (87.5 kg)  Height: 5\' 2"  (1.575 m)   Body mass index is 35.3 kg/m. Ideal Body Weight: Weight in (lb) to have BMI = 25: 136.4  GEN: WDWN, NAD, Non-toxic, A & O x 3, obese, looks well  HEENT: Atraumatic, Normocephalic. Neck supple. No masses, No LAD. Ears and Nose: No external deformity. CV: RRR, No M/G/R. No JVD. No thrill. No extra heart sounds. PULM: CTA B, no wheezes, crackles, rhonchi. No retractions. No resp. distress. No accessory muscle use. ABD: S, NT, ND, +BS. No rebound. No HSM. EXTR: No c/c/e NEURO Normal gait.  PSYCH: Normally interactive. Conversant. Not depressed or anxious appearing.  Calm demeanor.  S/p hysterectomy-normal vulva, vaginal canal, adnexa Wt Readings from Last 3 Encounters:  05/20/19 193 lb (87.5  kg)  01/19/19 191 lb 12.8 oz (87 kg)  01/01/19 192 lb (87.1 kg)   Results for orders placed or performed in visit on 05/20/19  POCT glucose (manual entry)  Result Value Ref Range   POC Glucose 177 (A) 70 - 99 mg/dl    Assessment and Plan: Diabetes mellitus without complication ([place]) - Plan: Hemoglobin A1c, Microalbumin / creatinine urine ratio, dapagliflozin propanediol (FARXIGA) 10 MG TABS tablet, POCT glucose (manual entry)  Hyperlipidemia, unspecified hyperlipidemia type - Plan: Lipid panel  Essential hypertension - Plan: CBC, Comprehensive metabolic panel  Screening for thyroid disorder - Plan: TSH  Screening for HIV (human immunodeficiency virus) - Plan: HIV antibody  Encounter for screening mammogram for malignant neoplasm of breast - Plan: MM 3D SCREEN BREAST BILATERAL  Screening for cervical cancer - Plan: Cytology - PAP  Here today to discuss diabetes.  Audiana has been under good control on a low-dose of Metformin, but it seems that her sugar has recently gone up.  We will check an A1c for her, will go ahead and start on Farxiga 5 mg.  She has not been able to tolerate a higher dose of Metformin, so we will continue 500 mg daily for now Other labs pending as above Ordered mammogram Pap collected Patient would like to be referred to diabetes education classes, will take care of this for her This visit occurred during the SARS-CoV-2 public health emergency.  Safety protocols were in place, including screening questions prior to the visit, additional usage of staff PPE, and extensive cleaning of exam room while observing appropriate contact time as indicated for disinfecting solutions.    Signed Lamar Blinks, MD  Received her labs 12/3, message to patient Results for orders placed or performed in visit on 05/20/19  CBC  Result Value Ref Range   WBC 9.8  4.0 - 10.5 K/uL   RBC 5.09 3.87 - 5.11 Mil/uL   Platelets 348.0 150.0 - 400.0 K/uL   Hemoglobin 14.0 12.0 - 15.0  g/dL   HCT 42.6 36.0 - 46.0 %   MCV 83.7 78.0 - 100.0 fl   MCHC 32.9 30.0 - 36.0 g/dL   RDW 14.1 11.5 - 15.5 %  Comprehensive metabolic panel  Result Value Ref Range   Sodium 139 135 - 145 mEq/L   Potassium 3.9 3.5 - 5.1 mEq/L   Chloride 103 96 - 112 mEq/L   CO2 24 19 - 32 mEq/L   Glucose, Bld 167 (H) 70 - 99 mg/dL   BUN 24 (H) 6 - 23 mg/dL   Creatinine, Ser 0.97 0.40 - 1.20 mg/dL   Total Bilirubin 0.4 0.2 - 1.2 mg/dL   Alkaline Phosphatase 108 39 - 117 U/L   AST 34 0 - 37 U/L   ALT 70 (H) 0 - 35 U/L   Total Protein 7.2 6.0 - 8.3 g/dL   Albumin 4.7 3.5 - 5.2 g/dL   GFR 72.25 >60.00 mL/min   Calcium 10.1 8.4 - 10.5 mg/dL  Hemoglobin A1c  Result Value Ref Range   Hgb A1c MFr Bld 8.8 (H) 4.6 - 6.5 %  Lipid panel  Result Value Ref Range   Cholesterol 155 0 - 200 mg/dL   Triglycerides (H) 0.0 - 149.0 mg/dL    563.0 Triglyceride is over 400; calculations on Lipids are invalid.   HDL 44.30 >39.00 mg/dL   Total CHOL/HDL Ratio 4   HIV antibody  Result Value Ref Range   HIV 1&2 Ab, 4th Generation NON-REACTIVE NON-REACTI  TSH  Result Value Ref Range   TSH 0.85 0.35 - 4.50 uIU/mL  Microalbumin / creatinine urine ratio  Result Value Ref Range   Microalb, Ur 3.9 (H) 0.0 - 1.9 mg/dL   Creatinine,U 177.4 mg/dL   Microalb Creat Ratio 2.2 0.0 - 30.0 mg/g  LDL cholesterol, direct  Result Value Ref Range   Direct LDL 34.0 mg/dL  POCT glucose (manual entry)  Result Value Ref Range   POC Glucose 177 (A) 70 - 99 mg/dl   Your blood counts are normal Metabolic profile looks okay, except for increase in ALT.  This is one of your liver function tests.  Looking back, this has been elevated slightly in the past.  This may be due to fat in the liver-I am going to arrange an ultrasound of your liver to look for this.  Your A1c is high, but not terrible.  I am hoping with the addition of Farxiga and some dietary changes/increased exercise we can bring this down. Please take the 5 mg of Farxiga  for about a week, then you can increase to 10 mg assuming no low blood sugars.  Continue current dose of Metformin  Let us plan to recheck an A1c in about 3 months-of course let us know sooner if your blood sugar is not coming under control  Your triglycerides are very high, however this may be due partially due to your high blood sugar.  Otherwise your cholesterol looks okay.  Let us recheck your cholesterol once your sugars under better control  Urine protein looks okay Thyroid normal HIV negative as expected  I will order a liver ultrasound for you, please let me know if any other questions.  If all is going okay, please see Dr. Charlett Blake in about 3 months for repeat A1c and cholesterol

## 2019-05-20 ENCOUNTER — Ambulatory Visit (INDEPENDENT_AMBULATORY_CARE_PROVIDER_SITE_OTHER): Payer: 59 | Admitting: Family Medicine

## 2019-05-20 ENCOUNTER — Encounter: Payer: Self-pay | Admitting: Family Medicine

## 2019-05-20 ENCOUNTER — Other Ambulatory Visit: Payer: Self-pay

## 2019-05-20 ENCOUNTER — Other Ambulatory Visit (HOSPITAL_COMMUNITY)
Admission: RE | Admit: 2019-05-20 | Discharge: 2019-05-20 | Disposition: A | Discharge status: Home / Self Care | Payer: 59 | Source: Ambulatory Visit | Attending: Family Medicine | Admitting: Family Medicine

## 2019-05-20 VITALS — BP 124/80 | HR 70 | Temp 96.0°F | Resp 17 | Ht 62.0 in | Wt 193.0 lb

## 2019-05-20 DIAGNOSIS — Z1329 Encounter for screening for other suspected endocrine disorder: Secondary | ICD-10-CM | POA: Diagnosis not present

## 2019-05-20 DIAGNOSIS — E119 Type 2 diabetes mellitus without complications: Secondary | ICD-10-CM

## 2019-05-20 DIAGNOSIS — Z124 Encounter for screening for malignant neoplasm of cervix: Secondary | ICD-10-CM

## 2019-05-20 DIAGNOSIS — I10 Essential (primary) hypertension: Secondary | ICD-10-CM

## 2019-05-20 DIAGNOSIS — Z1231 Encounter for screening mammogram for malignant neoplasm of breast: Secondary | ICD-10-CM | POA: Diagnosis not present

## 2019-05-20 DIAGNOSIS — R7401 Elevation of levels of liver transaminase levels: Secondary | ICD-10-CM | POA: Diagnosis not present

## 2019-05-20 DIAGNOSIS — Z114 Encounter for screening for human immunodeficiency virus [HIV]: Secondary | ICD-10-CM

## 2019-05-20 DIAGNOSIS — E785 Hyperlipidemia, unspecified: Secondary | ICD-10-CM

## 2019-05-20 LAB — GLUCOSE, POCT (MANUAL RESULT ENTRY): POC Glucose: 177 mg/dl — AB (ref 70–99)

## 2019-05-20 MED ORDER — FARXIGA 10 MG PO TABS: 10.0000 mg | ORAL_TABLET | Freq: Every day | ORAL | 6 refills | Status: DC

## 2019-05-20 MED FILL — FARXIGA 10 MG TABLET: 10 | 30 days supply | Qty: 30 | Fill #0

## 2019-05-20 NOTE — Patient Instructions (Addendum)
It was good to see you today- I will be in touch with your labs asap I ordered a screening mammogram for the [facility] for you- I thought that would be most convenient  When we get your A1c we can decide about other medications for now let's plan to add farxiga, start with a 1/2 tablet for about one week  We will set you up for diabetes classes -  I will refer you for these

## 2019-05-21 ENCOUNTER — Encounter: Payer: Self-pay | Admitting: Family Medicine

## 2019-05-21 LAB — COMPREHENSIVE METABOLIC PANEL
ALT: 70 U/L — ABNORMAL HIGH (ref 0–35)
AST: 34 U/L (ref 0–37)
Albumin: 4.7 g/dL (ref 3.5–5.2)
Alkaline Phosphatase: 108 U/L (ref 39–117)
BUN: 24 mg/dL — ABNORMAL HIGH (ref 6–23)
CO2: 24 mEq/L (ref 19–32)
Calcium: 10.1 mg/dL (ref 8.4–10.5)
Chloride: 103 mEq/L (ref 96–112)
Creatinine, Ser: 0.97 mg/dL (ref 0.40–1.20)
GFR: 72.25 mL/min (ref >60.00)
Glucose, Bld: 167 mg/dL — ABNORMAL HIGH (ref 70–99)
Potassium: 3.9 mEq/L (ref 3.5–5.1)
Sodium: 139 mEq/L (ref 135–145)
Total Bilirubin: 0.4 mg/dL (ref 0.2–1.2)
Total Protein: 7.2 g/dL (ref 6.0–8.3)

## 2019-05-21 LAB — HIV ANTIBODY (ROUTINE TESTING W REFLEX): HIV 1&2 Ab, 4th Generation: NONREACTIVE

## 2019-05-21 LAB — MICROALBUMIN / CREATININE URINE RATIO
Creatinine,U: 177.4 mg/dL
Microalb Creat Ratio: 2.2 mg/g (ref 0.0–30.0)
Microalb, Ur: 3.9 mg/dL — ABNORMAL HIGH (ref 0.0–1.9)

## 2019-05-21 LAB — CBC
HCT: 42.6 % (ref 36.0–46.0)
Hemoglobin: 14 g/dL (ref 12.0–15.0)
MCHC: 32.9 g/dL (ref 30.0–36.0)
MCV: 83.7 fl (ref 78.0–100.0)
Platelets: 348 10*3/uL (ref 150.0–400.0)
RBC: 5.09 Mil/uL (ref 3.87–5.11)
RDW: 14.1 % (ref 11.5–15.5)
WBC: 9.8 10*3/uL (ref 4.0–10.5)

## 2019-05-21 LAB — LIPID PANEL
Cholesterol: 155 mg/dL (ref 0–200)
HDL: 44.3 mg/dL (ref >39.00)
Total CHOL/HDL Ratio: 4
Triglycerides: 563 mg/dL — ABNORMAL HIGH (ref 0.0–149.0)

## 2019-05-21 LAB — TSH: TSH: 0.85 u[IU]/mL (ref 0.35–4.50)

## 2019-05-21 LAB — LDL CHOLESTEROL, DIRECT: Direct LDL: 34 mg/dL

## 2019-05-21 LAB — HEMOGLOBIN A1C: Hgb A1c MFr Bld: 8.8 % — ABNORMAL HIGH (ref 4.6–6.5)

## 2019-05-21 MED FILL — LOSARTAN-HCTZ 100-25 MG TAB: 100-25 | 30 days supply | Qty: 30 | Fill #3

## 2019-05-21 NOTE — Addendum Note (Signed)
Addended by: Lamar Blinks C on: 05/21/2019 06:09 PM   Modules accepted: Orders

## 2019-05-25 ENCOUNTER — Ambulatory Visit (HOSPITAL_BASED_OUTPATIENT_CLINIC_OR_DEPARTMENT_OTHER): Payer: 59

## 2019-05-25 ENCOUNTER — Encounter: Payer: Self-pay | Admitting: Family Medicine

## 2019-05-25 ENCOUNTER — Other Ambulatory Visit: Payer: 59

## 2019-05-25 LAB — CYTOLOGY - PAP
Comment: NEGATIVE
Diagnosis: NEGATIVE
High risk HPV: NEGATIVE

## 2019-05-27 ENCOUNTER — Ambulatory Visit (INDEPENDENT_AMBULATORY_CARE_PROVIDER_SITE_OTHER): Payer: 59

## 2019-05-27 ENCOUNTER — Other Ambulatory Visit: Payer: Self-pay

## 2019-05-27 DIAGNOSIS — K760 Fatty (change of) liver, not elsewhere classified: Secondary | ICD-10-CM | POA: Diagnosis not present

## 2019-05-27 DIAGNOSIS — R7401 Elevation of levels of liver transaminase levels: Secondary | ICD-10-CM

## 2019-05-29 ENCOUNTER — Encounter: Payer: Self-pay | Admitting: Family Medicine

## 2019-05-29 DIAGNOSIS — M47816 Spondylosis without myelopathy or radiculopathy, lumbar region: Secondary | ICD-10-CM | POA: Diagnosis not present

## 2019-06-15 ENCOUNTER — Other Ambulatory Visit: Payer: Self-pay | Admitting: Family

## 2019-06-15 MED FILL — TOPIRAMATE 50 MG TABLET: 50 | 30 days supply | Qty: 30 | Fill #3

## 2019-06-15 MED FILL — LOSARTAN-HCTZ 100-25 MG TAB: 100-25 | 30 days supply | Qty: 30 | Fill #1

## 2019-06-15 MED FILL — FARXIGA 10 MG TABLET: 10 | 30 days supply | Qty: 30 | Fill #1

## 2019-06-16 MED FILL — POTASSIUM CHLORIDE CRYS ER: 20 | 30 days supply | Qty: 30 | Fill #0

## 2019-06-18 ENCOUNTER — Ambulatory Visit: Payer: 59 | Admitting: Dietician

## 2019-07-27 ENCOUNTER — Other Ambulatory Visit: Payer: Self-pay | Admitting: Family

## 2019-07-27 ENCOUNTER — Other Ambulatory Visit: Payer: Self-pay | Admitting: Family Medicine

## 2019-07-27 DIAGNOSIS — E119 Type 2 diabetes mellitus without complications: Secondary | ICD-10-CM

## 2019-07-27 MED FILL — FARXIGA 10 MG TABLET: 10 | 30 days supply | Qty: 30 | Fill #2

## 2019-07-27 MED FILL — AMLODIPINE BESYLATE 5 MG TA: 5 | 90 days supply | Qty: 90 | Fill #1

## 2019-07-27 MED FILL — CARVEDILOL 25 MG TABLET: 25 | 90 days supply | Qty: 180 | Fill #1

## 2019-07-27 MED FILL — POTASSIUM CHLORIDE CRYS ER: 20 | 90 days supply | Qty: 90 | Fill #0

## 2019-07-27 MED FILL — ATORVASTATIN 10 MG TABLET: 10 | 90 days supply | Qty: 90 | Fill #0

## 2019-07-27 MED FILL — TOPIRAMATE 50 MG TABLET: 50 | 60 days supply | Qty: 60 | Fill #4

## 2019-07-28 MED FILL — metFORMIN HCL 500 MG TABS: 500 | 90 days supply | Qty: 180 | Fill #0

## 2019-07-28 MED FILL — LOSARTAN-HCTZ 100-25 MG TAB: 100-25 | 30 days supply | Qty: 30 | Fill #0

## 2019-07-30 ENCOUNTER — Encounter: Payer: 59 | Attending: Family Medicine | Admitting: Dietician

## 2019-09-04 MED FILL — FARXIGA 10 MG TABLET: 10 | 30 days supply | Qty: 30 | Fill #3

## 2019-09-04 MED FILL — LOSARTAN-HCTZ 100-25 MG TAB: 100-25 | 30 days supply | Qty: 30 | Fill #1

## 2019-09-24 ENCOUNTER — Other Ambulatory Visit: Payer: Self-pay | Admitting: Family Medicine

## 2019-09-25 ENCOUNTER — Other Ambulatory Visit: Payer: Self-pay | Admitting: Family Medicine

## 2019-09-25 MED FILL — TOPIRAMATE 50 MG TABLET: 50 | 60 days supply | Qty: 60 | Fill #0

## 2019-10-06 MED FILL — FARXIGA 10 MG TABLET: 10 | 30 days supply | Qty: 30 | Fill #4

## 2019-10-06 MED FILL — LOSARTAN-HCTZ 100-25 MG TAB: 100-25 | 30 days supply | Qty: 30 | Fill #2

## 2019-11-09 MED FILL — FARXIGA 10 MG TABLET: 10 | 30 days supply | Qty: 30 | Fill #5

## 2019-11-09 MED FILL — METFORMIN HCL 500 MG TABS: 500 | 90 days supply | Qty: 180 | Fill #1

## 2019-11-09 MED FILL — POTASSIUM CHLORIDE CRYS ER: 20 | 90 days supply | Qty: 90 | Fill #1

## 2019-11-09 MED FILL — LOSARTAN-HCTZ 100-25 MG TAB: 100-25 | 90 days supply | Qty: 90 | Fill #3

## 2019-11-23 ENCOUNTER — Other Ambulatory Visit: Payer: Self-pay | Admitting: Family Medicine

## 2019-11-24 ENCOUNTER — Other Ambulatory Visit: Payer: Self-pay | Admitting: Family Medicine

## 2019-12-02 ENCOUNTER — Other Ambulatory Visit: Payer: Self-pay

## 2019-12-02 ENCOUNTER — Ambulatory Visit (INDEPENDENT_AMBULATORY_CARE_PROVIDER_SITE_OTHER): Payer: 59 | Admitting: Medical

## 2019-12-02 VITALS — BP 130/78 | HR 69 | Temp 97.3°F | Resp 18 | Ht 62.0 in | Wt 177.0 lb

## 2019-12-02 DIAGNOSIS — Z0000 Encounter for general adult medical examination without abnormal findings: Secondary | ICD-10-CM

## 2019-12-02 DIAGNOSIS — E119 Type 2 diabetes mellitus without complications: Secondary | ICD-10-CM

## 2019-12-02 DIAGNOSIS — R5383 Other fatigue: Secondary | ICD-10-CM | POA: Diagnosis not present

## 2019-12-02 NOTE — Progress Notes (Signed)
Subjective:    Patient ID: Monique Wiggins, female    DOB: Aug 26, 1965, 54 y.o.   MRN: [id number]  HPI  Pt in for wellness exam/cpe.  Pt is not fasting.   Pt has lost weight. She lost from 193 lb down to 177 lb. She has been eating a lot healthier. Almost cut out carbs completely. No soda. No alcohol. Non smoker. Weight loss over past 6 month. Pt states her lab results in December were major motivator.  Has diabetes and is on metformin regular version. Pt will discuss concerns about ER versoin recall and make sure recall not on standard version.  Pt got covid vaccine.   Up to date on pap.  Pt has standing mammogram order and will get scheduled.  Last colonoscopy in 2018.  Has diabetic eye exam within the month.      Review of Systems  Constitutional: Positive for fatigue. Negative for chills.  HENT: Negative for dental problem.   Respiratory: Negative for cough, chest tightness, shortness of breath and wheezing.   Cardiovascular: Negative for chest pain and palpitations.  Gastrointestinal: Negative for abdominal pain.  Musculoskeletal: Negative for back pain.  Neurological: Negative for dizziness, seizures and headaches.  Hematological: Negative for adenopathy. Does not bruise/bleed easily.  Psychiatric/Behavioral: Negative for behavioral problems and decreased concentration.    Past Medical History:  Diagnosis Date  . Allergy   . Arthritis   . Back pain   . Carpal tunnel syndrome   . Diabetes mellitus without complication ([place])   . DVT (deep venous thrombosis) ([place])    after long car trip  . Dyspnea   . GERD (gastroesophageal reflux disease)   . H. pylori infection    1997  . Heart murmur   . Hypertension   . Hypertriglyceridemia 02/22/2017  . Hypertriglyceridemia 02/22/2017  . Migraines   . Sickle cell trait ([place])   . Sleep apnea    states she no longer has OSA lost weight     Social History   Socioeconomic History  . Marital status: Married     Spouse name: Trilby Drummer  . Number of children: Not on file  . Years of education: Not on file  . Highest education level: Not on file  Occupational History  . Occupation: Patient [employer]  Tobacco Use  . Smoking status: Never Smoker  . Smokeless tobacco: Never Used  Substance and Sexual Activity  . Alcohol use: No    Comment: never  . Drug use: No  . Sexual activity: Yes    Birth control/protection: Surgical  Other Topics Concern  . 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 Difficulty of Paying Living Expenses:   Food Insecurity:   . Worried About Charity fundraiser in the Last Year:   . Arboriculturist in the Last Year:   Transportation Needs:   . Film/video editor (Medical):   Marland Kitchen Lack of Transportation (Non-Medical):  Physical Activity:   . Days of Exercise per Week:   . Minutes of Exercise per Session:   Stress:   . Feeling of Stress :   Social Connections:   . Frequency of Communication with Friends and Family:   . Frequency of Social Gatherings with Friends and Family:   . Attends Religious Services:   . Active Member of Clubs or Organizations:   . Attends Archivist Meetings:   Marland Kitchen Marital Status:   Intimate Partner Violence:   . Fear of Current or Ex-Partner:   . Emotionally Abused:   Marland Kitchen Physically Abused:   . Sexually Abused:     Past Surgical History:  Procedure Laterality Date  . ABDOMINAL HYSTERECTOMY     ovaries left in place  . CARPAL TUNNEL RELEASE Right 01/19/2019   Procedure: ENDOSCOPIC CARPAL TUNNEL  RELEASE;  Surgeon: Milly Jakob, MD;  Location: [facility];  Service: Orthopedics;  Laterality: Right;  . CARPOMETACARPEL SUSPENSION PLASTY Right 01/19/2019   Procedure: RIGHT THUMB SUSPENSION ARTHROPLASTY;  Surgeon: Milly Jakob, MD;  Location: [facility];  Service: Orthopedics;  Laterality: Right;  PREOP BLOCK  . TOE SURGERY     left great toe, repair of severed tendon  . TONSILLECTOMY    . tummy tuck      Family History  Problem Relation Age of Onset  . Diabetes type II Mother   . Hypertension Mother   . Diabetes Mother   . Diabetes Mellitus II Father   . Pulmonary fibrosis Father   . Diabetes Father   . Hypertension Father   . Pulmonary disease Father   . High Cholesterol Father   . Hypertension Sister   . Diabetes Sister   . Cancer Maternal Grandmother        colon  . Allergic Disorder Son   . Cancer Maternal Grandfather        lung, smoker  . Allergic Disorder Son   . Asthma Son   . Colon cancer Neg Hx   . Esophageal cancer Neg Hx   . Pancreatic cancer Neg Hx   . Stomach cancer Neg Hx     Allergies  Allergen Reactions  . Pollen Extract Swelling  . Shellfish Allergy Swelling    Current Outpatient Medications on File Prior to Visit  Medication Sig Dispense Refill  . acetaminophen (TYLENOL) 325 MG tablet Take 2 tablets (650 mg total) by mouth every 6 (six) hours.    Marland Kitchen albuterol (VENTOLIN HFA) 108 (90 Base) MCG/ACT inhaler Inhale 2 puffs into the lungs every 6 (six) hours as needed for wheezing or shortness of breath. 1 Inhaler 2  . amLODipine (NORVASC) 5 MG tablet TAKE 1 TABLET (5 MG TOTAL) BY MOUTH DAILY. 90 tablet 3  . atorvastatin (LIPITOR) 10 MG tablet Take 1 tablet (10 mg total) by mouth daily. 90 tablet 1  . carvedilol (COREG) 25 MG tablet Take 1 tablet (25 mg total) by mouth 2 (two) times daily with a meal. 180 tablet 1  . cetirizine (ZYRTEC) 10 MG tablet Take 1 tablet (10 mg total) by mouth daily. 30 tablet 11  . Continuous  Blood Gluc Sensor (FREESTYLE LIBRE 14 DAY SENSOR) MISC 1 each by Does not apply route every 14 (fourteen) days. 2 each 0  . dapagliflozin propanediol (FARXIGA) 10 MG TABS tablet Take 10 mg by mouth daily before breakfast. 30 tablet 6  . fluticasone (FLONASE) 50 MCG/ACT nasal spray Place 2 sprays into both nostrils daily. 16 g  1  . hydrochlorothiazide (HYDRODIURIL) 25 MG tablet Take 25 mg by mouth daily.    Marland Kitchen losartan-hydrochlorothiazide (HYZAAR) 100-25 MG tablet TAKE 1 TABLET BY MOUTH DAILY. 30 tablet 5  . meloxicam (MOBIC) 15 MG tablet Take 1 tablet (15 mg total) by mouth daily as needed for pain. (Patient not taking: Reported on 12/02/2019) 21 tablet 0  . metFORMIN (GLUCOPHAGE) 500 MG tablet TAKE 1 TABLET BY MOUTH TWICE DAILY WITH A MEAL 180 tablet 1  . mometasone (ELOCON) 0.1 % cream Apply 1 application topically daily. 45 g 2  . neomycin-polymyxin-hydrocortisone (CORTISPORIN) OTIC solution Apply 1-2 drops to toes twice a day until all gone 10 mL 0  . omeprazole (PRILOSEC) 40 MG capsule Take 1 capsule (40 mg total) by mouth daily. 30 capsule 1  . oxyCODONE (ROXICODONE) 5 MG immediate release tablet Take 1 tablet (5 mg total) by mouth every 6 (six) hours as needed for severe pain. (Patient not taking: Reported on 12/02/2019) 20 tablet 0  . potassium chloride SA (KLOR-CON) 20 MEQ tablet TAKE 1 TABLET (20 MEQ TOTAL) BY MOUTH DAILY. 30 tablet 5  . sertraline (ZOLOFT) 50 MG tablet Take 1 tablet (50 mg total) by mouth daily. 90 tablet 1  . topiramate (TOPAMAX) 50 MG tablet TAKE 1 TABLET (50 MG TOTAL) BY MOUTH DAILY. 60 tablet 5  . tretinoin (RETIN-A) 0.025 % cream APPLY TO THE AFFECTED AREA(S) AT BEDTIME 45 g 2   No current facility-administered medications on file prior to visit.    BP 130/78   Pulse 69   Temp (!) 97.3 F (36.3 C) (Temporal)   Resp 18   Ht 5\' 2"  (1.575 m)   Wt 177 lb (80.3 kg)   SpO2 98%   BMI 32.37 kg/m       Objective:   Physical Exam  General Mental Status- Alert.  General Appearance- Not in acute distress.   Skin General: Color- Normal Color. Moisture- Normal Moisture.  Neck Carotid Arteries- Normal color. Moisture- Normal Moisture. No carotid bruits. No JVD.  Chest and Lung Exam Auscultation: Breath Sounds:-Normal.  Cardiovascular Auscultation:Rythm- Regular. Murmurs & Other Heart Sounds:Auscultation of the heart reveals- No Murmurs.  Abdomen Inspection:-Inspeection Normal. Palpation/Percussion:Note:No mass. Palpation and Percussion of the abdomen reveal- Non Tender, Non Distended + BS, no rebound or guarding.    Neurologic Cranial Nerve exam:- CN III-XII intact(No nystagmus), symmetric smile. Strength:- 5/5 equal and symmetric strength both upper and lower extremities.      Assessment & Plan:  For you wellness exam today I have ordered cbc, cmp, tsh, lipid panel and a1c.   Vaccine up to date. Got covid series already.  Recommend exercise and healthy diet.  We will let you know lab results as they come in.  Follow up date appointment will be determined after lab review.   Mackie Pai, PA-C

## 2019-12-02 NOTE — Patient Instructions (Addendum)
For you wellness exam today I have ordered cbc, cmp, tsh, lipid panel and a1c.  Vaccine up to date. Got covid series already.  Recommend exercise and healthy diet.  We will let you know lab results as they come in.  Follow up date appointment will be determined after lab review.    Preventive Care 61-55 Years Old, Female Preventive care refers to visits with your health care provider and lifestyle choices that can promote health and wellness. This includes:  A yearly physical exam. This may also be called an annual well check.  Regular dental visits and eye exams.  Immunizations.  Screening for certain conditions.  Healthy lifestyle choices, such as eating a healthy diet, getting regular exercise, not using drugs or products that contain nicotine and tobacco, and limiting alcohol use. What can I expect for my preventive care visit? Physical exam Your health care provider will check your:  Height and weight. This may be used to calculate body mass index (BMI), which tells if you are at a healthy weight.  Heart rate and blood pressure.  Skin for abnormal spots. Counseling Your health care provider may ask you questions about your:  Alcohol, tobacco, and drug use.  Emotional well-being.  Home and relationship well-being.  Sexual activity.  Eating habits.  Work and work Statistician.  Method of birth control.  Menstrual cycle.  Pregnancy history. What immunizations do I need?  Influenza (flu) vaccine  This is recommended every year. Tetanus, diphtheria, and pertussis (Tdap) vaccine  You may need a Td booster every 10 years. Varicella (chickenpox) vaccine  You may need this if you have not been vaccinated. Zoster (shingles) vaccine  You may need this after age 70. Measles, mumps, and rubella (MMR) vaccine  You may need at least one dose of MMR if you were born in 1957 or later. You may also need a second dose. Pneumococcal conjugate (PCV13)  vaccine  You may need this if you have certain conditions and were not previously vaccinated. Pneumococcal polysaccharide (PPSV23) vaccine  You may need one or two doses if you smoke cigarettes or if you have certain conditions. Meningococcal conjugate (MenACWY) vaccine  You may need this if you have certain conditions. Hepatitis A vaccine  You may need this if you have certain conditions or if you travel or work in places where you may be exposed to hepatitis A. Hepatitis B vaccine  You may need this if you have certain conditions or if you travel or work in places where you may be exposed to hepatitis B. Haemophilus influenzae type b (Hib) vaccine  You may need this if you have certain conditions. Human papillomavirus (HPV) vaccine  If recommended by your health care provider, you may need three doses over 6 months. You may receive vaccines as individual doses or as more than one vaccine together in one shot (combination vaccines). Talk with your health care provider about the risks and benefits of combination vaccines. What tests do I need? Blood tests  Lipid and cholesterol levels. These may be checked every 5 years, or more frequently if you are over 32 years old.  Hepatitis C test.  Hepatitis B test. Screening  Lung cancer screening. You may have this screening every year starting at age 55 if you have a 30-pack-year history of smoking and currently smoke or have quit within the past 15 years.  Colorectal cancer screening. All adults should have this screening starting at age 23 and continuing until age 32. Your health  care provider may recommend screening at age 31 if you are at increased risk. You will have tests every 1-10 years, depending on your results and the type of screening test.  Diabetes screening. This is done by checking your blood sugar (glucose) after you have not eaten for a while (fasting). You may have this done every 1-3 years.  Mammogram. This may be  done every 1-2 years. Talk with your health care provider about when you should start having regular mammograms. This may depend on whether you have a family history of breast cancer.  BRCA-related cancer screening. This may be done if you have a family history of breast, ovarian, tubal, or peritoneal cancers.  Pelvic exam and Pap test. This may be done every 3 years starting at age 23. Starting at age 48, this may be done every 5 years if you have a Pap test in combination with an HPV test. Other tests  Sexually transmitted disease (STD) testing.  Bone density scan. This is done to screen for osteoporosis. You may have this scan if you are at high risk for osteoporosis. Follow these instructions at home: Eating and drinking  Eat a diet that includes fresh fruits and vegetables, whole grains, lean protein, and low-fat dairy.  Take vitamin and mineral supplements as recommended by your health care provider.  Do not drink alcohol if: ? Your health care provider tells you not to drink. ? You are pregnant, may be pregnant, or are planning to become pregnant.  If you drink alcohol: ? Limit how much you have to 0-1 drink a day. ? Be aware of how much alcohol is in your drink. In the [place] one drink equals one 12 oz bottle of beer (355 mL), one 5 oz glass of wine (148 mL), or one 1 oz glass of hard liquor (44 mL). Lifestyle  Take daily care of your teeth and gums.  Stay active. Exercise for at least 30 minutes on 5 or more days each week.  Do not use any products that contain nicotine or tobacco, such as cigarettes, e-cigarettes, and chewing tobacco. If you need help quitting, ask your health care provider.  If you are sexually active, practice safe sex. Use a condom or other form of birth control (contraception) in order to prevent pregnancy and STIs (sexually transmitted infections).  If told by your health care provider, take low-dose aspirin daily starting at age 57. What's  next?  Visit your health care provider once a year for a well check visit.  Ask your health care provider how often you should have your eyes and teeth checked.  Stay up to date on all vaccines. This information is not intended to replace advice given to you by your health care provider. Make sure you discuss any questions you have with your health care provider. Document Revised: 02/13/2018 Document Reviewed: 02/13/2018 Elsevier Patient Education  2020 [employer].

## 2019-12-03 ENCOUNTER — Other Ambulatory Visit (INDEPENDENT_AMBULATORY_CARE_PROVIDER_SITE_OTHER): Payer: 59

## 2019-12-03 DIAGNOSIS — E119 Type 2 diabetes mellitus without complications: Secondary | ICD-10-CM | POA: Diagnosis not present

## 2019-12-03 DIAGNOSIS — Z0000 Encounter for general adult medical examination without abnormal findings: Secondary | ICD-10-CM

## 2019-12-03 DIAGNOSIS — R5383 Other fatigue: Secondary | ICD-10-CM

## 2019-12-03 LAB — CBC WITH DIFFERENTIAL/PLATELET
Basophils Absolute: 0.1 10*3/uL (ref 0.0–0.1)
Basophils Relative: 1 % (ref 0.0–3.0)
Eosinophils Absolute: 0.2 10*3/uL (ref 0.0–0.7)
Eosinophils Relative: 1.7 % (ref 0.0–5.0)
HCT: 45.3 % (ref 36.0–46.0)
Hemoglobin: 14.8 g/dL (ref 12.0–15.0)
Lymphocytes Relative: 24.7 % (ref 12.0–46.0)
Lymphs Abs: 2.3 10*3/uL (ref 0.7–4.0)
MCHC: 32.7 g/dL (ref 30.0–36.0)
MCV: 81.3 fl (ref 78.0–100.0)
Monocytes Absolute: 0.5 10*3/uL (ref 0.1–1.0)
Monocytes Relative: 5.8 % (ref 3.0–12.0)
Neutro Abs: 6.2 10*3/uL (ref 1.4–7.7)
Neutrophils Relative %: 66.8 % (ref 43.0–77.0)
Platelets: 331 10*3/uL (ref 150.0–400.0)
RBC: 5.57 Mil/uL — ABNORMAL HIGH (ref 3.87–5.11)
RDW: 14.8 % (ref 11.5–15.5)
WBC: 9.3 10*3/uL (ref 4.0–10.5)

## 2019-12-03 LAB — COMPREHENSIVE METABOLIC PANEL
ALT: 36 U/L — ABNORMAL HIGH (ref 0–35)
AST: 19 U/L (ref 0–37)
Albumin: 4.9 g/dL (ref 3.5–5.2)
Alkaline Phosphatase: 96 U/L (ref 39–117)
BUN: 27 mg/dL — ABNORMAL HIGH (ref 6–23)
CO2: 26 mEq/L (ref 19–32)
Calcium: 9.8 mg/dL (ref 8.4–10.5)
Chloride: 102 mEq/L (ref 96–112)
Creatinine, Ser: 0.95 mg/dL (ref 0.40–1.20)
GFR: 73.86 mL/min (ref >60.00)
Glucose, Bld: 101 mg/dL — ABNORMAL HIGH (ref 70–99)
Potassium: 3.8 mEq/L (ref 3.5–5.1)
Sodium: 137 mEq/L (ref 135–145)
Total Bilirubin: 0.5 mg/dL (ref 0.2–1.2)
Total Protein: 7.9 g/dL (ref 6.0–8.3)

## 2019-12-03 LAB — LIPID PANEL
Cholesterol: 105 mg/dL (ref 0–200)
HDL: 49 mg/dL (ref >39.00)
LDL Cholesterol: 25 mg/dL (ref 0–99)
NonHDL: 56.02
Total CHOL/HDL Ratio: 2
Triglycerides: 153 mg/dL — ABNORMAL HIGH (ref 0.0–149.0)
VLDL: 30.6 mg/dL (ref 0.0–40.0)

## 2019-12-03 LAB — HEMOGLOBIN A1C: Hgb A1c MFr Bld: 6.9 % — ABNORMAL HIGH (ref 4.6–6.5)

## 2019-12-03 LAB — TSH: TSH: 1.52 u[IU]/mL (ref 0.35–4.50)

## 2019-12-04 ENCOUNTER — Encounter: Payer: Self-pay | Admitting: Medical

## 2019-12-10 MED FILL — FARXIGA 10 MG TABLET: 10 | 30 days supply | Qty: 30 | Fill #6

## 2019-12-31 ENCOUNTER — Other Ambulatory Visit: Payer: Self-pay | Admitting: Family Medicine

## 2019-12-31 MED FILL — ATORVASTATIN CALCIUM 10 MG: 10 | 90 days supply | Qty: 90 | Fill #0

## 2020-01-25 DIAGNOSIS — Z03818 Encounter for observation for suspected exposure to other biological agents ruled out: Secondary | ICD-10-CM | POA: Diagnosis not present

## 2020-01-25 DIAGNOSIS — Z20822 Contact with and (suspected) exposure to covid-19: Secondary | ICD-10-CM | POA: Diagnosis not present

## 2020-01-26 ENCOUNTER — Other Ambulatory Visit: Payer: Self-pay | Admitting: Family Medicine

## 2020-01-26 DIAGNOSIS — E119 Type 2 diabetes mellitus without complications: Secondary | ICD-10-CM

## 2020-01-26 MED FILL — FARXIGA 10 MG TABLET: 10 | 30 days supply | Qty: 30 | Fill #0

## 2020-02-02 MED FILL — TOPIRAMATE 50 MG TABLET: 50 | 60 days supply | Qty: 60 | Fill #2

## 2020-02-26 ENCOUNTER — Other Ambulatory Visit (HOSPITAL_COMMUNITY): Payer: Self-pay | Admitting: Family Medicine

## 2020-02-26 ENCOUNTER — Other Ambulatory Visit: Payer: Self-pay | Admitting: Family Medicine

## 2020-02-26 MED FILL — LOSARTAN-HCTZ 100-25 MG TAB: 100-25 | 90 days supply | Qty: 90 | Fill #0

## 2020-03-08 MED FILL — FARXIGA 10 MG TABLET: 10 | 30 days supply | Qty: 30 | Fill #1

## 2020-03-16 ENCOUNTER — Ambulatory Visit (HOSPITAL_BASED_OUTPATIENT_CLINIC_OR_DEPARTMENT_OTHER): Payer: 59

## 2020-03-21 ENCOUNTER — Ambulatory Visit (INDEPENDENT_AMBULATORY_CARE_PROVIDER_SITE_OTHER): Payer: 59 | Admitting: Family Medicine

## 2020-03-21 ENCOUNTER — Other Ambulatory Visit: Payer: Self-pay

## 2020-03-21 ENCOUNTER — Encounter: Payer: Self-pay | Admitting: Family Medicine

## 2020-03-21 VITALS — BP 128/80 | HR 69 | Temp 98.5°F | Ht 61.0 in | Wt 185.1 lb

## 2020-03-21 DIAGNOSIS — M25551 Pain in right hip: Secondary | ICD-10-CM

## 2020-03-21 DIAGNOSIS — R110 Nausea: Secondary | ICD-10-CM

## 2020-03-21 DIAGNOSIS — M25511 Pain in right shoulder: Secondary | ICD-10-CM

## 2020-03-21 DIAGNOSIS — M7671 Peroneal tendinitis, right leg: Secondary | ICD-10-CM

## 2020-03-21 DIAGNOSIS — M545 Low back pain, unspecified: Secondary | ICD-10-CM

## 2020-03-21 MED ORDER — ONDANSETRON 4 MG PO TBDP: 4.0000 mg | ORAL_TABLET | Freq: Three times a day (TID) | ORAL | 0 refills | Status: DC | PRN

## 2020-03-21 MED ORDER — CYCLOBENZAPRINE HCL 10 MG PO TABS: 5.0000 mg | ORAL_TABLET | Freq: Three times a day (TID) | ORAL | 0 refills | Status: DC | PRN

## 2020-03-21 MED ORDER — MELOXICAM 15 MG PO TABS: 15.0000 mg | ORAL_TABLET | Freq: Every day | ORAL | 0 refills | Status: DC

## 2020-03-21 NOTE — Patient Instructions (Addendum)
OK to take Tylenol 1000 mg (2 extra strength tabs) or 975 mg (3 regular strength tabs) every 6 hours as needed.  Ice/cold pack over area for 10-15 min twice daily.  Heat (pad or rice pillow in microwave) over affected area, 10-15 minutes twice daily.   Take Flexeril (cyclobenzaprine) 1-2 hours before planned bedtime. If it makes you drowsy, do not take during the day. You can try half a tab the following night.  EXERCISES  RANGE OF MOTION (ROM) AND STRETCHING EXERCISES These exercises may help you when beginning to rehabilitate your injury. While completing these exercises, remember:   Restoring tissue flexibility helps normal motion to return to the joints. This allows healthier, less painful movement and activity.  An effective stretch should be held for at least 30 seconds.   A stretch should never be painful. You should only feel a gentle lengthening or release in the stretched tissue.  ROM - Pendulum  Bend at the waist so that your right / left arm falls away from your body. Support yourself with your opposite hand on a solid surface, such as a table or a countertop.  Your right / left arm should be perpendicular to the ground. If it is not perpendicular, you need to lean over farther. Relax the muscles in your right / left arm and shoulder as much as possible.  Gently sway your hips and trunk so they move your right / left arm without any use of your right / left shoulder muscles.  Progress your movements so that your right / left arm moves side to side, then forward and backward, and finally, both clockwise and counterclockwise.  Complete 10-15 repetitions in each direction. Many people use this exercise to relieve discomfort in their shoulder as well as to gain range of motion. Repeat 2 times. Complete this exercise 3 times per week.  STRETCH - Flexion, Standing  Stand with good posture. With an underhand grip on your right / left hand and an overhand grip on the opposite  hand, grasp a broomstick or cane so that your hands are a little more than shoulder-width apart.  Keeping your right / left elbow straight and shoulder muscles relaxed, push the stick with your opposite hand to raise your right / left arm in front of your body and then overhead. Raise your arm until you feel a stretch in your right / left shoulder, but before you have increased shoulder pain.  Try to avoid shrugging your right / left shoulder as your arm rises by keeping your shoulder blade tucked down and toward your mid-back spine. Hold 30 seconds.  Slowly return to the starting position. Repeat 2 times. Complete this exercise 3 times per week.  STRETCH - Internal Rotation  Place your right / left hand behind your back, palm-up.  Throw a towel or belt over your opposite shoulder. Grasp the towel/belt with your right / left hand.  While keeping an upright posture, gently pull up on the towel/belt until you feel a stretch in the front of your right / left shoulder.  Avoid shrugging your right / left shoulder as your arm rises by keeping your shoulder blade tucked down and toward your mid-back spine.  Hold 30. Release the stretch by lowering your opposite hand. Repeat 2 times. Complete this exercise 3 times per week.  STRETCH - External Rotation and Abduction  Stagger your stance through a doorframe. It does not matter which foot is forward.  As instructed by your physician, physical  therapist or athletic trainer, place your hands: ? And forearms above your head and on the door frame. ? And forearms at head-height and on the door frame. ? At elbow-height and on the door frame.  Keeping your head and chest upright and your stomach muscles tight to prevent over-extending your low-back, slowly shift your weight onto your front foot until you feel a stretch across your chest and/or in the front of your shoulders.  Hold 30 seconds. Shift your weight to your back foot to release the  stretch. Repeat 2 times. Complete this stretch 3 times per week.   STRENGTHENING EXERCISES  These exercises may help you when beginning to rehabilitate your injury. They may resolve your symptoms with or without further involvement from your physician, physical therapist or athletic trainer. While completing these exercises, remember:   Muscles can gain both the endurance and the strength needed for everyday activities through controlled exercises.  Complete these exercises as instructed by your physician, physical therapist or athletic trainer. Progress the resistance and repetitions only as guided.  You may experience muscle soreness or fatigue, but the pain or discomfort you are trying to eliminate should never worsen during these exercises. If this pain does worsen, stop and make certain you are following the directions exactly. If the pain is still present after adjustments, discontinue the exercise until you can discuss the trouble with your clinician.  If advised by your physician, during your recovery, avoid activity or exercises which involve actions that place your right / left hand or elbow above your head or behind your back or head. These positions stress the tissues which are trying to heal.  STRENGTH - Scapular Depression and Adduction  With good posture, sit on a firm chair. Supported your arms in front of you with pillows, arm rests or a table top. Have your elbows in line with the sides of your body.  Gently draw your shoulder blades down and toward your mid-back spine. Gradually increase the tension without tensing the muscles along the top of your shoulders and the back of your neck.  Hold for 3 seconds. Slowly release the tension and relax your muscles completely before completing the next repetition.  After you have practiced this exercise, remove the arm support and complete it in standing as well as sitting. Repeat 2 times. Complete this exercise 3 times per week.    STRENGTH - External Rotators  Secure a rubber exercise band/tubing to a fixed object so that it is at the same height as your right / left elbow when you are standing or sitting on a firm surface.  Stand or sit so that the secured exercise band/tubing is at your side that is not injured.  Bend your elbow 90 degrees. Place a folded towel or small pillow under your right / left arm so that your elbow is a few inches away from your side.  Keeping the tension on the exercise band/tubing, pull it away from your body, as if pivoting on your elbow. Be sure to keep your body steady so that the movement is only coming from your shoulder rotating.  Hold 3 seconds. Release the tension in a controlled manner as you return to the starting position. Repeat 2 times. Complete this exercise 3 times per week.   STRENGTH - Supraspinatus  Stand or sit with good posture. Grasp a 2-3 lb weight or an exercise band/tubing so that your hand is "thumbs-up," like when you shake hands.  Slowly lift your right /  left hand from your thigh into the air, traveling about 30 degrees from straight out at your side. Lift your hand to shoulder height or as far as you can without increasing any shoulder pain. Initially, many people do not lift their hands above shoulder height.  Avoid shrugging your right / left shoulder as your arm rises by keeping your shoulder blade tucked down and toward your mid-back spine.  Hold for 3 seconds. Control the descent of your hand as you slowly return to your starting position. Repeat 2 times. Complete this exercise 3 times per week.   STRENGTH - Shoulder Extensors  Secure a rubber exercise band/tubing so that it is at the height of your shoulders when you are either standing or sitting on a firm arm-less chair.  With a thumbs-up grip, grasp an end of the band/tubing in each hand. Straighten your elbows and lift your hands straight in front of you at shoulder height. Step back away from  the secured end of band/tubing until it becomes tense.  Squeezing your shoulder blades together, pull your hands down to the sides of your thighs. Do not allow your hands to go behind you.  Hold for 3 seconds. Slowly ease the tension on the band/tubing as you reverse the directions and return to the starting position. Repeat 2 times. Complete this exercise 3 times per week.   STRENGTH - Scapular Retractors  Secure a rubber exercise band/tubing so that it is at the height of your shoulders when you are either standing or sitting on a firm arm-less chair.  With a palm-down grip, grasp an end of the band/tubing in each hand. Straighten your elbows and lift your hands straight in front of you at shoulder height. Step back away from the secured end of band/tubing until it becomes tense.  Squeezing your shoulder blades together, draw your elbows back as you bend them. Keep your upper arm lifted away from your body throughout the exercise.  Hold 3 seconds. Slowly ease the tension on the band/tubing as you reverse the directions and return to the starting position. Repeat 2 times. Complete this exercise 3 times per week.  STRENGTH - Scapular Depressors  Find a sturdy chair without wheels, such as a from a dining room table.  Keeping your feet on the floor, lift your bottom from the seat and lock your elbows.  Keeping your elbows straight, allow gravity to pull your body weight down. Your shoulders will rise toward your ears.  Raise your body against gravity by drawing your shoulder blades down your back, shortening the distance between your shoulders and ears. Although your feet should always maintain contact with the floor, your feet should progressively support less body weight as you get stronger.  Hold 3 seconds. In a controlled and slow manner, lower your body weight to begin the next repetition. Repeat 2 times. Complete this exercise 3 times per week.   This information is not intended  to replace advice given to you by your health care provider. Make sure you discuss any questions you have with your health care provider.  Document Released: 04/18/2005 Document Revised: 06/25/2014 Document Reviewed: 09/16/2008 Elsevier Interactive Patient Education 2016 [employer]   Ankle Exercises It is normal to feel mild stretching, pulling, tightness, or discomfort as you do these exercises, but you should stop right away if you feel sudden pain or your pain gets worse.  Stretching and range of motion exercises These exercises warm up your muscles and joints and improve  the movement and flexibility of your ankle. These exercises also help to relieve pain, numbness, and tingling. Exercise A: Dorsiflexion/Plantar Flexion   1. Sit with your affected knee straight or bent. Do not rest your foot on anything. 2. Flex your affected ankle to tilt the top of your foot toward your shin. 3. Hold this position for 5 seconds. 4. Point your toes downward to tilt the top of your foot away from your shin. 5. Hold this position for 5 seconds. Repeat 2 times. Complete this exercise 3 times per week. Exercise B: Ankle Alphabet   1. Sit with your affected foot supported at your lower leg. ? Do not rest your foot on anything. ? Make sure your foot has room to move freely. 2. Think of your affected foot as a paintbrush, and move your foot to trace each letter of the alphabet in the air. Keep your hip and knee still while you trace. Make the letters as large as you can without increasing any discomfort. 3. Trace every letter from A to Z. Repeat 2 times. Complete this exercise 3 times per week. Strengthening exercises These exercises build strength and endurance in your ankle. Endurance is the ability to use your muscles for a long time, even after they get tired. Exercise D: Dorsiflexors   1. Secure a rubber exercise band or tube to an object, such as a table leg, that will stay still when the  band is pulled. Secure the other end around your affected foot. 2. Sit on the floor, facing the object with your affected leg extended. The band or tube should be slightly tense when your foot is relaxed. 3. Slowly flex your affected ankle and toes to bring your foot toward you. 4. Hold this position for 3 seconds.  5. Slowly return your foot to the starting position, controlling the band as you do that. Do a total of 10 repetitions. Repeat 2 times. Complete this exercise 3 times per week. Exercise E: Plantar Flexors   1. Sit on the floor with your affected leg extended. 2. Loop a rubber exercise band or tube around the ball of your affected foot. The ball of your foot is on the walking surface, right under your toes. The band or tube should be slightly tense when your foot is relaxed. 3. Slowly point your toes downward, pushing them away from you. 4. Hold this position for 3 seconds. 5. Slowly release the tension in the band or tube, controlling smoothly until your foot is back in the starting position. Repeat for a total of 10 repetitions. Repeat 2 times. Complete this exercise 3 times per week. Exercise F: Towel Curls   1. Sit in a chair on a non-carpeted surface, and put your feet on the floor. 2. Place a towel in front of your feet.  3. Keeping your heel on the floor, put your affected foot on the towel. 4. Pull the towel toward you by grabbing the towel with your toes and curling them under. Keep your heel on the floor. 5. Let your toes relax. 6. Grab the towel again. Keep going until the towel is completely underneath your foot. Repeat for a total of 10 repetitions. Repeat 2 times. Complete this exercise 3 times per week. Exercise G: Heel Raise ( Plantar Flexors, Standing)    1. Stand with your feet shoulder-width apart. 2. Keep your weight spread evenly over the width of your feet while you rise up on your toes. Use a wall or table  to steady yourself, but try not to use it  for support. 3. If this exercise is too easy, try these options: ? Shift your weight toward your affected leg until you feel challenged. ? If told by your health care provider, lift your uninjured leg off the floor. 4. Hold this position for 3 seconds. Repeat for a total of 10 repetitions. Repeat 2 times. Complete this exercise 3 times per week. Exercise H: Tandem Walking 1. Stand with one foot directly in front of the other. 2. Slowly raise your back foot up, lifting your heel before your toes, and place it directly in front of your other foot. 3. Continue to walk in this heel-to-toe way for 10 steps or for as long as told by your health care provider. Have a countertop or wall nearby to use if needed to keep your balance, but try not to hold onto anything for support. Repeat 2 times. Complete this exercises 3 times per week. Make sure you discuss any questions you have with your health care provider. Document Released: 04/18/2005 Document Revised: 02/02/2016 Document Reviewed: 02/20/2015 Elsevier Interactive Patient Education  2018 [employer].

## 2020-03-21 NOTE — Progress Notes (Signed)
Musculoskeletal Exam  Patient: Monique Wiggins DOB: 09-27-1965  DOS: 03/21/2020  SUBJECTIVE:  Chief Complaint:   Chief Complaint  Patient presents with  . Fall    fell down steps last night.    . Shoulder Pain    right    Monique Wiggins is a 54 y.o.  female for evaluation and treatment of R shoulder pain.   Onset:  1 day ago. No inj or change in activity.  Location: Entire shoulder Character:  aching  Progression of issue:  is unchanged Associated symptoms:Decreased ROM Treatment: to date has been OTC NSAIDS, heat, ice   Neurovascular symptoms: no  Past Medical History:  Diagnosis Date  . Allergy   . Arthritis   . Back pain   . Carpal tunnel syndrome   . Diabetes mellitus without complication ([place])   . DVT (deep venous thrombosis) ([place])    after long car trip  . Dyspnea   . GERD (gastroesophageal reflux disease)   . H. pylori infection    1997  . Heart murmur   . Hypertension   . Hypertriglyceridemia 02/22/2017  . Hypertriglyceridemia 02/22/2017  . Migraines   . Sickle cell trait ([place])   . Sleep apnea    states she no longer has OSA lost weight    Objective: VITAL SIGNS: BP 128/80 (BP Location: Left Arm, Patient Position: Sitting, Cuff Size: Normal)   Pulse 69   Temp 98.5 F (36.9 C) (Oral)   Ht 5\' 1"  (1.549 m)   Wt 185 lb 2 oz (84 kg)   SpO2 98%   BMI 34.98 kg/m  Constitutional: Well formed, well developed. No acute distress. Thorax & Lungs: No accessory muscle use Musculoskeletal: R shoulder.   Normal active range of motion: no.   Normal passive range of motion: no Tenderness to palpation: yes; over the biceps tendon and lateral deltoid; there is also tenderness over the distal third of the peroneus muscle group on the right, right external hip, and right posterior oblique at approximately T10 Deformity: no Ecchymosis: no Most of her tests (Neer's, Hawkins, crossover, speeds, liftoff) are equivocal due to pain/decreased ROM; when her  supraspinatus was stressed, she did have pain, some strength did persist Neurologic: Normal sensory function. No focal deficits noted. DTR's equal and symmetric in LE's. No clonus. Psychiatric: Normal mood. Age appropriate judgment and insight. Alert & oriented x 3.    Assessment:  Acute pain of right shoulder - Plan: Ambulatory referral to Sports Medicine, cyclobenzaprine (FLEXERIL) 10 MG tablet, meloxicam (MOBIC) 15 MG tablet  Peroneus longus tendonitis, right  Acute right-sided low back pain without sciatica  Acute pain of right hip  Nausea - Plan: ondansetron (ZOFRAN-ODT) 4 MG disintegrating tablet  Plan: Stretches/exercises for both shoulder and ankle provided, heat, ice, Tylenol.  Warning about muscle relaxant verbalized and written down.  Zofran for nausea, she may have sustained a mild concussion.  I want her to see the sports medicine team as a contingency as her exam is very unrevealing due to pain today.  She may need an ultrasound.  She has no bony tenderness today so doubt x-ray will be warranted. F/u prn. The patient voiced understanding and agreement to the plan.   [place], DO 03/21/20  4:45 PM

## 2020-03-25 ENCOUNTER — Encounter: Payer: Self-pay | Admitting: Family Medicine

## 2020-03-25 ENCOUNTER — Ambulatory Visit: Payer: 59 | Admitting: Family Medicine

## 2020-03-25 ENCOUNTER — Other Ambulatory Visit: Payer: Self-pay

## 2020-03-25 VITALS — BP 134/89 | HR 72 | Ht 61.0 in | Wt 175.0 lb

## 2020-03-25 DIAGNOSIS — M778 Other enthesopathies, not elsewhere classified: Secondary | ICD-10-CM | POA: Diagnosis not present

## 2020-03-25 MED ORDER — PREDNISONE 5 MG PO TABS: ORAL_TABLET | ORAL | 0 refills | Status: DC

## 2020-03-25 NOTE — Progress Notes (Signed)
Monique Wiggins - 54 y.o. female MRN [id number]  Date of birth: 1965-11-20  SUBJECTIVE:  Including CC & ROS.  Chief Complaint  Patient presents with  . Shoulder Injury    right x 03-20-2020    Monique Wiggins is a 54 y.o. female that is presenting with right shoulder pain.  She had a fall earlier this week where she fell down stairs.  She slid down on her right side.  She had severe pain to where she is unable to move her shoulder at all.  Now she is able to move her shoulder closer to normal range of motion.  Denies any radicular symptoms.  Has been taking the meloxicam.  No numbness or tingling.   Review of Systems See HPI   HISTORY: Past Medical, Surgical, Social, and Family History Reviewed & Updated per EMR.   Pertinent Historical Findings include:  Past Medical History:  Diagnosis Date  . Allergy   . Arthritis   . Back pain   . Carpal tunnel syndrome   . Diabetes mellitus without complication ([place])   . DVT (deep venous thrombosis) ([place])    after long car trip  . Dyspnea   . GERD (gastroesophageal reflux disease)   . H. pylori infection    1997  . Heart murmur   . Hypertension   . Hypertriglyceridemia 02/22/2017  . Hypertriglyceridemia 02/22/2017  . Migraines   . Sickle cell trait ([place])   . Sleep apnea    states she no longer has OSA lost weight    Past Surgical History:  Procedure Laterality Date  . ABDOMINAL HYSTERECTOMY     ovaries left in place  . CARPAL TUNNEL RELEASE Right 01/19/2019   Procedure: ENDOSCOPIC CARPAL TUNNEL RELEASE;  Surgeon: Milly Jakob, MD;  Location: [facility];  Service: Orthopedics;  Laterality: Right;  . CARPOMETACARPEL SUSPENSION PLASTY Right 01/19/2019   Procedure: RIGHT THUMB SUSPENSION ARTHROPLASTY;  Surgeon: Milly Jakob, MD;  Location: [facility];  Service: Orthopedics;  Laterality: Right;  PREOP BLOCK  . TOE SURGERY     left great toe, repair of severed tendon  . TONSILLECTOMY    .  tummy tuck      Family History  Problem Relation Age of Onset  . Diabetes type II Mother   . Hypertension Mother   . Diabetes Mother   . Diabetes Mellitus II Father   . Pulmonary fibrosis Father   . Diabetes Father   . Hypertension Father   . Pulmonary disease Father   . High Cholesterol Father   . Hypertension Sister   . Diabetes Sister   . Cancer Maternal Grandmother        colon  . Allergic Disorder Son   . Cancer Maternal Grandfather        lung, smoker  . Allergic Disorder Son   . Asthma Son   . Colon cancer Neg Hx   . Esophageal cancer Neg Hx   . Pancreatic cancer Neg Hx   . Stomach cancer Neg Hx     Social History   Socioeconomic History  . Marital status: Married    Spouse name: Trilby Drummer  . Number of children: Not on file  . Years of education: Not on file  . Highest education level: Not on file  Occupational History  . Occupation: Patient [employer]  Tobacco Use  . Smoking status: Never Smoker  . Smokeless tobacco: Never Used  Substance and Sexual Activity  .  Alcohol use: No    Comment: never  . Drug use: No  . Sexual activity: Yes    Birth control/protection: Surgical  Other Topics Concern  . 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 Difficulty of Paying Living Expenses: Not on file  Food Insecurity:   . Worried About Charity fundraiser in the Last Year: Not on file  . Ran Out of Food in the Last Year: Not on file  Transportation Needs:   . Lack of Transportation (Medical): Not on file  . Lack of Transportation  (Non-Medical): Not on file  Physical Activity:   . Days of Exercise per Week: Not on file  . Minutes of Exercise per Session: Not on file  Stress:   . Feeling of Stress : Not on file  Social Connections:   . Frequency of Communication with Friends and Family: Not on file  . Frequency of Social Gatherings with Friends and Family: Not on file  . Attends Religious Services: Not on file  . Active Member of Clubs or Organizations: Not on file  . Attends Archivist Meetings: Not on file  . Marital Status: Not on file  Intimate Partner Violence:   . Fear of Current or Ex-Partner: Not on file  . Emotionally Abused: Not on file  . Physically Abused: Not on file  . Sexually Abused: Not on file     PHYSICAL EXAM:  VS: BP 134/89   Pulse 72   Ht 5\' 1"  (1.549 m)   Wt 175 lb (79.4 kg)   BMI 33.07 kg/m  Physical Exam Gen: NAD, alert, cooperative with exam, well-appearing MSK:  Right shoulder: Limitation with external rotation Normal internal rotation. Normal strength resistance. No pain with empty can testing. Neurovascularly intact     ASSESSMENT & PLAN:   Capsulitis of right shoulder Fell down the stairs earlier this week.  Most of her symptoms have improved but still having some pain with her shoulder function.  This appears to be more of a capsular issue as opposed to rotator cuff. -Counseled on home exercise therapy and supportive care. -Prednisone. -Provided work note. -Could consider physical therapy.

## 2020-03-25 NOTE — Patient Instructions (Signed)
Nice to meet you Please stop the mobic while on the prednisone  Please try the exercises  Please let me know about work   Please send me a message in [place] with any questions or updates.  Please see me back in 1-2 weeks.   --Dr. Raeford Razor

## 2020-03-25 NOTE — Assessment & Plan Note (Signed)
Fell down the stairs earlier this week.  Most of her symptoms have improved but still having some pain with her shoulder function.  This appears to be more of a capsular issue as opposed to rotator cuff. -Counseled on home exercise therapy and supportive care. -Prednisone. -Provided work note. -Could consider physical therapy.

## 2020-03-28 ENCOUNTER — Encounter: Payer: Self-pay | Admitting: Family Medicine

## 2020-03-28 DIAGNOSIS — M778 Other enthesopathies, not elsewhere classified: Secondary | ICD-10-CM | POA: Diagnosis not present

## 2020-03-29 MED FILL — TOPIRAMATE 50 MG TABLET: 50 | 60 days supply | Qty: 60 | Fill #3

## 2020-03-29 MED FILL — ATORVASTATIN CALCIUM 10 MG: 10 | 90 days supply | Qty: 90 | Fill #1

## 2020-03-29 MED FILL — AMLODIPINE BESYLATE 5 MG TA: 5 | 90 days supply | Qty: 90 | Fill #1

## 2020-03-31 ENCOUNTER — Other Ambulatory Visit: Payer: Self-pay

## 2020-03-31 ENCOUNTER — Encounter: Payer: Self-pay | Admitting: Family Medicine

## 2020-03-31 ENCOUNTER — Ambulatory Visit (INDEPENDENT_AMBULATORY_CARE_PROVIDER_SITE_OTHER): Payer: 59 | Admitting: Family Medicine

## 2020-03-31 DIAGNOSIS — M778 Other enthesopathies, not elsewhere classified: Secondary | ICD-10-CM

## 2020-03-31 NOTE — Assessment & Plan Note (Signed)
Significant improvement of her pain.  Has a slight pain with internal rotation.  This should likely continue to improve.  Her range of motion is back to normal. -Counseled on home exercise therapy and supportive care. -Provided work note. -Could consider imaging or physical therapy.

## 2020-03-31 NOTE — Progress Notes (Signed)
Monique Wiggins - 54 y.o. female MRN [id number]  Date of birth: [birth year]  SUBJECTIVE:  Including CC & ROS.  Chief Complaint  Patient presents with  . Follow-up    right shoulder    Monique Wiggins is a 54 y.o. female that is following up for her right shoulder.  She reports significant improvement.  She has been doing lighter duty at work.  She feels almost near back to normal.  She does have some pain with internal rotation.   Review of Systems See HPI   HISTORY: Past Medical, Surgical, Social, and Family History Reviewed & Updated per EMR.   Pertinent Historical Findings include:  Past Medical History:  Diagnosis Date  . Allergy   . Arthritis   . Back pain   . Carpal tunnel syndrome   . Diabetes mellitus without complication ([place])   . DVT (deep venous thrombosis) ([place])    after long car trip  . Dyspnea   . GERD (gastroesophageal reflux disease)   . H. pylori infection    1997  . Heart murmur   . Hypertension   . Hypertriglyceridemia 02/22/2017  . Hypertriglyceridemia 02/22/2017  . Migraines   . Sickle cell trait ([place])   . Sleep apnea    states she no longer has OSA lost weight    Past Surgical History:  Procedure Laterality Date  . ABDOMINAL HYSTERECTOMY     ovaries left in place  . CARPAL TUNNEL RELEASE Right 01/19/2019   Procedure: ENDOSCOPIC CARPAL TUNNEL RELEASE;  Surgeon: Milly Jakob, MD;  Location: [facility];  Service: Orthopedics;  Laterality: Right;  . CARPOMETACARPEL SUSPENSION PLASTY Right 01/19/2019   Procedure: RIGHT THUMB SUSPENSION ARTHROPLASTY;  Surgeon: Milly Jakob, MD;  Location: [facility];  Service: Orthopedics;  Laterality: Right;  PREOP BLOCK  . TOE SURGERY     left great toe, repair of severed tendon  . TONSILLECTOMY    . tummy tuck      Family History  Problem Relation Age of Onset  . Diabetes type II Mother   . Hypertension Mother   . Diabetes Mother   . Diabetes Mellitus II Father    . Pulmonary fibrosis Father   . Diabetes Father   . Hypertension Father   . Pulmonary disease Father   . High Cholesterol Father   . Hypertension Sister   . Diabetes Sister   . Cancer Maternal Grandmother        colon  . Allergic Disorder Son   . Cancer Maternal Grandfather        lung, smoker  . Allergic Disorder Son   . Asthma Son   . Colon cancer Neg Hx   . Esophageal cancer Neg Hx   . Pancreatic cancer Neg Hx   . Stomach cancer Neg Hx     Social History   Socioeconomic History  . Marital status: Married    Spouse name: Trilby Drummer  . Number of children: Not on file  . Years of education: Not on file  . Highest education level: Not on file  Occupational History  . Occupation: Patient [employer]  Tobacco Use  . Smoking status: Never Smoker  . Smokeless tobacco: Never Used  Substance and Sexual Activity  . Alcohol use: No    Comment: never  . Drug use: No  . Sexual activity: Yes    Birth control/protection: Surgical  Other Topics Concern  . 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 Difficulty of Paying Living Expenses: Not on file  Food Insecurity:   . Worried About Charity fundraiser in the Last Year: Not on file  . Ran Out of Food in the Last Year: Not on file  Transportation Needs:   . Lack of Transportation (Medical): Not on file  . Lack of Transportation (Non-Medical): Not on file  Physical Activity:   . Days of Exercise per Week: Not on file  . Minutes of Exercise per Session: Not on file  Stress:   . Feeling of Stress :  Not on file  Social Connections:   . Frequency of Communication with Friends and Family: Not on file  . Frequency of Social Gatherings with Friends and Family: Not on file  . Attends Religious Services: Not on file  . Active Member of Clubs or Organizations: Not on file  . Attends Archivist Meetings: Not on file  . Marital Status: Not on file  Intimate Partner Violence:   . Fear of Current or Ex-Partner: Not on file  . Emotionally Abused: Not on file  . Physically Abused: Not on file  . Sexually Abused: Not on file     PHYSICAL EXAM:  VS: BP 139/85   Pulse 75   Ht 5\' 1"  (1.549 m)   Wt 175 lb (79.4 kg)   BMI 33.07 kg/m  Physical Exam Gen: NAD, alert, cooperative with exam, well-appearing MSK:  Right shoulder: Normal external rotation and abduction. Some pain with internal rotation.  Some limitation compared to contralateral side. Normal empty can test. Normal speeds test. Neurovascular intact     ASSESSMENT & PLAN:   Capsulitis of right shoulder Significant improvement of her pain.  Has a slight pain with internal rotation.  This should likely continue to improve.  Her range of motion is back to normal. -Counseled on home exercise therapy and supportive care. -Provided work note. -Could consider imaging or physical therapy.

## 2020-03-31 NOTE — Patient Instructions (Signed)
Good to see you Please try the exercises  Please use ice as needed   Please send me a message in MyChart with any questions or updates.  Please see me back in 4 weeks.   --Dr. Raeford Razor

## 2020-04-11 ENCOUNTER — Other Ambulatory Visit: Payer: Self-pay | Admitting: Family Medicine

## 2020-04-11 MED FILL — FARXIGA 10 MG TABLET: 10 | 30 days supply | Qty: 30 | Fill #2

## 2020-04-12 ENCOUNTER — Other Ambulatory Visit: Payer: Self-pay | Admitting: Family Medicine

## 2020-04-12 MED FILL — POTASSIUM CHLORIDE CRYS ER: 20 | 90 days supply | Qty: 90 | Fill #0

## 2020-04-22 ENCOUNTER — Other Ambulatory Visit: Payer: Self-pay | Admitting: Family Medicine

## 2020-04-22 DIAGNOSIS — M25511 Pain in right shoulder: Secondary | ICD-10-CM

## 2020-05-04 ENCOUNTER — Ambulatory Visit: Payer: 59 | Admitting: Family Medicine

## 2020-05-27 MED FILL — FARXIGA 10 MG TABLET: 10 | 30 days supply | Qty: 30 | Fill #3

## 2020-05-30 DIAGNOSIS — H04123 Dry eye syndrome of bilateral lacrimal glands: Secondary | ICD-10-CM | POA: Diagnosis not present

## 2020-05-30 DIAGNOSIS — H18413 Arcus senilis, bilateral: Secondary | ICD-10-CM | POA: Diagnosis not present

## 2020-05-30 DIAGNOSIS — H40003 Preglaucoma, unspecified, bilateral: Secondary | ICD-10-CM | POA: Diagnosis not present

## 2020-05-30 DIAGNOSIS — H524 Presbyopia: Secondary | ICD-10-CM | POA: Diagnosis not present

## 2020-05-30 DIAGNOSIS — H52203 Unspecified astigmatism, bilateral: Secondary | ICD-10-CM | POA: Diagnosis not present

## 2020-05-30 DIAGNOSIS — E119 Type 2 diabetes mellitus without complications: Secondary | ICD-10-CM | POA: Diagnosis not present

## 2020-05-30 DIAGNOSIS — H40053 Ocular hypertension, bilateral: Secondary | ICD-10-CM | POA: Diagnosis not present

## 2020-06-27 ENCOUNTER — Other Ambulatory Visit (HOSPITAL_COMMUNITY): Payer: Self-pay

## 2020-06-27 DIAGNOSIS — M47816 Spondylosis without myelopathy or radiculopathy, lumbar region: Secondary | ICD-10-CM | POA: Diagnosis not present

## 2020-06-27 MED FILL — AMLODIPINE BESYLATE 5 MG TA: 5 | 90 days supply | Qty: 90 | Fill #2

## 2020-06-27 MED FILL — TOPIRAMATE 50 MG TABLET: 50 | 60 days supply | Qty: 60 | Fill #4

## 2020-06-27 MED FILL — MELOXICAM 15 MG TABLET: 15 | 90 days supply | Qty: 90 | Fill #0

## 2020-06-27 MED FILL — LOSARTAN POTASSIUM 100 MG T: 100 | 90 days supply | Qty: 90 | Fill #0

## 2020-06-27 MED FILL — FARXIGA 10 MG TABLET: 10 | 30 days supply | Qty: 30 | Fill #4

## 2020-06-27 MED FILL — HYDROCHLOROTHIAZIDE 25 MG T: 25 | 90 days supply | Qty: 90 | Fill #0

## 2020-06-27 MED FILL — ATORVASTATIN CALCIUM 10 MG: 10 | 90 days supply | Qty: 90 | Fill #2

## 2020-07-12 ENCOUNTER — Encounter: Payer: Self-pay | Admitting: Gastroenterology

## 2020-07-12 ENCOUNTER — Ambulatory Visit: Payer: 59 | Admitting: Gastroenterology

## 2020-07-12 ENCOUNTER — Other Ambulatory Visit (INDEPENDENT_AMBULATORY_CARE_PROVIDER_SITE_OTHER): Payer: 59

## 2020-07-12 VITALS — BP 124/82 | HR 64 | Ht 61.0 in | Wt 194.0 lb

## 2020-07-12 DIAGNOSIS — K219 Gastro-esophageal reflux disease without esophagitis: Secondary | ICD-10-CM

## 2020-07-12 DIAGNOSIS — R6881 Early satiety: Secondary | ICD-10-CM | POA: Diagnosis not present

## 2020-07-12 DIAGNOSIS — K5900 Constipation, unspecified: Secondary | ICD-10-CM

## 2020-07-12 DIAGNOSIS — R140 Abdominal distension (gaseous): Secondary | ICD-10-CM

## 2020-07-12 DIAGNOSIS — K760 Fatty (change of) liver, not elsewhere classified: Secondary | ICD-10-CM

## 2020-07-12 LAB — IBC + FERRITIN
Ferritin: 122.6 ng/mL (ref 10.0–291.0)
Iron: 74 ug/dL (ref 42–145)
Saturation Ratios: 18.5 % — ABNORMAL LOW (ref 20.0–50.0)
Transferrin: 285 mg/dL (ref 212.0–360.0)

## 2020-07-12 MED ORDER — LINACLOTIDE 145 MCG PO CAPS: 145.0000 ug | ORAL_CAPSULE | Freq: Every day | ORAL | 0 refills | Status: DC

## 2020-07-12 MED ORDER — DICYCLOMINE HCL 10 MG PO CAPS: 10.0000 mg | ORAL_CAPSULE | Freq: Three times a day (TID) | ORAL | 1 refills | Status: DC | PRN

## 2020-07-12 NOTE — Patient Instructions (Addendum)
If you are age 56 or older, your body mass index should be between 23-30. Your Body mass index is 36.66 kg/m. If this is out of the aforementioned range listed, please consider follow up with your Primary Care Provider.  If you are age 74 or younger, your body mass index should be between 19-25. Your Body mass index is 36.66 kg/m. If this is out of the aformentioned range listed, please consider follow up with your Primary Care Provider.   You have been scheduled for an endoscopy. Please follow written instructions given to you at your visit today. If you use inhalers (even only as needed), please bring them with you on the day of your procedure.   Please go to the lab in the basement of our building to have lab work done as you leave today. Hit "B" for basement when you get on the elevator.  When the doors open the lab is on your left.  We will call you with the results. Thank you.   We have sent the following medications to your pharmacy for you to pick up at your convenience:  Bentyl 10 mg: Take every 8 hours as needed  Increase Miralax to twice a day OR use the sample of Linzess 145 mcg we are gving you today. Take one in the morning before breakfast.  We are giving you a Low FOD-MAP diet to review and follow.  Thank you for entrusting me with your care and for choosing [facility], Dr. [place] Cellar

## 2020-07-12 NOTE — Progress Notes (Signed)
HPI :  55 year old female with history of diabetes, back pain, GERD, referred here by Vivien Rossetti MD for abdominal bloating, chronic constipation, early satiety.  I saw her back in 2018 for screening colonoscopy, have not seen her since that time.  Patient states her symptoms have been ongoing for a few years although worsening over time.  She has had a history of reflux, symptoms of pyrosis for which she has been using mostly Tums as needed recently.  Previously was on a course of omeprazole which certainly helped when she was having some occasional dysphagia.  Now reflux not as bad as it was before but happens occasionally.  She has been having early satiety and feels significantly distended in her abdomen after she eats something.  This often happens within minutes of eating.  She has been afraid to eat at work due to the symptoms and has been eating less.  Despite this she denies any weight loss.  She has been having belching easily after she eats as well.  She endorses mostly upper abdominal distention, "feels pregnant" when this occurs and makes her very uncomfortable.  She has diabetes and her A1c has ranged from 6.9-8.0 over the past year.  More recently it has been a bit better.  She denies any routine NSAID use.  She does endorse chronic constipation.  She takes a stool softener daily as well as MiraLAX once daily and passes 1 bowel movement per day however states she still has hard stool and has a hard time evacuating her colon despite this regimen.  She had a colonoscopy with me in 2018 which did not show any high risk lesions, had a few benign hyperplastic polyps.  She denies any blood in her stools.  She did have some symptomatic hemorrhoids in the setting of constipation in recent months, this was managed with sitz bath's and eventually improved.  She is on Metformin and Farxiga for her DM and states symptoms predate the use of these medications.  Tends to feel bloated and discomfort after  anything she eats, does not necessarily have any particular foods which cause this.  She has never had an upper endoscopy.  No known history of gastroparesis.  She did have a right upper quadrant ultrasound in December 2020 showing a normal gallbladder with no gallstones.  She did have fatty infiltration of her liver without any obvious cirrhosis.  She does have a mild chronic ALT elevation noted in her chart ranging from 30s to 70s over the past few years.  She denies any alcohol use at all.  Prior workup: RUQ [name] - 05/27/19 - normal gallbladder, no gallstones, fatty liver without obvious cirrhosis   Colonoscopy 05/03/2017 - The perianal and digital rectal examinations were normal. - The terminal ileum appeared normal. - A 4 mm polyp was found in the transverse colon. The polyp was flat. The polyp was removed with a cold snare. Resection and retrieval were complete. - Two sessile polyps were found in the recto-sigmoid colon. The polyps were 3 to 4 mm in size. These polyps were removed with a cold snare. Resection and retrieval were complete. - A few small-mouthed diverticula were found in the ascending colon. - The exam was otherwise without abnormality.  Surgical [P], transverse and cecum, polyp (3) - HYPERPLASTIC POLYP(S). - THERE IS NO EVIDENCE OF MALIGNANCY.  Repeat in 10 years    Past Medical History:  Diagnosis Date  . Allergy   . Arthritis   . Back pain   .  Carpal tunnel syndrome   . Diabetes mellitus without complication ([place])   . DVT (deep venous thrombosis) ([place])    after long car trip  . Dyspnea   . GERD (gastroesophageal reflux disease)   . H. pylori infection    1997  . Heart murmur   . Hypertension   . Hypertriglyceridemia 02/22/2017  . Hypertriglyceridemia 02/22/2017  . Migraines   . Sickle cell trait ([place])   . Sleep apnea    states she no longer has OSA lost weight     Past Surgical History:  Procedure Laterality Date  . ABDOMINAL HYSTERECTOMY      ovaries left in place  . CARPAL TUNNEL RELEASE Right 01/19/2019   Procedure: ENDOSCOPIC CARPAL TUNNEL RELEASE;  Surgeon: Milly Jakob, MD;  Location: [facility];  Service: Orthopedics;  Laterality: Right;  . CARPOMETACARPEL SUSPENSION PLASTY Right 01/19/2019   Procedure: RIGHT THUMB SUSPENSION ARTHROPLASTY;  Surgeon: Milly Jakob, MD;  Location: [facility];  Service: Orthopedics;  Laterality: Right;  PREOP BLOCK  . TOE SURGERY     left great toe, repair of severed tendon  . TONSILLECTOMY    . tummy tuck     Family History  Problem Relation Age of Onset  . Diabetes type II Mother   . Hypertension Mother   . Diabetes Mother   . Diabetes Mellitus II Father   . Pulmonary fibrosis Father   . Diabetes Father   . Hypertension Father   . Pulmonary disease Father   . High Cholesterol Father   . Hypertension Sister   . Diabetes Sister   . Cancer Maternal Grandmother        colon  . Allergic Disorder Son   . Cancer Maternal Grandfather        lung, smoker  . Allergic Disorder Son   . Asthma Son   . Colon cancer Neg Hx   . Esophageal cancer Neg Hx   . Pancreatic cancer Neg Hx   . Stomach cancer Neg Hx    Social History   Tobacco Use  . Smoking status: Never Smoker  . Smokeless tobacco: Never Used  Vaping Use  . Vaping Use: Never used  Substance Use Topics  . Alcohol use: No    Comment: never  . Drug use: No   Current Outpatient Medications  Medication Sig Dispense Refill  . acetaminophen (TYLENOL) 325 MG tablet Take 2 tablets (650 mg total) by mouth every 6 (six) hours.    Marland Kitchen albuterol (VENTOLIN HFA) 108 (90 Base) MCG/ACT inhaler Inhale 2 puffs into the lungs every 6 (six) hours as needed for wheezing or shortness of breath. 1 Inhaler 2  . amLODipine (NORVASC) 5 MG tablet TAKE 1 TABLET (5 MG TOTAL) BY MOUTH DAILY. 90 tablet 3  . atorvastatin (LIPITOR) 10 MG tablet TAKE 1 TABLET (10 MG TOTAL) BY MOUTH DAILY. 90 tablet 4  . carvedilol (COREG) 25  MG tablet Take 1 tablet (25 mg total) by mouth 2 (two) times daily with a meal. 180 tablet 1  . cetirizine (ZYRTEC) 10 MG tablet Take 1 tablet (10 mg total) by mouth daily. 30 tablet 11  . Continuous Blood Gluc Sensor (FREESTYLE LIBRE 14 DAY SENSOR) MISC 1 each by Does not apply route every 14 (fourteen) days. 2 each 0  . cyclobenzaprine (FLEXERIL) 10 MG tablet Take 0.5-1 tablets (5-10 mg total) by mouth 3 (three) times daily as needed for muscle spasms. 21 tablet 0  . dicyclomine (BENTYL) 10 MG  capsule Take 1 capsule (10 mg total) by mouth every 8 (eight) hours as needed for spasms. 30 capsule 1  . FARXIGA 10 MG TABS tablet TAKE 1 TABLET BY MOUTH ONCE DAILY BEFORE BREAKFAST 30 tablet 6  . fluticasone (FLONASE) 50 MCG/ACT nasal spray Place 2 sprays into both nostrils daily. 16 g 1  . linaclotide (LINZESS) 145 MCG CAPS capsule Take 1 capsule (145 mcg total) by mouth daily before breakfast. EXP: 10-2020 12 capsule 0  . losartan-hydrochlorothiazide (HYZAAR) 100-25 MG tablet TAKE 1 TABLET BY MOUTH ONCE A DAY 90 tablet 5  . meloxicam (MOBIC) 15 MG tablet TAKE 1 TABLET(15 MG) BY MOUTH DAILY 30 tablet 0  . metFORMIN (GLUCOPHAGE) 500 MG tablet TAKE 1 TABLET BY MOUTH TWICE DAILY WITH A MEAL 180 tablet 1  . mometasone (ELOCON) 0.1 % cream Apply 1 application topically daily. 45 g 2  . neomycin-polymyxin-hydrocortisone (CORTISPORIN) OTIC solution Apply 1-2 drops to toes twice a day until all gone 10 mL 0  . omeprazole (PRILOSEC) 40 MG capsule Take 1 capsule (40 mg total) by mouth daily. 30 capsule 1  . potassium chloride SA (KLOR-CON) 20 MEQ tablet TAKE 1 TABLET BY MOUTH ONCE DAILY 90 tablet 5  . sertraline (ZOLOFT) 50 MG tablet Take 1 tablet (50 mg total) by mouth daily. 90 tablet 1  . topiramate (TOPAMAX) 50 MG tablet TAKE 1 TABLET (50 MG TOTAL) BY MOUTH DAILY. 60 tablet 5  . tretinoin (RETIN-A) 0.025 % cream APPLY TO THE AFFECTED AREA(S) AT BEDTIME 45 g 2   No current facility-administered medications for  this visit.   Allergies  Allergen Reactions  . Pollen Extract Swelling  . Shellfish Allergy Swelling     Review of Systems: All systems reviewed and negative except where noted in HPI.   Lab Results  Component Value Date   WBC 9.3 12/03/2019   HGB 14.8 12/03/2019   HCT 45.3 12/03/2019   MCV 81.3 12/03/2019   PLT 331.0 12/03/2019    Lab Results  Component Value Date   CREATININE 0.95 12/03/2019   BUN 27 (H) 12/03/2019   NA 137 12/03/2019   K 3.8 12/03/2019   CL 102 12/03/2019   CO2 26 12/03/2019    Lab Results  Component Value Date   ALT 36 (H) 12/03/2019   AST 19 12/03/2019   ALKPHOS 96 12/03/2019   BILITOT 0.5 12/03/2019     Physical Exam: BP 124/82   Pulse 64   Ht 5\' 1"  (1.549 m)   Wt 194 lb (88 kg)   BMI 36.66 kg/m  Constitutional: Pleasant,well-developed, female in no acute distress. HEENT: Normocephalic and atraumatic. Conjunctivae are normal. No scleral icterus. Neck supple.  Cardiovascular: Normal rate, regular rhythm.  Pulmonary/chest: Effort normal and breath sounds normal. . Abdominal: Soft, nondistended, nontender. There are no masses palpable.  Extremities: no edema Lymphadenopathy: No cervical adenopathy noted. Neurological: Alert and oriented to person place and time. Skin: Skin is warm and dry. No rashes noted. Psychiatric: Normal mood and affect. Behavior is normal.   ASSESSMENT AND PLAN: 55 year old female here to reestablish care for the following  Abdominal bloating / Early satiety / GERD - discussed her abdominal bloating and distention, in her case this could be multifactorial.  She has chronic constipation which needs to be addressed as outlined below, and this could be a component of her bloating however she also has most of her bloating in the upper abdomen, postprandial, in the setting of early satiety.  She has  underlying diabetes and at risk for gastroparesis which could also be causing this.  I discussed this and differential  diagnosis in general.  Given her longstanding reflux and early satiety, I am recommending an upper endoscopy to clear her upper GI tract.  We will plan on testing for H. pylori and making sure no PUD, gastric outlet obstruction or other pathology.  If this is negative then will consider empiric Reglan or further evaluation for gastroparesis with GES.  I discussed risk benefits of endoscopy and anesthesia and she wants to proceed.  In the interim I gave her some Bentyl to use as needed if she is having cramping with the bloating to see if that helps, as well as counseled her on a low FODMAP diet just to see if she has any high risk foods that she is ingesting that could increase her risk for intestinal gas and bloating.  She agreed with this, further recommendations pending the results of the EGD.  Chronic constipation - discussed options with her.  She can try increasing MiraLAX to twice daily to see if this will provide more relief.  Alternatively I gave her a sample of Linzess 145 mcg/day as a 2-week trial to see if she would prefer to use that.  Hopefully with a more aggressive bowel regimen her bloating will be less.  I reassured her that her colonoscopy is up-to-date no high risk lesions, I do not think she warrants another colonoscopy at this time. She agreed.  I asked her to contact me if she wants to use Linzess moving forward I can write a prescription for her, pending her course  Fatty liver - discussed her ultrasound findings and chronic mild intermittent ALT elevation.  She does not drink alcohol, suspect she has NAFLD in the setting of diabetes.  We discussed this and spectrum of fatty liver disease, long-term risks for fibrotic change and cirrhosis.  No evidence of that for now.  I will send a few serologies to ensure no evidence of other chronic liver diseases and if negative would assume her mild ALT elevation is due to fatty liver.  Ultimately weight loss and normalization of body mass index  would be best to manage this, will see her yearly for this issue. Will check immunity to hep A and B and vaccinate if needed otherwise.   [place] Cellar, MD [facility]  CC: Mosie Lukes, MD

## 2020-07-13 ENCOUNTER — Other Ambulatory Visit: Payer: Self-pay

## 2020-07-13 ENCOUNTER — Ambulatory Visit (AMBULATORY_SURGERY_CENTER): Payer: 59 | Admitting: Gastroenterology

## 2020-07-13 ENCOUNTER — Encounter: Payer: Self-pay | Admitting: Gastroenterology

## 2020-07-13 ENCOUNTER — Other Ambulatory Visit (HOSPITAL_COMMUNITY): Payer: Self-pay

## 2020-07-13 VITALS — BP 133/83 | HR 60 | Temp 98.4°F | Resp 16 | Ht 61.0 in | Wt 194.0 lb

## 2020-07-13 DIAGNOSIS — K280 Acute gastrojejunal ulcer with hemorrhage: Secondary | ICD-10-CM | POA: Diagnosis not present

## 2020-07-13 DIAGNOSIS — K319 Disease of stomach and duodenum, unspecified: Secondary | ICD-10-CM | POA: Diagnosis not present

## 2020-07-13 DIAGNOSIS — R140 Abdominal distension (gaseous): Secondary | ICD-10-CM | POA: Diagnosis not present

## 2020-07-13 DIAGNOSIS — K219 Gastro-esophageal reflux disease without esophagitis: Secondary | ICD-10-CM | POA: Diagnosis not present

## 2020-07-13 DIAGNOSIS — M47816 Spondylosis without myelopathy or radiculopathy, lumbar region: Secondary | ICD-10-CM | POA: Diagnosis not present

## 2020-07-13 DIAGNOSIS — K3189 Other diseases of stomach and duodenum: Secondary | ICD-10-CM | POA: Diagnosis not present

## 2020-07-13 DIAGNOSIS — K2980 Duodenitis without bleeding: Secondary | ICD-10-CM | POA: Diagnosis not present

## 2020-07-13 DIAGNOSIS — R6881 Early satiety: Secondary | ICD-10-CM

## 2020-07-13 MED ORDER — SODIUM CHLORIDE 0.9 % IV SOLN: 500.0000 mL | INTRAVENOUS | Status: DC

## 2020-07-13 MED ORDER — METOCLOPRAMIDE HCL 5 MG PO TABS: 5.0000 mg | ORAL_TABLET | Freq: Three times a day (TID) | ORAL | 2 refills | Status: DC

## 2020-07-13 MED FILL — GABAPENTIN 300 MG CAPSULE: 300 | 30 days supply | Qty: 30 | Fill #0

## 2020-07-13 NOTE — Op Note (Signed)
[facility] Patient Name: Monique Wiggins Procedure Date: 07/13/2020 11:34 AM MRN: [id number] Endoscopist: Remo Lipps P. Havery Moros , MD Age: 55 Referring MD:  Date of Birth: 1965-12-14 Gender: Female Account #: [id number] Procedure:                Upper GI endoscopy Indications:              Abdominal bloating, Early satiety - history of                            diabetes, history of GERD Medicines:                Monitored Anesthesia Care Procedure:                Pre-Anesthesia Assessment:                           - Prior to the procedure, a History and Physical                            was performed, and patient medications and                            allergies were reviewed. The patient's tolerance of                            previous anesthesia was also reviewed. The risks                            and benefits of the procedure and the sedation                            options and risks were discussed with the patient.                            All questions were answered, and informed consent                            was obtained. Prior Anticoagulants: The patient has                            taken no previous anticoagulant or antiplatelet                            agents. ASA Grade Assessment: III - A patient with                            severe systemic disease. After reviewing the risks                            and benefits, the patient was deemed in                            satisfactory condition to undergo the procedure.  After obtaining informed consent, the endoscope was                            passed under direct vision. Throughout the                            procedure, the patient's blood pressure, pulse, and                            oxygen saturations were monitored continuously. The                            Endoscope was introduced through the mouth, and                            advanced to the second  part of duodenum. The upper                            GI endoscopy was accomplished without difficulty.                            The patient tolerated the procedure well. Scope In: Scope Out: Findings:                 Esophagogastric landmarks were identified: the                            Z-line was found at 36 cm, the gastroesophageal                            junction was found at 36 cm and the upper extent of                            the gastric folds was found at 36 cm from the                            incisors.                           The exam of the esophagus was otherwise normal.                           Diffuse granular mucosa with patchy erythema, no                            ulcerations, was found in the gastric fundus, in                            the gastric body and in the gastric antrum.                            Biopsies were taken with a cold forceps for  Helicobacter pylori testing.                           The exam of the stomach was otherwise normal. A                            prominent fold was noted at the pylorus.                           A single nodule was found in the second portion of                            the duodenum - this could have been the ampulla, if                            so it was rather prominent, no overtly adenomatous                            tissue, could not see orifice given position of it.                            Biopsies were taken with a cold forceps for                            histology.                           The exam of the duodenum was otherwise normal. Complications:            No immediate complications. Estimated blood loss:                            Minimal. Estimated Blood Loss:     Estimated blood loss was minimal. Impression:               - Esophagogastric landmarks identified.                           - Normal esophagus otherwise                           -  Diffusely granular gastric mucosa with patchy                            erythema. Biopsied to rule out H pylori.                           - Normal stomach otherwise                           - Nodule found in the duodenum, suspect could be                            prominent / normal variant ampulla. Biopsied.                           -  Normal duodenum otherwise Recommendation:           - Patient has a contact number available for                            emergencies. The signs and symptoms of potential                            delayed complications were discussed with the                            patient. Return to normal activities tomorrow.                            Written discharge instructions were provided to the                            patient.                           - Resume previous diet.                           - Continue present medications.                           - Await pathology results.                           - Underlying gastroparesis is possible causing                            upper tract symptoms. Consider trial of Reglan 5mg                             three times daily with meals for a few weeks to see                            if this helps, will discuss with the patient Monique Wiggins. Monique Gribble, MD 07/13/2020 12:01:04 PM This report has been signed electronically.

## 2020-07-13 NOTE — Progress Notes (Signed)
PT taken to PACU. Monitors in place. VSS. Report given to RN. 

## 2020-07-13 NOTE — Progress Notes (Signed)
Called to room to assist during endoscopic procedure.  Patient ID and intended procedure confirmed with present staff. Received instructions for my participation in the procedure from the performing physician.  

## 2020-07-13 NOTE — Patient Instructions (Signed)
Handouts on polyps provided. Await pathology results.   YOU HAD AN ENDOSCOPIC PROCEDURE TODAY AT [facility]:   Refer to the procedure report that was given to you for any specific questions about what was found during the examination.  If the procedure report does not answer your questions, please call your gastroenterologist to clarify.  If you requested that your care partner not be given the details of your procedure findings, then the procedure report has been included in a sealed envelope for you to review at your convenience later.  YOU SHOULD EXPECT: Some feelings of bloating in the abdomen. Passage of more gas than usual.  Walking can help get rid of the air that was put into your GI tract during the procedure and reduce the bloating. If you had a lower endoscopy (such as a colonoscopy or flexible sigmoidoscopy) you may notice spotting of blood in your stool or on the toilet paper. If you underwent a bowel prep for your procedure, you may not have a normal bowel movement for a few days.  Please Note:  You might notice some irritation and congestion in your nose or some drainage.  This is from the oxygen used during your procedure.  There is no need for concern and it should clear up in a day or so.  SYMPTOMS TO REPORT IMMEDIATELY:    Following upper endoscopy (EGD)  Vomiting of blood or coffee ground material  New chest pain or pain under the shoulder blades  Painful or persistently difficult swallowing  New shortness of breath  Fever of 100F or higher  Black, tarry-looking stools  For urgent or emergent issues, a gastroenterologist can be reached at any hour by calling [phone]. Do not use MyChart messaging for urgent concerns.    DIET:  We do recommend a small meal at first, but then you may proceed to your regular diet.  Drink plenty of fluids but you should avoid alcoholic beverages for 24 hours.  ACTIVITY:  You should plan to take it easy for the rest  of today and you should NOT DRIVE or use heavy machinery until tomorrow (because of the sedation medicines used during the test).    FOLLOW UP: Our staff will call the number listed on your records 48-72 hours following your procedure to check on you and address any questions or concerns that you may have regarding the information given to you following your procedure. If we do not reach you, we will leave a message.  We will attempt to reach you two times.  During this call, we will ask if you have developed any symptoms of COVID 19. If you develop any symptoms (ie: fever, flu-like symptoms, shortness of breath, cough etc.) before then, please call [phone].  If you test positive for Covid 19 in the 2 weeks post procedure, please call and report this information to [name].    If any biopsies were taken you will be contacted by phone or by letter within the next 1-3 weeks.  Please call us at [phone] if you have not heard about the biopsies in 3 weeks.    SIGNATURES/CONFIDENTIALITY: You and/or your care partner have signed paperwork which will be entered into your electronic medical record.  These signatures attest to the fact that that the information above on your After Visit Summary has been reviewed and is understood.  Full responsibility of the confidentiality of this discharge information lies with you and/or your care-partner.

## 2020-07-13 NOTE — Progress Notes (Signed)
Pt's states no medical or surgical changes since previsit or office visit. 

## 2020-07-15 ENCOUNTER — Telehealth: Payer: Self-pay | Admitting: *Deleted

## 2020-07-15 NOTE — Telephone Encounter (Signed)
  Follow up Call-  Call back number 07/13/2020  Post procedure Call Back phone  # [phone]  Permission to leave phone message Yes  Some recent data might be hidden     Patient questions:  Do you have a fever, pain , or abdominal swelling? No. Pain Score  0 *  Have you tolerated food without any problems? Yes.    Have you been able to return to your normal activities? Yes.    Do you have any questions about your discharge instructions: Diet   No. Medications  No. Follow up visit  No.  Do you have questions or concerns about your Care? No.  Actions: * If pain score is 4 or above: No action needed, pain <4.  1. Have you developed a fever since your procedure? no  2.   Have you had an respiratory symptoms (SOB or cough) since your procedure? no  3.   Have you tested positive for COVID 19 since your procedure no  4.   Have you had any family members/close contacts diagnosed with the COVID 19 since your procedure?  no   If yes to any of these questions please route to Joylene John, RN and Joella Prince, RN

## 2020-07-17 LAB — ANTI-NUCLEAR AB-TITER (ANA TITER): ANA Titer 1: 1:80 {titer} — ABNORMAL HIGH

## 2020-07-17 LAB — HEPATITIS A ANTIBODY, TOTAL: Hepatitis A AB,Total: NONREACTIVE

## 2020-07-17 LAB — HEPATITIS C ANTIBODY
Hepatitis C Ab: NONREACTIVE
SIGNAL TO CUT-OFF: 0.01 (ref <1.00)

## 2020-07-17 LAB — HEPATITIS B SURFACE ANTIGEN: Hepatitis B Surface Ag: NONREACTIVE

## 2020-07-17 LAB — ALPHA-1-ANTITRYPSIN: A-1 Antitrypsin, Ser: 114 mg/dL (ref 83–199)

## 2020-07-17 LAB — IGG: IgG (Immunoglobin G), Serum: 1365 mg/dL (ref 600–1640)

## 2020-07-17 LAB — HEPATITIS B SURFACE ANTIBODY,QUALITATIVE: Hep B S Ab: REACTIVE — AB

## 2020-07-17 LAB — ANTI-SMOOTH MUSCLE ANTIBODY, IGG: Actin (Smooth Muscle) Antibody (IGG): <20 U (ref <20)

## 2020-07-17 LAB — ANA: Anti Nuclear Antibody (ANA): POSITIVE — AB

## 2020-07-19 ENCOUNTER — Other Ambulatory Visit: Payer: Self-pay

## 2020-07-19 DIAGNOSIS — K760 Fatty (change of) liver, not elsewhere classified: Secondary | ICD-10-CM

## 2020-07-20 ENCOUNTER — Ambulatory Visit: Payer: 59 | Admitting: Gastroenterology

## 2020-07-25 ENCOUNTER — Ambulatory Visit (INDEPENDENT_AMBULATORY_CARE_PROVIDER_SITE_OTHER): Payer: 59 | Admitting: Gastroenterology

## 2020-07-25 ENCOUNTER — Other Ambulatory Visit: Payer: Self-pay | Admitting: Family Medicine

## 2020-07-25 DIAGNOSIS — Z23 Encounter for immunization: Secondary | ICD-10-CM | POA: Diagnosis not present

## 2020-07-26 ENCOUNTER — Other Ambulatory Visit: Payer: Self-pay | Admitting: Family Medicine

## 2020-08-01 ENCOUNTER — Other Ambulatory Visit: Payer: Self-pay

## 2020-08-01 MED ORDER — LINACLOTIDE 145 MCG PO CAPS: 145.0000 ug | ORAL_CAPSULE | Freq: Every day | ORAL | 2 refills | Status: AC

## 2020-08-04 MED FILL — CARVEDILOL 25 MG TABS: 25 | 90 days supply | Qty: 180 | Fill #0

## 2020-08-08 MED FILL — GABAPENTIN 300 MG CAPSULE: 300 | 30 days supply | Qty: 30 | Fill #1

## 2020-08-16 DIAGNOSIS — M47816 Spondylosis without myelopathy or radiculopathy, lumbar region: Secondary | ICD-10-CM | POA: Diagnosis not present

## 2020-08-23 ENCOUNTER — Other Ambulatory Visit (HOSPITAL_COMMUNITY): Payer: Self-pay | Admitting: Physician Assistant

## 2020-08-23 ENCOUNTER — Telehealth: Payer: 59 | Admitting: Physician Assistant

## 2020-08-23 DIAGNOSIS — B029 Zoster without complications: Secondary | ICD-10-CM | POA: Diagnosis not present

## 2020-08-23 MED ORDER — VALACYCLOVIR HCL 1 G PO TABS: 1000.0000 mg | ORAL_TABLET | Freq: Three times a day (TID) | ORAL | 0 refills | Status: DC

## 2020-08-23 NOTE — Addendum Note (Signed)
Addended by: Raiford Noble C on: 08/23/2020 10:20 AM   Modules accepted: Orders

## 2020-08-23 NOTE — Progress Notes (Signed)
E-visit for Shingles   We are sorry that you are not feeling well. Here is how we plan to help!  Based on what you shared with me it looks like you have shingles.  Shingles or herpes zoster, is a common infection of the nerves.  It is a painful rash caused by the herpes zoster virus.  This is the same virus that causes chickenpox.  After a person has chickenpox, the virus remains inactive in the nerve cells.  Years later, the virus can become active again and travel to the skin.  It typically will appear on one side of the face or body.  Burning or shooting pain, tingling, or itching are early signs of the infection.  Blisters typically scab over in 7 to 10 days and clear up within 2-4 weeks. Shingles is only contagious to people that have never had the chickenpox, the chickenpox vaccine, or anyone who has a compromised immune system.  You should avoid contact with these type of people until your blisters scab over.  I have prescribed Valacyclovir 1g three times daily for 7 day. If the rash moves closer to the eye or you note any eye pain or vision changes, you need to be seen at the ER ASAP. I also recommend you reach out to your PCP today as they may want to set you up with an Ophthalmologist giving location of this rash.    HOME CARE: . Apply ice packs (wrapped in a thin towel), cool compresses, or soak in cool bath to help reduce pain. . Use calamine lotion to calm itchy skin. . Avoid scratching the rash. . Avoid direct sunlight.  GET HELP RIGHT AWAY IF: . Symptoms that don't away after treatment. . A rash or blisters near your eye. . Increased drainage, fever, or rash after treatment. . Severe pain that doesn't go away.   MAKE SURE YOU    Understand these instructions.  Will watch your condition.  Will get help right away if you are not doing well or get worse.  Thank you for choosing an e-visit. Your e-visit answers were reviewed by a board certified advanced clinical  practitioner to complete your personal care plan. Depending upon the condition, your plan could have included both over the counter or prescription medications.  Please review your pharmacy choice. Make sure the pharmacy is open so you can pick up prescription now. If there is a problem, you may contact your provider through [employer] and have the prescription routed to another pharmacy.  Your safety is important to [name]. If you have drug allergies check your prescription carefully.   For the next 24 hours you can use MyChart to ask questions about today's visit, request a non-urgent call back, or ask for a work or school excuse.  You will get an email in the next two days asking about your experience. I hope that your e-visit has been valuable and will speed your recovery

## 2020-08-23 NOTE — Progress Notes (Signed)
Message sent to patient requesting further input regarding current symptoms. Awaiting patient response.  

## 2020-08-23 NOTE — Progress Notes (Signed)
I have spent 5 minutes in review of e-visit questionnaire, review and updating patient chart, medical decision making and response to patient.   Lanyia Jewel Cody Ginger Leeth, PA-C    

## 2020-08-31 ENCOUNTER — Ambulatory Visit: Payer: 59 | Admitting: Family Medicine

## 2020-08-31 ENCOUNTER — Encounter: Payer: Self-pay | Admitting: Family Medicine

## 2020-08-31 ENCOUNTER — Other Ambulatory Visit: Payer: Self-pay

## 2020-08-31 VITALS — BP 118/68 | HR 67 | Temp 98.0°F | Resp 18 | Ht 61.0 in | Wt 193.8 lb

## 2020-08-31 DIAGNOSIS — B0230 Zoster ocular disease, unspecified: Secondary | ICD-10-CM | POA: Diagnosis not present

## 2020-08-31 DIAGNOSIS — H5213 Myopia, bilateral: Secondary | ICD-10-CM | POA: Diagnosis not present

## 2020-08-31 DIAGNOSIS — B029 Zoster without complications: Secondary | ICD-10-CM | POA: Diagnosis not present

## 2020-08-31 DIAGNOSIS — H40053 Ocular hypertension, bilateral: Secondary | ICD-10-CM | POA: Diagnosis not present

## 2020-08-31 MED ORDER — PREDNISONE 20 MG PO TABS: 40.0000 mg | ORAL_TABLET | Freq: Every day | ORAL | 0 refills | Status: DC

## 2020-08-31 MED ORDER — GABAPENTIN 300 MG PO CAPS
300.0000 mg | ORAL_CAPSULE | Freq: Three times a day (TID) | ORAL | 2 refills | Status: DC
Start: 2020-09-05 — End: 2020-08-31

## 2020-08-31 MED ORDER — GABAPENTIN 300 MG PO CAPS: 300.0000 mg | ORAL_CAPSULE | Freq: Three times a day (TID) | ORAL | 2 refills | Status: DC

## 2020-08-31 MED ORDER — PREDNISONE 20 MG PO TABS: 40.0000 mg | ORAL_TABLET | Freq: Every day | ORAL | 0 refills | Status: AC

## 2020-08-31 NOTE — Progress Notes (Signed)
Chief Complaint  Patient presents with  . Rash    Pt states having a e-visit on 08/23/20 and Dx with Shingles. Pt states she went to the eye doctor this morning. Pt was given Valacyclovir about 1 week ago and has not helped.     Monique Wiggins is a 55 y.o. female here for a skin complaint.  Duration: 8 days Location: R side of face/nose Pruritic? Yes Painful? Yes Drainage? No New soaps/lotions/topicals/detergents? No Sick contacts? No Other associated symptoms: spreading Therapies tried thus far: Valtrex, has one pill left; saw eye provider this AM  Past Medical History:  Diagnosis Date  . Allergy   . Arthritis   . Back pain   . Carpal tunnel syndrome   . Diabetes mellitus without complication ([place])   . DVT (deep venous thrombosis) ([place])    after long car trip  . Dyspnea   . GERD (gastroesophageal reflux disease)   . H. pylori infection    1997  . Heart murmur   . Hypertension   . Hypertriglyceridemia 02/22/2017  . Hypertriglyceridemia 02/22/2017  . Migraines   . Sickle cell trait ([place])   . Sleep apnea    states she no longer has OSA lost weight    BP 118/68 (BP Location: Right Arm, Patient Position: Sitting, Cuff Size: Normal)   Pulse 67   Temp 98 F (36.7 C) (Oral)   Resp 18   Ht 5\' 1"  (1.549 m)   Wt 193 lb 12.8 oz (87.9 kg)   SpO2 96%   BMI 36.62 kg/m  Gen: awake, alert, appearing stated age Lungs: No accessory muscle use Skin: Band of redness with overlying vesicles over L portion of nose and over L maxillary sinus. No drainage, excoriation, scaling Psych: Age appropriate judgment and insight  Herpes zoster with ophthalmic complication, unspecified herpes zoster eye disease - Plan: predniSONE (DELTASONE) 20 MG tablet, gabapentin (NEURONTIN) 300 MG capsule  She is finishing Valtrex. Consider ice and PO antihistamine for itching. Try not to itch. Pred burst 40 mg/d for 5 d. Gabapentin 300 mg tid for neuralgia after that.  F/u prn. The patient voiced  understanding and agreement to the plan.  [place]y, DO 08/31/20 2:53 PM

## 2020-08-31 NOTE — Patient Instructions (Signed)
Ice/cold pack over area for 10-15 min twice daily.  OK to take Tylenol 1000 mg (2 extra strength tabs) or 975 mg (3 regular strength tabs) every 6 hours as needed.  Try not to itch.   Claritin (loratadine), Allegra (fexofenadine), Zyrtec (cetirizine) which is also equivalent to Xyzal (levocetirizine); these are listed in order from weakest to strongest. Generic, and therefore cheaper, options are in the parentheses.   There are available OTC, and the generic versions, which may be cheaper, are in parentheses. Show this to a pharmacist if you have trouble finding any of these items.  Let us know if you need anything.

## 2020-09-02 DIAGNOSIS — E119 Type 2 diabetes mellitus without complications: Secondary | ICD-10-CM | POA: Diagnosis not present

## 2020-09-02 DIAGNOSIS — E0500 Thyrotoxicosis with diffuse goiter without thyrotoxic crisis or storm: Secondary | ICD-10-CM | POA: Diagnosis not present

## 2020-09-02 DIAGNOSIS — H3581 Retinal edema: Secondary | ICD-10-CM | POA: Diagnosis not present

## 2020-09-02 DIAGNOSIS — Z7984 Long term (current) use of oral hypoglycemic drugs: Secondary | ICD-10-CM | POA: Diagnosis not present

## 2020-09-06 MED FILL — FARXIGA 10 MG TABLET: 10 | 30 days supply | Qty: 30 | Fill #5

## 2020-09-06 MED FILL — GABAPENTIN 300 MG CAPSULE: 300 | 30 days supply | Qty: 30 | Fill #2

## 2020-09-08 ENCOUNTER — Other Ambulatory Visit (HOSPITAL_BASED_OUTPATIENT_CLINIC_OR_DEPARTMENT_OTHER): Payer: Self-pay

## 2020-09-11 DIAGNOSIS — E0500 Thyrotoxicosis with diffuse goiter without thyrotoxic crisis or storm: Secondary | ICD-10-CM | POA: Diagnosis not present

## 2020-09-13 DIAGNOSIS — E0500 Thyrotoxicosis with diffuse goiter without thyrotoxic crisis or storm: Secondary | ICD-10-CM | POA: Diagnosis not present

## 2020-09-22 DIAGNOSIS — H05243 Constant exophthalmos, bilateral: Secondary | ICD-10-CM | POA: Diagnosis not present

## 2020-10-10 ENCOUNTER — Other Ambulatory Visit: Payer: Self-pay | Admitting: Family Medicine

## 2020-10-10 ENCOUNTER — Other Ambulatory Visit (HOSPITAL_COMMUNITY): Payer: Self-pay

## 2020-10-10 MED ORDER — TOPIRAMATE 50 MG PO TABS
50.0000 mg | ORAL_TABLET | Freq: Every day | ORAL | 1 refills | Status: DC
Start: 2020-10-10 — End: 2021-05-04
  Filled 2020-10-10: qty 90, 90d supply, fill #0
  Filled 2021-01-10: qty 90, 90d supply, fill #1

## 2020-10-10 MED FILL — Dapagliflozin Propanediol Tab 10 MG (Base Equivalent): ORAL | 30 days supply | Qty: 30 | Fill #0 | Status: AC

## 2020-10-10 MED FILL — Losartan Potassium Tab 100 MG: ORAL | 90 days supply | Qty: 90 | Fill #0 | Status: AC

## 2020-10-12 ENCOUNTER — Other Ambulatory Visit (HOSPITAL_COMMUNITY): Payer: Self-pay

## 2020-10-14 ENCOUNTER — Other Ambulatory Visit (HOSPITAL_COMMUNITY): Payer: Self-pay

## 2020-10-17 ENCOUNTER — Other Ambulatory Visit (HOSPITAL_COMMUNITY): Payer: Self-pay

## 2020-11-01 ENCOUNTER — Telehealth: Payer: Self-pay | Admitting: Family Medicine

## 2020-11-01 DIAGNOSIS — H5789 Other specified disorders of eye and adnexa: Secondary | ICD-10-CM | POA: Diagnosis not present

## 2020-11-01 DIAGNOSIS — E079 Disorder of thyroid, unspecified: Secondary | ICD-10-CM | POA: Diagnosis not present

## 2020-11-01 NOTE — Telephone Encounter (Signed)
See below

## 2020-11-01 NOTE — Telephone Encounter (Signed)
Patient was told by her eye doctor that she need to see an endocrinolgist blurred vision in one eye that may be thyroid related

## 2020-11-01 NOTE — Telephone Encounter (Signed)
I have not seen her in nearly 2 years and she needs labs unless she has done them elsewhere. Am willing to place the referral but she needs a follow up appt to get labs here and once that is done then place the endocrinology referral for thyroid disease and diabetes. Can be at [facility] or downtown at patient discretion.

## 2020-11-02 NOTE — Telephone Encounter (Signed)
Fyi this will be seen Monday by you. She needs a referral to endo. Per eye doctor.

## 2020-11-07 ENCOUNTER — Telehealth: Payer: Self-pay | Admitting: Medical

## 2020-11-07 ENCOUNTER — Encounter: Payer: Self-pay | Admitting: Medical

## 2020-11-07 ENCOUNTER — Ambulatory Visit (INDEPENDENT_AMBULATORY_CARE_PROVIDER_SITE_OTHER): Payer: 59 | Admitting: Medical

## 2020-11-07 NOTE — Progress Notes (Signed)
   Subjective:    Patient ID: Monique Wiggins, female    DOB: 10-31-1965, 55 y.o.   MRN: [id number]  HPI  Pt showed up 15 mintues late. Asked to rescheudule.  Review of Systems     Objective:   Physical Exam        Assessment & Plan:

## 2020-11-07 NOTE — Telephone Encounter (Signed)
This is a message that pt sent to me on my chart. I was informed she showed up late. It was about 4:35 at time I was notified. She was scheduled at 4:20. I had asked staff to reschedule her.   She had contacted you wanting referral to endocrinologist. Your response to pt on Nov 01, 2020. "have not seen her in nearly 2 years and she needs labs unless she has done them elsewhere. Am willing to place the referral but she needs a follow up appt to get labs here and once that is done then place the endocrinology referral for thyroid disease and diabetes. Can be at [facility] or downtown at patient discretion."    She had labs done  09-02-2020 at  [place] like she has initial consult appointment with Ednocrinology with bapist on 01/30/2021. For thyroid eye disease possible concern?  She probably wants sooner appt.    Below the message she sent me after hours I had an appointment with you today at 4:20. I called to inform you clinic,  nurse  or whoever that I was run 10 minutes late due to an accident.  I was told that there is a 10 minutes  grace period and as long as I arrived by 4:30 that it would be fine.  I called ahead for a reason,  so I won't waste my time, gas and energy rushing there. I arrived at exactly 4:30 on the dot. You new receptionist took her sweet time sending a message before deciding to go to the back to inform you'll that I was here. Just as your time is  valuable my time is as well. I don't appreciate being informed of your refusal to see me epe especially after I was told that if I arrived by a certain time it would be OK.  I feel this was very unprofessional  And this office needs to get it together. 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  I will be addressing this matter with doctor Blythe.

## 2020-11-07 NOTE — Telephone Encounter (Signed)
Sorry you had to deal with this. Please make sure to get her an appt with me. I will make sure she knows how hard you and the office work and try and take care of this.

## 2020-11-08 ENCOUNTER — Encounter (HOSPITAL_COMMUNITY): Payer: Self-pay | Admitting: Student

## 2020-11-08 ENCOUNTER — Emergency Department (HOSPITAL_COMMUNITY)
Admission: EM | Admit: 2020-11-08 | Discharge: 2020-11-08 | Disposition: A | Discharge status: Home / Self Care | Payer: 59 | Attending: Emergency Medicine | Admitting: Emergency Medicine

## 2020-11-08 ENCOUNTER — Other Ambulatory Visit: Payer: Self-pay

## 2020-11-08 DIAGNOSIS — E119 Type 2 diabetes mellitus without complications: Secondary | ICD-10-CM | POA: Diagnosis not present

## 2020-11-08 DIAGNOSIS — Z79899 Other long term (current) drug therapy: Secondary | ICD-10-CM | POA: Diagnosis not present

## 2020-11-08 DIAGNOSIS — K3184 Gastroparesis: Secondary | ICD-10-CM | POA: Diagnosis not present

## 2020-11-08 DIAGNOSIS — I10 Essential (primary) hypertension: Secondary | ICD-10-CM | POA: Diagnosis not present

## 2020-11-08 DIAGNOSIS — R1013 Epigastric pain: Secondary | ICD-10-CM | POA: Diagnosis not present

## 2020-11-08 DIAGNOSIS — Z7984 Long term (current) use of oral hypoglycemic drugs: Secondary | ICD-10-CM | POA: Insufficient documentation

## 2020-11-08 LAB — COMPREHENSIVE METABOLIC PANEL
ALT: 36 U/L (ref 0–44)
AST: 31 U/L (ref 15–41)
Albumin: 4.2 g/dL (ref 3.5–5.0)
Alkaline Phosphatase: 88 U/L (ref 38–126)
Anion gap: 8 (ref 5–15)
BUN: 23 mg/dL — ABNORMAL HIGH (ref 6–20)
CO2: 25 mmol/L (ref 22–32)
Calcium: 9.5 mg/dL (ref 8.9–10.3)
Chloride: 107 mmol/L (ref 98–111)
Creatinine, Ser: 1.08 mg/dL — ABNORMAL HIGH (ref 0.44–1.00)
GFR, Estimated: >60 mL/min (ref >60)
Glucose, Bld: 123 mg/dL — ABNORMAL HIGH (ref 70–99)
Potassium: 4.2 mmol/L (ref 3.5–5.1)
Sodium: 140 mmol/L (ref 135–145)
Total Bilirubin: 0.5 mg/dL (ref 0.3–1.2)
Total Protein: 7.7 g/dL (ref 6.5–8.1)

## 2020-11-08 LAB — CBC
HCT: 47.4 % — ABNORMAL HIGH (ref 36.0–46.0)
Hemoglobin: 15.2 g/dL — ABNORMAL HIGH (ref 12.0–15.0)
MCH: 26.7 pg (ref 26.0–34.0)
MCHC: 32.1 g/dL (ref 30.0–36.0)
MCV: 83.3 fL (ref 80.0–100.0)
Platelets: 336 10*3/uL (ref 150–400)
RBC: 5.69 MIL/uL — ABNORMAL HIGH (ref 3.87–5.11)
RDW: 14.6 % (ref 11.5–15.5)
WBC: 7.7 10*3/uL (ref 4.0–10.5)
nRBC: 0 % (ref 0.0–0.2)

## 2020-11-08 LAB — LIPASE, BLOOD: Lipase: 30 U/L (ref 11–51)

## 2020-11-08 LAB — I-STAT BETA HCG BLOOD, ED (MC, WL, AP ONLY): I-stat hCG, quantitative: <5 m[IU]/mL (ref <5)

## 2020-11-08 MED ORDER — ONDANSETRON HCL 4 MG/2ML IJ SOLN
4.0000 mg | Freq: Once | INTRAMUSCULAR | Status: AC | PRN
  Administered 2020-11-08: 4 mg via INTRAVENOUS
  Filled 2020-11-08: qty 2

## 2020-11-08 MED ORDER — METOCLOPRAMIDE HCL 5 MG/ML IJ SOLN
10.0000 mg | Freq: Once | INTRAMUSCULAR | Status: AC
  Administered 2020-11-08: 10 mg via INTRAVENOUS
  Filled 2020-11-08: qty 2

## 2020-11-08 MED ORDER — MORPHINE SULFATE (PF) 4 MG/ML IV SOLN
4.0000 mg | Freq: Once | INTRAVENOUS | Status: AC
Start: 2020-11-08 — End: 2020-11-08
  Administered 2020-11-08: 4 mg via INTRAVENOUS
  Filled 2020-11-08: qty 1

## 2020-11-08 MED ORDER — LIDOCAINE VISCOUS HCL 2 % MT SOLN
15.0000 mL | Freq: Once | OROMUCOSAL | Status: AC
  Administered 2020-11-08: 15 mL via ORAL
  Filled 2020-11-08: qty 15

## 2020-11-08 MED ORDER — METOCLOPRAMIDE HCL 10 MG PO TABS: 10.0000 mg | ORAL_TABLET | Freq: Four times a day (QID) | ORAL | 0 refills | Status: DC

## 2020-11-08 MED ORDER — ALUM & MAG HYDROXIDE-SIMETH 200-200-20 MG/5ML PO SUSP
30.0000 mL | Freq: Once | ORAL | Status: AC
  Administered 2020-11-08: 30 mL via ORAL
  Filled 2020-11-08: qty 30

## 2020-11-08 MED ORDER — ALUMINUM-MAGNESIUM-SIMETHICONE 200-200-20 MG/5ML PO SUSP: 30.0000 mL | Freq: Three times a day (TID) | ORAL | 0 refills | Status: AC | PRN

## 2020-11-08 NOTE — ED Triage Notes (Addendum)
Patient works over at [employer], was brought over by co-worker in a wheelchair.  Started having mid-epigastric pain and nausea that started about an hour ago.  Patient when in the bathroom felt like she was going to pass out and broke out in a cold sweat.  Patient does have her gallbladder still. [facility].

## 2020-11-08 NOTE — ED Provider Notes (Signed)
[facility] DEPT Provider Note   CSN: [id number] Arrival date & time: 11/08/20  1034     History Chief Complaint  Patient presents with  . Abdominal Pain  . Nausea    Monique Wiggins is a 55 y.o. female.  HPI    55 year old female comes in a chief complaint of abdominal pain and vomiting. She has history of diabetes, prior history of H. pylori infection, recent diagnosis of gastroparesis.  Patient works over at the [employer], she was brought in to the ER with sudden onset of severe epigastric abdominal pain with associated nausea and vomiting.  Patient felt like she was going to faint in the bathroom.  She reports no history of similar pain in the past.  There is no history of abdominal pain postprandial, in fact patient just had needs yesterday and there was no abdominal pain.  The pain started 10 minutes prior to ED arrival.  Since arriving to the ER, pain has calmed down quite a bit, but is still there.  The pain was nonradiating.  Patient had associated diaphoresis, nausea, vomiting and felt like she was going to pass out.  Past Medical History:  Diagnosis Date  . Allergy   . Arthritis   . Back pain   . Carpal tunnel syndrome   . Diabetes mellitus without complication ([place])   . DVT (deep venous thrombosis) ([place])    after long car trip  . Dyspnea   . GERD (gastroesophageal reflux disease)   . H. pylori infection    1997  . Heart murmur   . Hypertension   . Hypertriglyceridemia 02/22/2017  . Hypertriglyceridemia 02/22/2017  . Migraines   . Sickle cell trait ([place])   . Sleep apnea    states she no longer has OSA lost weight    Patient Active Problem List   Diagnosis Date Noted  . Capsulitis of right shoulder 03/25/2020  . Back pain 01/08/2019  . Thumb pain, right 01/08/2019  . Obesity 10/12/2018  . Perimenopause 06/30/2018  . History of DVT (deep vein thrombosis) 06/30/2018  . Preventative health care 03/05/2018  .  Hypokalemia 03/05/2018  . Acid reflux 03/05/2018  . Diabetes mellitus without complication ([place])   . 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 Hyperlipidemia 02/22/2017    Past Surgical History:  Procedure Laterality Date  . ABDOMINAL HYSTERECTOMY     ovaries left in place  . CARPAL TUNNEL RELEASE Right 01/19/2019   Procedure: ENDOSCOPIC CARPAL TUNNEL RELEASE;  Surgeon: Milly Jakob, MD;  Location: [facility];  Service: Orthopedics;  Laterality: Right;  . CARPOMETACARPEL SUSPENSION PLASTY Right 01/19/2019   Procedure: RIGHT THUMB SUSPENSION ARTHROPLASTY;  Surgeon: Milly Jakob, MD;  Location: [facility];  Service: Orthopedics;  Laterality: Right;  PREOP BLOCK  . TOE SURGERY     left great toe, repair of severed tendon  . TONSILLECTOMY    . tummy tuck       OB History    Gravida  5   Para  5   Term      Preterm      AB      Living        SAB      IAB      Ectopic      Multiple      Live Births              Family History  Problem Relation Age  of Onset  . Diabetes type II Mother   . Hypertension Mother   . Diabetes Mother   . Diabetes Mellitus II Father   . Pulmonary fibrosis Father   . Diabetes Father   . Hypertension Father   . Pulmonary disease Father   . High Cholesterol Father   . Hypertension Sister   . Diabetes Sister   . Cancer Maternal Grandmother        colon  . Allergic Disorder Son   . Cancer Maternal Grandfather        lung, smoker  . Allergic Disorder Son   . Asthma Son   . Colon cancer Neg Hx   . Esophageal cancer Neg Hx   . Pancreatic cancer Neg Hx   . Stomach cancer Neg Hx   . Rectal cancer Neg Hx     Social History   Tobacco Use  . Smoking status: Never Smoker  . Smokeless tobacco: Never Used  Vaping Use  . Vaping Use: Never used  Substance Use Topics  . Alcohol use: No    Comment: never  . Drug use: No    Home Medications Prior to Admission  medications   Medication Sig Start Date End Date Taking? Authorizing Provider  aluminum-magnesium hydroxide-simethicone (MAALOX) 202-542-70 MG/5ML SUSP Take 30 mLs by mouth 3 (three) times daily with meals as needed. 11/08/20  Yes Varney Biles, MD  metoCLOPramide (REGLAN) 10 MG tablet Take 1 tablet (10 mg total) by mouth every 6 (six) hours. 11/08/20  Yes Varney Biles, MD  acetaminophen (TYLENOL) 325 MG tablet Take 2 tablets (650 mg total) by mouth every 6 (six) hours. 01/19/19   Milly Jakob, MD  albuterol (VENTOLIN HFA) 108 (90 Base) MCG/ACT inhaler Inhale 2 puffs into the lungs every 6 (six) hours as needed for wheezing or shortness of breath. 10/09/18   Mosie Lukes, MD  amLODipine (NORVASC) 5 MG tablet TAKE 1 TABLET BY MOUTH DAILY 11/24/19 11/23/20  Mosie Lukes, MD  atorvastatin (LIPITOR) 10 MG tablet TAKE 1 TABLET BY MOUTH DAILY 12/31/19 12/30/20  Mosie Lukes, MD  carvedilol (COREG) 25 MG tablet TAKE 1 TABLET BY MOUTH 2 TIMES DAILY WITH A MEAL. 07/26/20 07/26/21  Mosie Lukes, MD  cetirizine (ZYRTEC) 10 MG tablet Take 1 tablet (10 mg total) by mouth daily. 10/30/16   Debbrah Alar, NP  cyclobenzaprine (FLEXERIL) 10 MG tablet Take 0.5-1 tablets (5-10 mg total) by mouth 3 (three) times daily as needed for muscle spasms. 03/21/20   Shelda Pal, DO  dapagliflozin propanediol (FARXIGA) 10 MG TABS tablet TAKE 1 TABLET BY MOUTH ONCE A DAY BEFORE BREAKFAST 01/26/20 01/25/21  Mosie Lukes, MD  dicyclomine (BENTYL) 10 MG capsule Take 1 capsule (10 mg total) by mouth every 8 (eight) hours as needed for spasms. 07/12/20   Armbruster, Carlota Raspberry, MD  fluticasone (FLONASE) 50 MCG/ACT nasal spray Place 2 sprays into both nostrils daily. 08/22/18   Saguier, Percell Miller, PA-C  gabapentin (NEURONTIN) 300 MG capsule Take 1 capsule (300 mg total) by mouth 3 (three) times daily. 09/05/20   Shelda Pal, DO  gabapentin (NEURONTIN) 300 MG capsule TAKE 1 CAPSULE BY MOUTH AT BEDTIME 07/13/20  07/13/21    hydrochlorothiazide (HYDRODIURIL) 25 MG tablet TAKE 1 TABLET BY MOUTH DAILY * TAKE WITH LOSARTAN 02/26/20 02/25/21  Mosie Lukes, MD  linaclotide [facility]) 145 MCG CAPS capsule Take 1 capsule (145 mcg total) by mouth daily before breakfast. 08/01/20   Armbruster, Carlota Raspberry, MD  losartan (COZAAR) 100 MG tablet TAKE 1 TABLET BY MOUTH DAILY *TAKE WITH HCTZ 02/26/20 02/25/21  Mosie Lukes, MD  losartan-hydrochlorothiazide (HYZAAR) 100-25 MG tablet TAKE 1 TABLET BY MOUTH ONCE A DAY 02/26/20   Mosie Lukes, MD  meloxicam (MOBIC) 15 MG tablet TAKE 1 TABLET(15 MG) BY MOUTH DAILY 04/22/20   Nani Ravens, Crosby Oyster, DO  meloxicam (MOBIC) 15 MG tablet TAKE 1 TABLET BY MOUTH ONCE A DAY WITH MEALS 06/27/20 06/27/21    metFORMIN (GLUCOPHAGE) 500 MG tablet TAKE 1 TABLET BY MOUTH TWICE DAILY WITH A MEAL 07/28/19   Mosie Lukes, MD  mometasone (ELOCON) 0.1 % cream Apply 1 application topically daily. 07/23/17   Debbrah Alar, NP  neomycin-polymyxin-hydrocortisone (CORTISPORIN) OTIC solution Apply 1-2 drops to toes twice a day until all gone 04/26/18   Landis Martins, DPM  potassium chloride SA (KLOR-CON) 20 MEQ tablet TAKE 1 TABLET BY MOUTH ONCE DAILY 04/12/20 04/12/21  Shelda Pal, DO  topiramate (TOPAMAX) 50 MG tablet Take 1 tablet (50 mg total) by mouth daily. 10/10/20 10/10/21  Mosie Lukes, MD  tretinoin (RETIN-A) 0.025 % cream APPLY TO THE AFFECTED AREA(S) AT BEDTIME 06/17/18   Debbrah Alar, NP  valACYclovir (VALTREX) 1000 MG tablet Take 1 tablet (1,000 mg total) by mouth 3 (three) times daily. 08/23/20   Brunetta Jeans, PA-C  valACYclovir (VALTREX) 1000 MG tablet TAKE 1 TABLET (1,000 MG TOTAL) BY MOUTH 3 (THREE) TIMES DAILY. 08/23/20 08/23/21  Brunetta Jeans, PA-C    Allergies    Pollen extract and Shellfish allergy  Review of Systems   Review of Systems  Constitutional: Positive for activity change.  Respiratory: Negative for shortness of breath.   Cardiovascular:  Negative for chest pain.  Gastrointestinal: Positive for abdominal pain, nausea and vomiting.  All other systems reviewed and are negative.   Physical Exam Updated Vital Signs BP 134/79   Pulse 73   Temp (!) 97.2 F (36.2 C) (Oral)   Resp 14   Ht 5\' 1"  (1.549 m)   Wt 81.2 kg   SpO2 97%   BMI 33.82 kg/m   Physical Exam Vitals and nursing note reviewed.  Constitutional:      Appearance: She is well-developed.  HENT:     Head: Normocephalic and atraumatic.  Eyes:     Pupils: Pupils are equal, round, and reactive to light.  Cardiovascular:     Rate and Rhythm: Normal rate and regular rhythm.     Heart sounds: Normal heart sounds. No murmur heard.   Pulmonary:     Effort: Pulmonary effort is normal. No respiratory distress.  Abdominal:     General: There is no distension.     Palpations: Abdomen is soft.     Tenderness: There is abdominal tenderness in the epigastric area. There is no guarding or rebound. Negative signs include Murphy's sign and McBurney's sign.  Musculoskeletal:     Cervical back: Neck supple.  Skin:    General: Skin is warm and dry.  Neurological:     Mental Status: She is alert and oriented to person, place, and time.     ED Results / Procedures / Treatments   Labs (all labs ordered are listed, but only abnormal results are displayed) Labs Reviewed  CBC - Abnormal; Notable for the following components:      Result Value   RBC 5.69 (*)    Hemoglobin 15.2 (*)    HCT 47.4 (*)    All other components within  normal limits  COMPREHENSIVE METABOLIC PANEL - Abnormal; Notable for the following components:   Glucose, Bld 123 (*)    BUN 23 (*)    Creatinine, Ser 1.08 (*)    All other components within normal limits  LIPASE, BLOOD  URINALYSIS, ROUTINE W REFLEX MICROSCOPIC  I-STAT BETA HCG BLOOD, ED ([facility], [facility], [facility] ONLY)    EKG EKG Interpretation  Date/Time:  Tuesday Nov 08 2020 11:47:56 EDT Ventricular Rate:  67 PR Interval:  158 QRS  Duration: 87 QT Interval:  419 QTC Calculation: 443 R Axis:   38 Text Interpretation: Sinus rhythm Low voltage, precordial leads No acute changes No significant change since last tracing Confirmed by Varney Biles [phone]) on 11/08/2020 12:53:38 PM   Radiology No results found.  Procedures Procedures   Medications Ordered in ED Medications  alum & mag hydroxide-simeth (MAALOX/MYLANTA) 200-200-20 MG/5ML suspension 30 mL (has no administration in time range)    And  lidocaine (XYLOCAINE) 2 % viscous mouth solution 15 mL (has no administration in time range)  ondansetron (ZOFRAN) injection 4 mg (4 mg Intravenous Given 11/08/20 1117)  morphine 4 MG/ML injection 4 mg (4 mg Intravenous Given 11/08/20 1341)  metoCLOPramide (REGLAN) injection 10 mg (10 mg Intravenous Given 11/08/20 1342)    ED Course  I have reviewed the triage vital signs and the nursing notes.  Pertinent labs & imaging results that were available during my care of the patient were reviewed by me and considered in my medical decision making (see chart for details).    MDM Rules/Calculators/A&P                          DDx includes: Pancreatitis Hepatobiliary pathology including cholecystitis Gastritis/PUD SBO ACS syndrome Aortic Dissection  55 year old female comes in a chief complaint of sudden onset abdominal pain, with nausea, vomiting diaphoresis and near fainting.  It appears after talking to the patient, that the near fainting was likely vasovagal response to the severe abdominal pain.  The abdominal pain has calmed down quite a bit.  It is epigastric, nonradiating and on exam there is no right upper quadrant tenderness, no guarding at any area of the abdomen.  Patient has history of gastroparesis.  I have reviewed her endoscopy from earlier in the year.  Suspicion is high that her pain could have been because of some spasms/gastroparesis.  She is feeling a lot better right now.  We will check some basic  labs, get her symptoms in better control and reassess.  I do not see a need for [name] to get an ultrasound right upper quadrant at this time.  Ultrasound from 2020 was negative.  I will reassess the patient to see if she needs ultrasound.  Reassessment: Patient remains comfortable.  We will give her IV morphine at the request of the husband.  Oral challenge has been initiated.  Abdominal exam unchanged, no need for [name].  Patient is going to call her GI for follow-up.  Stable for discharge at this time. Clear liquid diet x 2 days recommended.  Final Clinical Impression(s) / ED Diagnoses Final diagnoses:  Epigastric abdominal pain  Gastroparesis    Rx / DC Orders ED Discharge Orders         Ordered    metoCLOPramide (REGLAN) 10 MG tablet  Every 6 hours        11/08/20 1509    aluminum-magnesium hydroxide-simethicone (MAALOX) 200-200-20 MG/5ML SUSP  3 times daily with meals PRN  11/08/20 [place], Zaara Sprowl, MD 11/08/20 1534

## 2020-11-08 NOTE — Discharge Instructions (Addendum)
We saw you in the ER for pain, nausea and vomiting.   At this time, we suspect that the symptoms were related to gastroparesis flare.  Your labs are reassuring, you have been had repeat bout of severe pain and vomiting.  We feel comfortable sending you home.  We recommend clear liquid diet for the next 2 days and then advance as tolerated. Return to the ER if your symptoms get worse.

## 2020-11-09 NOTE — Telephone Encounter (Signed)
Pt has an appointment tomorrow.

## 2020-11-09 NOTE — Telephone Encounter (Signed)
I called patient on 11/08/20 and left a message on her voice mail asking her to please return my call.  Patient's spouse returned my call and stated patient was en route to the ER and passed the phone to the patient so I could speak with her.  I explained to the patient I was one of the people she spoke with on Monday afternoon, 5/23, when [employer], [place], declined to see her as she was late for her appointment (past the 10 minute cut off time, registrar's computer time was 4:31pm, appointment time was 4:20 pm) and that we did in fact check with the provider after the 10 minute grace period.  I explained  it is then provider discretion if he/she will see the patient or not.  The provider declined to see the patient and requested she reschedule.  I explained this again to the patient as I did on Monday afternoon.  I did; however, explain Dr. Charlett Blake was able to see patient this Thursday, 5/26 at 1 pm and patient accepted that appointment time and I scheduled the patient to be seen by Dr. Charlett Blake on Thursday.

## 2020-11-10 ENCOUNTER — Other Ambulatory Visit: Payer: Self-pay

## 2020-11-10 ENCOUNTER — Other Ambulatory Visit: Payer: Self-pay | Admitting: Family Medicine

## 2020-11-10 ENCOUNTER — Ambulatory Visit: Payer: 59 | Admitting: Family Medicine

## 2020-11-10 ENCOUNTER — Encounter: Payer: Self-pay | Admitting: Family Medicine

## 2020-11-10 ENCOUNTER — Ambulatory Visit (HOSPITAL_BASED_OUTPATIENT_CLINIC_OR_DEPARTMENT_OTHER)
Admission: RE | Admit: 2020-11-10 | Discharge: 2020-11-10 | Disposition: A | Discharge status: Home / Self Care | Payer: 59 | Source: Ambulatory Visit | Attending: Family Medicine | Admitting: Family Medicine

## 2020-11-10 VITALS — BP 116/76 | HR 83 | Temp 98.3°F | Resp 16 | Wt 183.0 lb

## 2020-11-10 DIAGNOSIS — K769 Liver disease, unspecified: Secondary | ICD-10-CM

## 2020-11-10 DIAGNOSIS — R197 Diarrhea, unspecified: Secondary | ICD-10-CM | POA: Insufficient documentation

## 2020-11-10 DIAGNOSIS — R110 Nausea: Secondary | ICD-10-CM | POA: Insufficient documentation

## 2020-11-10 DIAGNOSIS — R109 Unspecified abdominal pain: Secondary | ICD-10-CM | POA: Insufficient documentation

## 2020-11-10 DIAGNOSIS — K219 Gastro-esophageal reflux disease without esophagitis: Secondary | ICD-10-CM

## 2020-11-10 DIAGNOSIS — E669 Obesity, unspecified: Secondary | ICD-10-CM | POA: Diagnosis not present

## 2020-11-10 DIAGNOSIS — E785 Hyperlipidemia, unspecified: Secondary | ICD-10-CM

## 2020-11-10 DIAGNOSIS — E119 Type 2 diabetes mellitus without complications: Secondary | ICD-10-CM | POA: Diagnosis not present

## 2020-11-10 NOTE — Progress Notes (Signed)
Patient ID: Monique Wiggins, female    DOB: August 04, 1965  Age: 55 y.o. MRN: [id number]    Subjective:  Subjective  HPI Kameron Quanesha Klimaszewski presents for office visit today for follow up on acid reflux and diabetes. She reports that she recently had an ER visit for excessive epigastric abdominal pain. She states that it might be her gall bladder acting up. She reports that she felt nausea, sweats, chills, cramping, and bloating secondary to her abdominal pain. She reports that for 3 days she did not eat anything but jello and fluids. She states that her symptoms are waxing and winning and reports that an episode lasts for about 2 hours.She denies any chest pain, SOB, fever, cough, sore throat, dysuria, urinary incontinence, back pain, HA, or V. She states that she was px Reglan and Maalox for her symptoms.   She reports that after her ER visits, she started experiencing diarrhea that has been ongoing for 2 days now. She states that before she experienced those complication leading to the ER visit, her baseline was some mild abdominal discomfort and states that she usually had acid reflux when eating greasy foods like pizza and eating sweets. She expresses concern regarding her recent vision changes and bilateral eyes bulging that might be secondary to her thyroid. She reports that she experiences double vision in her left eye and blurred vision in both eyes.   Review of Systems  Constitutional: Negative for chills, fatigue and fever.  HENT: Negative for congestion, rhinorrhea, sinus pressure, sinus pain and sore throat.   Eyes: Positive for visual disturbance (bilateral blurred vision, double vision in left eye). Negative for pain.  Respiratory: Negative for cough and shortness of breath.   Cardiovascular: Negative for chest pain, palpitations and leg swelling.  Gastrointestinal: Positive for abdominal pain, diarrhea and nausea. Negative for blood in stool and vomiting.  Genitourinary:  Negative for decreased urine volume, flank pain, frequency, vaginal bleeding and vaginal discharge.  Musculoskeletal: Negative for back pain.  Neurological: Negative for headaches.    History Past Medical History:  Diagnosis Date  . Allergy   . Arthritis   . Back pain   . Carpal tunnel syndrome   . Diabetes mellitus without complication ([place])   . DVT (deep venous thrombosis) ([place])    after long car trip  . Dyspnea   . GERD (gastroesophageal reflux disease)   . H. pylori infection    1997  . Heart murmur   . Hypertension   . Hypertriglyceridemia 02/22/2017  . Hypertriglyceridemia 02/22/2017  . Migraines   . Sickle cell trait ([place])   . Sleep apnea    states she no longer has OSA lost weight    She has a past surgical history that includes tummy tuck; Tonsillectomy; Toe Surgery; Abdominal hysterectomy; Carpometacarpel [facility]) suspension plasty (Right, 01/19/2019); and Carpal tunnel release (Right, 01/19/2019).   Her family history includes Allergic Disorder in her son and son; Asthma in her son; Cancer in her maternal grandfather and maternal grandmother; Diabetes in her father, mother, and sister; Diabetes Mellitus II in her father; Diabetes type II in her mother; High Cholesterol in her father; Hypertension in her father, mother, and sister; Pulmonary disease in her father; Pulmonary fibrosis in her father.She reports that she has never smoked. She has never used smokeless tobacco. She reports that she does not drink alcohol and does not use drugs.  Current Outpatient Medications on File Prior to Visit  Medication Sig Dispense Refill  . acetaminophen (TYLENOL) 325  MG tablet Take 2 tablets (650 mg total) by mouth every 6 (six) hours.    Marland Kitchen albuterol (VENTOLIN HFA) 108 (90 Base) MCG/ACT inhaler Inhale 2 puffs into the lungs every 6 (six) hours as needed for wheezing or shortness of breath. 1 Inhaler 2  . aluminum-magnesium hydroxide-simethicone (MAALOX) 384-665-99 MG/5ML SUSP Take 30 mLs by  mouth 3 (three) times daily with meals as needed. 355 mL 0  . amLODipine (NORVASC) 5 MG tablet TAKE 1 TABLET BY MOUTH DAILY 90 tablet 3  . atorvastatin (LIPITOR) 10 MG tablet TAKE 1 TABLET BY MOUTH DAILY 90 tablet 4  . carvedilol (COREG) 25 MG tablet TAKE 1 TABLET BY MOUTH 2 TIMES DAILY WITH A MEAL. 180 tablet 1  . cetirizine (ZYRTEC) 10 MG tablet Take 1 tablet (10 mg total) by mouth daily. 30 tablet 11  . cyclobenzaprine (FLEXERIL) 10 MG tablet Take 0.5-1 tablets (5-10 mg total) by mouth 3 (three) times daily as needed for muscle spasms. 21 tablet 0  . dapagliflozin propanediol (FARXIGA) 10 MG TABS tablet TAKE 1 TABLET BY MOUTH ONCE A DAY BEFORE BREAKFAST 30 tablet 6  . dicyclomine (BENTYL) 10 MG capsule Take 1 capsule (10 mg total) by mouth every 8 (eight) hours as needed for spasms. 30 capsule 1  . fluticasone (FLONASE) 50 MCG/ACT nasal spray Place 2 sprays into both nostrils daily. 16 g 1  . gabapentin (NEURONTIN) 300 MG capsule Take 1 capsule (300 mg total) by mouth 3 (three) times daily. 90 capsule 2  . gabapentin (NEURONTIN) 300 MG capsule TAKE 1 CAPSULE BY MOUTH AT BEDTIME 30 capsule 2  . hydrochlorothiazide (HYDRODIURIL) 25 MG tablet TAKE 1 TABLET BY MOUTH DAILY * TAKE WITH LOSARTAN 90 tablet 4  . linaclotide (LINZESS) 145 MCG CAPS capsule Take 1 capsule (145 mcg total) by mouth daily before breakfast. 30 capsule 2  . losartan (COZAAR) 100 MG tablet TAKE 1 TABLET BY MOUTH DAILY *TAKE WITH HCTZ 90 tablet 4  . losartan-hydrochlorothiazide (HYZAAR) 100-25 MG tablet TAKE 1 TABLET BY MOUTH ONCE A DAY 90 tablet 5  . meloxicam (MOBIC) 15 MG tablet TAKE 1 TABLET(15 MG) BY MOUTH DAILY 30 tablet 0  . meloxicam (MOBIC) 15 MG tablet TAKE 1 TABLET BY MOUTH ONCE A DAY WITH MEALS 30 tablet 2  . metFORMIN (GLUCOPHAGE) 500 MG tablet TAKE 1 TABLET BY MOUTH TWICE DAILY WITH A MEAL 180 tablet 1  . metoCLOPramide (REGLAN) 10 MG tablet Take 1 tablet (10 mg total) by mouth every 6 (six) hours. 30 tablet 0  .  mometasone (ELOCON) 0.1 % cream Apply 1 application topically daily. 45 g 2  . neomycin-polymyxin-hydrocortisone (CORTISPORIN) OTIC solution Apply 1-2 drops to toes twice a day until all gone 10 mL 0  . potassium chloride SA (KLOR-CON) 20 MEQ tablet TAKE 1 TABLET BY MOUTH ONCE DAILY 90 tablet 5  . topiramate (TOPAMAX) 50 MG tablet Take 1 tablet (50 mg total) by mouth daily. 90 tablet 1  . tretinoin (RETIN-A) 0.025 % cream APPLY TO THE AFFECTED AREA(S) AT BEDTIME 45 g 2  . valACYclovir (VALTREX) 1000 MG tablet Take 1 tablet (1,000 mg total) by mouth 3 (three) times daily. 21 tablet 0  . valACYclovir (VALTREX) 1000 MG tablet TAKE 1 TABLET (1,000 MG TOTAL) BY MOUTH 3 (THREE) TIMES DAILY. 21 tablet 0   No current facility-administered medications on file prior to visit.     Objective:  Objective  Physical Exam Constitutional:      General: She is not  in acute distress.    Appearance: Normal appearance. She is not ill-appearing or toxic-appearing.  HENT:     Head: Normocephalic and atraumatic.     Right Ear: Tympanic membrane, ear canal and external ear normal.     Left Ear: Tympanic membrane, ear canal and external ear normal.     Nose: No congestion or rhinorrhea.  Eyes:     Extraocular Movements: Extraocular movements intact.     Pupils: Pupils are equal, round, and reactive to light.  Cardiovascular:     Rate and Rhythm: Normal rate and regular rhythm.     Pulses: Normal pulses.     Heart sounds: Normal heart sounds. No murmur heard.   Pulmonary:     Effort: Pulmonary effort is normal. No respiratory distress.     Breath sounds: Normal breath sounds. No wheezing, rhonchi or rales.  Abdominal:     General: Bowel sounds are normal.     Palpations: Abdomen is soft. There is no mass.     Tenderness: There is no abdominal tenderness. There is no guarding.     Hernia: No hernia is present.     Comments: -tenderness to the epigastric region to the right under ribs   Musculoskeletal:         General: Normal range of motion.     Cervical back: Normal range of motion and neck supple.  Skin:    General: Skin is warm and dry.  Neurological:     Mental Status: She is alert and oriented to person, place, and time.  Psychiatric:        Behavior: Behavior normal.    BP 116/76   Pulse 83   Temp 98.3 F (36.8 C)   Resp 16   Wt 183 lb (83 kg)   SpO2 96%   BMI 34.58 kg/m  Wt Readings from Last 3 Encounters:  11/10/20 183 lb (83 kg)  11/08/20 179 lb (81.2 kg)  11/07/20 176 lb 3.2 oz (79.9 kg)     Lab Results  Component Value Date   WBC 7.4 11/10/2020   HGB 14.4 11/10/2020   HCT 44.2 11/10/2020   PLT 322.0 11/10/2020   GLUCOSE 198 (H) 11/10/2020   CHOL 106 11/10/2020   TRIG 234.0 (H) 11/10/2020   HDL 45.80 11/10/2020   LDLDIRECT 20.0 11/10/2020   LDLCALC 25 12/03/2019   ALT 36 (H) 11/10/2020   AST 18 11/10/2020   NA 140 11/10/2020   K 3.5 11/10/2020   CL 107 11/10/2020   CREATININE 0.98 11/10/2020   BUN 18 11/10/2020   CO2 25 11/10/2020   TSH 0.96 11/10/2020   HGBA1C 7.3 (H) 11/10/2020   MICROALBUR 3.9 (H) 05/20/2019    No results found.   Assessment & Plan:  Plan    No orders of the defined types were placed in this encounter.   Problem List Items Addressed This Visit    Hyperlipidemia    Encouraged heart healthy diet, increase exercise, avoid trans fats, consider a krill oil cap daily. On Atorvastatin      Relevant Orders   Lipid panel (Completed)   TSH (Completed)   Diabetes mellitus without complication ([place]) - Primary    hgba1c acceptable, minimize simple carbs. Increase exercise as tolerated. Continue current meds      Relevant Orders   Hemoglobin A1c (Completed)   TSH (Completed)   Acid reflux   Obesity    Maintain heart healthy diet, decrease po intake and increase exercise as tolerated. Needs  7-8 hours of sleep nightly. Avoid trans fats, eat small, frequent meals every 4-5 hours with lean proteins, complex carbs and healthy  fats. Minimize simple carbs      Diarrhea   Relevant Orders   US Abdomen Complete (Completed)   Nausea   Relevant Orders   US Abdomen Complete (Completed)   Abdominal pain    Mostly in epigastrium and right upper quadrant. She is set up for imaging Ultrasound. She will maintain a diet low in fat and plenty of fluids. Report worsening symptoms. Check labs      Relevant Orders   US Abdomen Complete (Completed)   CBC with Differential/Platelet (Completed)   Comprehensive metabolic panel (Completed)   Sedimentation rate (Completed)      Follow-up: No follow-ups on file.   I,David Hanna,acting as a scribe for Penni Homans, MD.,have documented all relevant documentation on the behalf of Penni Homans, MD,as directed by  Penni Homans, MD while in the presence of Penni Homans, MD.  I, Mosie Lukes, MD personally performed the services described in this documentation. All medical record entries made by the scribe were at my direction and in my presence. I have reviewed the chart and agree that the record reflects my personal performance and is accurate and complete

## 2020-11-10 NOTE — Patient Instructions (Signed)
Cholelithiasis  Cholelithiasis is a disease in which gallstones form in the gallbladder. The gallbladder is an organ that stores bile. Bile is a fluid that helps to digest fats. Gallstones begin as small crystals and can slowly grow into stones. They may cause no symptoms until they block the gallbladder duct, or cystic duct, when the gallbladder tightens (contracts) after food is eaten. This can cause pain and is known as a gallbladder attack, or biliary colic. There are two main types of gallstones:  Cholesterol stones. These are the most common type of gallstone. These stones are made of hardened cholesterol and are usually yellow-green in color. Cholesterol is a fat-like substance that is made in the liver.  Pigment stones. These are dark in color and are made of a red-yellow substance, called bilirubin,that forms when hemoglobin from red blood cells breaks down. What are the causes? This condition may be caused by an imbalance in the different parts that make bile. This can happen if the bile:  Has too much bilirubin. This can happen in certain blood diseases, such as sickle cell anemia.  Has too much cholesterol.  Does not have enough bile salts. These salts help the body absorb and digest fats. In some cases, this condition can also be caused by the gallbladder not emptying completely or often enough. This is common during pregnancy. What increases the risk? The following factors may make you more likely to develop this condition:  Being female.  Having multiple pregnancies. Health care providers sometimes advise removing diseased gallbladders before future pregnancies.  Eating a diet that is heavy in fried foods, fat, and refined carbohydrates, such as white bread and white rice.  Being obese.  Being older than age 40.  Using medicines that contain female hormones (estrogen) for a long time.  Losing weight quickly.  Having a family history of gallstones.  Having certain  medical problems, such as: ? Diabetes mellitus. ? Cystic fibrosis. ? Crohn's disease. ? Cirrhosis or other long-term (chronic) liver disease. ? Certain blood diseases, such as sickle cell anemia or leukemia. What are the signs or symptoms? In many cases, having gallstones causes no symptoms. When you have gallstones but do not have symptoms, you have silent gallstones. If a gallstone blocks your bile duct, it can cause a gallbladder attack. The main symptom of a gallbladder attack is sudden pain in the upper right part of the abdomen. The pain:  Usually comes at night or after eating.  Can last for one hour or more.  Can spread to your right shoulder, back, or chest.  Can feel like indigestion. This is discomfort, burning, or fullness in your upper abdomen. If the bile duct is blocked for more than a few hours, it can cause an infection or inflammation of your gallbladder (cholecystitis), liver, or pancreas. This can cause:  Nausea or vomiting.  Bloating.  Pain in your abdomen that lasts for 5 hours or longer.  Tenderness in your upper abdomen, often in the upper right section and under your rib cage.  Fever or chills.  Skin or the white parts of your eyes turning yellow (jaundice). This usually happens when a stone has blocked bile from passing through the common bile duct.  Dark urine or light-colored stools. How is this diagnosed? This condition may be diagnosed based on:  A physical exam.  Your medical history.  Ultrasound.  CT scan.  MRI. You may also have other tests, including:  Blood tests to check for signs of an   infection or inflammation.  Cholescintigraphy, or HIDA scan. This is a scan of your gallbladder and bile ducts (biliary system) using non-harmful radioactive material and special cameras that can see the radioactive material.  Endoscopic retrograde cholangiopancreatogram. This involves inserting a small tube with a camera on the end (endoscope)  through your mouth to look at bile ducts and check for blockages. How is this treated? Treatment for this condition depends on the severity of the condition. Silent gallstones do not need treatment. Treatment may be needed if a blockage causes a gallbladder attack or other symptoms. Treatment may include:  Home care, if symptoms are not severe. ? During a simple gallbladder attack, stop eating and drinking for 12-24 hours (except for water and clear liquids). This helps to "cool down" your gallbladder. After 1 or 2 days, you can start to eat a diet of simple or clear foods, such as broths and crackers. ? You may also need medicines for pain or nausea or both. ? If you have cholecystitis and an infection, you will need antibiotics.  A hospital stay, if needed for pain control or for cholecystitis with severe infection.  Cholecystectomy, or surgery to remove your gallbladder. This is the most common treatment if all other treatments have not worked.  Medicines to break up gallstones. These are most effective at treating small gallstones. Medicines may be used for up to 6-12 months.  Endoscopic retrograde cholangiopancreatogram. A small basket can be attached to the endoscope and used to capture and remove gallstones, mainly those that are in the common bile duct. Follow these instructions at home: Medicines  Take over-the-counter and prescription medicines only as told by your health care provider.  If you were prescribed an antibiotic medicine, take it as told by your health care provider. Do not stop taking the antibiotic even if you start to feel better.  Ask your health care provider if the medicine prescribed to you requires you to avoid driving or using machinery. Eating and drinking  Drink enough fluid to keep your urine pale yellow. This is important during a gallbladder attack. Water and clear liquids are preferred.  Follow a healthy diet. This includes: ? Reducing fatty foods,  such as fried food and foods high in cholesterol. ? Reducing refined carbohydrates, such as white bread and white rice. ? Eating more fiber. Aim for foods such as almonds, fruit, and beans. Alcohol use  If you drink alcohol: ? Limit how much you use to:  0-1 drink a day for nonpregnant women.  0-2 drinks a day for men. ? Be aware of how much alcohol is in your drink. In the [place] one drink equals one 12 oz bottle of beer (355 mL), one 5 oz glass of wine (148 mL), or one 1 oz glass of hard liquor (44 mL). General instructions  Do not use any products that contain nicotine or tobacco, such as cigarettes, e-cigarettes, and chewing tobacco. If you need help quitting, ask your health care provider.  Maintain a healthy weight.  Keep all follow-up visits as told by your health care provider. These may include consultations with a surgeon or specialist. This is important. Where to find more information  National Institute of Diabetes and Digestive and Kidney Diseases: www.niddk.nih.gov Contact a health care provider if:  You think you have had a gallbladder attack.  You have been diagnosed with silent gallstones and you develop pain in your abdomen or indigestion.  You begin to have attacks more often.  You have   dark urine or light-colored stools. Get help right away if:  You have pain from a gallbladder attack that lasts for more than 2 hours.  You have pain in your abdomen that lasts for more than 5 hours or is getting worse.  You have a fever or chills.  You have nausea and vomiting that do not go away.  You develop jaundice. Summary  Cholelithiasis is a disease in which gallstones form in the gallbladder.  This condition may be caused by an imbalance in the different parts that make bile. This can happen if your bile has too much bilirubin or cholesterol, or does not have enough bile salts.  Treatment for gallstones depends on the severity of the condition. Silent  gallstones do not need treatment.  If gallstones cause a gallbladder attack or other symptoms, treatment usually involves not eating or drinking anything. Treatment may also include pain medicines and antibiotics, and it sometimes includes a hospital stay.  Surgery to remove the gallbladder is common if all other treatments have not worked. This information is not intended to replace advice given to you by your health care provider. Make sure you discuss any questions you have with your health care provider. Document Revised: 04/27/2019 Document Reviewed: 04/27/2019 Elsevier Patient Education  2021 [employer]vier Inc.  

## 2020-11-11 ENCOUNTER — Telehealth: Payer: Self-pay | Admitting: Family Medicine

## 2020-11-11 LAB — SEDIMENTATION RATE: Sed Rate: 6 mm/hr (ref 0–30)

## 2020-11-11 LAB — LIPID PANEL
Cholesterol: 106 mg/dL (ref 0–200)
HDL: 45.8 mg/dL (ref >39.00)
NonHDL: 60.52
Total CHOL/HDL Ratio: 2
Triglycerides: 234 mg/dL — ABNORMAL HIGH (ref 0.0–149.0)
VLDL: 46.8 mg/dL — ABNORMAL HIGH (ref 0.0–40.0)

## 2020-11-11 LAB — COMPREHENSIVE METABOLIC PANEL
ALT: 36 U/L — ABNORMAL HIGH (ref 0–35)
AST: 18 U/L (ref 0–37)
Albumin: 4.4 g/dL (ref 3.5–5.2)
Alkaline Phosphatase: 91 U/L (ref 39–117)
BUN: 18 mg/dL (ref 6–23)
CO2: 25 mEq/L (ref 19–32)
Calcium: 9.2 mg/dL (ref 8.4–10.5)
Chloride: 107 mEq/L (ref 96–112)
Creatinine, Ser: 0.98 mg/dL (ref 0.40–1.20)
GFR: 64.71 mL/min (ref >60.00)
Glucose, Bld: 198 mg/dL — ABNORMAL HIGH (ref 70–99)
Potassium: 3.5 mEq/L (ref 3.5–5.1)
Sodium: 140 mEq/L (ref 135–145)
Total Bilirubin: 0.4 mg/dL (ref 0.2–1.2)
Total Protein: 6.9 g/dL (ref 6.0–8.3)

## 2020-11-11 LAB — CBC WITH DIFFERENTIAL/PLATELET
Basophils Absolute: 0.1 10*3/uL (ref 0.0–0.1)
Basophils Relative: 1.6 % (ref 0.0–3.0)
Eosinophils Absolute: 0.2 10*3/uL (ref 0.0–0.7)
Eosinophils Relative: 2.4 % (ref 0.0–5.0)
HCT: 44.2 % (ref 36.0–46.0)
Hemoglobin: 14.4 g/dL (ref 12.0–15.0)
Lymphocytes Relative: 24.4 % (ref 12.0–46.0)
Lymphs Abs: 1.8 10*3/uL (ref 0.7–4.0)
MCHC: 32.6 g/dL (ref 30.0–36.0)
MCV: 83.2 fl (ref 78.0–100.0)
Monocytes Absolute: 0.5 10*3/uL (ref 0.1–1.0)
Monocytes Relative: 6.7 % (ref 3.0–12.0)
Neutro Abs: 4.8 10*3/uL (ref 1.4–7.7)
Neutrophils Relative %: 64.9 % (ref 43.0–77.0)
Platelets: 322 10*3/uL (ref 150.0–400.0)
RBC: 5.32 Mil/uL — ABNORMAL HIGH (ref 3.87–5.11)
RDW: 14.3 % (ref 11.5–15.5)
WBC: 7.4 10*3/uL (ref 4.0–10.5)

## 2020-11-11 LAB — HEMOGLOBIN A1C: Hgb A1c MFr Bld: 7.3 % — ABNORMAL HIGH (ref 4.6–6.5)

## 2020-11-11 LAB — TSH: TSH: 0.96 u[IU]/mL (ref 0.35–4.50)

## 2020-11-11 LAB — LDL CHOLESTEROL, DIRECT: Direct LDL: 20 mg/dL

## 2020-11-11 NOTE — Telephone Encounter (Signed)
Called imaging. Patient was scheduled for 1st available

## 2020-11-11 NOTE — Telephone Encounter (Signed)
Patient states she would like Shaarika to call her back

## 2020-11-11 NOTE — Telephone Encounter (Signed)
Spoke with pt and she is aware of [name] results. Pt would like to know if we could get her scheduled for the MRI before this weekend is over.

## 2020-11-14 DIAGNOSIS — R109 Unspecified abdominal pain: Secondary | ICD-10-CM | POA: Insufficient documentation

## 2020-11-14 DIAGNOSIS — R197 Diarrhea, unspecified: Secondary | ICD-10-CM | POA: Insufficient documentation

## 2020-11-14 DIAGNOSIS — R110 Nausea: Secondary | ICD-10-CM | POA: Insufficient documentation

## 2020-11-14 NOTE — Assessment & Plan Note (Signed)
Encouraged heart healthy diet, increase exercise, avoid trans fats, consider a krill oil cap daily. On Atorvastatin

## 2020-11-14 NOTE — Assessment & Plan Note (Addendum)
Mostly in epigastrium and right upper quadrant. She is set up for imaging Ultrasound. She will maintain a diet low in fat and plenty of fluids. Report worsening symptoms. Check labs

## 2020-11-14 NOTE — Assessment & Plan Note (Signed)
hgba1c acceptable, minimize simple carbs. Increase exercise as tolerated. Continue current meds 

## 2020-11-14 NOTE — Assessment & Plan Note (Addendum)
Maintain heart healthy diet, decrease po intake and increase exercise as tolerated. Needs 7-8 hours of sleep nightly. Avoid trans fats, eat small, frequent meals every 4-5 hours with lean proteins, complex carbs and healthy fats. Minimize simple carbs

## 2020-11-15 ENCOUNTER — Ambulatory Visit: Payer: 59

## 2020-11-15 ENCOUNTER — Other Ambulatory Visit: Payer: Self-pay

## 2020-11-15 DIAGNOSIS — D1809 Hemangioma of other sites: Secondary | ICD-10-CM | POA: Diagnosis not present

## 2020-11-15 DIAGNOSIS — N281 Cyst of kidney, acquired: Secondary | ICD-10-CM | POA: Diagnosis not present

## 2020-11-15 DIAGNOSIS — D7389 Other diseases of spleen: Secondary | ICD-10-CM | POA: Diagnosis not present

## 2020-11-15 DIAGNOSIS — K769 Liver disease, unspecified: Secondary | ICD-10-CM

## 2020-11-15 DIAGNOSIS — K760 Fatty (change of) liver, not elsewhere classified: Secondary | ICD-10-CM | POA: Diagnosis not present

## 2020-11-15 MED ORDER — GADOBUTROL 1 MMOL/ML IV SOLN
8.0000 mL | Freq: Once | INTRAVENOUS | Status: AC | PRN
  Administered 2020-11-15: 8 mL via INTRAVENOUS

## 2020-11-16 ENCOUNTER — Other Ambulatory Visit: Payer: Self-pay

## 2020-11-16 DIAGNOSIS — K829 Disease of gallbladder, unspecified: Secondary | ICD-10-CM

## 2020-11-21 ENCOUNTER — Other Ambulatory Visit: Payer: Self-pay | Admitting: Family Medicine

## 2020-11-21 ENCOUNTER — Encounter: Payer: Self-pay | Admitting: Family Medicine

## 2020-11-21 ENCOUNTER — Other Ambulatory Visit (HOSPITAL_COMMUNITY): Payer: Self-pay

## 2020-11-21 DIAGNOSIS — E119 Type 2 diabetes mellitus without complications: Secondary | ICD-10-CM

## 2020-11-21 MED FILL — Carvedilol Tab 25 MG: ORAL | 90 days supply | Qty: 180 | Fill #0 | Status: AC

## 2020-11-21 MED FILL — Amlodipine Besylate Tab 5 MG (Base Equivalent): ORAL | 90 days supply | Qty: 90 | Fill #0 | Status: AC

## 2020-11-21 MED FILL — Potassium Chloride Microencapsulated Crys ER Tab 20 mEq: ORAL | 90 days supply | Qty: 90 | Fill #0 | Status: AC

## 2020-11-21 MED FILL — Hydrochlorothiazide Tab 25 MG: ORAL | 90 days supply | Qty: 90 | Fill #0 | Status: AC

## 2020-11-22 ENCOUNTER — Other Ambulatory Visit: Payer: Self-pay

## 2020-11-22 ENCOUNTER — Other Ambulatory Visit (HOSPITAL_COMMUNITY): Payer: Self-pay

## 2020-11-22 MED ORDER — DAPAGLIFLOZIN PROPANEDIOL 10 MG PO TABS
ORAL_TABLET | ORAL | 6 refills | Status: DC
  Filled 2020-11-22: qty 30, 30d supply, fill #0
  Filled 2021-01-10: qty 30, 30d supply, fill #1
  Filled 2021-02-24: qty 30, 30d supply, fill #2
  Filled 2021-04-04: qty 30, 30d supply, fill #3
  Filled 2021-05-04: qty 30, 30d supply, fill #4

## 2020-11-22 MED FILL — Gabapentin Cap 300 MG: ORAL | 30 days supply | Qty: 30 | Fill #0 | Status: AC

## 2020-11-22 NOTE — Telephone Encounter (Signed)
Looks like Dr. Nani Ravens gave this to here when she had shingles and it was for TID.  Is she continuing this?

## 2020-11-23 ENCOUNTER — Other Ambulatory Visit: Payer: Self-pay | Admitting: Family Medicine

## 2020-11-23 ENCOUNTER — Other Ambulatory Visit (HOSPITAL_COMMUNITY): Payer: Self-pay

## 2020-11-24 ENCOUNTER — Other Ambulatory Visit (HOSPITAL_COMMUNITY): Payer: Self-pay

## 2020-11-24 ENCOUNTER — Other Ambulatory Visit: Payer: Self-pay

## 2020-11-24 DIAGNOSIS — E119 Type 2 diabetes mellitus without complications: Secondary | ICD-10-CM

## 2020-11-24 MED ORDER — METFORMIN HCL 500 MG PO TABS
1.0000 | ORAL_TABLET | Freq: Two times a day (BID) | ORAL | 1 refills | Status: DC
  Filled 2020-11-24: qty 180, 90d supply, fill #0
  Filled 2021-02-24: qty 180, 90d supply, fill #1

## 2020-11-25 ENCOUNTER — Other Ambulatory Visit (HOSPITAL_COMMUNITY): Payer: Self-pay

## 2020-12-21 ENCOUNTER — Encounter: Payer: Self-pay | Admitting: Gastroenterology

## 2020-12-21 ENCOUNTER — Ambulatory Visit: Payer: 59 | Admitting: Gastroenterology

## 2020-12-21 VITALS — BP 136/86 | HR 57 | Ht 61.75 in | Wt 182.0 lb

## 2020-12-21 DIAGNOSIS — R1011 Right upper quadrant pain: Secondary | ICD-10-CM | POA: Diagnosis not present

## 2020-12-21 DIAGNOSIS — R1013 Epigastric pain: Secondary | ICD-10-CM | POA: Diagnosis not present

## 2020-12-21 DIAGNOSIS — R110 Nausea: Secondary | ICD-10-CM | POA: Diagnosis not present

## 2020-12-21 NOTE — Patient Instructions (Signed)
Referral placed to [facility].  You have been scheduled for a HIDA scan at [facility] (1st floor) on Wednesday 01/04/21. Please arrive 15 minutes prior to your scheduled appointment at  7:82 am. Make certain not to have anything to eat or drink at least 6 hours prior to your test. Should this appointment date or time not work well for you, please call radiology scheduling at [phone].  _____________________________________________________________________ hepatobiliary (HIDA) scan is an imaging procedure used to diagnose problems in the liver, gallbladder and bile ducts. In the HIDA scan, a radioactive chemical or tracer is injected into a vein in your arm. The tracer is handled by the liver like bile. Bile is a fluid produced and excreted by your liver that helps your digestive system break down fats in the foods you eat. Bile is stored in your gallbladder and the gallbladder releases the bile when you eat a meal. A special nuclear medicine scanner (gamma camera) tracks the flow of the tracer from your liver into your gallbladder and small intestine.  During your HIDA scan  You'll be asked to change into a hospital gown before your HIDA scan begins. Your health care team will position you on a table, usually on your back. The radioactive tracer is then injected into a vein in your arm.The tracer travels through your bloodstream to your liver, where it's taken up by the bile-producing cells. The radioactive tracer travels with the bile from your liver into your gallbladder and through your bile ducts to your small intestine.You may feel some pressure while the radioactive tracer is injected into your vein. As you lie on the table, a special gamma camera is positioned over your abdomen taking pictures of the tracer as it moves through your body. The gamma camera takes pictures continually for about an hour. You'll need to keep still during the HIDA scan. This can become  uncomfortable, but you may find that you can lessen the discomfort by taking deep breaths and thinking about other things. Tell your health care team if you're uncomfortable. The radiologist will watch on a computer the progress of the radioactive tracer through your body. The HIDA scan may be stopped when the radioactive tracer is seen in the gallbladder and enters your small intestine. This typically takes about an hour. In some cases extra imaging will be performed if original images aren't satisfactory, if morphine is given to help visualize the gallbladder or if the medication CCK is given to look at the contraction of the gallbladder. This test typically takes 2 hours to complete. _________________________________________________________

## 2020-12-21 NOTE — Progress Notes (Signed)
12/21/2020 Monique Wiggins [id number] [birth year]   HISTORY OF PRESENT ILLNESS: This is a 55 year old female who is a patient of Dr. Doyne Keel.  She has a history of diabetes, back pain, and GERD.  Has suspected gastroparesis and takes Reglan 2-3 times daily.  Also has chronic constipation for which she uses Linzess daily.  She is here today with her husband.  She tells me that about 6 or 7 weeks ago she had what she thinks was a gallbladder attack.  She developed severe upper abdominal pain.  Ended up going to the ER where labs were unremarkable.  They gave her fluids and pain medications as well as a GI cocktail.  She says that none of it helped, but eventually the pain just eased up.  She said the pain was across her upper/mid abdomen and would come in waves.  She said it was worse than having 5 kids.  She had up seeing her PCP the next day and had an ultrasound and then had a follow-up MRI as well.  She was told that her symptoms sounded gallbladder related and she needed to see GI to have a HIDA scan performed.  She tells me that she has been eating a bland diet so has not had any of the severe pain, but has continued to have some lesser pain in her upper abdomen.  Also has pretty frequent nausea.  She says it just does not feel like her gastroparesis, but they kept trying to blame it on that when she was in the ER.  EGD 06/2020:  - Esophagogastric landmarks identified. - Normal esophagus otherwise - Diffusely granular gastric mucosa with patchy erythema. Biopsied to rule out H pylori. - Normal stomach otherwise - Nodule found in the duodenum, suspect could be prominent / normal variant ampulla. Biopsied. - Normal duodenum otherwise  1. Surgical [P], duodenal nodule - PEPTIC DUODENITIS. - NO DYSPLASIA OR MALIGNANCY. 2. Surgical [P], gastric antrum and gastric body - REACTIVE GASTROPATHY. [place] IS NEGATIVE FOR HELICOBACTER PYLORI. - NO INTESTINAL METAPLASIA,  DYSPLASIA, OR MALIGNANCY  MRI of the abdomen 11/15/20:  IMPRESSION: 1. Moderate hepatic steatosis with areas of focal fatty sparing along the gallbladder fossa which correspond with lesions seen on prior ultrasound. No suspicious hepatic lesion. 2. Benign splenic hemangiomas. 3. Small bilateral renal cysts.   Past Medical History:  Diagnosis Date   Allergy    Arthritis    Back pain    Carpal tunnel syndrome    Diabetes mellitus without complication ([place])    DVT (deep venous thrombosis) ([facility])    after long car trip   Dyspnea    GERD (gastroesophageal reflux disease)    H. pylori infection    1997   Heart murmur    Hypertension    Hypertriglyceridemia 02/22/2017   Hypertriglyceridemia 02/22/2017   Migraines    Sickle cell trait ([place])    Sleep apnea    states she no longer has OSA lost weight   Past Surgical History:  Procedure Laterality Date   ABDOMINAL HYSTERECTOMY     ovaries left in place   CARPAL TUNNEL RELEASE Right 01/19/2019   Procedure: ENDOSCOPIC CARPAL TUNNEL RELEASE;  Surgeon: Milly Jakob, MD;  Location: [facility];  Service: Orthopedics;  Laterality: Right;   CARPOMETACARPEL SUSPENSION PLASTY Right 01/19/2019   Procedure: RIGHT THUMB SUSPENSION ARTHROPLASTY;  Surgeon: Milly Jakob, MD;  Location: [facility];  Service: Orthopedics;  Laterality: Right;  PREOP BLOCK  TOE SURGERY     left great toe, repair of severed tendon   TONSILLECTOMY     tummy tuck      reports that she has never smoked. She has never used smokeless tobacco. She reports that she does not drink alcohol and does not use drugs. family history includes Allergic Disorder in her son and son; Asthma in her son; Cancer in her maternal grandfather and maternal grandmother; Diabetes in her father, mother, and sister; Diabetes Mellitus II in her father; Diabetes type II in her mother; High Cholesterol in her father; Hypertension in her father, mother, and sister;  Pulmonary disease in her father; Pulmonary fibrosis in her father. Allergies  Allergen Reactions   Pollen Extract Swelling   Shellfish Allergy Swelling      Outpatient Encounter Medications as of 12/21/2020  Medication Sig   acetaminophen (TYLENOL) 325 MG tablet Take 2 tablets (650 mg total) by mouth every 6 (six) hours.   albuterol (VENTOLIN HFA) 108 (90 Base) MCG/ACT inhaler Inhale 2 puffs into the lungs every 6 (six) hours as needed for wheezing or shortness of breath.   aluminum-magnesium hydroxide-simethicone (MAALOX) 903-009-23 MG/5ML SUSP Take 30 mLs by mouth 3 (three) times daily with meals as needed.   amLODipine (NORVASC) 5 MG tablet TAKE 1 TABLET BY MOUTH DAILY   atorvastatin (LIPITOR) 10 MG tablet TAKE 1 TABLET BY MOUTH DAILY   carvedilol (COREG) 25 MG tablet TAKE 1 TABLET BY MOUTH 2 TIMES DAILY WITH A MEAL.   cetirizine (ZYRTEC) 10 MG tablet Take 1 tablet (10 mg total) by mouth daily.   cyclobenzaprine (FLEXERIL) 10 MG tablet Take 0.5-1 tablets (5-10 mg total) by mouth 3 (three) times daily as needed for muscle spasms.   dapagliflozin propanediol (FARXIGA) 10 MG TABS tablet TAKE 1 TABLET BY MOUTH ONCE A DAY BEFORE BREAKFAST   dicyclomine (BENTYL) 10 MG capsule Take 1 capsule (10 mg total) by mouth every 8 (eight) hours as needed for spasms.   fluticasone (FLONASE) 50 MCG/ACT nasal spray Place 2 sprays into both nostrils daily.   gabapentin (NEURONTIN) 300 MG capsule Take 1 capsule (300 mg total) by mouth 3 (three) times daily.   linaclotide (LINZESS) 145 MCG CAPS capsule Take 1 capsule (145 mcg total) by mouth daily before breakfast.   losartan-hydrochlorothiazide (HYZAAR) 100-25 MG tablet TAKE 1 TABLET BY MOUTH ONCE A DAY   meloxicam (MOBIC) 15 MG tablet TAKE 1 TABLET(15 MG) BY MOUTH DAILY   metFORMIN (GLUCOPHAGE) 500 MG tablet Take 1 tablet (500 mg total) by mouth 2 (two) times daily with a meal.   metoCLOPramide (REGLAN) 10 MG tablet Take 1 tablet (10 mg total) by mouth every  6 (six) hours.   mometasone (ELOCON) 0.1 % cream Apply 1 application topically daily.   phentermine 37.5 MG capsule Take 37.5 mg by mouth as directed.   potassium chloride SA (KLOR-CON) 20 MEQ tablet TAKE 1 TABLET BY MOUTH ONCE DAILY   topiramate (TOPAMAX) 50 MG tablet Take 1 tablet (50 mg total) by mouth daily.   tretinoin (RETIN-A) 0.025 % cream APPLY TO THE AFFECTED AREA(S) AT BEDTIME   [DISCONTINUED] gabapentin (NEURONTIN) 300 MG capsule TAKE 1 CAPSULE BY MOUTH AT BEDTIME   [DISCONTINUED] gabapentin (NEURONTIN) 300 MG capsule TAKE 1 CAPSULE BY MOUTH AT BEDTIME (Patient not taking: Reported on 12/21/2020)   [DISCONTINUED] hydrochlorothiazide (HYDRODIURIL) 25 MG tablet TAKE 1 TABLET BY MOUTH DAILY * TAKE WITH LOSARTAN (Patient not taking: Reported on 12/21/2020)   [DISCONTINUED] losartan (COZAAR) 100 MG  tablet TAKE 1 TABLET BY MOUTH DAILY *TAKE WITH HCTZ (Patient not taking: Reported on 12/21/2020)   [DISCONTINUED] meloxicam (MOBIC) 15 MG tablet TAKE 1 TABLET BY MOUTH ONCE A DAY WITH MEALS (Patient not taking: Reported on 12/21/2020)   [DISCONTINUED] neomycin-polymyxin-hydrocortisone (CORTISPORIN) OTIC solution Apply 1-2 drops to toes twice a day until all gone (Patient not taking: Reported on 12/21/2020)   [DISCONTINUED] valACYclovir (VALTREX) 1000 MG tablet Take 1 tablet (1,000 mg total) by mouth 3 (three) times daily.   [DISCONTINUED] valACYclovir (VALTREX) 1000 MG tablet TAKE 1 TABLET (1,000 MG TOTAL) BY MOUTH 3 (THREE) TIMES DAILY. (Patient not taking: Reported on 12/21/2020)   No facility-administered encounter medications on file as of 12/21/2020.     REVIEW OF SYSTEMS  : All other systems reviewed and negative except where noted in the History of Present Illness.   PHYSICAL EXAM: BP 136/86 (BP Location: Left Arm, Patient Position: Sitting, Cuff Size: Normal)   Pulse (!) 57   Ht 5' 1.75" (1.568 m)   Wt 182 lb (82.6 kg)   SpO2 100%   BMI 33.56 kg/m  General: Well developed AA female in no  acute distress Head: Normocephalic and atraumatic Eyes:  Sclerae anicteric, conjunctiva pink. Ears: Normal auditory acuity Lungs: Clear throughout to auscultation; no W/R/R. Heart: Regular rate and rhythm; no M/R/G. Abdomen: Soft, non-distended.  BS present.  RUQ and epigastric TTP. Musculoskeletal: Symmetrical with no gross deformities  Skin: No lesions on visible extremities Extremities: No edema  Neurological: Alert oriented x 4, grossly non-focal Psychological:  Alert and cooperative. Normal mood and affect  ASSESSMENT AND PLAN: *55 year old female with an episode of upper abdominal pain that she said it was worse and having 5 babies.  Has had ongoing pain although too much milder degree for the past several weeks while following a bland diet.  Has had ongoing nausea as well.  Has history of gastroparesis and takes Reglan 2-3 times daily.  Recent ultrasound and MRI unremarkable.  She was told that she needed to have a HIDA scan performed, but needed to see GI for that.  She has now been waiting for this appointment for the past several weeks in order to have [name] order HIDA scan.  We will proceed with HIDA scan with [facility].  We will also place a referral to [facility] due to suspicion for gallbladder source and hopefully able to expedite her care since this has been somewhat delayed.  In the interim she can try heating pad, Tylenol, Bentyl (which she already has at home), and continue low fat/bland diet.   CC:  Mosie Lukes, MD

## 2020-12-21 NOTE — Progress Notes (Signed)
Agree with assessment and plan as outlined.  

## 2020-12-27 ENCOUNTER — Encounter (HOSPITAL_COMMUNITY): Payer: Self-pay

## 2020-12-27 ENCOUNTER — Other Ambulatory Visit: Payer: Self-pay

## 2020-12-27 ENCOUNTER — Emergency Department (HOSPITAL_COMMUNITY)
Admission: EM | Admit: 2020-12-27 | Discharge: 2020-12-28 | Disposition: A | Discharge status: Home / Self Care | Payer: 59 | Attending: Emergency Medicine | Admitting: Emergency Medicine

## 2020-12-27 ENCOUNTER — Encounter: Payer: Self-pay | Admitting: Internal Medicine

## 2020-12-27 ENCOUNTER — Telehealth (INDEPENDENT_AMBULATORY_CARE_PROVIDER_SITE_OTHER): Payer: 59 | Admitting: Internal Medicine

## 2020-12-27 VITALS — BP 119/81 | HR 72 | Temp 101.2°F | Ht 61.75 in | Wt 178.0 lb

## 2020-12-27 DIAGNOSIS — U071 COVID-19: Secondary | ICD-10-CM | POA: Insufficient documentation

## 2020-12-27 DIAGNOSIS — Z79899 Other long term (current) drug therapy: Secondary | ICD-10-CM | POA: Diagnosis not present

## 2020-12-27 DIAGNOSIS — Z7984 Long term (current) use of oral hypoglycemic drugs: Secondary | ICD-10-CM | POA: Diagnosis not present

## 2020-12-27 DIAGNOSIS — I10 Essential (primary) hypertension: Secondary | ICD-10-CM | POA: Insufficient documentation

## 2020-12-27 DIAGNOSIS — R059 Cough, unspecified: Secondary | ICD-10-CM | POA: Diagnosis present

## 2020-12-27 DIAGNOSIS — E119 Type 2 diabetes mellitus without complications: Secondary | ICD-10-CM | POA: Insufficient documentation

## 2020-12-27 MED ORDER — METHYLPREDNISOLONE SODIUM SUCC 125 MG IJ SOLR: 125.0000 mg | Freq: Once | INTRAMUSCULAR | Status: DC | PRN

## 2020-12-27 MED ORDER — FAMOTIDINE IN NACL 20-0.9 MG/50ML-% IV SOLN: 20.0000 mg | Freq: Once | INTRAVENOUS | Status: DC | PRN

## 2020-12-27 MED ORDER — DIPHENHYDRAMINE HCL 50 MG/ML IJ SOLN: 50.0000 mg | Freq: Once | INTRAMUSCULAR | Status: DC | PRN

## 2020-12-27 MED ORDER — BEBTELOVIMAB 175 MG/2 ML IV (EUA)
175.0000 mg | Freq: Once | INTRAMUSCULAR | Status: AC
  Administered 2020-12-27: 175 mg via INTRAVENOUS
  Filled 2020-12-27: qty 2

## 2020-12-27 MED ORDER — SODIUM CHLORIDE 0.9 % IV SOLN: INTRAVENOUS | Status: DC | PRN

## 2020-12-27 MED ORDER — EPINEPHRINE 0.3 MG/0.3ML IJ SOAJ: 0.3000 mg | Freq: Once | INTRAMUSCULAR | Status: DC | PRN

## 2020-12-27 MED ORDER — ALBUTEROL SULFATE HFA 108 (90 BASE) MCG/ACT IN AERS: 2.0000 | INHALATION_SPRAY | Freq: Once | RESPIRATORY_TRACT | Status: DC | PRN

## 2020-12-27 NOTE — ED Provider Notes (Signed)
[facility] DEPT Provider Note   CSN: [id number] Arrival date & time: 12/27/20  1741     History Chief Complaint  Patient presents with   Covid Positive    Monique Wiggins is a 55 y.o. female.  HPI  Patient presents to the ED for evaluation of cough, fever, body aches, runny nose and sore throat.  Patient did a home COVID test and it was positive.  She had a video visit with her doctor today.  Patient had a home oxygen saturation monitor and it was decreasing down into the 80s to he suggested she come to the ED for evaluation.  Right now the patient is not feeling short of breath.  She has not had any vomiting or diarrhea.  Patient has been fully vaccinated for COVID  Past Medical History:  Diagnosis Date   Allergy    Arthritis    Back pain    Carpal tunnel syndrome    Diabetes mellitus without complication ([place])    DVT (deep venous thrombosis) ([place])    after long car trip   Dyspnea    GERD (gastroesophageal reflux disease)    H. pylori infection    1997   Heart murmur    Hypertension    Hypertriglyceridemia 02/22/2017   Hypertriglyceridemia 02/22/2017   Migraines    Sickle cell trait ([place])    Sleep apnea    states she no longer has OSA lost weight    Patient Active Problem List   Diagnosis Date Noted   RUQ pain 12/21/2020   Abdominal pain, epigastric 12/21/2020   Diarrhea 11/14/2020   Nausea without vomiting 11/14/2020   Abdominal pain 11/14/2020   Capsulitis of right shoulder 03/25/2020   Back pain 01/08/2019   Thumb pain, right 01/08/2019   Obesity 10/12/2018   Perimenopause 06/30/2018   History of DVT (deep vein thrombosis) 06/30/2018   Preventative health care 03/05/2018   Hypokalemia 03/05/2018   Acid reflux 03/05/2018   Diabetes mellitus without complication ([place])    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   Hyperlipidemia 02/22/2017    Past Surgical History:  Procedure Laterality  Date   ABDOMINAL HYSTERECTOMY     ovaries left in place   CARPAL TUNNEL RELEASE Right 01/19/2019   Procedure: ENDOSCOPIC CARPAL TUNNEL RELEASE;  Surgeon: Milly Jakob, MD;  Location: [facility];  Service: Orthopedics;  Laterality: Right;   CARPOMETACARPEL SUSPENSION PLASTY Right 01/19/2019   Procedure: RIGHT THUMB SUSPENSION ARTHROPLASTY;  Surgeon: Milly Jakob, MD;  Location: [facility];  Service: Orthopedics;  Laterality: Right;  PREOP BLOCK   TOE SURGERY     left great toe, repair of severed tendon   TONSILLECTOMY     tummy tuck       OB History     Gravida  5   Para  5   Term      Preterm      AB      Living         SAB      IAB      Ectopic      Multiple      Live Births              Family History  Problem Relation Age of Onset   Diabetes type II Mother    Hypertension Mother    Diabetes Mother    Diabetes Mellitus II Father    Pulmonary fibrosis Father  Diabetes Father    Hypertension Father    Pulmonary disease Father    High Cholesterol Father    Hypertension Sister    Diabetes Sister    Cancer Maternal Grandmother        colon   Allergic Disorder Son    Cancer Maternal Grandfather        lung, smoker   Allergic Disorder Son    Asthma Son    Colon cancer Neg Hx    Esophageal cancer Neg Hx    Pancreatic cancer Neg Hx    Stomach cancer Neg Hx    Rectal cancer Neg Hx     Social History   Tobacco Use   Smoking status: Never   Smokeless tobacco: Never  Vaping Use   Vaping Use: Never used  Substance Use Topics   Alcohol use: No    Comment: never   Drug use: No    Home Medications Prior to Admission medications   Medication Sig Start Date End Date Taking? Authorizing Provider  acetaminophen (TYLENOL) 325 MG tablet Take 2 tablets (650 mg total) by mouth every 6 (six) hours. 01/19/19   Milly Jakob, MD  albuterol (VENTOLIN HFA) 108 (90 Base) MCG/ACT inhaler Inhale 2 puffs into the lungs every 6  (six) hours as needed for wheezing or shortness of breath. Patient not taking: Reported on 12/27/2020 10/09/18   Mosie Lukes, MD  aluminum-magnesium hydroxide-simethicone (MAALOX) 200-200-20 MG/5ML SUSP Take 30 mLs by mouth 3 (three) times daily with meals as needed. 11/08/20   Varney Biles, MD  amLODipine (NORVASC) 5 MG tablet TAKE 1 TABLET BY MOUTH DAILY 11/24/19 02/20/21  Mosie Lukes, MD  atorvastatin (LIPITOR) 10 MG tablet TAKE 1 TABLET BY MOUTH DAILY 12/31/19 12/30/20  Mosie Lukes, MD  carvedilol (COREG) 25 MG tablet TAKE 1 TABLET BY MOUTH 2 TIMES DAILY WITH A MEAL. 07/26/20 07/26/21  Mosie Lukes, MD  cetirizine (ZYRTEC) 10 MG tablet Take 1 tablet (10 mg total) by mouth daily. 10/30/16   Debbrah Alar, NP  cyclobenzaprine (FLEXERIL) 10 MG tablet Take 0.5-1 tablets (5-10 mg total) by mouth 3 (three) times daily as needed for muscle spasms. Patient not taking: Reported on 12/27/2020 03/21/20   Shelda Pal, DO  dapagliflozin propanediol (FARXIGA) 10 MG TABS tablet TAKE 1 TABLET BY MOUTH ONCE A DAY BEFORE BREAKFAST 11/22/20 11/22/21  Mosie Lukes, MD  dicyclomine (BENTYL) 10 MG capsule Take 1 capsule (10 mg total) by mouth every 8 (eight) hours as needed for spasms. Patient not taking: Reported on 12/27/2020 07/12/20   Yetta Flock, MD  fluticasone [facility]) 50 MCG/ACT nasal spray Place 2 sprays into both nostrils daily. Patient not taking: Reported on 12/27/2020 08/22/18   Saguier, Percell Miller, PA-C  gabapentin (NEURONTIN) 300 MG capsule Take 1 capsule (300 mg total) by mouth 3 (three) times daily. 09/05/20   Shelda Pal, DO  linaclotide [facility]) 145 MCG CAPS capsule Take 1 capsule (145 mcg total) by mouth daily before breakfast. Patient not taking: Reported on 12/27/2020 08/01/20   Armbruster, Carlota Raspberry, MD  losartan-hydrochlorothiazide (HYZAAR) 100-25 MG tablet TAKE 1 TABLET BY MOUTH ONCE A DAY 02/26/20   Mosie Lukes, MD  meloxicam (MOBIC) 15 MG tablet TAKE 1  TABLET(15 MG) BY MOUTH DAILY Patient not taking: Reported on 12/27/2020 04/22/20   Shelda Pal, DO  metFORMIN (GLUCOPHAGE) 500 MG tablet Take 1 tablet (500 mg total) by mouth 2 (two) times daily with a meal. 11/24/20  Mosie Lukes, MD  metoCLOPramide (REGLAN) 10 MG tablet Take 1 tablet (10 mg total) by mouth every 6 (six) hours. 11/08/20   Varney Biles, MD  mometasone (ELOCON) 0.1 % cream Apply 1 application topically daily. Patient not taking: Reported on 12/27/2020 07/23/17   Debbrah Alar, NP  phentermine 37.5 MG capsule Take 37.5 mg by mouth as directed.    [provider]  potassium chloride SA (KLOR-CON) 20 MEQ tablet TAKE 1 TABLET BY MOUTH ONCE DAILY 04/12/20 04/12/21  Shelda Pal, DO  topiramate (TOPAMAX) 50 MG tablet Take 1 tablet (50 mg total) by mouth daily. 10/10/20 10/10/21  Mosie Lukes, MD  tretinoin (RETIN-A) 0.025 % cream APPLY TO THE AFFECTED AREA(S) AT BEDTIME Patient not taking: Reported on 12/27/2020 06/17/18   Debbrah Alar, NP    Allergies    Pollen extract and Shellfish allergy  Review of Systems   Review of Systems  All other systems reviewed and are negative.  Physical Exam Updated Vital Signs BP (!) 143/88 (BP Location: Left Arm)   Pulse 66   Temp 98 F (36.7 C) (Oral)   Resp 16   Ht 1.568 m (5' 1.75")   Wt 79.4 kg   SpO2 95%   BMI 32.27 kg/m   Physical Exam Vitals and nursing note reviewed.  Constitutional:      General: She is not in acute distress.    Appearance: She is well-developed.  HENT:     Head: Normocephalic and atraumatic.     Right Ear: External ear normal.     Left Ear: External ear normal.  Eyes:     General: No scleral icterus.       Right eye: No discharge.        Left eye: No discharge.     Conjunctiva/sclera: Conjunctivae normal.  Neck:     Trachea: No tracheal deviation.  Cardiovascular:     Rate and Rhythm: Normal rate and regular rhythm.  Pulmonary:     Effort: Pulmonary  effort is normal. No respiratory distress.     Breath sounds: Normal breath sounds. No stridor. No wheezing or rales.  Abdominal:     General: Bowel sounds are normal. There is no distension.     Palpations: Abdomen is soft.     Tenderness: There is no abdominal tenderness. There is no guarding or rebound.  Musculoskeletal:        General: No tenderness or deformity.     Cervical back: Neck supple.  Skin:    General: Skin is warm and dry.     Findings: No rash.  Neurological:     General: No focal deficit present.     Mental Status: She is alert.     Cranial Nerves: No cranial nerve deficit (no facial droop, extraocular movements intact, no slurred speech).     Sensory: No sensory deficit.     Motor: No abnormal muscle tone or seizure activity.     Coordination: Coordination normal.  Psychiatric:        Mood and Affect: Mood normal.    ED Results / Procedures / Treatments   Labs (all labs ordered are listed, but only abnormal results are displayed) Labs Reviewed - No data to display  EKG None  Radiology No results found.  Procedures Procedures   Medications Ordered in ED Medications  0.9 %  sodium chloride infusion (has no administration in time range)  diphenhydrAMINE (BENADRYL) injection 50 mg (has no administration in time range)  famotidine (PEPCID) IVPB 20 mg premix (has no administration in time range)  methylPREDNISolone sodium succinate (SOLU-MEDROL) 125 mg/2 mL injection 125 mg (has no administration in time range)  albuterol (VENTOLIN HFA) 108 (90 Base) MCG/ACT inhaler 2 puff (has no administration in time range)  EPINEPHrine (EPI-PEN) injection 0.3 mg (has no administration in time range)  bebtelovimab EUA injection SOLN 175 mg (175 mg Intravenous Given 12/27/20 2256)    ED Course  I have reviewed the triage vital signs and the nursing notes.  Pertinent labs & imaging results that were available during my care of the patient were reviewed by me and  considered in my medical decision making (see chart for details).  Clinical Course as of 12/28/20 0000  Tue Dec 27, 2020  2352 Patient tolerated the monoclonal antibody infusion.  No adverse effects noted [JK]    Clinical Course User Index [JK] Dorie Rank, MD   MDM Rules/Calculators/A&P                          Patient had a positive home COVID test.  In the ED her oxygen saturation is stable in the mid 90s.  Patient did walk around in the emergency room while on the pulse oximeter and her saturation never dropped below 91%.  Her lungs are clear.  She does not appear to require admission to the hospital for treatment of her COVID infection.  Patient does have risk factors.  We discussed options of paxlovid versus monoclonal antibody.  Patient would prefer the latter.  We will proceed to administer that here in the ED Final Clinical Impression(s) / ED Diagnoses Final diagnoses:  COVID-19 virus infection    Rx / DC Orders ED Discharge Orders     None        Dorie Rank, MD 12/28/20 0000

## 2020-12-27 NOTE — ED Triage Notes (Signed)
Patient states she had 2 home Covid tests that were positive last night. Patient states her O2 sats have been in the 80's. O2 Sats in triage-94%on room air.  Patient states she had a fever of 102.4 tympanically. Patient states she took  4 Ibuprofen at 1700 today. Patient also c/o body aches, nausea,headache, runny nose, and sore throat.

## 2020-12-27 NOTE — ED Notes (Signed)
Per PCP-states covid symptoms that started last night-states he does not feel comfortable treating patient "without listening to her lungs" sending to ED for eval

## 2020-12-27 NOTE — Discharge Instructions (Addendum)
You were given a monoclonal antibody treatment for your COVID infection here in the emergency room today.  Continue over-the-counter medications at home.  Return to the ED for shortness of breath.

## 2020-12-27 NOTE — Progress Notes (Signed)
Subjective:    Patient ID: Monique Wiggins, female    DOB: Oct 17, 1965, 55 y.o.   MRN: [id number]  DOS:  12/27/2020 Type of visit - description: Virtual Visit via Video Note  I connected with the above patient  by a video enabled telemedicine application and verified that I am speaking with the correct person using two identifiers.   THIS ENCOUNTER IS A VIRTUAL VISIT DUE TO COVID-19 - PATIENT WAS NOT SEEN IN THE OFFICE. PATIENT HAS CONSENTED TO VIRTUAL VISIT / TELEMEDICINE VISIT   Location of patient: home  Location of provider: office  Persons participating in the virtual visit: patient, provider   I discussed the limitations of evaluation and management by telemedicine and the availability of in person appointments. The patient expressed understanding and agreed to proceed.  Acute Symptoms started last night at 7 PM when she has some sore throat. She woke up today at 1 AM with aches, headache, sore throat, runny nose. She got tested for COVID at home and it came back positive.  Repeated test also came back positive. Her oxygen saturation has been low.   Review of Systems Denies chest pain or difficulty breathing, some DOE with normal activities. Some cough but no wheezing Had nausea earlier today but no vomiting.   Past Medical History:  Diagnosis Date   Allergy    Arthritis    Back pain    Carpal tunnel syndrome    Diabetes mellitus without complication ([place])    DVT (deep venous thrombosis) (HCC)    after long car trip   Dyspnea    GERD (gastroesophageal reflux disease)    H. pylori infection    1997   Heart murmur    Hypertension    Hypertriglyceridemia 02/22/2017   Hypertriglyceridemia 02/22/2017   Migraines    Sickle cell trait ([place])    Sleep apnea    states she no longer has OSA lost weight    Past Surgical History:  Procedure Laterality Date   ABDOMINAL HYSTERECTOMY     ovaries left in place   CARPAL TUNNEL RELEASE Right 01/19/2019   Procedure:  ENDOSCOPIC CARPAL TUNNEL RELEASE;  Surgeon: Milly Jakob, MD;  Location: [facility];  Service: Orthopedics;  Laterality: Right;   CARPOMETACARPEL SUSPENSION PLASTY Right 01/19/2019   Procedure: RIGHT THUMB SUSPENSION ARTHROPLASTY;  Surgeon: Milly Jakob, MD;  Location: [facility];  Service: Orthopedics;  Laterality: Right;  PREOP BLOCK   TOE SURGERY     left great toe, repair of severed tendon   TONSILLECTOMY     tummy tuck      Allergies as of 12/27/2020       Reactions   Pollen Extract Swelling   Shellfish Allergy Swelling        Medication List        Accurate as of December 27, 2020  4:52 PM. If you have any questions, ask your nurse or doctor.          acetaminophen 325 MG tablet Commonly known as: Tylenol Take 2 tablets (650 mg total) by mouth every 6 (six) hours.   albuterol 108 (90 Base) MCG/ACT inhaler Commonly known as: VENTOLIN HFA Inhale 2 puffs into the lungs every 6 (six) hours as needed for wheezing or shortness of breath.   aluminum-magnesium hydroxide-simethicone 431-540-08 MG/5ML Susp Commonly known as: MAALOX Take 30 mLs by mouth 3 (three) times daily with meals as needed.   amLODipine 5 MG tablet Commonly known as: NORVASC TAKE  1 TABLET BY MOUTH DAILY   atorvastatin 10 MG tablet Commonly known as: LIPITOR TAKE 1 TABLET BY MOUTH DAILY   carvedilol 25 MG tablet Commonly known as: COREG TAKE 1 TABLET BY MOUTH 2 TIMES DAILY WITH A MEAL.   cetirizine 10 MG tablet Commonly known as: ZYRTEC Take 1 tablet (10 mg total) by mouth daily.   cyclobenzaprine 10 MG tablet Commonly known as: FLEXERIL Take 0.5-1 tablets (5-10 mg total) by mouth 3 (three) times daily as needed for muscle spasms.   dicyclomine 10 MG capsule Commonly known as: BENTYL Take 1 capsule (10 mg total) by mouth every 8 (eight) hours as needed for spasms.   Farxiga 10 MG Tabs tablet Generic drug: dapagliflozin propanediol TAKE 1 TABLET BY MOUTH  ONCE A DAY BEFORE BREAKFAST   fluticasone 50 MCG/ACT nasal spray Commonly known as: FLONASE Place 2 sprays into both nostrils daily.   gabapentin 300 MG capsule Commonly known as: NEURONTIN Take 1 capsule (300 mg total) by mouth 3 (three) times daily.   linaclotide 145 MCG Caps capsule Commonly known as: LINZESS Take 1 capsule (145 mcg total) by mouth daily before breakfast.   losartan-hydrochlorothiazide 100-25 MG tablet Commonly known as: HYZAAR TAKE 1 TABLET BY MOUTH ONCE A DAY   meloxicam 15 MG tablet Commonly known as: MOBIC TAKE 1 TABLET(15 MG) BY MOUTH DAILY   metFORMIN 500 MG tablet Commonly known as: GLUCOPHAGE Take 1 tablet (500 mg total) by mouth 2 (two) times daily with a meal.   metoCLOPramide 10 MG tablet Commonly known as: REGLAN Take 1 tablet (10 mg total) by mouth every 6 (six) hours.   mometasone 0.1 % cream Commonly known as: ELOCON Apply 1 application topically daily.   phentermine 37.5 MG capsule Take 37.5 mg by mouth as directed.   potassium chloride SA 20 MEQ tablet Commonly known as: KLOR-CON TAKE 1 TABLET BY MOUTH ONCE DAILY   topiramate 50 MG tablet Commonly known as: TOPAMAX Take 1 tablet (50 mg total) by mouth daily.   tretinoin 0.025 % cream Commonly known as: RETIN-A APPLY TO THE AFFECTED AREA(S) AT BEDTIME           Objective:   Physical Exam BP 119/81   Pulse 72   Temp (!) 101.2 F (38.4 C) (Tympanic)   Ht 5' 1.75" (1.568 m)   Wt 178 lb (80.7 kg)   SpO2 (!) 88%   BMI 32.82 kg/m  This is a virtual video visit, she is alert oriented x3, sitting at home without apparent distress, speaking in complete sentences.    Assessment    55 year old female, PMH includes DM, HTN, normal kidney function, status post 3 COVID vaccines, presents with: COVID infection: The patient was exposed to somebody with COVID 3 days ago. Started with symptoms few hours ago, tested positive twice. O2 sat at the time of this conversation was  91% however she has been checking several times throughout the day and has been as low as 88%.  No chest pain but some DOE with normal activities. Given risk factors low oxygen I recommend to be evaluated at the ER to see if she is appropriate for outpatient treatment. She states she will go to [facility], ER, I called the charge nurse at the ER and let her know.   I discussed the assessment and treatment plan with the patient. The patient was provided an opportunity to ask questions and all were answered. The patient agreed with the plan and demonstrated an understanding  of the instructions.   The patient was advised to call back or seek an in-person evaluation if the symptoms worsen or if the condition fails to improve as anticipated.

## 2020-12-28 ENCOUNTER — Ambulatory Visit: Payer: 59 | Admitting: Gastroenterology

## 2020-12-28 ENCOUNTER — Telehealth: Payer: Self-pay | Admitting: Family Medicine

## 2020-12-28 NOTE — Telephone Encounter (Signed)
Forms faxed into front office   Placed into bin up front for blyth to fill out

## 2020-12-29 ENCOUNTER — Telehealth: Payer: Self-pay | Admitting: Gastroenterology

## 2020-12-29 NOTE — Telephone Encounter (Signed)
The pt was referred during an office visit please send accordingly.

## 2020-12-29 NOTE — Telephone Encounter (Signed)
Pt calling to inform she is experiencing abd pain/some diarrhea//. Pt states she hasn't heard from surgical center.. Plz advise  thank you

## 2021-01-02 ENCOUNTER — Encounter: Payer: Self-pay | Admitting: Family Medicine

## 2021-01-02 ENCOUNTER — Telehealth: Payer: Self-pay | Admitting: Family Medicine

## 2021-01-02 NOTE — Telephone Encounter (Signed)
Paperwork was given to [employer]

## 2021-01-02 NOTE — Telephone Encounter (Signed)
Form place in yellow bin for review and completion

## 2021-01-02 NOTE — Telephone Encounter (Signed)
FMLA forms droppeed off by Mr Dieckman Placed in tray

## 2021-01-03 ENCOUNTER — Telehealth: Payer: Self-pay | Admitting: Gastroenterology

## 2021-01-03 NOTE — Telephone Encounter (Signed)
Patient notified that paper work is completed and faxed.  Original mailed to patient.  Copy sent to scan.

## 2021-01-03 NOTE — Telephone Encounter (Signed)
Inbound call from patient. Patient gave a HIDA scan at [facility]. States it says she have to lay in scan for 2 hours and is claustrophobic. Asking if there is something that can be given so she will not freak out during the scan. Best contact number [phone]

## 2021-01-03 NOTE — Telephone Encounter (Signed)
Spoke with radiology and they state the Hida scan does not go above the shoulders. I informed patient of this and she states she should be okay.

## 2021-01-03 NOTE — Telephone Encounter (Signed)
Please advise 

## 2021-01-04 ENCOUNTER — Other Ambulatory Visit: Payer: Self-pay

## 2021-01-04 ENCOUNTER — Encounter: Payer: Self-pay | Admitting: Family Medicine

## 2021-01-04 ENCOUNTER — Ambulatory Visit (HOSPITAL_COMMUNITY)
Admission: RE | Admit: 2021-01-04 | Discharge: 2021-01-04 | Disposition: A | Discharge status: Home / Self Care | Payer: 59 | Source: Ambulatory Visit | Attending: Gastroenterology | Admitting: Gastroenterology

## 2021-01-04 DIAGNOSIS — R1011 Right upper quadrant pain: Secondary | ICD-10-CM | POA: Insufficient documentation

## 2021-01-04 DIAGNOSIS — R1013 Epigastric pain: Secondary | ICD-10-CM | POA: Diagnosis not present

## 2021-01-04 MED ORDER — TECHNETIUM TC 99M MEBROFENIN IV KIT
5.5000 | PACK | Freq: Once | INTRAVENOUS | Status: AC
  Administered 2021-01-04: 5.5 via INTRAVENOUS

## 2021-01-05 NOTE — Telephone Encounter (Signed)
I didn't speak to patient yesterday , please advise

## 2021-01-06 ENCOUNTER — Telehealth: Payer: Self-pay | Admitting: Gastroenterology

## 2021-01-06 ENCOUNTER — Telehealth: Payer: Self-pay | Admitting: *Deleted

## 2021-01-06 NOTE — Telephone Encounter (Signed)
I left a message with the referral coordinator at [facility], Lilia Pro, to call back with the status of referral and if we can expedite her appointment. Patient notified I will call her once I hear back.

## 2021-01-06 NOTE — Telephone Encounter (Signed)
Received a fax for a part that needed to be completed for form. The section was filled out and need to clarify what [employer] wants.  Left message on machine for Amanda from [employer] to call back.

## 2021-01-06 NOTE — Telephone Encounter (Signed)
FYI..Patient calling to inform she didn't receive phone call from surgeon. Plz advise   thanks

## 2021-01-06 NOTE — Telephone Encounter (Signed)
Paperwork just faxed back anyway.  Wrote message to call back with questions.

## 2021-01-09 NOTE — Telephone Encounter (Signed)
Left message on machine to call back  

## 2021-01-09 NOTE — Telephone Encounter (Signed)
I spoke with the pt and advised her that she will be contacted by [facility] and if she has not heard from them in 1 week I gave her the number to call.

## 2021-01-10 ENCOUNTER — Other Ambulatory Visit (HOSPITAL_COMMUNITY): Payer: Self-pay

## 2021-01-10 ENCOUNTER — Other Ambulatory Visit: Payer: Self-pay | Admitting: Family Medicine

## 2021-01-10 MED ORDER — ATORVASTATIN CALCIUM 10 MG PO TABS
ORAL_TABLET | Freq: Every day | ORAL | 4 refills | Status: DC
  Filled 2021-01-10: qty 90, 90d supply, fill #0
  Filled 2021-05-04: qty 90, 90d supply, fill #1

## 2021-01-10 MED ORDER — GABAPENTIN 300 MG PO CAPS
ORAL_CAPSULE | ORAL | 2 refills | Status: DC
  Filled 2021-01-10: qty 30, 30d supply, fill #0
  Filled 2021-02-24: qty 30, 30d supply, fill #1
  Filled 2021-04-04: qty 30, 30d supply, fill #2

## 2021-01-11 ENCOUNTER — Other Ambulatory Visit: Payer: Self-pay | Admitting: Family Medicine

## 2021-01-11 ENCOUNTER — Telehealth: Payer: Self-pay

## 2021-01-11 DIAGNOSIS — K828 Other specified diseases of gallbladder: Secondary | ICD-10-CM | POA: Diagnosis not present

## 2021-01-11 DIAGNOSIS — E119 Type 2 diabetes mellitus without complications: Secondary | ICD-10-CM

## 2021-01-11 NOTE — Telephone Encounter (Signed)
Patient will like to be referred to Dr. Clydie Braun. Please initiate referral.

## 2021-01-17 ENCOUNTER — Telehealth: Payer: Self-pay

## 2021-01-17 NOTE — Telephone Encounter (Signed)
-----   Message from Yevette Edwards, RN sent at 07/19/2020 12:02 PM EST ----- Regarding: Labs Repeat hepatic function panel. Order in epic.

## 2021-01-17 NOTE — Telephone Encounter (Signed)
MyChart message sent to patient with lab reminder.  

## 2021-01-18 ENCOUNTER — Other Ambulatory Visit: Payer: 59

## 2021-01-18 DIAGNOSIS — K760 Fatty (change of) liver, not elsewhere classified: Secondary | ICD-10-CM

## 2021-01-18 LAB — HEPATIC FUNCTION PANEL
ALT: 36 U/L — ABNORMAL HIGH (ref 0–35)
AST: 23 U/L (ref 0–37)
Albumin: 4.5 g/dL (ref 3.5–5.2)
Alkaline Phosphatase: 96 U/L (ref 39–117)
Bilirubin, Direct: 0.1 mg/dL (ref 0.0–0.3)
Total Bilirubin: 0.5 mg/dL (ref 0.2–1.2)
Total Protein: 7.7 g/dL (ref 6.0–8.3)

## 2021-01-19 NOTE — Progress Notes (Addendum)
PCP -  Cardiologist -   PPM/ICD -  Device Orders -  Rep Notified -   Chest x-ray -  EKG - 11-08-20 Stress Test -  ECHO -  Cardiac Cath -  Device orders  Sleep Study -  CPAP -   Fasting Blood Sugar -  Checks Blood Sugar _____ times a day  Blood Thinner Instructions: Aspirin Instructions:  ERAS Protcol - PRE-SURGERY Ensure or G2-   COVID TEST- Amb  covid + 12-27-20  Activity-- Anesthesia review: HTN,DM, OSA  Patient denies shortness of breath, fever, cough and chest pain at PAT appointment   All instructions explained to the patient, with a verbal understanding of the material. Patient agrees to go over the instructions while at home for a better understanding. Patient also instructed to self quarantine after being tested for COVID-19. The opportunity to ask questions was provided.

## 2021-01-19 NOTE — Progress Notes (Signed)
Please enter orders for surgery 01-24-21.

## 2021-01-19 NOTE — Patient Instructions (Addendum)
DUE TO COVID-19 ONLY ONE VISITOR IS ALLOWED TO COME WITH YOU AND STAY IN THE WAITING ROOM ONLY DURING PRE OP AND PROCEDURE.   **NO VISITORS ARE ALLOWED IN THE SHORT STAY AREA OR RECOVERY ROOM!!**   Your procedure is scheduled on: 01/24/21   Report to [facility] Main  Entrance    Report to admitting at 9:00 AM   Call this number if you have problems the morning of surgery [phone]   Do not eat food :After Midnight.   May have liquids until   8:00 AM day of surgery  CLEAR LIQUID DIET  Foods Allowed                                                                     Foods Excluded  Water, Black Coffee and tea, regular and decaf               liquids that you cannot  Plain Jell-O in any flavor  (No red)                                    see through such as: Fruit ices (not with fruit pulp)                                            milk, soups, orange juice              Iced Popsicles (No red)                                               All solid food                                   Apple juices Sports drinks like Gatorade (No red) Lightly seasoned clear broth or consume(fat free) Sugar, honey syrup     Oral Hygiene is also important to reduce your risk of infection.                                    Remember - BRUSH YOUR TEETH THE MORNING OF SURGERY WITH YOUR REGULAR TOOTHPASTE   Take these medicines the morning of surgery with A SIP OF WATER: Carvedilol, Atorvastatin, Amlodipine, Inhalers.  DO NOT TAKE ANY ORAL DIABETIC MEDICATIONS DAY OF YOUR SURGERY  How to Manage Your Diabetes Before and After Surgery  Why is it important to control my blood sugar before and after surgery? Improving blood sugar levels before and after surgery helps healing and can limit problems. A way of improving blood sugar control is eating a healthy diet by:  Eating less sugar and carbohydrates  Increasing activity/exercise  Talking with your doctor about reaching your blood sugar  goals High blood sugars (greater than 180 mg/dL) can raise your risk of  infections and slow your recovery, so you will need to focus on controlling your diabetes during the weeks before surgery. Make sure that the doctor who takes care of your diabetes knows about your planned surgery including the date and location.  How do I manage my blood sugar before surgery? Check your blood sugar at least 4 times a day, starting 2 days before surgery, to make sure that the level is not too high or low. Check your blood sugar the morning of your surgery when you wake up and every 2 hours until you get to the Short Stay unit. If your blood sugar is less than 70 mg/dL, you will need to treat for low blood sugar: Do not take insulin. Treat a low blood sugar (less than 70 mg/dL) with  cup of clear juice (cranberry or apple), 4 glucose tablets, OR glucose gel. Recheck blood sugar in 15 minutes after treatment (to make sure it is greater than 70 mg/dL). If your blood sugar is not greater than 70 mg/dL on recheck, call [phone] for further instructions. Report your blood sugar to the short stay nurse when you get to Short Stay.  If you are admitted to the hospital after surgery: Your blood sugar will be checked by the staff and you will probably be given insulin after surgery (instead of oral diabetes medicines) to make sure you have good blood sugar levels. The goal for blood sugar control after surgery is 80-180 mg/dL.   WHAT DO I DO ABOUT MY DIABETES MEDICATION?  Do not take oral diabetes medicines (pills) the morning of surgery.  THE DAY BEFORE SURGERY, take Metformin as prescribed. Do not take dapagliflozin.     THE MORNING OF SURGERY, do not take Metformin or Dapagliflozin.     Reviewed and Endorsed by [facility] Patient Education Committee, August 2015                               You may not have any metal on your body including hair pins, jewelry, and body piercing             Do not  wear make-up, lotions, powders, perfumes, or deodorant  Do not wear nail polish including gel and S&S, artificial/acrylic nails, or any other type of covering on natural nails including finger and toenails. If you have artificial nails, gel coating, etc. that needs to be removed by a nail salon please have this removed prior to surgery or surgery may need to be canceled/ delayed if the surgeon/ anesthesia feels like they are unable to be safely monitored.   Do not shave  48 hours prior to surgery.    Do not bring valuables to the hospital. [place].   Patients discharged the day of surgery will not be allowed to drive home.  Special Instructions: Bring a copy of your healthcare power of attorney and living will documents         the day of surgery if you haven't scanned them in before.   Please read over the following fact sheets you were given: IF YOU HAVE QUESTIONS ABOUT YOUR PRE OP INSTRUCTIONS PLEASE CALL [phone]- [facility] - Preparing for Surgery Before surgery, you can play an important role.  Because skin is not sterile, your skin needs to be as free of  germs as possible.  You can reduce the number of germs on your skin by washing with CHG (chlorahexidine gluconate) soap before surgery.  CHG is an antiseptic cleaner which kills germs and bonds with the skin to continue killing germs even after washing. Please DO NOT use if you have an allergy to CHG or antibacterial soaps.  If your skin becomes reddened/irritated stop using the CHG and inform your nurse when you arrive at Short Stay. Do not shave (including legs and underarms) for at least 48 hours prior to the first CHG shower.  You may shave your face/neck.  Please follow these instructions carefully:  1.  Shower with CHG Soap the night before surgery and the  morning of surgery.  2.  If you choose to wash your hair, wash your hair first as usual with your normal   shampoo.  3.  After you shampoo, rinse your hair and body thoroughly to remove the shampoo.                             4.  Use CHG as you would any other liquid soap.  You can apply chg directly to the skin and wash.  Gently with a scrungie or clean washcloth.  5.  Apply the CHG Soap to your body ONLY FROM THE NECK DOWN.   Do   not use on face/ open                           Wound or open sores. Avoid contact with eyes, ears mouth and   genitals (private parts).                       Wash face,  Genitals (private parts) with your normal soap.             6.  Wash thoroughly, paying special attention to the area where your    surgery  will be performed.  7.  Thoroughly rinse your body with warm water from the neck down.  8.  DO NOT shower/wash with your normal soap after using and rinsing off the CHG Soap.                9.  Pat yourself dry with a clean towel.            10.  Wear clean pajamas.            11.  Place clean sheets on your bed the night of your first shower and do not  sleep with pets. Day of Surgery : Do not apply any lotions/deodorants the morning of surgery.  Please wear clean clothes to the hospital/surgery center.  FAILURE TO FOLLOW THESE INSTRUCTIONS MAY RESULT IN THE CANCELLATION OF YOUR SURGERY  PATIENT SIGNATURE_________________________________  NURSE SIGNATURE__________________________________  ________________________________________________________________________

## 2021-01-20 NOTE — Progress Notes (Addendum)
COVID Vaccine Completed: Yes x4 Date COVID Vaccine completed: 06/17/19, 07/07/19 Has received booster:04/09/20, 01/11/21 COVID vaccine manufacturer: [place]      Date of COVID positive in last 90 days: yes positive 7/12  PCP - Penni Homans, MD Cardiologist - N/a  Chest x-ray - N/a EKG - 11/08/20 Epic Stress Test - yes long time ago ECHO - N/a Cardiac Cath - N/a Pacemaker/ICD device last checked: N/a Spinal Cord Stimulator: N/a  Sleep Study - yes positive, now clear since losing weight. No longer uses CPAP  Fasting Blood Sugar - 120-140 Checks Blood Sugar __every few days___ times a day  Blood Thinner Instructions: N/a Aspirin Instructions: Last Dose:  Activity level: Can go up a flight of stairs and perform activities of daily living without stopping and without symptoms of chest pain or shortness of breath. Patient has asthma and gets SOB sometimes, uses inhaler.     Anesthesia review:   Patient denies shortness of breath, fever, cough and chest pain at PAT appointment   Patient verbalized understanding of instructions that were given to them at the PAT appointment. Patient was also instructed that they will need to review over the PAT instructions again at home before surgery.

## 2021-01-23 ENCOUNTER — Other Ambulatory Visit: Payer: Self-pay

## 2021-01-23 ENCOUNTER — Encounter (HOSPITAL_COMMUNITY)
Admission: RE | Admit: 2021-01-23 | Discharge: 2021-01-23 | Disposition: A | Discharge status: Home / Self Care | Payer: 59 | Source: Ambulatory Visit | Attending: Surgery | Admitting: Surgery

## 2021-01-23 ENCOUNTER — Encounter (HOSPITAL_COMMUNITY): Payer: Self-pay

## 2021-01-23 DIAGNOSIS — Z01812 Encounter for preprocedural laboratory examination: Secondary | ICD-10-CM | POA: Insufficient documentation

## 2021-01-23 HISTORY — DX: Gastroparesis: K31.84

## 2021-01-23 LAB — CBC
HCT: 47.4 % — ABNORMAL HIGH (ref 36.0–46.0)
Hemoglobin: 14.9 g/dL (ref 12.0–15.0)
MCH: 26.6 pg (ref 26.0–34.0)
MCHC: 31.4 g/dL (ref 30.0–36.0)
MCV: 84.5 fL (ref 80.0–100.0)
Platelets: 352 10*3/uL (ref 150–400)
RBC: 5.61 MIL/uL — ABNORMAL HIGH (ref 3.87–5.11)
RDW: 14.6 % (ref 11.5–15.5)
WBC: 7.3 10*3/uL (ref 4.0–10.5)
nRBC: 0 % (ref 0.0–0.2)

## 2021-01-23 LAB — BASIC METABOLIC PANEL
Anion gap: 9 (ref 5–15)
BUN: 20 mg/dL (ref 6–20)
CO2: 20 mmol/L — ABNORMAL LOW (ref 22–32)
Calcium: 9.6 mg/dL (ref 8.9–10.3)
Chloride: 110 mmol/L (ref 98–111)
Creatinine, Ser: 0.76 mg/dL (ref 0.44–1.00)
GFR, Estimated: >60 mL/min (ref >60)
Glucose, Bld: 125 mg/dL — ABNORMAL HIGH (ref 70–99)
Potassium: 3.7 mmol/L (ref 3.5–5.1)
Sodium: 139 mmol/L (ref 135–145)

## 2021-01-23 LAB — HEMOGLOBIN A1C
Hgb A1c MFr Bld: 7.1 % — ABNORMAL HIGH (ref 4.8–5.6)
Mean Plasma Glucose: 157.07 mg/dL

## 2021-01-23 LAB — GLUCOSE, CAPILLARY: Glucose-Capillary: 143 mg/dL — ABNORMAL HIGH (ref 70–99)

## 2021-01-23 NOTE — H&P (Signed)
CC right upper quadrant pain  History of Present Illness: JOYCELIN Wiggins is a 55 y.o. female who is seen today as an office consultation at the request of Dr. Myrtice Lauth and Dr Charlett Blake for evaluation of .right upper quadrant abdominal pain with associated HIDA ejection of 11%. She was symptomatic after drinking the liquid for the test. I reviewed her medical history including the numerous medications she is on including Topamax and metformin. She does have diabetes hypertension. She has had 5 vaginal deliveries. She is recently been evaluated for proptosis and exophthalmos and is awaiting an appointment with an endocrinologist. I did review her labs from outside records showing her TSH was actually low and her thyroid hormones were not that elevated. She could however still have Graves' disease because she does not sleep well and seems nervous and. I will defer to her primary care doctors about that. With her diabetes she is apparently developed some degree of gastroparesis or autonomic did problems in her GI tract. She had a gallbladder ultrasound which showed some hepatic steatosis. They did not necessarily see any stones but her HIDA scan showed an 11% ejection fraction and she had pain after the half-and-half that she drank. If her symptoms come on rather randomly and routinely throughout the day not dependent on what she is eating.  I discussed videoscopic cholecystectomy with either the laparoscope or the robot and she would like to go and move forward with getting this gallbladder out. Would set this up over it [facility]. She does work at the [employer].   I gave her a booklet on cholecystectomy explained the complications not limited to common duct injuries bleeding bile leak etc.  Review of Systems: See HPI as well for other ROS.  Review of Systems  Eyes: Positive for redness.  Gastrointestinal: Positive for abdominal pain, constipation and nausea.  All other systems reviewed and are  negative.   Medical History: Past Medical History:  Diagnosis Date   Diabetes mellitus without complication (CMS-HCC)   Hypertension   There is no problem list on file for this patient.  Past Surgical History:  Procedure Laterality Date   hand surgery   HYSTERECTOMY   toe surgery   TONSILLECTOMY    No Known Allergies  Current Outpatient Medications on File Prior to Visit  Medication Sig Dispense Refill   amLODIPine (NORVASC) 5 MG tablet Take 1 tablet by mouth once daily   atorvastatin (LIPITOR) 10 MG tablet Take 1 tablet by mouth once daily   carvediloL (COREG) 25 MG tablet Take by mouth   dapagliflozin (FARXIGA) 10 mg tablet TAKE 1 TABLET BY MOUTH ONCE A DAY BEFORE BREAKFAST   gabapentin (NEURONTIN) 300 MG capsule Take 1 capsule by mouth nightly   metFORMIN (GLUCOPHAGE) 500 MG tablet Take by mouth   potassium chloride (KLOR-CON) 20 MEQ ER tablet Take 1 tablet by mouth once daily   topiramate (TOPAMAX) 50 MG tablet Take 50 mg by mouth once daily   losartan-hydrochlorothiazide (HYZAAR) 100-25 mg tablet Take 1 tablet by mouth once daily   No current facility-administered medications on file prior to visit.   Family History  Problem Relation Age of Onset   High blood pressure (Hypertension) Mother   Diabetes Mother   High blood pressure (Hypertension) Father   Diabetes Father   Obesity Sister   High blood pressure (Hypertension) Sister   Diabetes Sister   Stroke Other   Coronary Artery Disease (Blocked arteries around heart) Other   Colon cancer  Other    Social History   Tobacco Use  Smoking Status Never Smoker  Smokeless Tobacco Never Used    Social History   Socioeconomic History   Marital status: Married  Tobacco Use   Smoking status: Never Smoker   Smokeless tobacco: Never Used  Scientific laboratory technician Use: Never used  Substance and Sexual Activity   Alcohol use: Never   Drug use: Never   Sexual activity: Defer   Objective:   Vitals:  01/11/21  1419  BP: 122/84  Pulse: 76  SpO2: 98%  Weight: 82.6 kg (182 lb 3.2 oz)  Height: 157.5 cm ('5\' 2"'$ )   Body mass index is 33.32 kg/m.  Physical Exam General: Well maintained African-American female HEENT: Proptosis noted Chest: Clear Heart: Sinus rhythm without murmurs Breast: Not examined Abdomen: Not tender at the present time GU not examined Rectal not performed Extremities full range of motion Neuro alert and oriented x3. Motor and sensory function grossly intact  Labs, Imaging and Diagnostic Testing: Reviewed labs from outside sources including [place] and Plan:  Diagnoses and all orders for this visit:  Biliary dyskinesia     Robotic cholecystectomy at [facility].   Blair Mesina Donia Pounds, MD

## 2021-01-24 ENCOUNTER — Ambulatory Visit (HOSPITAL_COMMUNITY): Payer: 59 | Admitting: Anesthesiology

## 2021-01-24 ENCOUNTER — Encounter (HOSPITAL_COMMUNITY): Payer: Self-pay | Admitting: Surgery

## 2021-01-24 ENCOUNTER — Encounter (HOSPITAL_COMMUNITY)
Admission: RE | Disposition: A | Discharge status: Home / Self Care | Payer: Self-pay | Source: Home / Self Care | Attending: Surgery

## 2021-01-24 ENCOUNTER — Ambulatory Visit (HOSPITAL_COMMUNITY)
Admission: RE | Admit: 2021-01-24 | Discharge: 2021-01-24 | Disposition: A | Discharge status: Home / Self Care | Payer: 59 | Attending: Surgery | Admitting: Surgery

## 2021-01-24 ENCOUNTER — Other Ambulatory Visit (HOSPITAL_COMMUNITY): Payer: Self-pay

## 2021-01-24 DIAGNOSIS — Q441 Other congenital malformations of gallbladder: Secondary | ICD-10-CM | POA: Diagnosis present

## 2021-01-24 DIAGNOSIS — K811 Chronic cholecystitis: Secondary | ICD-10-CM | POA: Insufficient documentation

## 2021-01-24 DIAGNOSIS — K828 Other specified diseases of gallbladder: Secondary | ICD-10-CM | POA: Diagnosis not present

## 2021-01-24 DIAGNOSIS — E876 Hypokalemia: Secondary | ICD-10-CM | POA: Diagnosis not present

## 2021-01-24 DIAGNOSIS — Z79899 Other long term (current) drug therapy: Secondary | ICD-10-CM | POA: Insufficient documentation

## 2021-01-24 DIAGNOSIS — Z7984 Long term (current) use of oral hypoglycemic drugs: Secondary | ICD-10-CM | POA: Insufficient documentation

## 2021-01-24 DIAGNOSIS — I10 Essential (primary) hypertension: Secondary | ICD-10-CM | POA: Insufficient documentation

## 2021-01-24 DIAGNOSIS — E119 Type 2 diabetes mellitus without complications: Secondary | ICD-10-CM | POA: Insufficient documentation

## 2021-01-24 HISTORY — PX: CHOLECYSTECTOMY: SHX55

## 2021-01-24 LAB — GLUCOSE, CAPILLARY
Glucose-Capillary: 154 mg/dL — ABNORMAL HIGH (ref 70–99)
Glucose-Capillary: 151 mg/dL — ABNORMAL HIGH (ref 70–99)

## 2021-01-24 SURGERY — CHOLECYSTECTOMY, ROBOT-ASSISTED, LAPAROSCOPIC
Anesthesia: General | Site: Abdomen

## 2021-01-24 MED ORDER — LIDOCAINE 2% (20 MG/ML) 5 ML SYRINGE
INTRAMUSCULAR | Status: DC | PRN
  Administered 2021-01-24: 1.5 mg/kg/h via INTRAVENOUS

## 2021-01-24 MED ORDER — ACETAMINOPHEN 10 MG/ML IV SOLN
INTRAVENOUS | Status: AC
  Filled 2021-01-24: qty 100

## 2021-01-24 MED ORDER — FENTANYL CITRATE (PF) 250 MCG/5ML IJ SOLN
INTRAMUSCULAR | Status: DC | PRN
  Administered 2021-01-24: 50 ug via INTRAVENOUS
  Administered 2021-01-24: 100 ug via INTRAVENOUS

## 2021-01-24 MED ORDER — PHENYLEPHRINE HCL (PRESSORS) 10 MG/ML IV SOLN
INTRAVENOUS | Status: AC
  Filled 2021-01-24: qty 1

## 2021-01-24 MED ORDER — PHENYLEPHRINE 40 MCG/ML (10ML) SYRINGE FOR IV PUSH (FOR BLOOD PRESSURE SUPPORT)
PREFILLED_SYRINGE | INTRAVENOUS | Status: DC | PRN
  Administered 2021-01-24: 160 ug via INTRAVENOUS
  Administered 2021-01-24: 200 ug via INTRAVENOUS

## 2021-01-24 MED ORDER — DEXAMETHASONE SODIUM PHOSPHATE 10 MG/ML IJ SOLN
INTRAMUSCULAR | Status: AC
  Filled 2021-01-24: qty 1

## 2021-01-24 MED ORDER — LIDOCAINE 2% (20 MG/ML) 5 ML SYRINGE
INTRAMUSCULAR | Status: AC
  Filled 2021-01-24: qty 5

## 2021-01-24 MED ORDER — KETAMINE HCL 10 MG/ML IJ SOLN
INTRAMUSCULAR | Status: DC | PRN
  Administered 2021-01-24: 30 mg via INTRAVENOUS

## 2021-01-24 MED ORDER — MIDAZOLAM HCL 2 MG/2ML IJ SOLN
INTRAMUSCULAR | Status: AC
  Filled 2021-01-24: qty 2

## 2021-01-24 MED ORDER — OXYCODONE HCL 5 MG PO TABS
5.0000 mg | ORAL_TABLET | Freq: Once | ORAL | Status: AC
  Administered 2021-01-24: 5 mg via ORAL

## 2021-01-24 MED ORDER — SODIUM CHLORIDE (PF) 0.9 % IJ SOLN
INTRAMUSCULAR | Status: AC
  Filled 2021-01-24: qty 10

## 2021-01-24 MED ORDER — MIDAZOLAM HCL 2 MG/2ML IJ SOLN
INTRAMUSCULAR | Status: DC | PRN
  Administered 2021-01-24: 2 mg via INTRAVENOUS

## 2021-01-24 MED ORDER — KETOROLAC TROMETHAMINE 30 MG/ML IJ SOLN
INTRAMUSCULAR | Status: DC | PRN
  Administered 2021-01-24: 30 mg via INTRAVENOUS

## 2021-01-24 MED ORDER — ACETAMINOPHEN 10 MG/ML IV SOLN
INTRAVENOUS | Status: DC | PRN
  Administered 2021-01-24: 1000 mg via INTRAVENOUS

## 2021-01-24 MED ORDER — PHENYLEPHRINE 40 MCG/ML (10ML) SYRINGE FOR IV PUSH (FOR BLOOD PRESSURE SUPPORT)
PREFILLED_SYRINGE | INTRAVENOUS | Status: AC
  Filled 2021-01-24: qty 10

## 2021-01-24 MED ORDER — CEFAZOLIN SODIUM-DEXTROSE 2-4 GM/100ML-% IV SOLN
2.0000 g | INTRAVENOUS | Status: AC
  Administered 2021-01-24: 2 g via INTRAVENOUS
  Filled 2021-01-24: qty 100

## 2021-01-24 MED ORDER — CHLORHEXIDINE GLUCONATE 0.12 % MT SOLN
15.0000 mL | Freq: Once | OROMUCOSAL | Status: AC
  Administered 2021-01-24: 15 mL via OROMUCOSAL

## 2021-01-24 MED ORDER — ORAL CARE MOUTH RINSE: 15.0000 mL | Freq: Once | OROMUCOSAL | Status: AC

## 2021-01-24 MED ORDER — KETAMINE HCL 10 MG/ML IJ SOLN
INTRAMUSCULAR | Status: AC
  Filled 2021-01-24: qty 1

## 2021-01-24 MED ORDER — PROPOFOL 10 MG/ML IV BOLUS
INTRAVENOUS | Status: AC
  Filled 2021-01-24: qty 20

## 2021-01-24 MED ORDER — EPHEDRINE SULFATE-NACL 50-0.9 MG/10ML-% IV SOSY
PREFILLED_SYRINGE | INTRAVENOUS | Status: DC | PRN
  Administered 2021-01-24: 10 mg via INTRAVENOUS
  Administered 2021-01-24: 15 mg via INTRAVENOUS

## 2021-01-24 MED ORDER — LIDOCAINE HCL (CARDIAC) PF 100 MG/5ML IV SOSY
PREFILLED_SYRINGE | INTRAVENOUS | Status: DC | PRN
  Administered 2021-01-24: 60 mg via INTRAVENOUS

## 2021-01-24 MED ORDER — PHENYLEPHRINE HCL-NACL 20-0.9 MG/250ML-% IV SOLN
INTRAVENOUS | Status: DC | PRN
  Administered 2021-01-24: 25 ug/min via INTRAVENOUS

## 2021-01-24 MED ORDER — OXYCODONE HCL 5 MG PO TABS
ORAL_TABLET | ORAL | Status: AC
  Filled 2021-01-24: qty 1

## 2021-01-24 MED ORDER — CHLORHEXIDINE GLUCONATE CLOTH 2 % EX PADS: 6.0000 | MEDICATED_PAD | Freq: Once | CUTANEOUS | Status: DC

## 2021-01-24 MED ORDER — FENTANYL CITRATE (PF) 250 MCG/5ML IJ SOLN
INTRAMUSCULAR | Status: AC
  Filled 2021-01-24: qty 5

## 2021-01-24 MED ORDER — SCOPOLAMINE 1 MG/3DAYS TD PT72
MEDICATED_PATCH | TRANSDERMAL | Status: AC
  Filled 2021-01-24: qty 1

## 2021-01-24 MED ORDER — LACTATED RINGERS IV SOLN: INTRAVENOUS | Status: DC

## 2021-01-24 MED ORDER — 0.9 % SODIUM CHLORIDE (POUR BTL) OPTIME
TOPICAL | Status: DC | PRN
  Administered 2021-01-24: 1000 mL

## 2021-01-24 MED ORDER — ONDANSETRON HCL 4 MG/2ML IJ SOLN
INTRAMUSCULAR | Status: AC
  Filled 2021-01-24: qty 2

## 2021-01-24 MED ORDER — BUPIVACAINE LIPOSOME 1.3 % IJ SUSP
20.0000 mL | Freq: Once | INTRAMUSCULAR | Status: AC
  Administered 2021-01-24: 20 mL
  Filled 2021-01-24: qty 20

## 2021-01-24 MED ORDER — LACTATED RINGERS IR SOLN
Status: DC | PRN
  Administered 2021-01-24: 1000 mL

## 2021-01-24 MED ORDER — SUGAMMADEX SODIUM 200 MG/2ML IV SOLN
INTRAVENOUS | Status: DC | PRN
  Administered 2021-01-24: 200 mg via INTRAVENOUS

## 2021-01-24 MED ORDER — INDOCYANINE GREEN 25 MG IV SOLR
7.5000 mg | Freq: Once | INTRAVENOUS | Status: AC
  Administered 2021-01-24: 7.5 mg via INTRAVENOUS
  Filled 2021-01-24: qty 3

## 2021-01-24 MED ORDER — EPHEDRINE 5 MG/ML INJ
INTRAVENOUS | Status: AC
  Filled 2021-01-24: qty 5

## 2021-01-24 MED ORDER — ONDANSETRON HCL 4 MG/2ML IJ SOLN
INTRAMUSCULAR | Status: DC | PRN
  Administered 2021-01-24: 4 mg via INTRAVENOUS

## 2021-01-24 MED ORDER — ROCURONIUM BROMIDE 10 MG/ML (PF) SYRINGE
PREFILLED_SYRINGE | INTRAVENOUS | Status: AC
  Filled 2021-01-24: qty 10

## 2021-01-24 MED ORDER — SODIUM CHLORIDE (PF) 0.9 % IJ SOLN
INTRAMUSCULAR | Status: DC | PRN
  Administered 2021-01-24: 10 mL

## 2021-01-24 MED ORDER — DEXAMETHASONE SODIUM PHOSPHATE 10 MG/ML IJ SOLN
INTRAMUSCULAR | Status: DC | PRN
  Administered 2021-01-24: 8 mg via INTRAVENOUS

## 2021-01-24 MED ORDER — KETOROLAC TROMETHAMINE 30 MG/ML IJ SOLN
INTRAMUSCULAR | Status: AC
  Filled 2021-01-24: qty 1

## 2021-01-24 MED ORDER — PROPOFOL 10 MG/ML IV BOLUS
INTRAVENOUS | Status: DC | PRN
Start: 2021-01-24 — End: 2021-01-24
  Administered 2021-01-24: 160 mg via INTRAVENOUS

## 2021-01-24 MED ORDER — HYDROCODONE-ACETAMINOPHEN 5-325 MG PO TABS
1.0000 | ORAL_TABLET | Freq: Four times a day (QID) | ORAL | 0 refills | Status: DC | PRN
  Filled 2021-01-24: qty 15, 4d supply, fill #0

## 2021-01-24 MED ORDER — SCOPOLAMINE 1 MG/3DAYS TD PT72
MEDICATED_PATCH | TRANSDERMAL | Status: DC | PRN
  Administered 2021-01-24: 1 via TRANSDERMAL

## 2021-01-24 MED ORDER — ROCURONIUM BROMIDE 10 MG/ML (PF) SYRINGE
PREFILLED_SYRINGE | INTRAVENOUS | Status: DC | PRN
  Administered 2021-01-24: 10 mg via INTRAVENOUS
  Administered 2021-01-24: 60 mg via INTRAVENOUS
  Administered 2021-01-24: 10 mg via INTRAVENOUS

## 2021-01-24 SURGICAL SUPPLY — 49 items
APPLIER CLIP 5 13 M/L LIGAMAX5 (MISCELLANEOUS)
BLADE SURG 15 STRL LF DISP TIS (BLADE) ×1 IMPLANT
BLADE SURG 15 STRL SS (BLADE) ×1
CLIP APPLIE 5 13 M/L LIGAMAX5 (MISCELLANEOUS) IMPLANT
CLIP VESOLOCK LG 6/CT PURPLE (CLIP) ×2 IMPLANT
CLIP VESOLOCK MED LG 6/CT (CLIP) ×2 IMPLANT
COVER SURGICAL LIGHT HANDLE (MISCELLANEOUS) ×2 IMPLANT
COVER TIP SHEARS 8 DVNC (MISCELLANEOUS) ×1 IMPLANT
COVER TIP SHEARS 8MM DA VINCI (MISCELLANEOUS) ×1
DECANTER SPIKE VIAL GLASS SM (MISCELLANEOUS) ×2 IMPLANT
DERMABOND ADVANCED (GAUZE/BANDAGES/DRESSINGS) ×1
DERMABOND ADVANCED .7 DNX12 (GAUZE/BANDAGES/DRESSINGS) ×1 IMPLANT
DEVICE TROCAR PUNCTURE CLOSURE (ENDOMECHANICALS) ×2 IMPLANT
DRAPE ARM DVNC X/XI (DISPOSABLE) ×4 IMPLANT
DRAPE COLUMN DVNC XI (DISPOSABLE) ×1 IMPLANT
DRAPE DA VINCI XI ARM (DISPOSABLE) ×4
DRAPE DA VINCI XI COLUMN (DISPOSABLE) ×1
ELECT REM PT RETURN 15FT ADLT (MISCELLANEOUS) ×2 IMPLANT
GAUZE 4X4 16PLY ~~LOC~~+RFID DBL (SPONGE) IMPLANT
GLOVE SURG ENC TEXT LTX SZ8 (GLOVE) ×4 IMPLANT
GOWN STRL REUS W/TWL XL LVL3 (GOWN DISPOSABLE) ×6 IMPLANT
IRRIG SUCT STRYKERFLOW 2 WTIP (MISCELLANEOUS) ×2
IRRIGATION SUCT STRKRFLW 2 WTP (MISCELLANEOUS) ×1 IMPLANT
KIT BASIN OR (CUSTOM PROCEDURE TRAY) ×2 IMPLANT
KIT TURNOVER KIT A (KITS) ×2 IMPLANT
MANIFOLD NEPTUNE II (INSTRUMENTS) ×2 IMPLANT
MARKER SKIN DUAL TIP RULER LAB (MISCELLANEOUS) ×2 IMPLANT
NEEDLE HYPO 22GX1.5 SAFETY (NEEDLE) ×2 IMPLANT
OBTURATOR OPTICAL STANDARD 8MM (TROCAR) ×1
OBTURATOR OPTICAL STND 8 DVNC (TROCAR) ×1
OBTURATOR OPTICALSTD 8 DVNC (TROCAR) ×1 IMPLANT
PACK CARDIOVASCULAR III (CUSTOM PROCEDURE TRAY) ×2 IMPLANT
SCISSORS LAP 5X35 DISP (ENDOMECHANICALS) IMPLANT
SEAL CANN UNIV 5-8 DVNC XI (MISCELLANEOUS) ×4 IMPLANT
SEAL XI 5MM-8MM UNIVERSAL (MISCELLANEOUS) ×4
SEALER VESSEL DA VINCI XI (MISCELLANEOUS) ×1
SEALER VESSEL EXT DVNC XI (MISCELLANEOUS) ×1 IMPLANT
SLEEVE XCEL OPT CAN 5 100 (ENDOMECHANICALS) IMPLANT
SOL ANTI FOG 6CC (MISCELLANEOUS) ×1 IMPLANT
SOLUTION ANTI FOG 6CC (MISCELLANEOUS) ×1
SOLUTION ELECTROLUBE (MISCELLANEOUS) ×2 IMPLANT
SPONGE T-LAP 18X18 ~~LOC~~+RFID (SPONGE) ×2 IMPLANT
SUT MNCRL AB 4-0 PS2 18 (SUTURE) ×4 IMPLANT
SUT VICRYL 0 TIES 12 18 (SUTURE) ×2 IMPLANT
SYR 20ML LL LF (SYRINGE) ×2 IMPLANT
TOWEL OR 17X26 10 PK STRL BLUE (TOWEL DISPOSABLE) ×2 IMPLANT
TOWEL OR NON WOVEN STRL DISP B (DISPOSABLE) IMPLANT
TROCAR BLADELESS OPT 5 100 (ENDOMECHANICALS) ×2 IMPLANT
TUBING INSUFFLATION 10FT LAP (TUBING) ×2 IMPLANT

## 2021-01-24 NOTE — Anesthesia Procedure Notes (Signed)
Procedure Name: Intubation Date/Time: 01/24/2021 12:52 PM Performed by: Raenette Rover, CRNA Pre-anesthesia Checklist: Patient identified, Emergency Drugs available, Suction available and Patient being monitored Patient Re-evaluated:Patient Re-evaluated prior to induction Oxygen Delivery Method: Circle system utilized Preoxygenation: Pre-oxygenation with 100% oxygen Induction Type: IV induction Ventilation: Mask ventilation without difficulty Laryngoscope Size: Mac and 3 Grade View: Grade I Tube type: Oral Tube size: 7.0 mm Number of attempts: 1 Airway Equipment and Method: Stylet Placement Confirmation: ETT inserted through vocal cords under direct vision, positive ETCO2 and breath sounds checked- equal and bilateral Secured at: 21 cm Tube secured with: Tape Dental Injury: Teeth and Oropharynx as per pre-operative assessment

## 2021-01-24 NOTE — Anesthesia Preprocedure Evaluation (Signed)
Anesthesia Evaluation  Patient identified by MRN, date of birth, ID band Patient awake    Reviewed: Allergy & Precautions, NPO status , Patient's Chart, lab work & pertinent test results  Airway Mallampati: III  TM Distance: >3 FB Neck ROM: Full    Dental  (+) Dental Advisory Given   Pulmonary sleep apnea ,    breath sounds clear to auscultation       Cardiovascular hypertension, Pt. on medications and Pt. on home beta blockers  Rhythm:Regular Rate:Normal     Neuro/Psych  Headaches,  Neuromuscular disease    GI/Hepatic Neg liver ROS, GERD  ,  Endo/Other  diabetes  Renal/GU negative Renal ROS     Musculoskeletal  (+) Arthritis ,   Abdominal   Peds  Hematology  (+) Sickle cell trait ,   Anesthesia Other Findings   Reproductive/Obstetrics                             Lab Results  Component Value Date   WBC 7.3 01/23/2021   HGB 14.9 01/23/2021   HCT 47.4 (H) 01/23/2021   MCV 84.5 01/23/2021   PLT 352 01/23/2021   Lab Results  Component Value Date   CREATININE 0.76 01/23/2021   BUN 20 01/23/2021   NA 139 01/23/2021   K 3.7 01/23/2021   CL 110 01/23/2021   CO2 20 (L) 01/23/2021    Anesthesia Physical Anesthesia Plan  ASA: 2  Anesthesia Plan: General   Post-op Pain Management:    Induction: Intravenous  PONV Risk Score and Plan: 4 or greater and Ondansetron, Scopolamine patch - Pre-op, Midazolam, Dexamethasone and Treatment may vary due to age or medical condition  Airway Management Planned: Oral ETT  Additional Equipment: None  Intra-op Plan:   Post-operative Plan: Extubation in OR  Informed Consent: I have reviewed the patients History and Physical, chart, labs and discussed the procedure including the risks, benefits and alternatives for the proposed anesthesia with the patient or authorized representative who has indicated his/her understanding and acceptance.      Dental advisory given  Plan Discussed with: CRNA  Anesthesia Plan Comments:         Anesthesia Quick Evaluation

## 2021-01-24 NOTE — Op Note (Signed)
[place]  Primary Care Physician:  Mosie Lukes, MD    01/24/2021  3:07 PM  Procedure: Robotic Cholecystectomy with Firefly  Surgeon: Althea Grimmer. Hassell Done, MD, FACS Asst:  none  Anes:  General  Drains:  None  Findings: Intrahepatic gallbladder with normal anatomy  Description of Procedure: The patient was taken to OR 2 and given general anesthesia.  The patient was prepped with chlorohexidine prep and draped sterilely. A time out was performed including identifying the patient and discussing their procedure.  Access to the abdomen was achieved with a 5 mm Optiview through the left upper quadrant.  Port placement included the four 8 mm robotic trocars across the abdomen.  .    The gallbladder was visualized and appeared intrahepatic.   The fundus of the gallbaldder was grasped  with the tip up grasper and the gallbladder was elevated. Dissection revealed the cystic artery that was double clipped and divided.  The cystic duct funnel was followed down to the cystic duct and with Firefly the CBD could be seen.  Traction on the infundibulum allowed for successful demonstration of the critical view. Inflammatory changes were slight and chronic.  The cystic duct was triple clipped and divided.  The cystic duct was then triple clipped and divided, the cystic artery was double clipped and divided and then the gallbladder was removed from the gallbladder bed. Removal of the gallbladder from the gallbladder bed was performed without entering it.  The gallbladder was then placed in a bag and brought out through one of the 8 mm trocar sites. The gallbladder bed was inspected and no bleeding or bile leaks were seen.   The extraction site was closed with a single 0 vicryl with the PMI.  Incisions were injected with Exparel as a tap block before the case and closed with 4-0 Monocryl and Dermabond on the skin.  Sponge and needle count were correct.    The patient was taken to the recovery room in  satisfactory condition.   Matt B. Hassell Done, MD, FACS

## 2021-01-24 NOTE — Transfer of Care (Signed)
Immediate Anesthesia Transfer of Care Note  Patient: Mallorey Mulvaney  Procedure(s) Performed: XI ROBOTIC CHOLECYSTECTOMY (Abdomen)  Patient Location: PACU  Anesthesia Type:General  Level of Consciousness: drowsy  Airway & Oxygen Therapy: Patient Spontanous Breathing and Patient connected to face mask oxygen  Post-op Assessment: Report given to RN and Post -op Vital signs reviewed and stable  Post vital signs: Reviewed and stable  Last Vitals:  Vitals Value Taken Time  BP 115/66 01/24/21 1508  Temp    Pulse 62 01/24/21 1510  Resp 14 01/24/21 1510  SpO2 96 % 01/24/21 1510  Vitals shown include unvalidated device data.  Last Pain:  Vitals:   01/24/21 0929  PainSc: 0-No pain         Complications: No notable events documented.

## 2021-01-24 NOTE — Anesthesia Postprocedure Evaluation (Signed)
Anesthesia Post Note  Patient: Monique Wiggins  Procedure(s) Performed: XI ROBOTIC CHOLECYSTECTOMY (Abdomen)     Patient location during evaluation: PACU Anesthesia Type: General Level of consciousness: awake and alert Pain management: pain level controlled Vital Signs Assessment: post-procedure vital signs reviewed and stable Respiratory status: spontaneous breathing, nonlabored ventilation, respiratory function stable and patient connected to nasal cannula oxygen Cardiovascular status: blood pressure returned to baseline and stable Postop Assessment: no apparent nausea or vomiting Anesthetic complications: no   No notable events documented.  Last Vitals:  Vitals:   01/24/21 1740 01/24/21 1800  BP: 139/86 (!) 139/91  Pulse: 68 70  Resp: 16 16  Temp:    SpO2: 98% 97%    Last Pain:  Vitals:   01/24/21 1800  PainSc: 7                  Tiajuana Amass

## 2021-01-24 NOTE — Interval H&P Note (Signed)
History and Physical Interval Note:  01/24/2021 12:11 PM  Monique Wiggins  has presented today for surgery, with the diagnosis of BILIARY DYSKINESIA.  The various methods of treatment have been discussed with the patient and family. After consideration of risks, benefits and other options for treatment, the patient has consented to  Procedure(s): XI ROBOTIC [place] (N/A) as a surgical intervention.  The patient's history has been reviewed, patient examined, no change in status, stable for surgery.  I have reviewed the patient's chart and labs.  Questions were answered to the patient's satisfaction.     Pedro Earls

## 2021-01-25 LAB — SURGICAL PATHOLOGY

## 2021-02-06 DIAGNOSIS — Z7689 Persons encountering health services in other specified circumstances: Secondary | ICD-10-CM | POA: Diagnosis not present

## 2021-02-06 DIAGNOSIS — Z9049 Acquired absence of other specified parts of digestive tract: Secondary | ICD-10-CM | POA: Diagnosis not present

## 2021-02-07 ENCOUNTER — Other Ambulatory Visit (HOSPITAL_COMMUNITY): Payer: Self-pay

## 2021-02-07 MED ORDER — ONDANSETRON 4 MG PO TBDP
ORAL_TABLET | ORAL | 0 refills | Status: DC
  Filled 2021-02-07: qty 20, 7d supply, fill #0

## 2021-02-21 ENCOUNTER — Encounter: Payer: Self-pay | Admitting: Family Medicine

## 2021-02-21 ENCOUNTER — Other Ambulatory Visit: Payer: Self-pay

## 2021-02-21 ENCOUNTER — Ambulatory Visit: Payer: 59 | Admitting: Family Medicine

## 2021-02-21 VITALS — BP 116/84 | HR 74 | Temp 98.0°F | Resp 16 | Wt 184.6 lb

## 2021-02-21 DIAGNOSIS — T7840XD Allergy, unspecified, subsequent encounter: Secondary | ICD-10-CM

## 2021-02-21 DIAGNOSIS — R718 Other abnormality of red blood cells: Secondary | ICD-10-CM

## 2021-02-21 DIAGNOSIS — D582 Other hemoglobinopathies: Secondary | ICD-10-CM

## 2021-02-21 DIAGNOSIS — Z1231 Encounter for screening mammogram for malignant neoplasm of breast: Secondary | ICD-10-CM | POA: Diagnosis not present

## 2021-02-21 DIAGNOSIS — R0600 Dyspnea, unspecified: Secondary | ICD-10-CM | POA: Diagnosis not present

## 2021-02-21 DIAGNOSIS — Z23 Encounter for immunization: Secondary | ICD-10-CM | POA: Diagnosis not present

## 2021-02-21 DIAGNOSIS — R109 Unspecified abdominal pain: Secondary | ICD-10-CM | POA: Diagnosis not present

## 2021-02-21 DIAGNOSIS — E119 Type 2 diabetes mellitus without complications: Secondary | ICD-10-CM

## 2021-02-21 MED ORDER — MONTELUKAST SODIUM 10 MG PO TABS: 10.0000 mg | ORAL_TABLET | Freq: Every day | ORAL | 3 refills | Status: DC

## 2021-02-21 MED ORDER — HYOSCYAMINE SULFATE 0.125 MG PO TBDP: 0.1250 mg | ORAL_TABLET | Freq: Four times a day (QID) | ORAL | 1 refills | Status: DC | PRN

## 2021-02-21 NOTE — Patient Instructions (Signed)
Asthma, Adult °Asthma is a long-term (chronic) condition that causes recurrent episodes in which the airways become tight and narrow. The airways are the passages that lead from the nose and mouth down into the lungs. Asthma episodes, also called asthma attacks, can cause coughing, wheezing, shortness of breath, and chest pain. The airways can also fill with mucus. During an attack, it can be difficult to breathe. Asthma attacks can range from minor to life threatening. °Asthma cannot be cured, but medicines and lifestyle changes can help control it and treat acute attacks. °What are the causes? °This condition is believed to be caused by inherited (genetic) and environmental factors, but its exact cause is not known. °There are many things that can bring on an asthma attack or make asthma symptoms worse (triggers). Asthma triggers are different for each person. Common triggers include: °Mold. °Dust. °Cigarette smoke. °Cockroaches. °Things that can cause allergy symptoms (allergens), such as animal dander or pollen from trees or grass. °Air pollutants such as household cleaners, wood smoke, smog, or chemical odors. °Cold air, weather changes, and winds (which increase molds and pollen in the air). °Strong emotional expressions such as crying or laughing hard. °Stress. °Certain medicines (such as aspirin) or types of medicines (such as beta-blockers). °Sulfites in foods and drinks. Foods and drinks that may contain sulfites include dried fruit, potato chips, and sparkling grape juice. °Infections or inflammatory conditions such as the flu, a cold, or inflammation of the nasal membranes (rhinitis). °Gastroesophageal reflux disease (GERD). °Exercise or strenuous activity. °What are the signs or symptoms? °Symptoms of this condition may occur right after asthma is triggered or many hours later. Symptoms include: °Wheezing. This can sound like whistling when you breathe. °Excessive nighttime or early morning  coughing. °Frequent or severe coughing with a common cold. °Chest tightness. °Shortness of breath. °Tiredness (fatigue) with minimal activity. °How is this diagnosed? °This condition is diagnosed based on: °Your medical history. °A physical exam. °Tests, which may include: °Lung function studies and pulmonary studies (spirometry). These tests can evaluate the flow of air in your lungs. °Allergy tests. °Imaging tests, such as X-rays. °How is this treated? °There is no cure for this condition, but treatment can help control your symptoms. Treatment for asthma usually involves: °Identifying and avoiding your asthma triggers. °Using medicines to control your symptoms. Generally, two types of medicines are used to treat asthma: °Controller medicines. These help prevent asthma symptoms from occurring. They are usually taken every day. °Fast-acting reliever or rescue medicines. These quickly relieve asthma symptoms by widening the narrow and tight airways. They are used as needed and provide short-term relief. °Using supplemental oxygen. This may be needed during a severe episode. °Using other medicines, such as: °Allergy medicines, such as antihistamines, if your asthma attacks are triggered by allergens. °Immune medicines (immunomodulators). These are medicines that help control the immune system. °Creating an asthma action plan. An asthma action plan is a written plan for managing and treating your asthma attacks. This plan includes: °A list of your asthma triggers and how to avoid them. °Information about when medicines should be taken and when their dosage should be changed. °Instructions about using a device called a peak flow meter. A peak flow meter measures how well the lungs are working and the severity of your asthma. It helps you monitor your condition. °Follow these instructions at home: °Controlling your home environment °Control your home environment in the following ways to help avoid triggers and prevent  asthma attacks: °Change your heating   and air conditioning filter regularly. °Limit your use of fireplaces and wood stoves. °Get rid of pests (such as roaches and mice) and their droppings. °Throw away plants if you see mold on them. °Clean floors and dust surfaces regularly. Use unscented cleaning products. °Try to have someone else vacuum for you regularly. Stay out of rooms while they are being vacuumed and for a short while afterward. If you vacuum, use a dust mask from a hardware store, a double-layered or microfilter vacuum cleaner bag, or a vacuum cleaner with a HEPA filter. °Replace carpet with wood, tile, or vinyl flooring. Carpet can trap dander and dust. °Use allergy-proof pillows, mattress covers, and box spring covers. °Keep your bedroom a trigger-free room. °Avoid pets and keep windows closed when allergens are in the air. °Wash beddings every week in hot water and dry them in a dryer. °Use blankets that are made of polyester or cotton. °Clean bathrooms and kitchens with bleach. If possible, have someone repaint the walls in these rooms with mold-resistant paint. Stay out of the rooms that are being cleaned and painted. °Wash your hands often with soap and water. If soap and water are not available, use hand sanitizer. °Do not allow anyone to smoke in your home. °General instructions °Take over-the-counter and prescription medicines only as told by your health care provider. °Speak with your health care provider if you have questions about how or when to take the medicines. °Make note if you are requiring more frequent dosages. °Do not use any products that contain nicotine or tobacco, such as cigarettes and e-cigarettes. If you need help quitting, ask your health care provider. Also, avoid being exposed to secondhand smoke. °Use a peak flow meter as told by your health care provider. Record and keep track of the readings. °Understand and use the asthma action plan to help minimize, or stop an asthma  attack, without needing to seek medical care. °Make sure you stay up to date on your yearly vaccinations as told by your health care provider. This may include vaccines for the flu and pneumonia. °Avoid outdoor activities when allergen counts are high and when air quality is low. °Wear a ski mask that covers your nose and mouth during outdoor winter activities. Exercise indoors on cold days if you can. °Warm up before exercising, and take time for a cool-down period after exercise. °Keep all follow-up visits as told by your health care provider. This is important. °Where to find more information °For information about asthma, turn to the Centers for Disease Control and Prevention at www.cdc.gov/asthma/faqs °For air quality information, turn to [employer] at airnow.gov °Contact a health care provider if: °You have wheezing, shortness of breath, or a cough even while you are taking medicine to prevent attacks. °The mucus you cough up (sputum) is thicker than usual. °Your sputum changes from clear or white to yellow, green, gray, or bloody. °Your medicines are causing side effects, such as a rash, itching, swelling, or trouble breathing. °You need to use a reliever medicine more than 2-3 times a week. °Your peak flow reading is still at 50-79% of your personal best after following your action plan for 1 hour. °You have a fever. °Get help right away if: °You are getting worse and do not respond to treatment during an asthma attack. °You are short of breath when at rest or when doing very little physical activity. °You have difficulty eating, drinking, or talking. °You have chest pain or tightness. °You develop a fast heartbeat or   palpitations. °You have a bluish color to your lips or fingernails. °You are light-headed or dizzy, or you faint. °Your peak flow reading is less than 50% of your personal best. °You feel too tired to breathe normally. °Summary °Asthma is a long-term (chronic) condition that causes recurrent  episodes in which the airways become tight and narrow. These episodes can cause coughing, wheezing, shortness of breath, and chest pain. °Asthma cannot be cured, but medicines and lifestyle changes can help control it and treat acute attacks. °Make sure you understand how to avoid triggers and how and when to use your medicines. °Asthma attacks can range from minor to life threatening. Get help right away if you have an asthma attack and do not respond to treatment with your usual rescue medicines. °This information is not intended to replace advice given to you by your health care provider. Make sure you discuss any questions you have with your health care provider. °Document Revised: 03/04/2020 Document Reviewed: 10/07/2019 °[employer] Patient Education © 2022 [employer] ° °

## 2021-02-21 NOTE — Progress Notes (Signed)
Patient ID: Monique Wiggins, female    DOB: 1966/05/01  Age: 55 y.o. MRN: [id number]    Subjective:   Chief Complaint  Patient presents with   Follow-up   Diabetes   Subjective   HPI Monique Wiggins presents for office visit today for follow up on diabetes and recent cholecystectomy. She states that she feels gassy and still experiences hemorrhoids post-op. She expresses concern regarding her RBC levels. Denies CP/palp/HA/congestion/fevers/GI or GU c/o. Taking meds as prescribed.  Still has SOB, has albuterol inhaler that she uses when exercising or exerting effort. Does not wake her up. Some days better than others. She struggled with allergies in her childhood. She uses her inhaler about 3 times a day when at work or when doing yard work.  Review of Systems  Constitutional:  Negative for chills, fatigue and fever.  HENT:  Negative for congestion, rhinorrhea, sinus pressure, sinus pain and sore throat.   Eyes:  Negative for pain.  Respiratory:  Positive for shortness of breath. Negative for cough.   Cardiovascular:  Negative for chest pain, palpitations and leg swelling.  Gastrointestinal:  Negative for abdominal pain, blood in stool, diarrhea, nausea and vomiting.  Genitourinary:  Negative for decreased urine volume, flank pain, frequency, vaginal bleeding and vaginal discharge.  Musculoskeletal:  Negative for back pain.  Neurological:  Negative for headaches.   History Past Medical History:  Diagnosis Date   Allergy    Arthritis    Back pain    Carpal tunnel syndrome    Diabetes mellitus without complication (HCC)    DVT (deep venous thrombosis) ([place])    after long car trip   Dyspnea    Gastroparesis    GERD (gastroesophageal reflux disease)    H. pylori infection    1997   Heart murmur    Hypertension    Hypertriglyceridemia 02/22/2017   Hypertriglyceridemia 02/22/2017   Migraines    Sickle cell trait (HCC)    Sleep apnea    states she no longer  has OSA lost weight    She has a past surgical history that includes tummy tuck; Tonsillectomy; Toe Surgery; Abdominal hysterectomy; Carpometacarpel [facility]) suspension plasty (Right, 01/19/2019); and Carpal tunnel release (Right, 01/19/2019).   Her family history includes Allergic Disorder in her son and son; Asthma in her son; Cancer in her maternal grandfather and maternal grandmother; Diabetes in her father, mother, and sister; Diabetes Mellitus II in her father; Diabetes type II in her mother; High Cholesterol in her father; Hypertension in her father, mother, and sister; Pulmonary disease in her father; Pulmonary fibrosis in her father.She reports that she has never smoked. She has never used smokeless tobacco. She reports that she does not drink alcohol and does not use drugs.  Current Outpatient Medications on File Prior to Visit  Medication Sig Dispense Refill   albuterol (VENTOLIN HFA) 108 (90 Base) MCG/ACT inhaler Inhale 2 puffs into the lungs every 6 (six) hours as needed for wheezing or shortness of breath. 1 Inhaler 2   aluminum-magnesium hydroxide-simethicone (MAALOX) [id number] MG/5ML SUSP Take 30 mLs by mouth 3 (three) times daily with meals as needed. 355 mL 0   Ascorbic Acid (VITAMIN C PO) Take 1 capsule by mouth daily.     atorvastatin (LIPITOR) 10 MG tablet TAKE 1 TABLET BY MOUTH DAILY 90 tablet 4   carvedilol (COREG) 25 MG tablet TAKE 1 TABLET BY MOUTH 2 TIMES DAILY WITH A MEAL. 180 tablet 1   cetirizine (ZYRTEC) 10 MG tablet  Take 1 tablet (10 mg total) by mouth daily. (Patient taking differently: Take 10 mg by mouth daily as needed for allergies.) 30 tablet 11   Cyanocobalamin (B-12 SL) Place 1 capsule under the tongue daily.     dapagliflozin propanediol (FARXIGA) 10 MG TABS tablet TAKE 1 TABLET BY MOUTH ONCE A DAY BEFORE BREAKFAST 30 tablet 6   fluticasone (FLONASE) 50 MCG/ACT nasal spray Place 2 sprays into both nostrils daily. (Patient taking differently: Place 2 sprays into both  nostrils daily as needed for allergies.) 16 g 1   gabapentin (NEURONTIN) 300 MG capsule TAKE 1 CAPSULE BY MOUTH AT BEDTIME (Patient taking differently: Take 300 mg by mouth at bedtime.) 30 capsule 2   HYDROcodone-acetaminophen (NORCO/VICODIN) 5-325 MG tablet Take 1 tablet by mouth every 6 (six) hours as needed for moderate pain. 15 tablet 0   ibuprofen (ADVIL) 200 MG tablet Take 400 mg by mouth every 6 (six) hours as needed for headache or moderate pain.     linaclotide (LINZESS) 145 MCG CAPS capsule Take 1 capsule (145 mcg total) by mouth daily before breakfast. (Patient taking differently: Take 145 mcg by mouth daily as needed (constipation).) 30 capsule 2   losartan-hydrochlorothiazide (HYZAAR) 100-25 MG tablet TAKE 1 TABLET BY MOUTH ONCE A DAY 90 tablet 5   LOTEMAX 0.5 % ophthalmic suspension Place 1 drop into both eyes 4 (four) times daily as needed for irritation.     meloxicam (MOBIC) 15 MG tablet TAKE 1 TABLET(15 MG) BY MOUTH DAILY 30 tablet 0   metFORMIN (GLUCOPHAGE) 500 MG tablet Take 1 tablet (500 mg total) by mouth 2 (two) times daily with a meal. 180 tablet 1   mometasone (ELOCON) 0.1 % cream Apply 1 application topically daily. 45 g 2   Multiple Vitamin (MULTIVITAMIN WITH MINERALS) TABS tablet Take 1 tablet by mouth daily.     Multiple Vitamins-Minerals (ZINC PO) Take 1 tablet by mouth daily.     Omega-3 Fatty Acids (FISH OIL PO) Take 1 capsule by mouth daily.     ondansetron (ZOFRAN-ODT) 4 MG disintegrating tablet Dissolve 1 tablet by mouth every 8 hours as needed for nausea for up to 7 days 20 tablet 0   Polyvinyl Alcohol-Povidone (CLEAR EYES NATURAL TEARS OP) Place 1 drop into both eyes daily as needed (dry eyes).     potassium chloride SA (KLOR-CON) 20 MEQ tablet TAKE 1 TABLET BY MOUTH ONCE DAILY 90 tablet 5   topiramate (TOPAMAX) 50 MG tablet Take 1 tablet (50 mg total) by mouth daily. 90 tablet 1   tretinoin (RETIN-A) 0.025 % cream APPLY TO THE AFFECTED AREA(S) AT BEDTIME 45 g 2    VITAMIN E PO Take 1 capsule by mouth daily.     amLODipine (NORVASC) 5 MG tablet TAKE 1 TABLET BY MOUTH DAILY 90 tablet 3   No current facility-administered medications on file prior to visit.     Objective:  Objective  Physical Exam Constitutional:      General: She is not in acute distress.    Appearance: Normal appearance. She is not ill-appearing or toxic-appearing.  HENT:     Head: Normocephalic and atraumatic.     Right Ear: Tympanic membrane, ear canal and external ear normal.     Left Ear: Tympanic membrane, ear canal and external ear normal.     Nose: No congestion or rhinorrhea.  Eyes:     Extraocular Movements: Extraocular movements intact.     Pupils: Pupils are equal, round, and reactive to  light.  Cardiovascular:     Rate and Rhythm: Normal rate and regular rhythm.     Pulses: Normal pulses.     Heart sounds: Normal heart sounds. No murmur heard. Pulmonary:     Effort: Pulmonary effort is normal. No respiratory distress.     Breath sounds: Normal breath sounds. No wheezing, rhonchi or rales.  Abdominal:     General: Bowel sounds are normal.     Palpations: Abdomen is soft. There is no mass.     Tenderness: no abdominal tenderness There is no guarding.     Hernia: No hernia is present.  Musculoskeletal:        General: Normal range of motion.     Cervical back: Normal range of motion and neck supple.  Skin:    General: Skin is warm and dry.  Neurological:     Mental Status: She is alert and oriented to person, place, and time.  Psychiatric:        Behavior: Behavior normal.   BP 116/84   Pulse 74   Temp 98 F (36.7 C)   Resp 16   Wt 184 lb 9.6 oz (83.7 kg)   SpO2 98%   BMI 34.04 kg/m  Wt Readings from Last 3 Encounters:  02/21/21 184 lb 9.6 oz (83.7 kg)  01/24/21 180 lb 12.8 oz (82 kg)  01/23/21 180 lb 12.8 oz (82 kg)     Lab Results  Component Value Date   WBC 8.6 02/21/2021   HGB 14.2 02/21/2021   HCT 43.6 02/21/2021   PLT 383  02/21/2021   GLUCOSE 125 (H) 01/23/2021   CHOL 106 11/10/2020   TRIG 234.0 (H) 11/10/2020   HDL 45.80 11/10/2020   LDLDIRECT 20.0 11/10/2020   LDLCALC 25 12/03/2019   ALT 36 (H) 01/18/2021   AST 23 01/18/2021   NA 139 01/23/2021   K 3.7 01/23/2021   CL 110 01/23/2021   CREATININE 0.76 01/23/2021   BUN 20 01/23/2021   CO2 20 (L) 01/23/2021   TSH 0.96 11/10/2020   HGBA1C 7.1 (H) 01/23/2021   MICROALBUR 3.9 (H) 05/20/2019    No results found.   Assessment & Plan:  Plan    Meds ordered this encounter  Medications   hyoscyamine (ANASPAZ) 0.125 MG TBDP disintergrating tablet    Sig: Place 1 tablet (0.125 mg total) under the tongue every 6 (six) hours as needed for cramping.    Dispense:  30 tablet    Refill:  1   montelukast (SINGULAIR) 10 MG tablet    Sig: Take 1 tablet (10 mg total) by mouth at bedtime.    Dispense:  30 tablet    Refill:  3    Problem List Items Addressed This Visit     Diabetes mellitus without complication (HCC)    [id number] acceptable, minimize simple carbs. Increase exercise as tolerated. Continue current meds      Breast cancer screening by mammogram    MGM ordered today, given Shingrix and flu shot today      Relevant Orders   MM 3D SCREEN BREAST BILATERAL   Abdominal pain    Greatly improved s/p cholecystectomy. She does still note some episodes of abdominal cramping. Given Hyoscyamine to try and asked to add a multistrain probiotic.       Abnormal RBC - Primary    Improving, peripheral smear suggests possible iron deficiency will proceed with iron studies      Relevant Orders   Pathologist smear review (Completed)  CBC with Differential/Platelet (Completed)   Dyspnea    She notes with exertion but is happening more often. Check echo and consider cardiology referral as needed.      Relevant Orders   Ambulatory referral to Pulmonology   ECHOCARDIOGRAM COMPLETE   Allergies    Continue antihistamine, add Singulair and continue  Flonase      Other Visit Diagnoses     Abnormal hemoglobin (Hgb) ([place])       Relevant Orders   Pathologist smear review (Completed)   CBC with Differential/Platelet (Completed)   Need for influenza vaccination       Relevant Orders   Flu Vaccine QUAD 36+ mos IM (Fluarix, Fluzone & Afluria Quad PF (Completed)   Need for shingles vaccine       Relevant Orders   Varicella-zoster vaccine IM (Completed)       Follow-up: Return in about 3 months (around 05/23/2021).  I, Suezanne Jacquet, acting as a scribe for Penni Homans, MD, have documented all relevent documentation on behalf of Penni Homans, MD, as directed by Penni Homans, MD while in the presence of Penni Homans, MD. DO:02/22/21.  I, Mosie Lukes, MD personally performed the services described in this documentation. All medical record entries made by the scribe were at my direction and in my presence. I have reviewed the chart and agree that the record reflects my personal performance and is accurate and complete

## 2021-02-22 DIAGNOSIS — T7840XA Allergy, unspecified, initial encounter: Secondary | ICD-10-CM | POA: Insufficient documentation

## 2021-02-22 DIAGNOSIS — R0600 Dyspnea, unspecified: Secondary | ICD-10-CM | POA: Insufficient documentation

## 2021-02-22 DIAGNOSIS — R718 Other abnormality of red blood cells: Secondary | ICD-10-CM | POA: Insufficient documentation

## 2021-02-22 LAB — CBC WITH DIFFERENTIAL/PLATELET
Absolute Monocytes: 662 cells/uL (ref 200–950)
Basophils Absolute: 60 cells/uL (ref 0–200)
Basophils Relative: 0.7 %
Eosinophils Absolute: 361 cells/uL (ref 15–500)
Eosinophils Relative: 4.2 %
HCT: 43.6 % (ref 35.0–45.0)
Hemoglobin: 14.2 g/dL (ref 11.7–15.5)
Lymphs Abs: 2348 cells/uL (ref 850–3900)
MCH: 26.3 pg — ABNORMAL LOW (ref 27.0–33.0)
MCHC: 32.6 g/dL (ref 32.0–36.0)
MCV: 80.7 fL (ref 80.0–100.0)
MPV: 11.1 fL (ref 7.5–12.5)
Monocytes Relative: 7.7 %
Neutro Abs: 5169 cells/uL (ref 1500–7800)
Neutrophils Relative %: 60.1 %
Platelets: 383 10*3/uL (ref 140–400)
RBC: 5.4 10*6/uL — ABNORMAL HIGH (ref 3.80–5.10)
RDW: 14.1 % (ref 11.0–15.0)
Total Lymphocyte: 27.3 %
WBC: 8.6 10*3/uL (ref 3.8–10.8)

## 2021-02-22 LAB — PATHOLOGIST SMEAR REVIEW

## 2021-02-22 NOTE — Assessment & Plan Note (Signed)
hgba1c acceptable, minimize simple carbs. Increase exercise as tolerated. Continue current meds 

## 2021-02-22 NOTE — Assessment & Plan Note (Signed)
She notes with exertion but is happening more often. Check echo and consider cardiology referral as needed.

## 2021-02-22 NOTE — Assessment & Plan Note (Signed)
Improving, peripheral smear suggests possible iron deficiency will proceed with iron studies

## 2021-02-22 NOTE — Assessment & Plan Note (Signed)
Greatly improved s/p cholecystectomy. She does still note some episodes of abdominal cramping. Given Hyoscyamine to try and asked to add a multistrain probiotic.

## 2021-02-22 NOTE — Assessment & Plan Note (Signed)
Continue antihistamine, add Singulair and continue Flonase

## 2021-02-22 NOTE — Assessment & Plan Note (Addendum)
MGM ordered today, given Shingrix and flu shot today

## 2021-02-24 ENCOUNTER — Other Ambulatory Visit: Payer: Self-pay | Admitting: *Deleted

## 2021-02-24 ENCOUNTER — Encounter: Payer: Self-pay | Admitting: Gastroenterology

## 2021-02-24 ENCOUNTER — Other Ambulatory Visit: Payer: Self-pay | Admitting: Family Medicine

## 2021-02-24 ENCOUNTER — Ambulatory Visit (INDEPENDENT_AMBULATORY_CARE_PROVIDER_SITE_OTHER): Payer: 59 | Admitting: Gastroenterology

## 2021-02-24 ENCOUNTER — Other Ambulatory Visit (INDEPENDENT_AMBULATORY_CARE_PROVIDER_SITE_OTHER): Payer: 59

## 2021-02-24 ENCOUNTER — Other Ambulatory Visit (HOSPITAL_COMMUNITY): Payer: Self-pay

## 2021-02-24 VITALS — BP 122/82 | HR 62 | Ht 61.75 in | Wt 183.0 lb

## 2021-02-24 DIAGNOSIS — Z9049 Acquired absence of other specified parts of digestive tract: Secondary | ICD-10-CM

## 2021-02-24 DIAGNOSIS — D582 Other hemoglobinopathies: Secondary | ICD-10-CM

## 2021-02-24 DIAGNOSIS — K641 Second degree hemorrhoids: Secondary | ICD-10-CM

## 2021-02-24 DIAGNOSIS — K5900 Constipation, unspecified: Secondary | ICD-10-CM

## 2021-02-24 LAB — IBC + FERRITIN
Ferritin: 151.9 ng/mL (ref 10.0–291.0)
Iron: 64 ug/dL (ref 42–145)
Saturation Ratios: 17.4 % — ABNORMAL LOW (ref 20.0–50.0)
TIBC: 366.8 ug/dL (ref 250.0–450.0)
Transferrin: 262 mg/dL (ref 212.0–360.0)

## 2021-02-24 MED ORDER — AMLODIPINE BESYLATE 5 MG PO TABS
ORAL_TABLET | Freq: Every day | ORAL | 3 refills | Status: DC
  Filled 2021-02-24: qty 90, 90d supply, fill #0
  Filled 2021-06-01: qty 90, 90d supply, fill #1

## 2021-02-24 MED ORDER — CARVEDILOL 25 MG PO TABS
ORAL_TABLET | Freq: Two times a day (BID) | ORAL | 1 refills | Status: DC
  Filled 2021-02-24: qty 180, 90d supply, fill #0
  Filled 2021-06-01: qty 180, 90d supply, fill #1

## 2021-02-24 MED FILL — Potassium Chloride Microencapsulated Crys ER Tab 20 mEq: ORAL | 90 days supply | Qty: 90 | Fill #1 | Status: AC

## 2021-02-24 NOTE — Progress Notes (Signed)
HPI :  55 year old female here for follow-up visit.  She has a history of GERD, diabetes, suspected gastroparesis, and history of gallbladder dysfunction.  She is status post cholecystectomy on August 9.  She had an abnormal HIDA scan in the setting of upper abdominal pains which led to her cholecystectomy.  She states she got through the surgery well and has since been recovering.  Initially she had some loose stools following the surgery but that has since stopped and she is actually been on the more constipated side lately.  She states she has found significant relief from her upper abdominal discomfort following her cholecystectomy and is happy with that.  Her main complaint has been symptomatic hemorrhoids over the past few months that has been causing her problems.  She states she has superficial bleeding and significant irritation for the past few months.  She has been using sitz bath's as needed.  She has been using MiraLAX more recently to keep the stools soft and try to prevent straining when she has had some straining.  She inquires about other options.  She is not taking any narcotics anymore post operatively.  She has been using some Motrin as needed, not using any PPI.  She has no anemia but had iron studies ordered.  She has not had blood work yet.  MCV around 80.  She is prescribed Linzess but has not been taking it.  She thinks MiraLAX works okay when she takes it.     EGD 06/2020:   - Esophagogastric landmarks identified. - Normal esophagus otherwise - Diffusely granular gastric mucosa with patchy erythema. Biopsied to rule out H pylori. - Normal stomach otherwise - Nodule found in the duodenum, suspect could be prominent / normal variant ampulla. Biopsied. - Normal duodenum otherwise   1. Surgical [P], duodenal nodule - PEPTIC DUODENITIS. - NO DYSPLASIA OR MALIGNANCY. 2. Surgical [P], gastric antrum and gastric body - REACTIVE GASTROPATHY. [place] IS NEGATIVE FOR  HELICOBACTER PYLORI. - NO INTESTINAL METAPLASIA, DYSPLASIA, OR MALIGNANCY     Prior workup: RUQ [name] - 05/27/19 - normal gallbladder, no gallstones, fatty liver without obvious cirrhosis     Colonoscopy 05/03/2017 - The perianal and digital rectal examinations were normal. - The terminal ileum appeared normal. - A 4 mm polyp was found in the transverse colon. The polyp was flat. The polyp was removed with a cold snare. Resection and retrieval were complete. - Two sessile polyps were found in the recto-sigmoid colon. The polyps were 3 to 4 mm in size. These polyps were removed with a cold snare. Resection and retrieval were complete. - A few small-mouthed diverticula were found in the ascending colon. - The exam was otherwise without abnormality.   Surgical [P], transverse and cecum, polyp (3) - HYPERPLASTIC POLYP(S). - THERE IS NO EVIDENCE OF MALIGNANCY.    MRI abdomen 11/15/20: IMPRESSION: 1. Moderate hepatic steatosis with areas of focal fatty sparing along the gallbladder fossa which correspond with lesions seen on prior ultrasound. No suspicious hepatic lesion. 2. Benign splenic hemangiomas. 3. Small bilateral renal cysts.   HIDA 01/04/21 - EF 11%   Past Medical History:  Diagnosis Date   Allergy    Arthritis    Back pain    Carpal tunnel syndrome    Diabetes mellitus without complication (HCC)    DVT (deep venous thrombosis) ([place])    after long car trip   Dyspnea    Gastroparesis    GERD (gastroesophageal reflux disease)  H. pylori infection    1997   Heart murmur    Hypertension    Hypertriglyceridemia 02/22/2017   Hypertriglyceridemia 02/22/2017   Migraines    Sickle cell trait (HCC)    Sleep apnea    states she no longer has OSA lost weight     Past Surgical History:  Procedure Laterality Date   ABDOMINAL HYSTERECTOMY     ovaries left in place   CARPAL TUNNEL RELEASE Right 01/19/2019   Procedure: ENDOSCOPIC CARPAL TUNNEL RELEASE;  Surgeon: Milly Jakob, MD;  Location: [place];  Service: Orthopedics;  Laterality: Right;   CARPOMETACARPEL SUSPENSION PLASTY Right 01/19/2019   Procedure: RIGHT THUMB SUSPENSION ARTHROPLASTY;  Surgeon: Milly Jakob, MD;  Location: [place];  Service: Orthopedics;  Laterality: Right;  PREOP BLOCK   CHOLECYSTECTOMY  01/24/2021   TOE SURGERY     left great toe, repair of severed tendon   TONSILLECTOMY     tummy tuck     Family History  Problem Relation Age of Onset   Diabetes type II Mother    Hypertension Mother    Diabetes Mother    Diabetes Mellitus II Father    Pulmonary fibrosis Father    Diabetes Father    Hypertension Father    Pulmonary disease Father    High Cholesterol Father    Hypertension Sister    Diabetes Sister    Cancer Maternal Grandmother        colon   Allergic Disorder Son    Cancer Maternal Grandfather        lung, smoker   Allergic Disorder Son    Asthma Son    Colon cancer Neg Hx    Esophageal cancer Neg Hx    Pancreatic cancer Neg Hx    Stomach cancer Neg Hx    Rectal cancer Neg Hx    Social History   Tobacco Use   Smoking status: Never   Smokeless tobacco: Never  Vaping Use   Vaping Use: Never used  Substance Use Topics   Alcohol use: No    Comment: never   Drug use: No   Current Outpatient Medications  Medication Sig Dispense Refill   albuterol (VENTOLIN HFA) 108 (90 Base) MCG/ACT inhaler Inhale 2 puffs into the lungs every 6 (six) hours as needed for wheezing or shortness of breath. 1 Inhaler 2   aluminum-magnesium hydroxide-simethicone (MAALOX) [id number] MG/5ML SUSP Take 30 mLs by mouth 3 (three) times daily with meals as needed. 355 mL 0   Ascorbic Acid (VITAMIN C PO) Take 1 capsule by mouth daily.     atorvastatin (LIPITOR) 10 MG tablet TAKE 1 TABLET BY MOUTH DAILY 90 tablet 4   carvedilol (COREG) 25 MG tablet TAKE 1 TABLET BY MOUTH 2 TIMES DAILY WITH A MEAL. 180 tablet 1   cetirizine (ZYRTEC) 10 MG tablet Take  1 tablet (10 mg total) by mouth daily. (Patient taking differently: Take 10 mg by mouth daily as needed for allergies.) 30 tablet 11   Cyanocobalamin (B-12 SL) Place 1 capsule under the tongue daily.     dapagliflozin propanediol (FARXIGA) 10 MG TABS tablet TAKE 1 TABLET BY MOUTH ONCE A DAY BEFORE BREAKFAST 30 tablet 6   fluticasone (FLONASE) 50 MCG/ACT nasal spray Place 2 sprays into both nostrils daily. (Patient taking differently: Place 2 sprays into both nostrils daily as needed for allergies.) 16 g 1   gabapentin (NEURONTIN) 300 MG capsule TAKE 1 CAPSULE BY MOUTH AT BEDTIME (Patient  taking differently: Take 300 mg by mouth at bedtime.) 30 capsule 2   HYDROcodone-acetaminophen (NORCO/VICODIN) 5-325 MG tablet Take 1 tablet by mouth every 6 (six) hours as needed for moderate pain. 15 tablet 0   hyoscyamine (ANASPAZ) 0.125 MG TBDP disintergrating tablet Place 1 tablet (0.125 mg total) under the tongue every 6 (six) hours as needed for cramping. 30 tablet 1   ibuprofen (ADVIL) 200 MG tablet Take 400 mg by mouth every 6 (six) hours as needed for headache or moderate pain.     linaclotide (LINZESS) 145 MCG CAPS capsule Take 1 capsule (145 mcg total) by mouth daily before breakfast. (Patient taking differently: Take 145 mcg by mouth daily as needed (constipation).) 30 capsule 2   losartan-hydrochlorothiazide (HYZAAR) 100-25 MG tablet TAKE 1 TABLET BY MOUTH ONCE A DAY 90 tablet 5   LOTEMAX 0.5 % ophthalmic suspension Place 1 drop into both eyes 4 (four) times daily as needed for irritation.     meloxicam (MOBIC) 15 MG tablet TAKE 1 TABLET(15 MG) BY MOUTH DAILY 30 tablet 0   metFORMIN (GLUCOPHAGE) 500 MG tablet Take 1 tablet (500 mg total) by mouth 2 (two) times daily with a meal. 180 tablet 1   mometasone (ELOCON) 0.1 % cream Apply 1 application topically daily. 45 g 2   montelukast (SINGULAIR) 10 MG tablet Take 1 tablet (10 mg total) by mouth at bedtime. 30 tablet 3   Multiple Vitamin (MULTIVITAMIN WITH  MINERALS) TABS tablet Take 1 tablet by mouth daily.     Multiple Vitamins-Minerals (ZINC PO) Take 1 tablet by mouth daily.     Omega-3 Fatty Acids (FISH OIL PO) Take 1 capsule by mouth daily.     ondansetron (ZOFRAN-ODT) 4 MG disintegrating tablet Dissolve 1 tablet by mouth every 8 hours as needed for nausea for up to 7 days 20 tablet 0   Polyvinyl Alcohol-Povidone (CLEAR EYES NATURAL TEARS OP) Place 1 drop into both eyes daily as needed (dry eyes).     potassium chloride SA (KLOR-CON) 20 MEQ tablet TAKE 1 TABLET BY MOUTH ONCE DAILY 90 tablet 5   topiramate (TOPAMAX) 50 MG tablet Take 1 tablet (50 mg total) by mouth daily. 90 tablet 1   tretinoin (RETIN-A) 0.025 % cream APPLY TO THE AFFECTED AREA(S) AT BEDTIME 45 g 2   VITAMIN E PO Take 1 capsule by mouth daily.     amLODipine (NORVASC) 5 MG tablet TAKE 1 TABLET BY MOUTH DAILY 90 tablet 3   No current facility-administered medications for this visit.   Allergies  Allergen Reactions   Pollen Extract Swelling   Shellfish Allergy Swelling     Review of Systems: All systems reviewed and negative except where noted in HPI.    Lab Results  Component Value Date   WBC 8.6 02/21/2021   HGB 14.2 02/21/2021   HCT 43.6 02/21/2021   MCV 80.7 02/21/2021   PLT 383 02/21/2021    Lab Results  Component Value Date   CREATININE 0.76 01/23/2021   BUN 20 01/23/2021   NA 139 01/23/2021   K 3.7 01/23/2021   CL 110 01/23/2021   CO2 20 (L) 01/23/2021     Physical Exam: BP 122/82   Pulse 62   Ht 5' 1.75" (1.568 m)   Wt 183 lb (83 kg)   BMI 33.74 kg/m  Constitutional: Pleasant,well-developed, female in no acute distress. DRE / Anoscopy - no fissure. No mass lesions. Moderate internal hemorrhoids noted in all positions, most prominent in RP area.  CMA Tia Alert as standby Neurological: Alert and oriented to person place and time.  Psychiatric: Normal mood and affect. Behavior is normal.   ASSESSMENT AND PLAN: 55 year old female here for  reassessment following:  Grade 2 hemorrhoids Constipation Postcholecystectomy  Generally has been recovering well post cholecystectomy and her upper abdominal symptoms are significantly improved.  She had some loose stools immediately postoperatively but now more so constipated.  Has been having some symptomatic hemorrhoids for the past few months not really following her to include irritation and bleeding.  We discussed options.  I did an anoscopy today and she does have inflamed internal hemorrhoids, worse in the RP area.  I offered her hemorrhoid banding and discussed risks benefits of that.  We also discussed other options to include medical therapy and surgical referral.  After discussion of options she wants to proceed with banding, the RP hemorrhoid was banded today.  She will continue MiraLAX for constipation I would like to see her back in 2 to 4 weeks for another banding to complete the series of 3.  She agrees with the plan, she will follow-up to get her iron studies checked as per Dr. Randel Pigg, if she does have an iron deficiency, given her postmenopausal state she will warrant a colonoscopy  Plan: - RP hemorrhoid banded - Miralax daily and titrate up as needed - await iron studies  [name], MD [facility]     PROCEDURE NOTE: The patient presents with symptomatic grade II  hemorrhoids, requesting rubber band ligation of his/her hemorrhoidal disease.  All risks, benefits and alternative forms of therapy were described and informed consent was obtained.  In the Left Lateral Decubitus position anoscopic examination revealed grade II hemorrhoids in the all position(s), most prominent in RP area The anorectum was pre-medicated with 0.125% nitroglycerin The decision was made to band the RP internal hemorrhoid, and the N[place]bitt was used to perform band ligation without complication.  Digital anorectal examination was then performed to assure proper  positioning of the band, and to adjust the banded tissue as required.  The patient was discharged home without pain or other issues.  Dietary and behavioral recommendations were given and along with follow-up instructions.     The following adjunctive treatments were recommended: Miralax daily  The patient will return in 2-4 weeks for  follow-up and possible additional banding as required. No complications were encountered and the patient tolerated the procedure well.

## 2021-02-24 NOTE — Patient Instructions (Addendum)
If you are age 56 or older, your body mass index should be between 23-30. Your Body mass index is 33.74 kg/m. If this is out of the aforementioned range listed, please consider follow up with your Primary Care Provider.  If you are age 54 or younger, your body mass index should be between 19-25. Your Body mass index is 33.74 kg/m. If this is out of the aformentioned range listed, please consider follow up with your Primary Care Provider.   __________________________________________________________  The [facility] providers would like to encourage you to use [facility] to communicate with providers for non-urgent requests or questions.  Due to long hold times on the telephone, sending your provider a message by [facility] may be a faster and more efficient way to get a response.  Please allow 48 business hours for a response.  Please remember that this is for non-urgent requests.    HEMORRHOID BANDING PROCEDURE    FOLLOW-UP CARE   The procedure you have had should have been relatively painless since the banding of the area involved does not have nerve endings and there is no pain sensation.  The rubber band cuts off the blood supply to the hemorrhoid and the band may fall off as soon as 48 hours after the banding (the band may occasionally be seen in the toilet bowl following a bowel movement). You may notice a temporary feeling of fullness in the rectum which should respond adequately to plain Tylenol or Motrin.  Following the banding, avoid strenuous exercise that evening and resume full activity the next day.  A sitz bath (soaking in a warm tub) or bidet is soothing, and can be useful for cleansing the area after bowel movements.     To avoid constipation, take two tablespoons of natural wheat bran, natural oat bran, flax, Benefiber or any over the counter fiber supplement and increase your water intake to 7-8 glasses daily.    Unless you have been prescribed anorectal medication, do not put  anything inside your rectum for two weeks: No suppositories, enemas, fingers, etc.  Occasionally, you may have more bleeding than usual after the banding procedure.  This is often from the untreated hemorrhoids rather than the treated one.  Don't be concerned if there is a tablespoon or so of blood.  If there is more blood than this, lie flat with your bottom higher than your head and apply an ice pack to the area. If the bleeding does not stop within a half an hour or if you feel faint, call our office at [phone] or go to the emergency room.  Problems are not common; however, if there is a substantial amount of bleeding, severe pain, chills, fever or difficulty passing urine (very rare) or other problems, you should call us at [phone] or report to the nearest emergency room.  Do not stay seated continuously for more than 2-3 hours for a day or two after the procedure.  Tighten your buttock muscles 10-15 times every two hours and take 10-15 deep breaths every 1-2 hours.  Do not spend more than a few minutes on the toilet if you cannot empty your bowel; instead re-visit the toilet at a later time.   You have been scheduled for a 2nd hemorrhoidal banding on Wednesday, 9-28 at 4:00pm. Please call asap if you need to reschedule this appointment.  Use Miralax: Take once daily  Thank you for entrusting me with your care and for choosing [facility], Dr. Durand Cellar

## 2021-02-27 ENCOUNTER — Telehealth: Payer: Self-pay | Admitting: *Deleted

## 2021-02-27 NOTE — Telephone Encounter (Signed)
-----   Message from Mosie Lukes, MD sent at 02/26/2021  9:14 PM EDT ----- Labs stable no new concerns, no changes. She should be taking a multivitamin with iron daily iron level is normal but low normal

## 2021-02-27 NOTE — Telephone Encounter (Signed)
Patient notified of lab results.  Patient states that she takes multivitamin with iron already every day.  She spoke with her gastroenterologist and he thinks that her body may not be absorbing iron.  He is going to be doing a colonoscopy because she has been taking a lot of ibuprofen.

## 2021-02-28 NOTE — Telephone Encounter (Signed)
Patient notified

## 2021-03-13 NOTE — Progress Notes (Signed)
Name: Monique Wiggins  MRN/ DOB: [id number], 1966-02-06   Age/ Sex: 55 y.o., female    PCP: Mosie Lukes, MD   Reason for Endocrinology Evaluation: Type 2 Diabetes Mellitus     Date of Initial Endocrinology Visit: 03/14/2021     PATIENT IDENTIFIER: Monique Wiggins is a 55 y.o. female with a past medical history of T2DM. The patient presented for initial endocrinology clinic visit on 03/14/2021 for consultative assistance with her diabetes management.    HPI: [name] is accompanied by her spouse today    Diagnosed with DM 3 yrs ago  Prior Medications tried/Intolerance: N/A Currently checking blood sugars occasionally  Hypoglycemia episodes : no            Hemoglobin A1c has ranged from 6.9% in 2021, peaking at 8.8% in 2020. 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   Son with Berenice Primas' disease   HOME DIABETES REGIMEN: Farxiga 10 mg daily  Metformin 500 mg BID      Statin: yes ACE-I/ARB: yes Prior Diabetic Education: no    METER DOWNLOAD SUMMARY: Did not bring    DIABETIC COMPLICATIONS: Microvascular complications:   Denies: CKD, neuropathy , retinopathy Last eye exam: Completed 11/2020  Macrovascular complications:   Denies: CAD, PVD, CVA   PAST HISTORY: Past Medical History:  Past Medical History:  Diagnosis Date   Allergy    Arthritis    Back pain    Carpal tunnel syndrome    Diabetes mellitus without complication ([place])    DVT (deep venous thrombosis) ([place])    after long car trip   Dyspnea    Gastroparesis    GERD  (gastroesophageal reflux disease)    H. pylori infection    1997   Heart murmur    Hypertension    Hypertriglyceridemia 02/22/2017   Hypertriglyceridemia 02/22/2017   Migraines    Sickle cell trait ([place])    Sleep apnea    states she no longer has OSA lost weight   Past Surgical History:  Past Surgical History:  Procedure Laterality Date   ABDOMINAL HYSTERECTOMY     ovaries left in place   CARPAL TUNNEL RELEASE Right 01/19/2019   Procedure: ENDOSCOPIC CARPAL TUNNEL RELEASE;  Surgeon: Milly Jakob, MD;  Location: [place];  Service: Orthopedics;  Laterality: Right;   CARPOMETACARPEL SUSPENSION PLASTY Right 01/19/2019   Procedure: RIGHT THUMB SUSPENSION ARTHROPLASTY;  Surgeon: Milly Jakob, MD;  Location: [place];  Service: Orthopedics;  Laterality: Right;  PREOP BLOCK   CHOLECYSTECTOMY  01/24/2021   TOE SURGERY     left great toe, repair of severed tendon   TONSILLECTOMY     tummy tuck      Social History:  reports that she has never smoked. She has never used smokeless tobacco. She reports that she does not drink alcohol and does not use drugs. Family History:  Family History  Problem Relation Age of Onset   Diabetes type II Mother    Hypertension Mother    Diabetes Mother    Diabetes Mellitus II  Father    Pulmonary fibrosis Father    Diabetes Father    Hypertension Father    Pulmonary disease Father    High Cholesterol Father    Hypertension Sister    Diabetes Sister    Cancer Maternal Grandmother        colon   Allergic Disorder Son    Cancer Maternal Grandfather        lung, smoker   Allergic Disorder Son    Asthma Son    Colon cancer Neg Hx    Esophageal cancer Neg Hx    Pancreatic cancer Neg Hx    Stomach cancer Neg Hx    Rectal cancer Neg Hx      HOME MEDICATIONS: Allergies as of 03/14/2021       Reactions   Pollen Extract Swelling   Shellfish Allergy Swelling        Medication List        Accurate as  of March 14, 2021  8:28 AM. If you have any questions, ask your nurse or doctor.          albuterol 108 (90 Base) MCG/ACT inhaler Commonly known as: VENTOLIN HFA Inhale 2 puffs into the lungs every 6 (six) hours as needed for wheezing or shortness of breath.   aluminum-magnesium hydroxide-simethicone 681-157-26 MG/5ML Susp Commonly known as: MAALOX Take 30 mLs by mouth 3 (three) times daily with meals as needed.   amLODipine 5 MG tablet Commonly known as: NORVASC TAKE 1 TABLET BY MOUTH DAILY   atorvastatin 10 MG tablet Commonly known as: LIPITOR TAKE 1 TABLET BY MOUTH DAILY   B-12 SL Place 1 capsule under the tongue daily.   carvedilol 25 MG tablet Commonly known as: COREG TAKE 1 TABLET BY MOUTH 2 TIMES DAILY WITH A MEAL.   cetirizine 10 MG tablet Commonly known as: ZYRTEC Take 1 tablet (10 mg total) by mouth daily. What changed:  when to take this reasons to take this   CLEAR EYES NATURAL TEARS OP Place 1 drop into both eyes daily as needed (dry eyes).   Farxiga 10 MG Tabs tablet Generic drug: dapagliflozin propanediol TAKE 1 TABLET BY MOUTH ONCE A DAY BEFORE BREAKFAST   FISH OIL PO Take 1 capsule by mouth daily.   fluticasone 50 MCG/ACT nasal spray Commonly known as: FLONASE Place 2 sprays into both nostrils daily. What changed:  when to take this reasons to take this   gabapentin 300 MG capsule Commonly known as: NEURONTIN TAKE 1 CAPSULE BY MOUTH AT BEDTIME What changed:  how much to take how to take this when to take this   HYDROcodone-acetaminophen 5-325 MG tablet Commonly known as: NORCO/VICODIN Take 1 tablet by mouth every 6 (six) hours as needed for moderate pain.   hyoscyamine 0.125 MG Tbdp disintergrating tablet Commonly known as: ANASPAZ Place 1 tablet (0.125 mg total) under the tongue every 6 (six) hours as needed for cramping.   ibuprofen 200 MG tablet Commonly known as: ADVIL Take 400 mg by mouth every 6 (six) hours as needed  for headache or moderate pain.   linaclotide 145 MCG Caps capsule Commonly known as: LINZESS Take 1 capsule (145 mcg total) by mouth daily before breakfast. What changed:  when to take this reasons to take this   lisinopril-hydrochlorothiazide 20-25 MG tablet Commonly known as: ZESTORETIC Take 1 tablet by mouth daily.   losartan-hydrochlorothiazide 100-25 MG tablet Commonly known as: HYZAAR TAKE 1 TABLET BY MOUTH ONCE A DAY   Lotemax 0.5 %  ophthalmic suspension Generic drug: loteprednol Place 1 drop into both eyes 4 (four) times daily as needed for irritation.   meloxicam 15 MG tablet Commonly known as: MOBIC TAKE 1 TABLET(15 MG) BY MOUTH DAILY   metFORMIN 500 MG tablet Commonly known as: GLUCOPHAGE Take 1 tablet (500 mg total) by mouth 2 (two) times daily with a meal.   mometasone 0.1 % cream Commonly known as: ELOCON Apply 1 application topically daily.   montelukast 10 MG tablet Commonly known as: SINGULAIR Take 1 tablet (10 mg total) by mouth at bedtime.   multivitamin with minerals Tabs tablet Take 1 tablet by mouth daily.   ondansetron 4 MG disintegrating tablet Commonly known as: ZOFRAN-ODT Dissolve 1 tablet by mouth every 8 hours as needed for nausea for up to 7 days   potassium chloride SA 20 MEQ tablet Commonly known as: KLOR-CON TAKE 1 TABLET BY MOUTH ONCE DAILY   topiramate 50 MG tablet Commonly known as: TOPAMAX Take 1 tablet (50 mg total) by mouth daily.   tretinoin 0.025 % cream Commonly known as: RETIN-A APPLY TO THE AFFECTED AREA(S) AT BEDTIME   VITAMIN C PO Take 1 capsule by mouth daily.   VITAMIN E PO Take 1 capsule by mouth daily.   ZINC PO Take 1 tablet by mouth daily.         ALLERGIES: Allergies  Allergen Reactions   Pollen Extract Swelling   Shellfish Allergy Swelling     REVIEW OF SYSTEMS: A comprehensive ROS was conducted with the patient and is negative except as per HPI and below:  Review of Systems  Eyes:   Positive for blurred vision.  Gastrointestinal:  Positive for diarrhea and nausea. Negative for vomiting.     OBJECTIVE:   VITAL SIGNS: BP 140/82 (BP Location: Right Arm, Patient Position: Sitting, Cuff Size: Small)   Pulse 88   Ht 5' 1.75" (1.568 m)   Wt 187 lb (84.8 kg)   SpO2 98%   BMI 34.48 kg/m    PHYSICAL EXAM:  General: Pt appears well and is in NAD  Neck: General: Supple without adenopathy or carotid bruits. Thyroid: Thyroid size normal.  No goiter or nodules appreciated.  Lungs: Clear with good BS bilat with no rales, rhonchi, or wheezes  Heart: RRR with normal S1 and S2 and no gallops; no murmurs; no rub  Abdomen: Normoactive bowel sounds, soft, nontender, without masses or organomegaly palpable  Extremities:  Lower extremities - No pretibial edema. No lesions.  Skin: Normal texture and temperature to palpation. No rash noted. No Acanthosis nigricans/skin tags. No lipohypertrophy.  Neuro: MS is good with appropriate affect, pt is alert and Ox3    DM foot exam: 03/14/2021  The skin of the feet is intact without sores or ulcerations. The pedal pulses are 2+ on right and 2+ on left. The sensation is intact to a screening 5.07, 10 gram monofilament bilaterally   DATA REVIEWED:  Lab Results  Component Value Date   HGBA1C 7.1 (H) 01/23/2021   HGBA1C 7.3 (H) 11/10/2020   HGBA1C 6.9 (H) 12/03/2019   Lab Results  Component Value Date   MICROALBUR 3.9 (H) 05/20/2019   LDLCALC 25 12/03/2019   CREATININE 0.76 01/23/2021   Lab Results  Component Value Date   MICRALBCREAT 2.2 05/20/2019    Lab Results  Component Value Date   CHOL 106 11/10/2020   HDL 45.80 11/10/2020   LDLCALC 25 12/03/2019   LDLDIRECT 20.0 11/10/2020   TRIG 234.0 (H) 11/10/2020  CHOLHDL 2 11/10/2020       Results for KATTI, PELLE (MRN [id number]) as of 03/14/2021 13:08  Ref. Range 03/14/2021 09:06  TSH Latest Ref Range: 0.35 - 5.50 uIU/mL 1.74  T4,Free(Direct) Latest Ref Range:  0.60 - 1.60 ng/dL 0.77       In Office BG 186 mg/dL    CT Orbits 09/22/2020  Periorbital soft tissues: Normal.   Globes: Bilateral proptosis, mild. Symmetrical, normal contour and density.   Optic nerves: Symmetrical, normal size and contour.   Extraocular muscles: Symmetrical, normal size.   Contrast: No abnormal enhancement to suggest mass or infection. Medialization with retropharyngeal course of the LEFT internal carotid artery.   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  ASSESSMENT / PLAN / RECOMMENDATIONS:   1) Type 2 Diabetes Mellitus, Optimally controlled, Without complications - Most recent A1c of 7.1 %. Goal A1c < 7.0 %.    Plan: GENERAL: I have discussed with the patient the pathophysiology of diabetes. We went over the natural progression of the disease. We stressed the importance of lifestyle changes including low carb diet . I explained the complications associated with diabetes including retinopathy, nephropathy, neuropathy as well as increased risk of cardiovascular disease. We went over the benefit seen with glycemic control.  Today she had juice and banana with her pills, her in-office BG 189 mg/dL, We discussed importance of low carb diet, she was advised to avoid sugar-sweetened beverages  Will refer to our CDE I am not going to increase Metformin as she has been having diarrhea since cholecystectomy , in the meantime she will work on low carb diet     MEDICATIONS: Continue Farxiga 10 mg daily  Continue Metformin 500 mg BID      EDUCATION / INSTRUCTIONS: BG monitoring instructions: Patient is instructed to check her blood sugars 1 times a day, fasting . Call [facility] if: BG persistently < 70 I reviewed the Rule of 15 for the treatment of hypoglycemia in detail with the patient. Literature supplied.   2) Diabetic complications:  Eye:  Does not have known diabetic retinopathy.  Neuro/ Feet: Does not have known diabetic peripheral neuropathy. Renal: Patient does not have known baseline CKD. She is on an ACEI/ARB at present.  3) Dyslipidemia:     - Tg as high as 563 mg/dL in 05/2019, this has been trending down . LDL at goal  - Will monitor     Medication  Continue Atorvastatin 10 mg daily   4) Proptosis :  - She is under the care of Ophthalmology  - CT orbit 09/2020 showed mild proptosis due to proliferation of orbital fat  - Son with Graves' disease, will check TFT's, TSI and TRAb   - TFT's are normal   F/U in 3 months    Signed electronically by: Mack Guise, MD  [facility]  [address] [place], [place] [address] Phone: [phone] FAX: [phone]   CC: Mosie Lukes, [address] [place] [address] Phone: [phone]  Fax: [phone]    Return to Endocrinology clinic as below: Future Appointments  Date Time Provider [place]  03/14/2021  8:30 AM Kahlee Metivier, Melanie Crazier, MD [facility]  03/15/2021  4:00 PM Armbruster, Carlota Raspberry, MD [facility]  03/29/2021  3:00 PM [facility]ECHO 1 [facility]ECHO [facility]  04/03/2021  3:40 PM [facility]-MM  1 [facility]  04/12/2021  4:00 PM Armbruster, Carlota Raspberry, MD [facility]  06/08/2021  3:20 PM Mosie Lukes, MD [facility]

## 2021-03-14 ENCOUNTER — Other Ambulatory Visit: Payer: Self-pay

## 2021-03-14 ENCOUNTER — Other Ambulatory Visit (HOSPITAL_COMMUNITY): Payer: Self-pay

## 2021-03-14 ENCOUNTER — Ambulatory Visit (INDEPENDENT_AMBULATORY_CARE_PROVIDER_SITE_OTHER): Payer: 59 | Admitting: Internal Medicine

## 2021-03-14 ENCOUNTER — Encounter: Payer: Self-pay | Admitting: Internal Medicine

## 2021-03-14 VITALS — BP 140/82 | HR 88 | Ht 61.75 in | Wt 187.0 lb

## 2021-03-14 DIAGNOSIS — E781 Pure hyperglyceridemia: Secondary | ICD-10-CM

## 2021-03-14 DIAGNOSIS — E119 Type 2 diabetes mellitus without complications: Secondary | ICD-10-CM

## 2021-03-14 DIAGNOSIS — H0520 Unspecified exophthalmos: Secondary | ICD-10-CM | POA: Insufficient documentation

## 2021-03-14 DIAGNOSIS — E1165 Type 2 diabetes mellitus with hyperglycemia: Secondary | ICD-10-CM

## 2021-03-14 LAB — GLUCOSE, POCT (MANUAL RESULT ENTRY): POC Glucose: 186 mg/dl — AB (ref 70–99)

## 2021-03-14 LAB — T4, FREE: Free T4: 0.77 ng/dL (ref 0.60–1.60)

## 2021-03-14 LAB — TSH: TSH: 1.74 u[IU]/mL (ref 0.35–5.50)

## 2021-03-14 MED ORDER — FREESTYLE LANCETS MISC
3 refills | Status: AC
  Filled 2021-03-14: qty 100, 90d supply, fill #0

## 2021-03-14 MED ORDER — FREESTYLE LITE W/DEVICE KIT
PACK | 0 refills | Status: AC
  Filled 2021-03-14: qty 1, 30d supply, fill #0

## 2021-03-14 MED ORDER — ONETOUCH VERIO VI STRP
1.0000 | ORAL_STRIP | Freq: Every day | 3 refills | Status: DC
  Filled 2021-03-14: qty 100, 100d supply, fill #0

## 2021-03-14 MED ORDER — GLUCOSE BLOOD VI STRP
ORAL_STRIP | 3 refills | Status: AC
  Filled 2021-03-14: qty 100, 90d supply, fill #0

## 2021-03-14 NOTE — Patient Instructions (Addendum)
-   Continue Farxiga 10 mg 1 tablet daily  - Continue Metformin 500 mg 1 tablet twice a day     HOW TO TREAT LOW BLOOD SUGARS (Blood sugar LESS THAN 70 MG/DL) Please follow the RULE OF 15 for the treatment of hypoglycemia treatment (when your (blood sugars are less than 70 mg/dL)   STEP 1: Take 15 grams of carbohydrates when your blood sugar is low, which includes:  3-4 GLUCOSE TABS  OR 3-4 OZ OF JUICE OR REGULAR SODA OR ONE TUBE OF GLUCOSE GEL    STEP 2: RECHECK blood sugar in 15 MINUTES STEP 3: If your blood sugar is still low at the 15 minute recheck --> then, go back to STEP 1 and treat AGAIN with another 15 grams of carbohydrates.

## 2021-03-15 ENCOUNTER — Other Ambulatory Visit (HOSPITAL_BASED_OUTPATIENT_CLINIC_OR_DEPARTMENT_OTHER): Payer: 59

## 2021-03-15 ENCOUNTER — Ambulatory Visit: Payer: 59 | Admitting: Gastroenterology

## 2021-03-15 ENCOUNTER — Encounter: Payer: Self-pay | Admitting: Gastroenterology

## 2021-03-15 VITALS — BP 106/64 | HR 72 | Wt 187.2 lb

## 2021-03-15 DIAGNOSIS — K641 Second degree hemorrhoids: Secondary | ICD-10-CM | POA: Diagnosis not present

## 2021-03-15 NOTE — Patient Instructions (Signed)
If you are age 56 or older, your body mass index should be between 23-30. Your Body mass index is 34.52 kg/m. If this is out of the aforementioned range listed, please consider follow up with your Primary Care Provider.  If you are age 28 or younger, your body mass index should be between 19-25. Your Body mass index is 34.52 kg/m. If this is out of the aformentioned range listed, please consider follow up with your Primary Care Provider.   __________________________________________________________  The [facility] providers would like to encourage you to use [facility] to communicate with providers for non-urgent requests or questions.  Due to long hold times on the telephone, sending your provider a message by [facility] may be a faster and more efficient way to get a response.  Please allow 48 business hours for a response.  Please remember that this is for non-urgent requests.   HEMORRHOID BANDING PROCEDURE    FOLLOW-UP CARE   The procedure you have had should have been relatively painless since the banding of the area involved does not have nerve endings and there is no pain sensation.  The rubber band cuts off the blood supply to the hemorrhoid and the band may fall off as soon as 48 hours after the banding (the band may occasionally be seen in the toilet bowl following a bowel movement). You may notice a temporary feeling of fullness in the rectum which should respond adequately to plain Tylenol or Motrin.  Following the banding, avoid strenuous exercise that evening and resume full activity the next day.  A sitz bath (soaking in a warm tub) or bidet is soothing, and can be useful for cleansing the area after bowel movements.     To avoid constipation, take two tablespoons of natural wheat bran, natural oat bran, flax, Benefiber or any over the counter fiber supplement and increase your water intake to 7-8 glasses daily.    Unless you have been prescribed anorectal medication, do not put  anything inside your rectum for two weeks: No suppositories, enemas, fingers, etc.  Occasionally, you may have more bleeding than usual after the banding procedure.  This is often from the untreated hemorrhoids rather than the treated one.  Don't be concerned if there is a tablespoon or so of blood.  If there is more blood than this, lie flat with your bottom higher than your head and apply an ice pack to the area. If the bleeding does not stop within a half an hour or if you feel faint, call our office at [phone] or go to the emergency room.  Problems are not common; however, if there is a substantial amount of bleeding, severe pain, chills, fever or difficulty passing urine (very rare) or other problems, you should call us at [phone] or report to the nearest emergency room.  Do not stay seated continuously for more than 2-3 hours for a day or two after the procedure.  Tighten your buttock muscles 10-15 times every two hours and take 10-15 deep breaths every 1-2 hours.  Do not spend more than a few minutes on the toilet if you cannot empty your bowel; instead re-visit the toilet at a later time.   You have your 3rd and final banding appointment on Wednesday, 04-12-21 at 4:00 pm.  Please let us know as soon possible if you need to reschedule:  [phone]  Thank you for entrusting me with your care and for choosing [facility], Dr.  Cellar

## 2021-03-15 NOTE — Progress Notes (Signed)
55 year old female here for follow-up visit for hemorrhoid banding.  She had complained of symptomatic internal hemorrhoids at the last visit in the setting of some constipation.  We recommended MiraLAX daily to treat the constipation which she states has helped.  We banded the RP area at the last visit and she states she tolerated it well without any significant pain or bleeding.  She has noticed some improvement in her hemorrhoid symptoms but not yet resolved.  She wishes to proceed with additional banding today.   PROCEDURE NOTE: The patient presents with symptomatic grade II  hemorrhoids, requesting rubber band ligation of his/her hemorrhoidal disease.  All risks, benefits and alternative forms of therapy were described and informed consent was obtained.   The anorectum was pre-medicated with 0.125% nitroglycerin The decision was made to band the LL internal hemorrhoid, and the [place] was used to perform band ligation without complication.  Digital anorectal examination was then performed to assure proper positioning of the band, and to adjust the banded tissue as required.  The patient was discharged home without pain or other issues.  Dietary and behavioral recommendations were given and along with follow-up instructions.     The following adjunctive treatments were recommended: Miralax daily   The patient will return in 2-4 weeks for  follow-up and possible additional banding as required. No complications were encountered and the patient tolerated the procedure well.     [name], MD [facility]

## 2021-03-17 ENCOUNTER — Other Ambulatory Visit: Payer: Self-pay | Admitting: Family Medicine

## 2021-03-17 ENCOUNTER — Other Ambulatory Visit (HOSPITAL_COMMUNITY): Payer: Self-pay

## 2021-03-17 LAB — THYROID STIMULATING IMMUNOGLOBULIN

## 2021-03-17 LAB — TRAB (TSH RECEPTOR BINDING ANTIBODY): TRAB: <1 IU/L (ref <=2.00)

## 2021-03-20 ENCOUNTER — Telehealth: Payer: Self-pay | Admitting: *Deleted

## 2021-03-20 ENCOUNTER — Other Ambulatory Visit: Payer: Self-pay | Admitting: Family Medicine

## 2021-03-20 ENCOUNTER — Other Ambulatory Visit (HOSPITAL_COMMUNITY): Payer: Self-pay

## 2021-03-20 DIAGNOSIS — H0520 Unspecified exophthalmos: Secondary | ICD-10-CM

## 2021-03-20 MED FILL — Losartan Potassium Tab 100 MG: ORAL | 90 days supply | Qty: 90 | Fill #0 | Status: AC

## 2021-03-20 NOTE — Telephone Encounter (Signed)
Received notification form [facility] that the thyroid stimulating immunoglobuin was not able to be completed as specimen was received at room temperature.  I have notified pt and she will return to [facility] lab to redraw. Future order placed.

## 2021-03-23 ENCOUNTER — Other Ambulatory Visit (HOSPITAL_COMMUNITY): Payer: Self-pay

## 2021-03-27 ENCOUNTER — Telehealth (HOSPITAL_BASED_OUTPATIENT_CLINIC_OR_DEPARTMENT_OTHER): Payer: Self-pay

## 2021-03-29 ENCOUNTER — Other Ambulatory Visit: Payer: Self-pay

## 2021-03-29 ENCOUNTER — Ambulatory Visit (HOSPITAL_BASED_OUTPATIENT_CLINIC_OR_DEPARTMENT_OTHER)
Admission: RE | Admit: 2021-03-29 | Discharge: 2021-03-29 | Disposition: A | Discharge status: Home / Self Care | Payer: 59 | Source: Ambulatory Visit | Attending: Family Medicine | Admitting: Family Medicine

## 2021-03-29 DIAGNOSIS — R0600 Dyspnea, unspecified: Secondary | ICD-10-CM | POA: Diagnosis not present

## 2021-03-29 LAB — ECHOCARDIOGRAM COMPLETE
AR max vel: 2.26 cm2
AV Area VTI: 2.5 cm2
AV Area mean vel: 2.45 cm2
AV Mean grad: 4 mmHg
AV Peak grad: 8.1 mmHg
Ao pk vel: 1.42 m/s
Area-P 1/2: 3.13 cm2
Calc EF: 74.5 %
S' Lateral: 1.9 cm
Single Plane A2C EF: 78 %
Single Plane A4C EF: 73.2 %

## 2021-03-30 ENCOUNTER — Other Ambulatory Visit: Payer: 59

## 2021-03-30 DIAGNOSIS — H0520 Unspecified exophthalmos: Secondary | ICD-10-CM | POA: Diagnosis not present

## 2021-03-31 ENCOUNTER — Telehealth (HOSPITAL_BASED_OUTPATIENT_CLINIC_OR_DEPARTMENT_OTHER): Payer: Self-pay

## 2021-04-03 ENCOUNTER — Ambulatory Visit (HOSPITAL_BASED_OUTPATIENT_CLINIC_OR_DEPARTMENT_OTHER): Payer: 59

## 2021-04-04 ENCOUNTER — Other Ambulatory Visit (HOSPITAL_COMMUNITY): Payer: Self-pay

## 2021-04-04 LAB — THYROID STIMULATING IMMUNOGLOBULIN: TSI: <89 % baseline (ref <140)

## 2021-04-12 ENCOUNTER — Ambulatory Visit: Payer: 59 | Admitting: Gastroenterology

## 2021-04-12 ENCOUNTER — Encounter: Payer: Self-pay | Admitting: Gastroenterology

## 2021-04-12 VITALS — BP 120/70 | HR 68 | Ht 61.75 in | Wt 190.4 lb

## 2021-04-12 DIAGNOSIS — K641 Second degree hemorrhoids: Secondary | ICD-10-CM

## 2021-04-12 NOTE — Progress Notes (Signed)
55 year old female here for follow-up visit for hemorrhoid banding.  She had complained of symptomatic internal hemorrhoids at the last visit in the setting of some constipation.  We recommended MiraLAX daily to treat the constipation which she states has helped.  We banded the RP area 02/24/2021, and LL on 03/15/21. She has had some benefit to her symptoms - irritation / superficial bleeding - but had some recurrent more recently. She had some discomfort for 2 days post LL banding which resolved. She wishes to proceed with final banding today of the RA area.     PROCEDURE NOTE: The patient presents with symptomatic grade II  hemorrhoids, requesting rubber band ligation of his/her hemorrhoidal disease.  All risks, benefits and alternative forms of therapy were described and informed consent was obtained.   The anorectum was pre-medicated with 0.125% nitroglycerin The decision was made to band the RA internal hemorrhoid, and the Chunky was used to perform band ligation without complication.  Digital anorectal examination was then performed to assure proper positioning of the band, and to adjust the banded tissue as required.  The patient was discharged home without pain or other issues.  Dietary and behavioral recommendations were given and along with follow-up instructions.     The following adjunctive treatments were recommended: Miralax daily   The patient will return as needed for  follow-up, no additional scheduled banding. No complications were encountered and the patient tolerated the procedure well.  [name], MD [facility]

## 2021-04-12 NOTE — Patient Instructions (Signed)
If you are age 56 or older, your body mass index should be between 23-30. Your Body mass index is 35.11 kg/m. If this is out of the aforementioned range listed, please consider follow up with your Primary Care Provider.  If you are age 47 or younger, your body mass index should be between 19-25. Your Body mass index is 35.11 kg/m. If this is out of the aformentioned range listed, please consider follow up with your Primary Care Provider.   ________________________________________________________  The [facility] providers would like to encourage you to use [facility] to communicate with providers for non-urgent requests or questions.  Due to long hold times on the telephone, sending your provider a message by [facility] may be a faster and more efficient way to get a response.  Please allow 48 business hours for a response.  Please remember that this is for non-urgent requests.  _______________________________________________________  [facility]ayer Jew PROCEDURE    FOLLOW-UP CARE   The procedure you have had should have been relatively painless since the banding of the area involved does not have nerve endings and there is no pain sensation.  The rubber band cuts off the blood supply to the hemorrhoid and the band may fall off as soon as 48 hours after the banding (the band may occasionally be seen in the toilet bowl following a bowel movement). You may notice a temporary feeling of fullness in the rectum which should respond adequately to plain Tylenol or Motrin.  Following the banding, avoid strenuous exercise that evening and resume full activity the next day.  A sitz bath (soaking in a warm tub) or bidet is soothing, and can be useful for cleansing the area after bowel movements.     To avoid constipation, take two tablespoons of natural wheat bran, natural oat bran, flax, Benefiber or any over the counter fiber supplement and increase your water intake to 7-8 glasses daily.    Unless you  have been prescribed anorectal medication, do not put anything inside your rectum for two weeks: No suppositories, enemas, fingers, etc.  Occasionally, you may have more bleeding than usual after the banding procedure.  This is often from the untreated hemorrhoids rather than the treated one.  Don't be concerned if there is a tablespoon or so of blood.  If there is more blood than this, lie flat with your bottom higher than your head and apply an ice pack to the area. If the bleeding does not stop within a half an hour or if you feel faint, call our office at [phone] or go to the emergency room.  Problems are not common; however, if there is a substantial amount of bleeding, severe pain, chills, fever or difficulty passing urine (very rare) or other problems, you should call us at [phone] or report to the nearest emergency room.  Do not stay seated continuously for more than 2-3 hours for a day or two after the procedure.  Tighten your buttock muscles 10-15 times every two hours and take 10-15 deep breaths every 1-2 hours.  Do not spend more than a few minutes on the toilet if you cannot empty your bowel; instead re-visit the toilet at a later time.   Thank you for entrusting me with your care and for choosing [facility], Dr. Carey Cellar

## 2021-04-17 ENCOUNTER — Telehealth: Payer: Self-pay

## 2021-04-17 NOTE — Telephone Encounter (Signed)
Pt called in stating they needed a something to tell her employer that she got the flu vaccine. I have sent her a my chart message.

## 2021-05-04 ENCOUNTER — Other Ambulatory Visit: Payer: Self-pay | Admitting: Family Medicine

## 2021-05-04 ENCOUNTER — Other Ambulatory Visit (HOSPITAL_COMMUNITY): Payer: Self-pay

## 2021-05-04 MED ORDER — TOPIRAMATE 50 MG PO TABS
50.0000 mg | ORAL_TABLET | Freq: Every day | ORAL | 0 refills | Status: DC
  Filled 2021-05-04: qty 90, 90d supply, fill #0

## 2021-05-04 MED ORDER — GABAPENTIN 300 MG PO CAPS
ORAL_CAPSULE | ORAL | 0 refills | Status: DC
  Filled 2021-05-04: qty 90, 90d supply, fill #0

## 2021-05-08 ENCOUNTER — Other Ambulatory Visit (HOSPITAL_COMMUNITY): Payer: Self-pay

## 2021-05-15 ENCOUNTER — Other Ambulatory Visit: Payer: Self-pay

## 2021-05-15 ENCOUNTER — Ambulatory Visit (HOSPITAL_BASED_OUTPATIENT_CLINIC_OR_DEPARTMENT_OTHER)
Admission: RE | Admit: 2021-05-15 | Discharge: 2021-05-15 | Disposition: A | Discharge status: Home / Self Care | Payer: 59 | Source: Ambulatory Visit | Attending: Family Medicine | Admitting: Family Medicine

## 2021-05-15 DIAGNOSIS — Z1231 Encounter for screening mammogram for malignant neoplasm of breast: Secondary | ICD-10-CM | POA: Diagnosis not present

## 2021-05-18 ENCOUNTER — Other Ambulatory Visit: Payer: Self-pay

## 2021-05-18 ENCOUNTER — Encounter: Payer: Self-pay | Admitting: Dietician

## 2021-05-18 ENCOUNTER — Encounter: Payer: 59 | Attending: Internal Medicine | Admitting: Dietician

## 2021-05-18 DIAGNOSIS — E119 Type 2 diabetes mellitus without complications: Secondary | ICD-10-CM | POA: Insufficient documentation

## 2021-05-18 NOTE — Progress Notes (Addendum)
Diabetes Self-Management Education  Visit Type: First/Initial  Appt. Start Time: 1535 Appt. End Time: 3419  05/18/2021  [name], identified by name and date of birth, is a 55 y.o. female with a diagnosis of Diabetes: Type 2.   ASSESSMENT Patient is here today alone.  She states that she needs to change her eating and lifestyle as well as lose weight.  She would also like to know what to eat to avoid GI symptoms since her gallbladder was removed.  She states that she has gained weight since surgery 02/2021. Used to weight herself daily. Diet includes some high fat take out, chicken skin.  History includes Type 2 diabetes, nausea after gallbladder removed 02/2021, HTN, HLD, OSA (dx years ago and no longer uses c-pap), gastroparesis Poor sleep (3 hours per night), wakes frequently to use the restroom Back pain A1C  7.1% 01/23/2021, cholesterol 106, HDL 45, Triglycerides 234 10/2020.   Medications include:  Farxiga, Metformin, Vitamin B-12  Weight hx: 192 lbs 05/18/2021 187 lbs 03/15/2021 171 lbs 01/11/2020 155 lbs lowest adult weight 2018 195 lbs highest adult weight  10/17/2017  Patient lives with her husband.  They share shopping and cooking.  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  She is a Geophysicist/field seismologist (babies and dogs). Allergic to shellfish Lactose intolerant Spinach and all greens causes diarrhea.  Lettuce does not cause symptoms. Doesn't tolerate raw onions Her husband is very supportive but enjoys feeding her and likes to snack at night. Increased stress this year.  Height 5' 2.75" (1.594 m), weight 192 lb (87.1 kg). Body mass index is 34.28 kg/m.   Diabetes Self-Management Education - 05/18/21 1556       Visit Information   Visit Type First/Initial      Initial Visit   Diabetes Type Type 2    Are you currently following a meal plan? No    Are you taking your medications as prescribed? Yes    Date Diagnosed 2018       Health Coping   How would you rate your overall health? Good      Psychosocial Assessment   Patient Belief/Attitude about Diabetes Motivated to manage diabetes    Self-care barriers None    Self-management support Doctor's office;Family    Other persons present Patient    Patient Concerns Nutrition/Meal planning;Weight Control;Glycemic Control    Special Needs None    Preferred Learning Style No preference indicated    Learning Readiness Ready    How often do you need to have someone help you when you read instructions, pamphlets, or other written materials from your doctor or pharmacy? 1 - Never    What is the last grade level you completed in school? 1 year college      Pre-Education Assessment   Patient understands the diabetes disease and treatment process. Needs Review    Patient understands incorporating nutritional management into lifestyle. Needs Review    Patient undertands incorporating physical activity into lifestyle. Needs Review    Patient understands using medications safely. Needs Review    Patient understands monitoring blood glucose, interpreting and using results Needs Review    Patient understands prevention, detection, and treatment of acute complications. Needs Review    Patient understands prevention, detection, and treatment of chronic complications. Needs Review    Patient understands how to develop strategies to address psychosocial issues. Needs Review    Patient understands how to develop strategies to promote health/change behavior.  Needs Review      Complications   Last HgB A1C per patient/outside source 7.1 %   01/23/2021   How often do you check your blood sugar? --   once this week   Fasting Blood glucose range (mg/dL) 130-179    Number of hypoglycemic episodes per month 0    Have you had a dilated eye exam in the past 12 months? Yes    Have you had a dental exam in the past 12 months? Yes    Are you checking your feet? Yes    How many days per week  are you checking your feet? 4      Dietary Intake   Breakfast banana only OR Omelet on the weekend    Snack (morning) oatmeal (cafeteria) with cream and butter OR grits, eggs, sausage    Lunch (cafeteria) - [place] burger without bun and lettuce and tomato, occasional sweet potato pie or banana pudding OR chips    Snack (afternoon) none    Dinner [place] Buffet take out such chicken wings or beef with or rice or lomein noodles OR air fried chicken or steak, vegetables, sweet potatoes    Snack (evening) chips    Beverage(s) water, tea with agave      Exercise   Exercise Type Light (walking / raking leaves)   only walking at work     Patient Education   Previous Diabetes Education Yes (please comment)   2018   Disease state  Definition of diabetes, type 1 and 2, and the diagnosis of diabetes    Nutrition management  Role of diet in the treatment of diabetes and the relationship between the three main macronutrients and blood glucose level;Food label reading, portion sizes and measuring food.;Meal options for control of blood glucose level and chronic complications.    Physical activity and exercise  Role of exercise on diabetes management, blood pressure control and cardiac health.;Helped patient identify appropriate exercises in relation to his/her diabetes, diabetes complications and other health issue.    Medications Reviewed patients medication for diabetes, action, purpose, timing of dose and side effects.    Monitoring Identified appropriate SMBG and/or A1C goals.;Daily foot exams    Chronic complications Relationship between chronic complications and blood glucose control    Psychosocial adjustment Identified and addressed patients feelings and concerns about diabetes      Individualized Goals (developed by patient)   Nutrition General guidelines for healthy choices and portions discussed    Physical Activity Exercise 3-5 times per week;15 minutes per day    Medications take my  medication as prescribed    Monitoring  test my blood glucose as discussed    Reducing Risk examine blood glucose patterns      Post-Education Assessment   Patient understands the diabetes disease and treatment process. Demonstrates understanding / competency    Patient understands incorporating nutritional management into lifestyle. Needs Review    Patient undertands incorporating physical activity into lifestyle. Demonstrates understanding / competency    Patient understands using medications safely. Demonstrates understanding / competency    Patient understands monitoring blood glucose, interpreting and using results Demonstrates understanding / competency    Patient understands prevention, detection, and treatment of acute complications. Demonstrates understanding / competency    Patient understands prevention, detection, and treatment of chronic complications. Demonstrates understanding / competency    Patient understands how to develop strategies to address psychosocial issues. Demonstrates understanding / competency    Patient understands how to develop strategies to  promote health/change behavior. Needs Review      Outcomes   Expected Outcomes Demonstrated interest in learning. Expect positive outcomes    Future DMSE 2 months    Program Status Not Completed             Individualized Plan for Diabetes Self-Management Training:   Learning Objective:  Patient will have a greater understanding of diabetes self-management. Patient education plan is to attend individual and/or group sessions per assessed needs and concerns.   Plan:   Patient Instructions  Choose Tea with stevia   Consider going to the gym at work after you eat your lunch.  Start with 10 minutes and increase as tolerated.  Break the late night snack habit.  Consider hot tea instead.  If you are hungry.  Choose a lean protein and small portion of carbohydrate as desired.  Choose low fat (lean meats), small  amounts of oil, butter and other fats, take the skin off of the chicken.   Listen to your body.  What foods are you tolerating?  Expected Outcomes:  Demonstrated interest in learning. Expect positive outcomes  Education material provided: ADA - How to Thrive: A Guide for Your Journey with Diabetes, Food label handouts, Meal plan card, Snack sheet, and Diabetes Resources; gastroparesis nutrition therapy  If problems or questions, patient to contact team via:  Phone  Future DSME appointment: 2 months

## 2021-05-18 NOTE — Patient Instructions (Signed)
Choose Tea with stevia   Consider going to the gym at work after you eat your lunch.  Start with 10 minutes and increase as tolerated.  Break the late night snack habit.  Consider hot tea instead.  If you are hungry.  Choose a lean protein and small portion of carbohydrate as desired.  Choose low fat (lean meats), small amounts of oil, butter and other fats, take the skin off of the chicken.   Listen to your body.  What foods are you tolerating?

## 2021-05-19 ENCOUNTER — Encounter: Payer: Self-pay | Admitting: Gastroenterology

## 2021-05-31 ENCOUNTER — Other Ambulatory Visit (HOSPITAL_COMMUNITY): Payer: Self-pay

## 2021-05-31 ENCOUNTER — Ambulatory Visit (INDEPENDENT_AMBULATORY_CARE_PROVIDER_SITE_OTHER): Payer: 59 | Admitting: Pulmonary Disease

## 2021-05-31 ENCOUNTER — Encounter: Payer: Self-pay | Admitting: Pulmonary Disease

## 2021-05-31 ENCOUNTER — Other Ambulatory Visit: Payer: Self-pay

## 2021-05-31 VITALS — BP 124/68 | HR 63 | Temp 97.7°F | Ht 61.0 in | Wt 189.4 lb

## 2021-05-31 DIAGNOSIS — R0609 Other forms of dyspnea: Secondary | ICD-10-CM

## 2021-05-31 MED ORDER — FLUTICASONE-SALMETEROL 500-50 MCG/ACT IN AEPB
1.0000 | INHALATION_SPRAY | Freq: Two times a day (BID) | RESPIRATORY_TRACT | 3 refills | Status: AC
  Filled 2021-05-31: qty 60, 30d supply, fill #0
  Filled 2021-06-01: qty 180, 90d supply, fill #1
  Filled 2021-06-01: qty 60, 30d supply, fill #1
  Filled 2021-06-01: qty 180, 90d supply, fill #1
  Filled 2021-06-01: qty 180, 90d supply, fill #0
  Filled 2022-02-13: qty 60, 30d supply, fill #1

## 2021-05-31 NOTE — Patient Instructions (Signed)
Nice to meet you  Your story sounds most consistent to me like the development of asthma  Use Advair 1 inhalation twice a day.  Rinse your mouth out after every use.  If the co-pay is too high, please call me.  Continue use albuterol as needed for shortness of breath  We will get pulmonary function test in the coming days to weeks to help further evaluate your symptoms.  This will give me the best information to know how to help you moving forward.  Return to clinic in 3 months or sooner as needed with Dr. Silas Flood

## 2021-06-01 ENCOUNTER — Other Ambulatory Visit (HOSPITAL_COMMUNITY): Payer: Self-pay

## 2021-06-01 ENCOUNTER — Other Ambulatory Visit: Payer: Self-pay | Admitting: Family Medicine

## 2021-06-01 ENCOUNTER — Telehealth: Payer: Self-pay | Admitting: Pulmonary Disease

## 2021-06-01 MED ORDER — POTASSIUM CHLORIDE CRYS ER 20 MEQ PO TBCR
EXTENDED_RELEASE_TABLET | Freq: Every day | ORAL | 5 refills | Status: DC
  Filled 2021-06-01: qty 90, 90d supply, fill #0

## 2021-06-01 NOTE — Telephone Encounter (Signed)
Called and spoke with pt to see if she had picked up the 57-month supply of the Advair and she said that she was currently at the pharmacy now. Stated to her if the pharmacy needed for [name] to send in a new Rx for the 90-day supply that we could but per pt, the pharmacy had the Rx but said the Rx was stuck in the computer and did not need to have a new Rx sent. Nothing further needed.

## 2021-06-02 ENCOUNTER — Other Ambulatory Visit (HOSPITAL_COMMUNITY): Payer: Self-pay

## 2021-06-08 ENCOUNTER — Ambulatory Visit: Payer: 59 | Admitting: Family Medicine

## 2021-06-08 ENCOUNTER — Other Ambulatory Visit (HOSPITAL_COMMUNITY): Payer: Self-pay

## 2021-06-08 ENCOUNTER — Encounter: Payer: Self-pay | Admitting: Family Medicine

## 2021-06-08 VITALS — BP 116/68 | HR 80 | Temp 98.9°F | Resp 16 | Ht 61.0 in | Wt 192.4 lb

## 2021-06-08 DIAGNOSIS — Z23 Encounter for immunization: Secondary | ICD-10-CM

## 2021-06-08 DIAGNOSIS — E669 Obesity, unspecified: Secondary | ICD-10-CM

## 2021-06-08 DIAGNOSIS — R109 Unspecified abdominal pain: Secondary | ICD-10-CM | POA: Diagnosis not present

## 2021-06-08 DIAGNOSIS — R197 Diarrhea, unspecified: Secondary | ICD-10-CM

## 2021-06-08 DIAGNOSIS — E1165 Type 2 diabetes mellitus with hyperglycemia: Secondary | ICD-10-CM

## 2021-06-08 DIAGNOSIS — E785 Hyperlipidemia, unspecified: Secondary | ICD-10-CM | POA: Diagnosis not present

## 2021-06-08 DIAGNOSIS — E119 Type 2 diabetes mellitus without complications: Secondary | ICD-10-CM | POA: Diagnosis not present

## 2021-06-08 DIAGNOSIS — T7840XD Allergy, unspecified, subsequent encounter: Secondary | ICD-10-CM | POA: Diagnosis not present

## 2021-06-08 MED ORDER — FLUTICASONE PROPIONATE 50 MCG/ACT NA SUSP
2.0000 | Freq: Every day | NASAL | 1 refills | Status: AC | PRN
Start: 2021-06-08 — End: ?
  Filled 2021-06-08: qty 48, 90d supply, fill #0
  Filled 2022-04-02: qty 48, 90d supply, fill #1

## 2021-06-08 MED ORDER — LOSARTAN POTASSIUM 100 MG PO TABS
ORAL_TABLET | ORAL | 1 refills | Status: DC
Start: 2021-06-08 — End: 2022-02-13
  Filled 2021-06-08: qty 90, 90d supply, fill #0
  Filled 2021-11-01: qty 90, 90d supply, fill #1

## 2021-06-08 MED ORDER — ATORVASTATIN CALCIUM 10 MG PO TABS
10.0000 mg | ORAL_TABLET | Freq: Every day | ORAL | 1 refills | Status: DC
  Filled 2021-06-08 – 2021-08-31 (×2): qty 90, 90d supply, fill #0

## 2021-06-08 MED ORDER — TOPIRAMATE 50 MG PO TABS
50.0000 mg | ORAL_TABLET | Freq: Every day | ORAL | 1 refills | Status: DC
  Filled 2021-06-08 – 2021-09-18 (×2): qty 90, 90d supply, fill #0
  Filled 2022-01-16: qty 90, 90d supply, fill #1

## 2021-06-08 MED ORDER — METFORMIN HCL 500 MG PO TABS
500.0000 mg | ORAL_TABLET | Freq: Two times a day (BID) | ORAL | 1 refills | Status: DC
  Filled 2021-06-08: qty 180, 90d supply, fill #0

## 2021-06-08 MED ORDER — DAPAGLIFLOZIN PROPANEDIOL 10 MG PO TABS
10.0000 mg | ORAL_TABLET | Freq: Every day | ORAL | 1 refills | Status: DC
  Filled 2021-06-08: qty 90, 90d supply, fill #0

## 2021-06-08 MED ORDER — HYDROCHLOROTHIAZIDE 25 MG PO TABS
25.0000 mg | ORAL_TABLET | Freq: Every day | ORAL | 1 refills | Status: DC
  Filled 2021-06-08: qty 90, 90d supply, fill #0
  Filled 2021-11-01: qty 90, 90d supply, fill #1

## 2021-06-08 MED ORDER — ALBUTEROL SULFATE HFA 108 (90 BASE) MCG/ACT IN AERS
2.0000 | INHALATION_SPRAY | Freq: Four times a day (QID) | RESPIRATORY_TRACT | 1 refills | Status: DC | PRN
  Filled 2021-06-08: qty 54, 75d supply, fill #0
  Filled 2022-04-02: qty 20.1, 75d supply, fill #1

## 2021-06-08 MED ORDER — MOMETASONE FUROATE 0.1 % EX CREA
1.0000 "application " | TOPICAL_CREAM | Freq: Every day | CUTANEOUS | 1 refills | Status: DC
  Filled 2021-06-08: qty 45, 30d supply, fill #0
  Filled 2021-09-18: qty 45, 30d supply, fill #1
  Filled 2022-01-16: qty 45, 30d supply, fill #2
  Filled 2022-02-22: qty 135, 90d supply, fill #3

## 2021-06-08 MED ORDER — GABAPENTIN 300 MG PO CAPS
300.0000 mg | ORAL_CAPSULE | Freq: Every day | ORAL | 1 refills | Status: DC
  Filled 2021-06-08 – 2021-09-07 (×2): qty 90, 90d supply, fill #0
  Filled 2022-03-12: qty 90, 90d supply, fill #1

## 2021-06-08 MED ORDER — CARVEDILOL 25 MG PO TABS
ORAL_TABLET | Freq: Two times a day (BID) | ORAL | 1 refills | Status: DC
  Filled 2021-06-08 – 2021-11-17 (×2): qty 180, 90d supply, fill #0
  Filled 2022-02-13: qty 180, 90d supply, fill #1

## 2021-06-08 MED ORDER — HYOSCYAMINE SULFATE 0.125 MG PO TBDP
0.1250 mg | ORAL_TABLET | Freq: Four times a day (QID) | ORAL | 2 refills | Status: DC | PRN
Start: 2021-06-08 — End: 2021-08-31
  Filled 2021-06-08: qty 60, 15d supply, fill #0

## 2021-06-08 MED ORDER — POTASSIUM CHLORIDE CRYS ER 20 MEQ PO TBCR
20.0000 meq | EXTENDED_RELEASE_TABLET | Freq: Every day | ORAL | 1 refills | Status: DC
  Filled 2021-06-08 – 2021-09-18 (×2): qty 90, 90d supply, fill #0

## 2021-06-08 MED ORDER — AMLODIPINE BESYLATE 5 MG PO TABS
5.0000 mg | ORAL_TABLET | Freq: Every day | ORAL | 1 refills | Status: DC
  Filled 2021-06-08 – 2021-11-01 (×2): qty 90, 90d supply, fill #0
  Filled 2022-02-13: qty 90, 90d supply, fill #1

## 2021-06-08 MED ORDER — CETIRIZINE HCL 10 MG PO TABS
10.0000 mg | ORAL_TABLET | Freq: Every day | ORAL | 1 refills | Status: DC
  Filled 2021-06-08: qty 90, 90d supply, fill #0

## 2021-06-08 MED ORDER — TRETINOIN 0.025 % EX CREA
TOPICAL_CREAM | Freq: Every day | CUTANEOUS | 1 refills | Status: DC
  Filled 2021-06-08: qty 45, 30d supply, fill #0
  Filled 2021-07-04: qty 135, 90d supply, fill #0
  Filled 2021-09-18: qty 135, 90d supply, fill #1

## 2021-06-08 NOTE — Assessment & Plan Note (Signed)
hgba1c acceptable, minimize simple carbs. Increase exercise as tolerated. Continue current meds. Given Prevnar 20

## 2021-06-08 NOTE — Assessment & Plan Note (Signed)
Improved but does improve with Hyoscyamine.

## 2021-06-08 NOTE — Assessment & Plan Note (Signed)
Encourage heart healthy diet such as MIND or DASH diet, increase exercise, avoid trans fats, simple carbohydrates and processed foods, consider a krill or fish or flaxseed oil cap daily. Tolerating Atorvastatin 

## 2021-06-08 NOTE — Progress Notes (Signed)
Patient ID: Monique Wiggins, female    DOB: 08-Mar-1966  Age: 55 y.o. MRN: [id number]    Subjective:   Chief Complaint  Patient presents with   3 months follow up   Subjective   HPI Monique Wiggins presents for office visit today for follow up on type 2 diabetes and hyperlipidemia. She now goes to a nutritionist and is doing well. She was put on new inhaler Advair 1 and is scheduled for follow up with pulmonary. Currently, she is tolerating it well. Denies CP/palp/SOB/HA/congestion/fevers or GU c/o. Taking meds as prescribed.  GI issues: 2 weeks ago was experiencing diarrhea, but now she is experiencing constipation. She has tried taking Miralax, but she stopped it once diarrhea started. Within the last two month she has been experiencing hemorrhoids that flare up from diarrhea.   Review of Systems  Constitutional:  Negative for chills, fatigue and fever.  HENT:  Negative for congestion, rhinorrhea, sinus pressure, sinus pain and sore throat.   Eyes:  Negative for pain.  Respiratory:  Negative for cough and shortness of breath.   Cardiovascular:  Negative for chest pain, palpitations and leg swelling.  Gastrointestinal:  Positive for constipation and diarrhea. Negative for abdominal pain, blood in stool, nausea and vomiting.  Genitourinary:  Negative for decreased urine volume, flank pain, frequency, vaginal bleeding and vaginal discharge.  Musculoskeletal:  Negative for back pain.  Neurological:  Negative for headaches.   History Past Medical History:  Diagnosis Date   Allergy    Arthritis    Back pain    Carpal tunnel syndrome    Diabetes mellitus without complication (HCC)    DVT (deep venous thrombosis) ([place])    after long car trip   Dyspnea    Gastroparesis    GERD (gastroesophageal reflux disease)    H. pylori infection    1997   Heart murmur    Hypertension    Hypertriglyceridemia 02/22/2017   Hypertriglyceridemia 02/22/2017   Migraines    Sickle  cell trait (HCC)    Sleep apnea    states she no longer has OSA lost weight    She has a past surgical history that includes tummy tuck; Tonsillectomy; Toe Surgery; Abdominal hysterectomy; Carpometacarpel [facility]) suspension plasty (Right, 01/19/2019); Carpal tunnel release (Right, 01/19/2019); and Cholecystectomy (01/24/2021).   Her family history includes Allergic Disorder in her son and son; Asthma in her son; Cancer in her maternal grandfather; Diabetes in her sister; Diabetes Mellitus II in her father; Diabetes type II in her mother; High Cholesterol in her father; High blood pressure in her mother; Hypertension in her father and sister; Hyperthyroidism in her son; Pulmonary disease in her father; Pulmonary fibrosis in her father; Stomach cancer in her maternal grandmother.She reports that she has never smoked. She has never used smokeless tobacco. She reports that she does not drink alcohol and does not use drugs.  Current Outpatient Medications on File Prior to Visit  Medication Sig Dispense Refill   aluminum-magnesium hydroxide-simethicone (MAALOX) 595-638-75 MG/5ML SUSP Take 30 mLs by mouth 3 (three) times daily with meals as needed. 355 mL 0   Ascorbic Acid (VITAMIN C PO) Take 1 capsule by mouth daily.     Blood Glucose Monitoring Suppl (FREESTYLE LITE) w/Device KIT Use as directed to test blood sugar once a day 1 kit 0   cholecalciferol (VITAMIN D3) 25 MCG (1000 UNIT) tablet Take 1,000 Units by mouth daily.     Cyanocobalamin (B-12 SL) Place 1 capsule under the tongue  daily.     fluticasone-salmeterol (ADVAIR DISKUS) 500-50 MCG/ACT AEPB Inhale 1 puff into the lungs in the morning and at bedtime. 60 each 3   glucose blood test strip Use as directed to test blood sugar daily in the afternoon 100 each 3   HYDROcodone-acetaminophen (NORCO/VICODIN) 5-325 MG tablet Take 1 tablet by mouth every 6 (six) hours as needed for moderate pain. 15 tablet 0   ibuprofen (ADVIL) 200 MG tablet Take 400 mg by  mouth every 6 (six) hours as needed for headache or moderate pain.     Lancets (FREESTYLE) lancets Use as directed to test once a day 100 each 3   linaclotide (LINZESS) 145 MCG CAPS capsule Take 1 capsule (145 mcg total) by mouth daily before breakfast. 30 capsule 2   LOTEMAX 0.5 % ophthalmic suspension Place 1 drop into both eyes 4 (four) times daily as needed for irritation.     meloxicam (MOBIC) 15 MG tablet TAKE 1 TABLET(15 MG) BY MOUTH DAILY 30 tablet 0   Multiple Vitamin (MULTIVITAMIN WITH MINERALS) TABS tablet Take 1 tablet by mouth daily.     Multiple Vitamins-Minerals (ZINC PO) Take 1 tablet by mouth daily.     Omega-3 Fatty Acids (FISH OIL PO) Take 1 capsule by mouth daily.     ondansetron (ZOFRAN-ODT) 4 MG disintegrating tablet Dissolve 1 tablet by mouth every 8 hours as needed for nausea for up to 7 days 20 tablet 0   Polyvinyl Alcohol-Povidone (CLEAR EYES NATURAL TEARS OP) Place 1 drop into both eyes daily as needed (dry eyes).     VITAMIN E PO Take 1 capsule by mouth daily.     No current facility-administered medications on file prior to visit.     Objective:  Objective  Physical Exam Constitutional:      General: She is not in acute distress.    Appearance: Normal appearance. She is not ill-appearing or toxic-appearing.  HENT:     Head: Normocephalic and atraumatic.     Right Ear: Tympanic membrane, ear canal and external ear normal.     Left Ear: Tympanic membrane, ear canal and external ear normal.     Nose: No congestion or rhinorrhea.  Eyes:     Extraocular Movements: Extraocular movements intact.     Pupils: Pupils are equal, round, and reactive to light.  Cardiovascular:     Rate and Rhythm: Normal rate and regular rhythm.     Pulses: Normal pulses.     Heart sounds: Normal heart sounds. No murmur heard. Pulmonary:     Effort: Pulmonary effort is normal. No respiratory distress.     Breath sounds: Normal breath sounds. No wheezing, rhonchi or rales.   Abdominal:     General: Bowel sounds are normal.     Palpations: Abdomen is soft. There is no mass.     Tenderness: There is no abdominal tenderness. There is no guarding.     Hernia: No hernia is present.  Musculoskeletal:        General: Normal range of motion.     Cervical back: Normal range of motion and neck supple.  Skin:    General: Skin is warm and dry.  Neurological:     Mental Status: She is alert and oriented to person, place, and time.  Psychiatric:        Behavior: Behavior normal.   BP 116/68    Pulse 80    Temp 98.9 F (37.2 C)    Resp 16  Ht _0  (1.549 m)    Wt 192 lb 6.4 oz (87.3 kg)    SpO2 95%    BMI 36.35 kg/m  Wt Readings from Last 3 Encounters:  06/08/21 192 lb 6.4 oz (87.3 kg)  05/31/21 189 lb 6.4 oz (85.9 kg)  05/18/21 192 lb (87.1 kg)     Lab Results  Component Value Date   WBC 8.6 02/21/2021   HGB 14.2 02/21/2021   HCT 43.6 02/21/2021   PLT 383 02/21/2021   GLUCOSE 125 (H) 01/23/2021   CHOL 106 11/10/2020   TRIG 234.0 (H) 11/10/2020   HDL 45.80 11/10/2020   LDLDIRECT 20.0 11/10/2020   LDLCALC 25 12/03/2019   ALT 36 (H) 01/18/2021   AST 23 01/18/2021   NA 139 01/23/2021   K 3.7 01/23/2021   CL 110 01/23/2021   CREATININE 0.76 01/23/2021   BUN 20 01/23/2021   CO2 20 (L) 01/23/2021   TSH 1.74 03/14/2021   HGBA1C 7.1 (H) 01/23/2021   MICROALBUR 3.9 (H) 05/20/2019    MM 3D SCREEN BREAST BILATERAL  Result Date: 05/16/2021 CLINICAL DATA:  Screening. EXAM: DIGITAL SCREENING BILATERAL MAMMOGRAM WITH TOMOSYNTHESIS AND CAD TECHNIQUE: Bilateral screening digital craniocaudal and mediolateral oblique mammograms were obtained. Bilateral screening digital breast tomosynthesis was performed. The images were evaluated with computer-aided detection. COMPARISON:  Previous exam(s). ACR Breast Density Category a: The breast tissue is almost entirely fatty. FINDINGS: There are no findings suspicious for malignancy. IMPRESSION: No mammographic evidence of  malignancy. A result letter of this screening mammogram will be mailed directly to the patient. RECOMMENDATION: Screening mammogram in one year. (Code:SM-B-01Y) BI-RADS CATEGORY  1: Negative. Electronically Signed   By: Ammie Ferrier M.D.   On: 05/16/2021 11:13     Assessment & Plan:  Plan    Meds ordered this encounter  Medications   mometasone (ELOCON) 0.1 % cream    Sig: Apply to affected area daily.    Dispense:  135 g    Refill:  1   tretinoin (RETIN-A) 0.025 % cream    Sig: Apply to affected area at bedtime.    Dispense:  135 g    Refill:  1   albuterol (VENTOLIN HFA) 108 (90 Base) MCG/ACT inhaler    Sig: Inhale 2 puffs into the lungs every 6 (six) hours as needed for wheezing or shortness of breath.    Dispense:  54 g    Refill:  1   dapagliflozin propanediol (FARXIGA) 10 MG TABS tablet    Sig: Take 1 tablet (10 mg total) by mouth daily.    Dispense:  90 tablet    Refill:  1   amLODipine (NORVASC) 5 MG tablet    Sig: Take 1 tablet (5 mg total) by mouth daily.    Dispense:  90 tablet    Refill:  1   atorvastatin (LIPITOR) 10 MG tablet    Sig: Take 1 tablet (10 mg total) by mouth daily.    Dispense:  90 tablet    Refill:  1   carvedilol (COREG) 25 MG tablet    Sig: TAKE 1 TABLET BY MOUTH 2 TIMES DAILY WITH A MEAL.    Dispense:  180 tablet    Refill:  1   cetirizine (ZYRTEC) 10 MG tablet    Sig: Take 1 tablet (10 mg total) by mouth daily.    Dispense:  90 tablet    Refill:  1   fluticasone (FLONASE) 50 MCG/ACT nasal spray    Sig:  Place 2 sprays into both nostrils daily as needed for allergies.    Dispense:  48 g    Refill:  1   gabapentin (NEURONTIN) 300 MG capsule    Sig: Take 1 capsule (300 mg total) by mouth at bedtime.    Dispense:  90 capsule    Refill:  1   hydrochlorothiazide (HYDRODIURIL) 25 MG tablet    Sig: Take 1 tablet (25 mg total) by mouth daily.    Dispense:  90 tablet    Refill:  1   hyoscyamine (ANASPAZ) 0.125 MG TBDP disintergrating  tablet    Sig: Place 1 tablet (0.125 mg total) under the tongue every 6 (six) hours as needed for cramping.    Dispense:  60 tablet    Refill:  2   losartan (COZAAR) 100 MG tablet    Sig: TAKE 1 TABLET BY MOUTH DAILY *TAKE WITH HCTZ    Dispense:  90 tablet    Refill:  1   metFORMIN (GLUCOPHAGE) 500 MG tablet    Sig: Take 1 tablet (500 mg total) by mouth 2 (two) times daily with a meal.    Dispense:  180 tablet    Refill:  1   potassium chloride SA (KLOR-CON M) 20 MEQ tablet    Sig: Take 1 tablet (20 mEq total) by mouth daily.    Dispense:  90 tablet    Refill:  1   topiramate (TOPAMAX) 50 MG tablet    Sig: Take 1 tablet (50 mg total) by mouth daily.    Dispense:  90 tablet    Refill:  1    Problem List Items Addressed This Visit     Hyperlipidemia    Encourage heart healthy diet such as MIND or DASH diet, increase exercise, avoid trans fats, simple carbohydrates and processed foods, consider a krill or fish or flaxseed oil cap daily. Tolerating Atorvastatin      Relevant Medications   amLODipine (NORVASC) 5 MG tablet   atorvastatin (LIPITOR) 10 MG tablet   carvedilol (COREG) 25 MG tablet   hydrochlorothiazide (HYDRODIURIL) 25 MG tablet   losartan (COZAAR) 100 MG tablet   Other Relevant Orders   Lipid panel   TSH   Diabetes mellitus without complication (HCC) - Primary   Relevant Medications   dapagliflozin propanediol (FARXIGA) 10 MG TABS tablet   atorvastatin (LIPITOR) 10 MG tablet   losartan (COZAAR) 100 MG tablet   metFORMIN (GLUCOPHAGE) 500 MG tablet   Other Relevant Orders   Hemoglobin A1c   Comprehensive metabolic panel   TSH   Obesity    Encouraged heart healthy diet, decrease po intake and increase exercise as tolerated. Needs 7-8 hours of sleep nightly. Avoid trans fats, eat small, frequent meals every 4-5 hours with lean proteins, complex carbs and healthy fats. Minimize simple carbs, high fat foods and processed foods      Relevant Medications    dapagliflozin propanediol (FARXIGA) 10 MG TABS tablet   metFORMIN (GLUCOPHAGE) 500 MG tablet   Diarrhea   Relevant Orders   CBC   Abdominal pain    Improved but does improve with Hyoscyamine.       Allergies    Refill given on Flonase.      Type 2 diabetes mellitus with hyperglycemia, without long-term current use of insulin (HCC)    hgba1c acceptable, minimize simple carbs. Increase exercise as tolerated. Continue current meds. Given Prevnar 20       Relevant Medications   dapagliflozin propanediol (FARXIGA)  10 MG TABS tablet   atorvastatin (LIPITOR) 10 MG tablet   losartan (COZAAR) 100 MG tablet   metFORMIN (GLUCOPHAGE) 500 MG tablet   Other Visit Diagnoses     Type 2 diabetes mellitus without complication, without long-term current use of insulin (HCC)       Relevant Medications   dapagliflozin propanediol (FARXIGA) 10 MG TABS tablet   atorvastatin (LIPITOR) 10 MG tablet   losartan (COZAAR) 100 MG tablet   metFORMIN (GLUCOPHAGE) 500 MG tablet   Need for pneumococcal vaccination       Relevant Orders   Pneumococcal conjugate vaccine 20-valent (Completed)       Follow-up: Return in about 3 months (around 09/06/2021) for f/u visit.  I, Suezanne Jacquet, acting as a scribe for Penni Homans, MD, have documented all relevent documentation on behalf of Penni Homans, MD, as directed by Penni Homans, MD while in the presence of Penni Homans, MD. DO:06/08/21.  I, Mosie Lukes, MD personally performed the services described in this documentation. All medical record entries made by the scribe were at my direction and in my presence. I have reviewed the chart and agree that the record reflects my personal performance and is accurate and complete

## 2021-06-08 NOTE — Patient Instructions (Addendum)
Mix Miralax with benefiber, and if you get diarrhea, then drop Miralax and continue taking benefiber.  Add Vicks VapoRub every night  Contact us first of the year for continuous glucose monitor

## 2021-06-08 NOTE — Assessment & Plan Note (Signed)
Encouraged heart healthy diet, decrease po intake and increase exercise as tolerated. Needs 7-8 hours of sleep nightly. Avoid trans fats, eat small, frequent meals every 4-5 hours with lean proteins, complex carbs and healthy fats. Minimize simple carbs, high fat foods and processed foods

## 2021-06-08 NOTE — Assessment & Plan Note (Signed)
Refill given on Flonase.

## 2021-06-09 ENCOUNTER — Other Ambulatory Visit (HOSPITAL_COMMUNITY): Payer: Self-pay

## 2021-06-16 ENCOUNTER — Encounter: Payer: Self-pay | Admitting: Gastroenterology

## 2021-06-16 NOTE — Telephone Encounter (Signed)
Dr. Loletha Carrow, as DOD please see note from patient and advise.   [facility] pt with a hx of bloating, early satiety, GERD, RUQ pain

## 2021-06-19 ENCOUNTER — Other Ambulatory Visit (HOSPITAL_COMMUNITY): Payer: Self-pay

## 2021-06-20 NOTE — Telephone Encounter (Signed)
Monique Wiggins,  This is a note from one of your patients last Friday.  I did not realize it had been sent to my inbox as DOD that day.  HD

## 2021-06-22 ENCOUNTER — Other Ambulatory Visit (HOSPITAL_COMMUNITY): Payer: Self-pay

## 2021-06-22 ENCOUNTER — Encounter: Payer: Self-pay | Admitting: Family Medicine

## 2021-06-23 ENCOUNTER — Other Ambulatory Visit (HOSPITAL_COMMUNITY): Payer: Self-pay

## 2021-06-23 MED ORDER — METOCLOPRAMIDE HCL 5 MG PO TABS
5.0000 mg | ORAL_TABLET | Freq: Three times a day (TID) | ORAL | 1 refills | Status: DC
  Filled 2021-06-23: qty 90, 30d supply, fill #0

## 2021-06-26 ENCOUNTER — Telehealth: Payer: Self-pay | Admitting: Family Medicine

## 2021-06-26 ENCOUNTER — Other Ambulatory Visit (HOSPITAL_COMMUNITY): Payer: Self-pay

## 2021-06-26 ENCOUNTER — Other Ambulatory Visit: Payer: Self-pay

## 2021-06-26 ENCOUNTER — Ambulatory Visit (INDEPENDENT_AMBULATORY_CARE_PROVIDER_SITE_OTHER): Payer: 59 | Admitting: Internal Medicine

## 2021-06-26 ENCOUNTER — Encounter: Payer: Self-pay | Admitting: *Deleted

## 2021-06-26 ENCOUNTER — Encounter: Payer: Self-pay | Admitting: Internal Medicine

## 2021-06-26 ENCOUNTER — Telehealth: Payer: Self-pay | Admitting: *Deleted

## 2021-06-26 VITALS — BP 124/82 | HR 65 | Ht 61.0 in | Wt 190.2 lb

## 2021-06-26 DIAGNOSIS — H0520 Unspecified exophthalmos: Secondary | ICD-10-CM

## 2021-06-26 DIAGNOSIS — E119 Type 2 diabetes mellitus without complications: Secondary | ICD-10-CM

## 2021-06-26 DIAGNOSIS — E781 Pure hyperglyceridemia: Secondary | ICD-10-CM

## 2021-06-26 DIAGNOSIS — E1165 Type 2 diabetes mellitus with hyperglycemia: Secondary | ICD-10-CM

## 2021-06-26 LAB — POCT GLYCOSYLATED HEMOGLOBIN (HGB A1C): Hemoglobin A1C: 7.2 % — AB (ref 4.0–5.6)

## 2021-06-26 MED ORDER — DAPAGLIFLOZIN PROPANEDIOL 10 MG PO TABS
10.0000 mg | ORAL_TABLET | Freq: Every day | ORAL | 3 refills | Status: DC
  Filled 2021-06-26 – 2021-11-01 (×2): qty 90, 90d supply, fill #0
  Filled 2022-02-13: qty 90, 90d supply, fill #1
  Filled 2022-05-22: qty 90, 90d supply, fill #2

## 2021-06-26 MED ORDER — GLIPIZIDE 5 MG PO TABS
2.5000 mg | ORAL_TABLET | Freq: Every day | ORAL | 3 refills | Status: DC
  Filled 2021-06-26: qty 45, 90d supply, fill #0
  Filled 2021-10-05: qty 45, 90d supply, fill #1
  Filled 2022-02-13: qty 45, 90d supply, fill #2
  Filled 2022-04-19 – 2022-04-26 (×3): qty 45, 90d supply, fill #3

## 2021-06-26 MED ORDER — METFORMIN HCL 500 MG PO TABS
500.0000 mg | ORAL_TABLET | Freq: Every day | ORAL | 3 refills | Status: DC
  Filled 2021-06-26: qty 90, 90d supply, fill #0

## 2021-06-26 NOTE — Progress Notes (Signed)
°Name: Monique Wiggins  °MRN/ DOB: [id number], 12/06/1965   °Age/ Sex: 55 y.o., female   ° °PCP: Blyth, Stacey A, MD   °Reason for Endocrinology Evaluation: Type 2 Diabetes Mellitus  °   °Date of Initial Endocrinology Visit: 03/14/2021  ° ° °PATIENT IDENTIFIER: Monique Wiggins is a 55 y.o. female with a past medical history of T2DM. The patient presented for initial endocrinology clinic visit on 03/14/2021 for consultative assistance with her diabetes management.  ° ° °HPI: °Monique Wiggins is accompanied by her spouse today  ° ° °Diagnosed with DM 3 yrs ago  °Prior Medications tried/Intolerance: N/A °Currently checking blood sugars occasionally  °Hypoglycemia episodes : no            °Hemoglobin A1c has ranged from 6.9% in 2021, peaking at 8.8% in 2020. 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 ° ° °Son with Graves' disease  ° ° °SUBJECTIVE:  ° °During the last visit (03/14/2021): A1c 7.1% We continued Farxiga and metformin  ° ° ° ° ° °Today (06/26/21): Monique Wiggins is here for a follow up on diabetes management. She checks her blood sugars rarely . The patient has not had hypoglycemic episodes since the last clinic visit ° °Her gastroparesis has flared up with nausea and vomiting, currently on Reglan through GI  ° °She has been taking less metformin at once a day due to diarrhea .She is not sure if it  ° ° ° ° °HOME DIABETES REGIMEN: °Farxiga 10 mg daily  °Metformin 500 mg BID  ° ° ° ° °Statin: yes °ACE-I/ARB: yes °Prior Diabetic Education: no  ° ° °METER DOWNLOAD SUMMARY: Did not bring  ° ° °DIABETIC COMPLICATIONS: °Microvascular complications:  ° °Denies: CKD, neuropathy , retinopathy °Last eye exam: Completed 11/2020 ° °Macrovascular complications:  ° °Denies: CAD, PVD, CVA ° ° °PAST HISTORY: °Past Medical History:   °Past Medical History:  °Diagnosis Date  ° Allergy   ° Arthritis   ° Back pain   ° Carpal tunnel syndrome   ° Diabetes mellitus without complication (HCC)   ° DVT (deep venous thrombosis) (HCC)   ° after long car trip  ° Dyspnea   ° Gastroparesis   ° GERD (gastroesophageal reflux disease)   ° H. pylori infection   ° 1997  ° Heart murmur   ° Hypertension   ° Hypertriglyceridemia 02/22/2017  ° Hypertriglyceridemia 02/22/2017  ° Migraines   ° Sickle cell trait (HCC)   ° Sleep apnea   ° states she no longer has OSA lost weight  ° °Past Surgical History:  °Past Surgical History:  °Procedure Laterality Date  ° ABDOMINAL HYSTERECTOMY    ° ovaries left in place  ° CARPAL TUNNEL RELEASE Right 01/19/2019  ° Procedure: ENDOSCOPIC CARPAL TUNNEL RELEASE;  Surgeon: Thompson, David, MD;  Location: [facility];  Service: Orthopedics;  Laterality: Right;  ° CARPOMETACARPEL SUSPENSION PLASTY Right 01/19/2019  ° Procedure: RIGHT THUMB SUSPENSION ARTHROPLASTY;  Surgeon: Thompson, David, MD;  Location: [facility];  Service: Orthopedics;  Laterality: Right;  PREOP BLOCK  ° CHOLECYSTECTOMY  01/24/2021  ° TOE SURGERY    ° left great toe, repair of severed tendon  ° TONSILLECTOMY    ° tummy tuck    °  °Social History:    reports that she has never smoked. She has never used smokeless tobacco. She reports that she does not drink alcohol and does not use drugs. °Family History:  °Family History  °Problem Relation Age of Onset  ° Diabetes type II Mother   ° High blood pressure Mother   ° Diabetes Mellitus II Father   ° Pulmonary fibrosis Father   ° Hypertension Father   ° Pulmonary disease Father   ° High Cholesterol Father   ° Hypertension Sister   ° Diabetes Sister   ° Stomach cancer Maternal Grandmother   °     possibility that this was colon cancer instead-patient and her mother have differing recollection  ° Cancer Maternal Grandfather   °     lung, smoker  ° Allergic Disorder Son   ° Hyperthyroidism Son   °  Allergic Disorder Son   ° Asthma Son   ° Esophageal cancer Neg Hx   ° Pancreatic cancer Neg Hx   ° Rectal cancer Neg Hx   ° Colon cancer Neg Hx   ° ° ° °HOME MEDICATIONS: °Allergies as of 06/26/2021   ° °   Reactions  ° Pollen Extract Swelling  ° Shellfish Allergy Swelling  ° °  ° °  °Medication List  °  ° °  ° Accurate as of June 26, 2021  4:18 PM. If you have any questions, ask your nurse or doctor.  °  °  ° °  ° °Advair Diskus 500-50 MCG/ACT Aepb °Generic drug: fluticasone-salmeterol °Inhale 1 puff into the lungs in the morning and at bedtime. °  °albuterol 108 (90 Base) MCG/ACT inhaler °Commonly known as: VENTOLIN HFA °Inhale 2 puffs into the lungs every 6 (six) hours as needed for wheezing or shortness of breath. °  °aluminum-magnesium hydroxide-simethicone 200-200-20 MG/5ML Susp °Commonly known as: MAALOX °Take 30 mLs by mouth 3 (three) times daily with meals as needed. °  °amLODipine 5 MG tablet °Commonly known as: NORVASC °Take 1 tablet (5 mg total) by mouth daily. °  °atorvastatin 10 MG tablet °Commonly known as: LIPITOR °Take 1 tablet (10 mg total) by mouth daily. °  °B-12 SL °Place 1 capsule under the tongue daily. °  °carvedilol 25 MG tablet °Commonly known as: COREG °TAKE 1 TABLET BY MOUTH 2 TIMES DAILY WITH A MEAL. °  °cetirizine 10 MG tablet °Commonly known as: ZYRTEC °Take 1 tablet (10 mg total) by mouth daily. °  °cholecalciferol 25 MCG (1000 UNIT) tablet °Commonly known as: VITAMIN D3 °Take 1,000 Units by mouth daily. °  °CLEAR EYES NATURAL TEARS OP °Place 1 drop into both eyes daily as needed (dry eyes). °  °dapagliflozin propanediol 10 MG Tabs tablet °Commonly known as: FARXIGA °Take 1 tablet (10 mg total) by mouth daily. °  °FISH OIL PO °Take 1 capsule by mouth daily. °  °fluticasone 50 MCG/ACT nasal spray °Commonly known as: FLONASE °Place 2 sprays into both nostrils daily as needed for allergies. °  °freestyle lancets °Use as directed to test once a day °  °FREESTYLE LITE test strip °Generic  drug: glucose blood °Use as directed to test blood sugar daily in the afternoon °  °FreeStyle Lite w/Device Kit °Use as directed to test blood sugar once a day °  °gabapentin 300 MG capsule °Commonly known as: NEURONTIN °Take 1 capsule (300 mg total) by mouth at bedtime. °  °glipiZIDE 5 MG tablet °Commonly known as: GLUCOTROL °Take 1/2 tablet (2.5 mg total) by mouth daily before breakfast. °Started by:   Ibtehal J Shamleffer, MD °  °hydrochlorothiazide 25 MG tablet °Commonly known as: HYDRODIURIL °Take 1 tablet (25 mg total) by mouth daily. °  °HYDROcodone-acetaminophen 5-325 MG tablet °Commonly known as: NORCO/VICODIN °Take 1 tablet by mouth every 6 (six) hours as needed for moderate pain. °  °hyoscyamine 0.125 MG Tbdp disintergrating tablet °Commonly known as: ANASPAZ °Place 1 tablet (0.125 mg total) under the tongue every 6 (six) hours as needed for cramping. °  °ibuprofen 200 MG tablet °Commonly known as: ADVIL °Take 400 mg by mouth every 6 (six) hours as needed for headache or moderate pain. °  °linaclotide 145 MCG Caps capsule °Commonly known as: LINZESS °Take 1 capsule (145 mcg total) by mouth daily before breakfast. °  °losartan 100 MG tablet °Commonly known as: COZAAR °TAKE 1 TABLET BY MOUTH DAILY *TAKE WITH HCTZ °  °Lotemax 0.5 % ophthalmic suspension °Generic drug: loteprednol °Place 1 drop into both eyes 4 (four) times daily as needed for irritation. °  °meloxicam 15 MG tablet °Commonly known as: MOBIC °TAKE 1 TABLET(15 MG) BY MOUTH DAILY °  °metFORMIN 500 MG tablet °Commonly known as: GLUCOPHAGE °Take 1 tablet (500 mg total) by mouth daily with breakfast. °What changed: when to take this °Changed by: Ibtehal J Shamleffer, MD °  °metoCLOPramide 5 MG tablet °Commonly known as: Reglan °Take 1 tablet (5 mg total) by mouth 3 (three) times daily before meals. °  °mometasone 0.1 % cream °Commonly known as: ELOCON °Apply to affected area daily. °  °multivitamin with minerals Tabs tablet °Take 1 tablet by mouth  daily. °  °ondansetron 4 MG disintegrating tablet °Commonly known as: ZOFRAN-ODT °Dissolve 1 tablet by mouth every 8 hours as needed for nausea for up to 7 days °  °potassium chloride SA 20 MEQ tablet °Commonly known as: KLOR-CON M °Take 1 tablet (20 mEq total) by mouth daily. °  °topiramate 50 MG tablet °Commonly known as: TOPAMAX °Take 1 tablet (50 mg total) by mouth daily. °  °tretinoin 0.025 % cream °Commonly known as: RETIN-A °Apply to affected area at bedtime. °  °VITAMIN C PO °Take 1 capsule by mouth daily. °  °VITAMIN E PO °Take 1 capsule by mouth daily. °  °ZINC PO °Take 1 tablet by mouth daily. °  ° °  ° ° ° °ALLERGIES: °Allergies  °Allergen Reactions  ° Pollen Extract Swelling  ° Shellfish Allergy Swelling  ° ° ° °REVIEW OF SYSTEMS: °A comprehensive ROS was conducted with the patient and is negative except as per HPI and below:  °Review of Systems  °Eyes:  Positive for blurred vision.  °Gastrointestinal:  Positive for diarrhea and nausea. Negative for vomiting.  ° °  °OBJECTIVE:  ° °VITAL SIGNS: BP 124/82 (BP Location: Right Arm, Patient Position: Sitting, Cuff Size: Normal)    Pulse 65    Ht 5' 1" (1.549 m)    Wt 190 lb 3.2 oz (86.3 kg)    SpO2 99%    BMI 35.94 kg/m²   ° °PHYSICAL EXAM:  °General: Pt appears well and is in NAD  °Neck: General: Supple without adenopathy or carotid bruits. °Thyroid: Thyroid size normal.  No goiter or nodules appreciated.  °Lungs: Clear with good BS bilat with no rales, rhonchi, or wheezes  °Heart: RRR with normal S1 and S2 and no gallops; no murmurs; no rub  °Abdomen: Normoactive bowel sounds, soft, nontender, without masses or organomegaly palpable  °Extremities:  °Lower extremities - No pretibial edema. No lesions.  °Skin: Normal texture and   temperature to palpation. No rash noted. No Acanthosis nigricans/skin tags. No lipohypertrophy.  °Neuro: MS is good with appropriate affect, pt is alert and Ox3  ° ° °DM foot exam: 03/14/2021 ° °The skin of the feet is intact without  sores or ulcerations. °The pedal pulses are 2+ on right and 2+ on left. °The sensation is intact to a screening 5.07, 10 gram monofilament bilaterally ° ° °DATA REVIEWED: ° °Lab Results  °Component Value Date  ° HGBA1C 7.2 (A) 06/26/2021  ° HGBA1C 7.1 (H) 01/23/2021  ° HGBA1C 7.3 (H) 11/10/2020  ° °Lab Results  °Component Value Date  ° MICROALBUR 3.9 (H) 05/20/2019  ° LDLCALC 25 12/03/2019  ° CREATININE 0.76 01/23/2021  ° °Lab Results  °Component Value Date  ° MICRALBCREAT 2.2 05/20/2019  ° ° °Lab Results  °Component Value Date  ° CHOL 106 11/10/2020  ° HDL 45.80 11/10/2020  ° LDLCALC 25 12/03/2019  ° LDLDIRECT 20.0 11/10/2020  ° TRIG 234.0 (H) 11/10/2020  ° CHOLHDL 2 11/10/2020  °     °Results for [place], Devinne CAMPBELL (MRN [id number]) as of 03/14/2021 13:08 ° Ref. Range 03/14/2021 09:06  °TSH Latest Ref Range: 0.35 - 5.50 uIU/mL 1.74  °T4,Free(Direct) Latest Ref Range: 0.60 - 1.60 ng/dL 0.77  ° ° ° ° °CT Orbits 09/22/2020 ° °Periorbital soft tissues: Normal.  ° °Globes: Bilateral proptosis, mild. Symmetrical, normal contour and density.  ° °Optic nerves: Symmetrical, normal size and contour.  ° °Extraocular muscles: Symmetrical, normal size.  ° °Contrast: No abnormal enhancement to suggest mass or infection. Medialization with retropharyngeal course of the LEFT internal carotid artery.  ° °CONCLUSION:  ° ° °Mild bilateral proptosis which is nonspecific and may be secondary to proliferation of orbital fat of unknown etiology. ° °ASSESSMENT / PLAN / RECOMMENDATIONS:  ° °1) Type 2 Diabetes Mellitus, Optimally controlled, Without complications - Most recent A1c of 7.2 %. Goal A1c < 7.0 %.   ° ° °- A1c stable at 7.2 % . Unfortunately due to gastroparesis she has been eating chips and juicing which causes hyperglycemia. We discussed figuring out proper amount of portions  °- She has been reducing the dose of Metformin by 50 % as she believes this may be contributing to diarrhea but she is not sure  °- We discussed  reducing metfomrin and starting Glipizide as below , cautioned against hypoglycemia  °- Not a candidate for GLP-1 agonists due to gastroparesis  ° ° ° °MEDICATIONS: °Continue Farxiga 10 mg daily  °Decrease Metformin 500 mg 1 tablet a daily  °Start Glipizide 5 mg, half a tablet before first meal of the day  ° ° °EDUCATION / INSTRUCTIONS: °BG monitoring instructions: Patient is instructed to check her blood sugars 1 times a day, fasting . °Call [facility] if: BG persistently < 70 °I reviewed the Rule of 15 for the treatment of hypoglycemia in detail with the patient. Literature supplied. ° ° °2) Diabetic complications:  °Eye: Does not have known diabetic retinopathy.  °Neuro/ Feet: Does not have known diabetic peripheral neuropathy. °Renal: Patient does not have known baseline CKD. She is on an ACEI/ARB at present. ° °3) Dyslipidemia:   ° ° °- Tg as high as 563 mg/dL in 05/2019, this has been trending down . LDL at goal  °- Will monitor  ° ° ° °Medication  °Continue Atorvastatin 10 mg daily  ° ° ° °4) Proptosis : ° °- She is under the care of Ophthalmology  °- CT orbit 09/2020 showed   mild proptosis due to proliferation of orbital fat  - Son with Graves' disease, TFT's, TSI and TRAb  have all been normal    F/U in 4 months    Signed electronically by: Mack Guise, MD  [facility]  [facility] [address] [place], [place] [address] Phone: [phone] FAX: [phone]   CC: [address], [address] [place] [address] Phone: [phone]  Fax: [phone]    Return to Endocrinology clinic as below: Future Appointments  Date Time Provider [place]  07/20/2021  4:30 PM Clydell Hakim, RD [place] [facility]  08/30/2021  3:00 PM [facility] PFT ROOM [facility] None  09/12/2021  4:00 PM Hunsucker, Bonna Gains, MD [facility] None  11/01/2021  3:20 PM Cai Anfinson, Melanie Crazier, MD [facility] None

## 2021-06-26 NOTE — Patient Instructions (Signed)
-   Continue Farxiga 10 mg 1 tablet daily  - Decrease Metformin 500 mg 1 tablet a daily  - Start Glipizide 5 mg, half a tablet before first meal of the day     HOW TO TREAT LOW BLOOD SUGARS (Blood sugar LESS THAN 70 MG/DL) Please follow the RULE OF 15 for the treatment of hypoglycemia treatment (when your (blood sugars are less than 70 mg/dL)   STEP 1: Take 15 grams of carbohydrates when your blood sugar is low, which includes:  3-4 GLUCOSE TABS  OR 3-4 OZ OF JUICE OR REGULAR SODA OR ONE TUBE OF GLUCOSE GEL    STEP 2: RECHECK blood sugar in 15 MINUTES STEP 3: If your blood sugar is still low at the 15 minute recheck --> then, go back to STEP 1 and treat AGAIN with another 15 grams of carbohydrates.

## 2021-06-26 NOTE — Telephone Encounter (Signed)
[place] called from [place] wanting to inform provider that appeal request from med trentinoin was approved for 12 months. [phone]. Documents will be faxed over\ with information.

## 2021-06-26 NOTE — Telephone Encounter (Signed)
Prior Monique Wiggins was submitted for tretinoin.  Prior Monique Wiggins was denied.  Appeal letter done and faxed today.

## 2021-06-27 NOTE — Telephone Encounter (Signed)
Chism, Shaakira A, CMA routed conversation to You 59 minutes ago (8:38 AM)   Kennon Holter, [place] N routed conversation to [employer], CMA 16 hours ago (4:49 PM)   Blount, [place] N 16 hours ago (4:49 PM)   CB [place] called from [place] wanting to inform provider that appeal request from med trentinoin was approved for 12 months. [phone]. Documents will be faxed over\ with information.

## 2021-07-04 ENCOUNTER — Other Ambulatory Visit (HOSPITAL_COMMUNITY): Payer: Self-pay

## 2021-07-05 ENCOUNTER — Other Ambulatory Visit (HOSPITAL_COMMUNITY): Payer: Self-pay

## 2021-07-06 ENCOUNTER — Other Ambulatory Visit (HOSPITAL_COMMUNITY): Payer: Self-pay

## 2021-07-20 ENCOUNTER — Other Ambulatory Visit: Payer: Self-pay

## 2021-07-20 ENCOUNTER — Encounter: Payer: 59 | Attending: Internal Medicine | Admitting: Dietician

## 2021-07-20 DIAGNOSIS — K3184 Gastroparesis: Secondary | ICD-10-CM | POA: Diagnosis not present

## 2021-07-20 DIAGNOSIS — E119 Type 2 diabetes mellitus without complications: Secondary | ICD-10-CM | POA: Insufficient documentation

## 2021-07-20 NOTE — Patient Instructions (Addendum)
Consider protein shakes rather than meals when your gastroparesis is flaring.  Be sure you have a small amount of carbohydrates when you eat (15-30 grams from grits, applesauce, cream of rice, etc.)  Consider the following resources: Living well!  With gastroparesis by Lambert Keto, [facility] [email or web address]  Protein goals :  65-80 grams per day Small amounts of carbs (around 30 grams) with each meal Small snacks as needed with no more than 15 grams carbohydrates  Protein shake options include: Premier protein Orgain protein Unjury ([facility]) - chicken broth flavor and others PB2 protein powder that you mix with water (this is low fat)

## 2021-07-20 NOTE — Progress Notes (Signed)
Diabetes Self-Management Education  Visit Type: Follow-up  Appt. Start Time: 1630 Appt. End Time: 1700  07/21/2021  [name], identified by name and date of birth, is a 56 y.o. female with a diagnosis of Diabetes:  .   ASSESSMENT Patient is here today alone. She states that she had a flare up of her gastroparesis. She ate broth and jello for a while and then added shakes and is now on a bland diet. Eggs and oatmeal for breakfast and eggs, potatoes, and oatmeal for lunch. She got a juicer (removes pulp and all fiber) but the juice also caused problems. She questions if she should try the Low FODMAP diet. Sugar free popsickles at night rather than other snacks. She started the free exercise class last week and does zumba twice per week.   Her husband wants her to join a gym. Filled out paperwork online for the Dexcom  History includes Type 2 diabetes, nausea after gallbladder removed 02/2021, HTN, HLD, OSA (dx years ago and no longer uses c-pap), gastroparesis Poor sleep (3 hours per night), wakes frequently to use the restroom Back pain A1C  7.1% 01/23/2021, cholesterol 106, HDL 45, Triglycerides 234 10/2020.   Medications include:  Farxiga, Metformin, glipizide, Vitamin B-12   Weight hx: 192 lbs 05/18/2021 187 lbs 03/15/2021 171 lbs 01/11/2020 155 lbs lowest adult weight 2018 195 lbs highest adult weight  10/17/2017   Patient lives with her husband.  They share shopping and cooking.  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  She is a Geophysicist/field seismologist (babies and dogs). Allergic to shellfish Lactose intolerant Spinach and all greens causes diarrhea.  Lettuce does not cause symptoms. Doesn't tolerate raw onions Her husband is very supportive but enjoys feeding her and likes to snack at night. Increased stress this year.  Weight 193 lb (87.5 kg). Body mass index is 36.47 kg/m.   Diabetes Self-Management Education - 07/21/21 1348        Visit Information   Visit Type Follow-up      Initial Visit   Are you taking your medications as prescribed? Yes      Psychosocial Assessment   Other persons present Patient    Patient Concerns Nutrition/Meal planning;Glycemic Control    Special Needs None    Preferred Learning Style No preference indicated    Learning Readiness Ready      Pre-Education Assessment   Patient understands the diabetes disease and treatment process. Demonstrates understanding / competency    Patient understands incorporating nutritional management into lifestyle. Needs Review    Patient undertands incorporating physical activity into lifestyle. Demonstrates understanding / competency    Patient understands using medications safely. Demonstrates understanding / competency    Patient understands monitoring blood glucose, interpreting and using results Demonstrates understanding / competency    Patient understands prevention, detection, and treatment of acute complications. Demonstrates understanding / competency    Patient understands prevention, detection, and treatment of chronic complications. Demonstrates understanding / competency    Patient understands how to develop strategies to address psychosocial issues. Demonstrates understanding / competency    Patient understands how to develop strategies to promote health/change behavior. Needs Review      Complications   Last HgB A1C per patient/outside source 7.2 %   04/26/2021, 7.1% 01/23/2021   How often do you check your blood sugar? 1-2 times/day    Number of hypoglycemic episodes per month 1    Can you tell when your blood  sugar is low? Yes    What do you do if your blood sugar is low? drank juice      Dietary Intake   Snack (morning) oatmeal, eggs    Lunch oatmeal, eggs, breakfast potatoes    Beverage(s) water, juice, broth      Exercise   Exercise Type Moderate (swimming / aerobic walking)    How many days per week to you exercise? 2    How  many minutes per day do you exercise? 60    Total minutes per week of exercise 120      Patient Education   Previous Diabetes Education Yes (please comment)    Nutrition management  Meal options for control of blood glucose level and chronic complications.   gastropareisis and nutrition   Physical activity and exercise  Role of exercise on diabetes management, blood pressure control and cardiac health.    Medications Reviewed patients medication for diabetes, action, purpose, timing of dose and side effects.    Acute complications Taught treatment of hypoglycemia - the 15 rule.      Individualized Goals (developed by patient)   Nutrition General guidelines for healthy choices and portions discussed   low fat   Physical Activity Exercise 3-5 times per week;45 minutes per day    Monitoring  test my blood glucose as discussed    Reducing Risk examine blood glucose patterns      Patient Self-Evaluation of Goals - Patient rates self as meeting previously set goals (% of time)   Nutrition 50 - 75 %    Physical Activity 50 - 75 %    Medications >75%    Monitoring >75%    Problem Solving >75%    Reducing Risk >75%    Health Coping >75%      Post-Education Assessment   Patient understands the diabetes disease and treatment process. Demonstrates understanding / competency    Patient understands incorporating nutritional management into lifestyle. Needs Review    Patient undertands incorporating physical activity into lifestyle. Demonstrates understanding / competency    Patient understands using medications safely. Demonstrates understanding / competency    Patient understands monitoring blood glucose, interpreting and using results Demonstrates understanding / competency    Patient understands prevention, detection, and treatment of acute complications. Demonstrates understanding / competency    Patient understands prevention, detection, and treatment of chronic complications. Demonstrates  understanding / competency    Patient understands how to develop strategies to address psychosocial issues. Demonstrates understanding / competency             Individualized Plan for Diabetes Self-Management Training:   Learning Objective:  Patient will have a greater understanding of diabetes self-management. Patient education plan is to attend individual and/or group sessions per assessed needs and concerns.   Plan:   Patient Instructions  Consider protein shakes rather than meals when your gastroparesis is flaring.  Be sure you have a small amount of carbohydrates when you eat (15-30 grams from grits, applesauce, cream of rice, etc.)  Consider the following resources: Living well!  With gastroparesis by Lambert Keto, [facility] [email or web address]  Protein goals :  65-80 grams per day Small amounts of carbs (around 30 grams) with each meal Small snacks as needed with no more than 15 grams carbohydrates  Protein shake options include: Premier protein Orgain protein Unjury ([facility]) - chicken broth flavor and others PB2 protein powder that you mix with water (this is low fat)  Expected Outcomes:  Education material provided: Gastroparesis nutrition therapy from AND  If problems or questions, patient to contact team via:  Phone  Future DSME appointment:

## 2021-07-21 ENCOUNTER — Encounter: Payer: Self-pay | Admitting: Family Medicine

## 2021-07-21 ENCOUNTER — Other Ambulatory Visit (HOSPITAL_COMMUNITY): Payer: Self-pay

## 2021-07-21 ENCOUNTER — Telehealth: Payer: Self-pay

## 2021-07-21 ENCOUNTER — Encounter: Payer: Self-pay | Admitting: Dietician

## 2021-07-21 MED ORDER — DEXCOM G6 SENSOR MISC
3 refills | Status: DC
  Filled 2021-07-21: qty 3, 30d supply, fill #0
  Filled 2021-08-18: qty 3, 30d supply, fill #1
  Filled 2021-09-20: qty 3, 30d supply, fill #2
  Filled 2021-10-16: qty 3, 30d supply, fill #3

## 2021-07-21 MED ORDER — DEXCOM G6 TRANSMITTER MISC
0 refills | Status: DC
  Filled 2021-07-21: qty 1, 90d supply, fill #0

## 2021-07-21 MED ORDER — DEXCOM G6 RECEIVER DEVI
0 refills | Status: DC
  Filled 2021-07-21: qty 1, fill #0

## 2021-07-21 NOTE — Telephone Encounter (Signed)
PA initiated via Covermymeds; KEY:   [id number]                              Awaiting determination.

## 2021-07-24 ENCOUNTER — Other Ambulatory Visit (HOSPITAL_COMMUNITY): Payer: Self-pay

## 2021-07-24 NOTE — Telephone Encounter (Signed)
PA approved.   The request has been approved. The authorization is effective for a maximum of 1 fills from 07/24/2021 to 07/23/2022, as long as the member is enrolled in their current health plan. The request was approved as submitted. This request has been approved for 1 receiver per 365 days.An additional authorization has been entered for Dexcom G6 sensor, allowing a maximum of 12 fills with a quantity limit of 3 sensors per 30 days, effective 07/24/2021 through 07/23/2022; please reference authorization [id number].An additional authorization has been entered for [facility] G6 transmitter, allowing a maximum of 4 fills with a quantity limit of 1 transmitter per 90 days, effective 07/24/2021 through 07/23/2022; please reference authorization [id number]. A written notification letter will follow with additional details

## 2021-07-25 ENCOUNTER — Other Ambulatory Visit (HOSPITAL_COMMUNITY): Payer: Self-pay

## 2021-08-09 ENCOUNTER — Telehealth: Payer: Self-pay

## 2021-08-09 NOTE — Telephone Encounter (Signed)
Office notes faxed [phone] to [name] for Dexcom G6. Confirmation received.

## 2021-08-14 DIAGNOSIS — E119 Type 2 diabetes mellitus without complications: Secondary | ICD-10-CM | POA: Diagnosis not present

## 2021-08-18 ENCOUNTER — Other Ambulatory Visit (HOSPITAL_COMMUNITY): Payer: Self-pay

## 2021-08-22 NOTE — Progress Notes (Signed)
° °@Patient ID: Monique Wiggins, female    DOB: 05/02/1966, 56 y.o.   MRN: [id number] ° °Chief Complaint  °Patient presents with  ° Consult  °  Consult due to SOB and it is worse with activity   ° ° °Referring provider: °Blyth, Stacey A, MD ° °HPI:  ° °56 y.o. whom are seen in consultation for evaluation of dyspnea on exertion.  Note from PCP reviewed.  Recent GI note reviewed. ° °Patient notes dyspnea exertion for a while now.  Started maybe couple years ago.  Worse over the last several months.  Worse on inclines or stairs.  No time of day when things are better or worse.  No position makes things better or worse.  No seasonal environmental factors she can identify.  Possibly triggered by viral infection back in 2020.  Use albuterol as needed.  This does seem to help.  She endorses a history of seasonal allergies.  Takes montelukast and Zyrtec.  These seem to help in terms of nasal congestion. ° °Reviewed most recent chest x-ray 08/2020 that reveals clear lungs bilaterally.  Reviewed TTE obtained in the setting of work-up for dyspnea on exertion that demonstrates no significant abnormalities. ° °PMH: Seasonal allergies, diabetes, hypertension °Surgical history: Hysterectomy, carpal tunnel, cholecystectomy, toe surgery °Family history: Mother with diabetes, hypertension, father with diabetes, hypertension, pulmonary fibrosis °Social history: Current smoker, lives in [place] ° °Questionaires / Pulmonary Flowsheets:  ° °ACT:  °No flowsheet data found. ° °MMRC: °mMRC Dyspnea Scale mMRC Score  °05/31/2021 1  ° ° °Epworth:  °No flowsheet data found. ° °Tests:  ° °FENO:  °No results found for: NITRICOXIDE ° °PFT: °No flowsheet data found. ° °WALK:  °No flowsheet data found. ° °Imaging: °Personally reviewed and as per EMR discussion this note ° °Lab Results: °Personally reviewed °CBC °   °Component Value Date/Time  ° WBC 8.6 02/21/2021 1609  ° RBC 5.40 (H) 02/21/2021 1609  ° HGB 14.2 02/21/2021 1609  ° HCT 43.6  02/21/2021 1609  ° PLT 383 02/21/2021 1609  ° MCV 80.7 02/21/2021 1609  ° MCH 26.3 (L) 02/21/2021 1609  ° MCHC 32.6 02/21/2021 1609  ° RDW 14.1 02/21/2021 1609  ° LYMPHSABS 2,348 02/21/2021 1609  ° MONOABS 0.5 11/10/2020 1348  ° EOSABS 361 02/21/2021 1609  ° BASOSABS 60 02/21/2021 1609  ° ° °BMET °   °Component Value Date/Time  ° NA 139 01/23/2021 0923  ° NA 137 12/01/2018 0000  ° K 3.7 01/23/2021 0923  ° CL 110 01/23/2021 0923  ° CO2 20 (L) 01/23/2021 0923  ° GLUCOSE 125 (H) 01/23/2021 0923  ° BUN 20 01/23/2021 0923  ° BUN 17 12/01/2018 0000  ° CREATININE 0.76 01/23/2021 0923  ° CALCIUM 9.6 01/23/2021 0923  ° GFRNONAA >60 01/23/2021 0923  ° GFRAA >60 01/15/2019 1026  ° ° °BNP °No results found for: BNP ° °ProBNP °No results found for: PROBNP ° °Specialty Problems   ° °  ° Pulmonary Problems  ° Dyspnea  ° ° °Allergies  °Allergen Reactions  ° Pollen Extract Swelling  ° Shellfish Allergy Swelling  ° ° °Immunization History  °Administered Date(s) Administered  ° Hepatitis A, Adult 07/25/2020  ° Influenza Inj Mdck Quad Pf 04/04/2017  ° Influenza,inj,Quad PF,6+ Mos 03/03/2018, 02/12/2019, 02/21/2021  ° Influenza-Unspecified 09/29/2016, 04/11/2017  ° PFIZER(Purple Top)SARS-COV-2 Vaccination 06/17/2019, 07/07/2019, 04/09/2020, 01/11/2021  ° PNEUMOCOCCAL CONJUGATE-20 06/08/2021  ° Pneumococcal Polysaccharide-23 12/05/2017  ° Td 02/16/2014  ° Zoster Recombinat (Shingrix) 10/01/2020, 02/21/2021  ° ° °Past   Medical History:  °Diagnosis Date  ° Allergy   ° Arthritis   ° Back pain   ° Carpal tunnel syndrome   ° Diabetes mellitus without complication (HCC)   ° DVT (deep venous thrombosis) (HCC)   ° after long car trip  ° Dyspnea   ° Gastroparesis   ° GERD (gastroesophageal reflux disease)   ° H. pylori infection   ° 1997  ° Heart murmur   ° Hypertension   ° Hypertriglyceridemia 02/22/2017  ° Hypertriglyceridemia 02/22/2017  ° Migraines   ° Sickle cell trait (HCC)   ° Sleep apnea   ° states she no longer has OSA lost weight   ° ° °Tobacco History: °Social History  ° °Tobacco Use  °Smoking Status Never  °Smokeless Tobacco Never  ° °Counseling given: Not Answered ° ° °Continue to not smoke ° °Outpatient Encounter Medications as of 05/31/2021  °Medication Sig  ° aluminum-magnesium hydroxide-simethicone (MAALOX) 200-200-20 MG/5ML SUSP Take 30 mLs by mouth 3 (three) times daily with meals as needed.  ° Ascorbic Acid (VITAMIN C PO) Take 1 capsule by mouth daily.  ° Blood Glucose Monitoring Suppl (FREESTYLE LITE) w/Device KIT Use as directed to test blood sugar once a day  ° cholecalciferol (VITAMIN D3) 25 MCG (1000 UNIT) tablet Take 1,000 Units by mouth daily.  ° Cyanocobalamin (B-12 SL) Place 1 capsule under the tongue daily.  ° fluticasone-salmeterol (ADVAIR DISKUS) 500-50 MCG/ACT AEPB Inhale 1 puff into the lungs in the morning and at bedtime.  ° glucose blood test strip Use as directed to test blood sugar daily in the afternoon  ° HYDROcodone-acetaminophen (NORCO/VICODIN) 5-325 MG tablet Take 1 tablet by mouth every 6 (six) hours as needed for moderate pain.  ° ibuprofen (ADVIL) 200 MG tablet Take 400 mg by mouth every 6 (six) hours as needed for headache or moderate pain.  ° Lancets (FREESTYLE) lancets Use as directed to test once a day  ° linaclotide (LINZESS) 145 MCG CAPS capsule Take 1 capsule (145 mcg total) by mouth daily before breakfast.  ° LOTEMAX 0.5 % ophthalmic suspension Place 1 drop into both eyes 4 (four) times daily as needed for irritation.  ° meloxicam (MOBIC) 15 MG tablet TAKE 1 TABLET(15 MG) BY MOUTH DAILY  ° Multiple Vitamin (MULTIVITAMIN WITH MINERALS) TABS tablet Take 1 tablet by mouth daily.  ° Multiple Vitamins-Minerals (ZINC PO) Take 1 tablet by mouth daily.  ° Omega-3 Fatty Acids (FISH OIL PO) Take 1 capsule by mouth daily.  ° ondansetron (ZOFRAN-ODT) 4 MG disintegrating tablet Dissolve 1 tablet by mouth every 8 hours as needed for nausea for up to 7 days  ° Polyvinyl Alcohol-Povidone (CLEAR EYES NATURAL TEARS  OP) Place 1 drop into both eyes daily as needed (dry eyes).  ° VITAMIN E PO Take 1 capsule by mouth daily.  ° [DISCONTINUED] albuterol (VENTOLIN HFA) 108 (90 Base) MCG/ACT inhaler Inhale 2 puffs into the lungs every 6 (six) hours as needed for wheezing or shortness of breath.  ° [DISCONTINUED] amLODipine (NORVASC) 5 MG tablet TAKE 1 TABLET BY MOUTH DAILY  ° [DISCONTINUED] atorvastatin (LIPITOR) 10 MG tablet TAKE 1 TABLET BY MOUTH DAILY  ° [DISCONTINUED] carvedilol (COREG) 25 MG tablet TAKE 1 TABLET BY MOUTH 2 TIMES DAILY WITH A MEAL.  ° [DISCONTINUED] cetirizine (ZYRTEC) 10 MG tablet Take 1 tablet (10 mg total) by mouth daily. (Patient taking differently: Take 10 mg by mouth daily as needed for allergies.)  ° [DISCONTINUED] dapagliflozin propanediol (FARXIGA) 10 MG TABS tablet TAKE   TAKE 1 TABLET BY MOUTH ONCE A DAY BEFORE BREAKFAST   [DISCONTINUED] fluticasone (FLONASE) 50 MCG/ACT nasal spray Place 2 sprays into both nostrils daily. (Patient taking differently: Place 2 sprays into both nostrils daily as needed for allergies.)   [DISCONTINUED] gabapentin (NEURONTIN) 300 MG capsule TAKE 1 CAPSULE BY MOUTH AT BEDTIME   [DISCONTINUED] hydrochlorothiazide (HYDRODIURIL) 25 MG tablet Take 25 mg by mouth daily.   [DISCONTINUED] hyoscyamine (ANASPAZ) 0.125 MG TBDP disintergrating tablet Place 1 tablet (0.125 mg total) under the tongue every 6 (six) hours as needed for cramping.   [DISCONTINUED] losartan (COZAAR) 100 MG tablet TAKE 1 TABLET BY MOUTH DAILY *TAKE WITH HCTZ   [DISCONTINUED] metFORMIN (GLUCOPHAGE) 500 MG tablet Take 1 tablet (500 mg total) by mouth 2 (two) times daily with a meal.   [DISCONTINUED] mometasone (ELOCON) 0.1 % cream Apply 1 application topically daily.   [DISCONTINUED] topiramate (TOPAMAX) 50 MG tablet Take 1 tablet (50 mg total) by mouth daily.   [DISCONTINUED] tretinoin (RETIN-A) 0.025 % cream APPLY TO THE AFFECTED AREA(S) AT BEDTIME   [DISCONTINUED] montelukast (SINGULAIR) 10 MG tablet Take 1  tablet (10 mg total) by mouth at bedtime.   [DISCONTINUED] potassium chloride SA (KLOR-CON) 20 MEQ tablet TAKE 1 TABLET BY MOUTH ONCE DAILY   No facility-administered encounter medications on file as of 05/31/2021.     Review of Systems  Review of Systems  No chest pain with exertion.  No orthopnea or PND.  Comprehensive review of systems otherwise negative. Physical Exam  BP 124/68 (BP Location: Left Arm, Patient Position: Sitting, Cuff Size: Normal)    Pulse 63    Temp 97.7 F (36.5 C) (Oral)    Ht 5' 1" (1.549 m)    Wt 189 lb 6.4 oz (85.9 kg)    SpO2 99%    BMI 35.79 kg/m   Wt Readings from Last 5 Encounters:  07/21/21 193 lb (87.5 kg)  06/26/21 190 lb 3.2 oz (86.3 kg)  06/08/21 192 lb 6.4 oz (87.3 kg)  05/31/21 189 lb 6.4 oz (85.9 kg)  05/18/21 192 lb (87.1 kg)    BMI Readings from Last 5 Encounters:  07/21/21 36.47 kg/m  06/26/21 35.94 kg/m  06/08/21 36.35 kg/m  05/31/21 35.79 kg/m  05/18/21 34.28 kg/m     Physical Exam General: Well-appearing, no acute distress Eyes: EOMI, no icterus Neck: Supple, no JVP Pulmonary: Clear, normal work of breathing Cardiovascular: Regular rhythm, no murmur Abdomen: Nondistended, bowel sounds present MSK: No synovitis, joint effusion Neuro: Normal gait, no weakness Psych: Normal mood, full affect   Assessment & Plan:    [name]: Present for many months to years.  Possibly triggered by viral infection.  History of seasonal allergies.  High suspicion for emergent asthma.  PFTs for further evaluation.  Asthma: Clinical diagnosis based on worsening symptoms, dyspnea on exertion, seasonal allergies.  Trial high-dose Advair discus twice daily, new prescription today.  Continue albuterol as needed.  Notably, eosinophils elevated at 360 02/2021.  Tobacco use: Smoking assessment and cessation counseling I have advised the patient to quit/stop smoking as soon as possible due to high risk for multiple medical problems.   It will also be very difficult for [name] to manage patient's  respiratory symptoms and status if we continue to expose her lungs to a known irritant.  We do not advise e-cigarettes as a form of stopping smoking. Patient is willing to quit smoking. I have advised the patient that we can assist and have options of  replacement therapy, provided smoking cessation education today, provided smoking cessation counseling, and provided cessation resources.  After shared decision making, agreed to trial gradual reduction of cigarettes at consistent time intervals. Follow-up next office visit office visit for assessment of smoking cessation.  5 minutes spent in smoking cessation counseling. ° ° °Return in about 3 months (around 08/29/2021). ° ° °Matthew R Hunsucker, MD °08/22/2021 ° ° ° ° °

## 2021-08-24 ENCOUNTER — Encounter: Payer: Self-pay | Admitting: Gastroenterology

## 2021-08-24 NOTE — Telephone Encounter (Signed)
Lm on vm for patient to return call to discuss scheduling an appt with Dr. Havery Moros. ?

## 2021-08-30 ENCOUNTER — Ambulatory Visit (INDEPENDENT_AMBULATORY_CARE_PROVIDER_SITE_OTHER): Payer: 59 | Admitting: Pulmonary Disease

## 2021-08-30 ENCOUNTER — Other Ambulatory Visit: Payer: Self-pay

## 2021-08-30 DIAGNOSIS — R0609 Other forms of dyspnea: Secondary | ICD-10-CM

## 2021-08-30 LAB — PULMONARY FUNCTION TEST
DL/VA % pred: 107 %
DL/VA: 4.67 ml/min/mmHg/L
DLCO cor % pred: 94 %
DLCO cor: 17.74 ml/min/mmHg
DLCO unc % pred: 94 %
DLCO unc: 17.74 ml/min/mmHg
FEF 25-75 Post: 2.77 L/s
FEF 25-75 Pre: 3.17 L/s
FEF2575-%Change-Post: -12 %
FEF2575-%Pred-Post: 135 %
FEF2575-%Pred-Pre: 154 %
FEV1-%Change-Post: -3 %
FEV1-%Pred-Post: 105 %
FEV1-%Pred-Pre: 108 %
FEV1-Post: 2.08 L
FEV1-Pre: 2.14 L
FEV1FVC-%Change-Post: 0 %
FEV1FVC-%Pred-Pre: 111 %
FEV6-%Change-Post: -2 %
FEV6-%Pred-Post: 97 %
FEV6-%Pred-Pre: 100 %
FEV6-Post: 2.35 L
FEV6-Pre: 2.4 L
FEV6FVC-%Pred-Post: 103 %
FEV6FVC-%Pred-Pre: 103 %
FVC-%Change-Post: -2 %
FVC-%Pred-Post: 94 %
FVC-%Pred-Pre: 96 %
FVC-Post: 2.35 L
FVC-Pre: 2.4 L
Post FEV1/FVC ratio: 88 %
Post FEV6/FVC ratio: 100 %
Pre FEV1/FVC ratio: 89 %
Pre FEV6/FVC Ratio: 100 %
RV % pred: 81 %
RV: 1.45 L
TLC % pred: 85 %
TLC: 4.02 L

## 2021-08-30 NOTE — Progress Notes (Signed)
PFT done today. 

## 2021-08-31 ENCOUNTER — Other Ambulatory Visit (INDEPENDENT_AMBULATORY_CARE_PROVIDER_SITE_OTHER): Payer: 59

## 2021-08-31 ENCOUNTER — Ambulatory Visit: Payer: 59 | Admitting: Gastroenterology

## 2021-08-31 ENCOUNTER — Other Ambulatory Visit (HOSPITAL_COMMUNITY): Payer: Self-pay

## 2021-08-31 ENCOUNTER — Encounter: Payer: Self-pay | Admitting: Gastroenterology

## 2021-08-31 VITALS — BP 142/82 | HR 73 | Ht 61.5 in | Wt 192.0 lb

## 2021-08-31 DIAGNOSIS — E785 Hyperlipidemia, unspecified: Secondary | ICD-10-CM | POA: Diagnosis not present

## 2021-08-31 DIAGNOSIS — R140 Abdominal distension (gaseous): Secondary | ICD-10-CM

## 2021-08-31 DIAGNOSIS — R197 Diarrhea, unspecified: Secondary | ICD-10-CM | POA: Diagnosis not present

## 2021-08-31 DIAGNOSIS — E119 Type 2 diabetes mellitus without complications: Secondary | ICD-10-CM | POA: Diagnosis not present

## 2021-08-31 DIAGNOSIS — Z791 Long term (current) use of non-steroidal anti-inflammatories (NSAID): Secondary | ICD-10-CM | POA: Diagnosis not present

## 2021-08-31 DIAGNOSIS — R6881 Early satiety: Secondary | ICD-10-CM | POA: Diagnosis not present

## 2021-08-31 DIAGNOSIS — K5900 Constipation, unspecified: Secondary | ICD-10-CM | POA: Diagnosis not present

## 2021-08-31 DIAGNOSIS — R112 Nausea with vomiting, unspecified: Secondary | ICD-10-CM

## 2021-08-31 DIAGNOSIS — K219 Gastro-esophageal reflux disease without esophagitis: Secondary | ICD-10-CM

## 2021-08-31 LAB — CBC
HCT: 44.6 % (ref 36.0–46.0)
Hemoglobin: 14.5 g/dL (ref 12.0–15.0)
MCHC: 32.6 g/dL (ref 30.0–36.0)
MCV: 83.7 fl (ref 78.0–100.0)
Platelets: 344 10*3/uL (ref 150.0–400.0)
RBC: 5.33 Mil/uL — ABNORMAL HIGH (ref 3.87–5.11)
RDW: 13.8 % (ref 11.5–15.5)
WBC: 7 10*3/uL (ref 4.0–10.5)

## 2021-08-31 LAB — COMPREHENSIVE METABOLIC PANEL
ALT: 54 U/L — ABNORMAL HIGH (ref 0–35)
AST: 28 U/L (ref 0–37)
Albumin: 4.7 g/dL (ref 3.5–5.2)
Alkaline Phosphatase: 113 U/L (ref 39–117)
BUN: 25 mg/dL — ABNORMAL HIGH (ref 6–23)
CO2: 23 mEq/L (ref 19–32)
Calcium: 9.7 mg/dL (ref 8.4–10.5)
Chloride: 105 mEq/L (ref 96–112)
Creatinine, Ser: 1 mg/dL (ref 0.40–1.20)
GFR: 62.81 mL/min (ref >60.00)
Glucose, Bld: 152 mg/dL — ABNORMAL HIGH (ref 70–99)
Potassium: 3.2 mEq/L — ABNORMAL LOW (ref 3.5–5.1)
Sodium: 140 mEq/L (ref 135–145)
Total Bilirubin: 0.4 mg/dL (ref 0.2–1.2)
Total Protein: 7.8 g/dL (ref 6.0–8.3)

## 2021-08-31 LAB — TSH: TSH: 2 u[IU]/mL (ref 0.35–5.50)

## 2021-08-31 LAB — HEMOGLOBIN A1C: Hgb A1c MFr Bld: 7.3 % — ABNORMAL HIGH (ref 4.6–6.5)

## 2021-08-31 LAB — LIPID PANEL
Cholesterol: 110 mg/dL (ref 0–200)
HDL: 52.4 mg/dL (ref >39.00)
LDL Cholesterol: 19 mg/dL (ref 0–99)
NonHDL: 57.49
Total CHOL/HDL Ratio: 2
Triglycerides: 191 mg/dL — ABNORMAL HIGH (ref 0.0–149.0)
VLDL: 38.2 mg/dL (ref 0.0–40.0)

## 2021-08-31 MED ORDER — OMEPRAZOLE 40 MG PO CPDR
40.0000 mg | DELAYED_RELEASE_CAPSULE | Freq: Every day | ORAL | 1 refills | Status: DC
  Filled 2021-08-31: qty 90, 90d supply, fill #0
  Filled 2021-12-20: qty 90, 90d supply, fill #1

## 2021-08-31 MED ORDER — ONDANSETRON 4 MG PO TBDP
4.0000 mg | ORAL_TABLET | Freq: Three times a day (TID) | ORAL | 2 refills | Status: DC
  Filled 2021-08-31: qty 90, 30d supply, fill #0

## 2021-08-31 MED ORDER — HYOSCYAMINE SULFATE 0.125 MG PO TBDP
0.1250 mg | ORAL_TABLET | Freq: Four times a day (QID) | ORAL | 2 refills | Status: DC | PRN
  Filled 2021-08-31: qty 60, 15d supply, fill #0
  Filled 2022-06-19: qty 60, 15d supply, fill #1
  Filled 2022-08-21: qty 60, 15d supply, fill #2

## 2021-08-31 NOTE — Patient Instructions (Addendum)
If you are age 57 or older, your body mass index should be between 23-30. Your Body mass index is 35.69 kg/m?Marland Kitchen If this is out of the aforementioned range listed, please consider follow up with your Primary Care Provider. ? ?If you are age 3 or younger, your body mass index should be between 19-25. Your Body mass index is 35.69 kg/m?Marland Kitchen If this is out of the aformentioned range listed, please consider follow up with your Primary Care Provider.  ? ?________________________________________________________ ? ?The [place] [facility] providers would like to encourage you to use [facility] to communicate with providers for non-urgent requests or questions.  Due to long hold times on the telephone, sending your provider a message by [facility] may be a faster and more efficient way to get a response.  Please allow 48 business hours for a response.  Please remember that this is for non-urgent requests.  ?_______________________________________________________ ? ? ?You have been scheduled for an endoscopy on Thursday, 3-30 but we will add you to the wait list should an earlier appointment should come available. Please follow written instructions given to you at your visit today. ?If you use inhalers (even only as needed), please bring them with you on the day of your procedure.  ? ?Please go to the lab in the basement of our building to have lab work done as you leave today. Hit "B" for basement when you get on the elevator.  When the doors open the lab is on your left.  We will call you with the results. Thank you. ? ?Stop all iron and NSAID use. Use Tylenol as needed. ? ?We have sent the following medications to your pharmacy for you to pick up at your convenience: ?Omeprazole 40 mg once daily ? ?Continue Levsin as needed. ? ?Use Zofran three times every day. ?Use Miralax once a day or every other day. ? ?Thank you for entrusting me with your care and for choosing [employer], ?Dr. Twining Cellar ? ? ? ? ?

## 2021-08-31 NOTE — Progress Notes (Signed)
PFTs are normal. This is good news. Are the inhalers helping SOB?

## 2021-08-31 NOTE — Progress Notes (Signed)
? ?HPI :  ?56 year old female with a history of GERD, diabetes, suspected gastroparesis in the past, history of gallbladder dysfunction status postcholecystectomy, history of hemorrhoids status post banding. ? ?She is here for a follow-up visit in the office today accompanied by her husband, for ongoing upper tract symptoms.  She has had problems with abdominal bloating, nausea, early satiety, stomach discomfort bothering her tremendously. ? ?She states for the past several years she has had bloating intermittently for a long time, however has definitely been worse over the past 3 months or so since December.  She feels that every time she eats solid food her abdomen "blows up".  She has early satiety, and nausea after she eats.  She has some regurgitation and reflux symptoms with pyrosis but no vomiting on a routine basis.  She will perhaps vomit 2 times per week which is usually solid foods.  She has been drinking a lot of protein shakes and staying on liquids to minimize her symptoms.  Despite her poor appetite and not eating well she has not lost any weight.  In regards to her bowel habits she has been constipated at baseline.  She has been taking MiraLAX 1 cap daily however while that treats her constipation she then will have loose stools that can bother her as well.  She was placed on Linzess in the past but does not take it because it is too strong for her.  No blood in her stools.  She shows me pictures of her abdomen which appears quite bloated and distended at times.  This can fluctuate.  She denies any focal pain but more so diffuse distention. ? ?In looking at her medication list she has been taking ibuprofen 400 to 800 mg every day to every other day for back pain which has been ongoing for some time.  She is prescribed Norco but she dates she does not take it at all.  She was also told she had an iron deficiency and has been on iron pills for the past 3 months or so since the symptoms got  worse. ? ?Her hemoglobin A1c is 7.2.  She is on [place] at this time and appears to tolerated okay.  She has been taking Levsin which somewhat helps her cramps at times. ? ?Gave her some Reglan empirically 5 mg 3 times daily, she states it does really help when she takes it and is frustrated with it.  She does take Zofran as needed for severe nausea and that does help when she takes it.  She is frustrated by her symptoms and concerned about what could be causing it. ? ?She last had an EGD with me in January 2022 which showed some gastritis, benign nodule in the duodenum that was biopsied and consistent with peptic duodenitis.  Otherwise looked okay.  She has not been on any PPI prophylaxis while in the setting of ongoing NSAID use.  She had a CBC checked in September of last year her hemoglobin was 14.2.  She had a total iron level of 64, ferritin of 151, her iron sat was slightly low at 17.4%.  She has been on iron she thinks for the past few months.  She had an echocardiogram in October which showed a normal ejection fraction, grade 1 diastolic dysfunction. ?  ? ?Prior work-up: ?EGD 06/2020: ?  ?- Esophagogastric landmarks identified. ?- Normal esophagus otherwise ?- Diffusely granular gastric mucosa with patchy erythema. Biopsied to rule out H pylori. ?- Normal stomach otherwise ?-  Nodule found in the duodenum, suspect could be prominent / normal variant ampulla. ?Biopsied. ?- Normal duodenum otherwise ?  ?1. Surgical [P], duodenal nodule ?- PEPTIC DUODENITIS. ?- NO DYSPLASIA OR MALIGNANCY. ?2. Surgical [P], gastric antrum and gastric body ?- REACTIVE GASTROPATHY. ?- WARTHIN-STARRY IS NEGATIVE FOR HELICOBACTER PYLORI. ?- NO INTESTINAL METAPLASIA, DYSPLASIA, OR MALIGNANCY ?  ?  ?Prior workup: ?RUQ [name] - 05/27/19 - normal gallbladder, no gallstones, fatty liver without obvious cirrhosis ?  ?  ?Colonoscopy 05/03/2017 - The perianal and digital rectal examinations were normal. ?- The terminal ileum appeared normal. ?- A  4 mm polyp was found in the transverse colon. The polyp was flat. The polyp was ?removed with a cold snare. Resection and retrieval were complete. ?- Two sessile polyps were found in the recto-sigmoid colon. The polyps were 3 to 4 mm in ?size. These polyps were removed with a cold snare. Resection and retrieval were complete. ?- A few small-mouthed diverticula were found in the ascending colon. ?- The exam was otherwise without abnormality. ?  ?Surgical [P], transverse and cecum, polyp (3) ?- HYPERPLASTIC POLYP(S). ?- THERE IS NO EVIDENCE OF MALIGNANCY. ?  ?  ?MRI abdomen 11/15/20: IMPRESSION: ?1. Moderate hepatic steatosis with areas of focal fatty sparing ?along the gallbladder fossa which correspond with lesions seen on ?prior ultrasound. No suspicious hepatic lesion. ?2. Benign splenic hemangiomas. ?3. Small bilateral renal cysts. ?  ?HIDA 01/04/21 - EF 11% ? ?Hemorrhoid banding on 02/24/21, 03/15/21, 04/12/21 ? ? ?Echo 03/29/21 - 65-70%, grade I DD ? ?Past Medical History:  ?Diagnosis Date  ? Allergy   ? Arthritis   ? Back pain   ? Carpal tunnel syndrome   ? Diabetes mellitus without complication ([place])   ? DVT (deep venous thrombosis) ([place])   ? after long car trip  ? Dyspnea   ? Gastroparesis   ? GERD (gastroesophageal reflux disease)   ? H. pylori infection   ? 1997  ? Heart murmur   ? Hypertension   ? Hypertriglyceridemia 02/22/2017  ? Hypertriglyceridemia 02/22/2017  ? Migraines   ? Sickle cell trait ([place])   ? Sleep apnea   ? states she no longer has OSA lost weight  ? ? ? ?Past Surgical History:  ?Procedure Laterality Date  ? ABDOMINAL HYSTERECTOMY    ? ovaries left in place  ? CARPAL TUNNEL RELEASE Right 01/19/2019  ? Procedure: ENDOSCOPIC CARPAL TUNNEL RELEASE;  Surgeon: Milly Jakob, MD;  Location: [facility];  Service: Orthopedics;  Laterality: Right;  ? CARPOMETACARPEL SUSPENSION PLASTY Right 01/19/2019  ? Procedure: RIGHT THUMB SUSPENSION ARTHROPLASTY;  Surgeon: Milly Jakob, MD;   Location: [facility];  Service: Orthopedics;  Laterality: Right;  PREOP BLOCK  ? CHOLECYSTECTOMY  01/24/2021  ? TOE SURGERY    ? left great toe, repair of severed tendon  ? TONSILLECTOMY    ? tummy tuck    ? ?Family History  ?Problem Relation Age of Onset  ? Diabetes type II Mother   ? High blood pressure Mother   ? Diabetes Mellitus II Father   ? Pulmonary fibrosis Father   ? Hypertension Father   ? Pulmonary disease Father   ? High Cholesterol Father   ? Hypertension Sister   ? Diabetes Sister   ? Stomach cancer Maternal Grandmother   ?     possibility that this was colon cancer instead-patient and her mother have differing recollection  ? Cancer Maternal Grandfather   ?  lung, smoker  ? Allergic Disorder Son   ? Hyperthyroidism Son   ? Allergic Disorder Son   ? Asthma Son   ? Esophageal cancer Neg Hx   ? Pancreatic cancer Neg Hx   ? Rectal cancer Neg Hx   ? Colon cancer Neg Hx   ? ?Social History  ? ?Tobacco Use  ? Smoking status: Never  ? Smokeless tobacco: Never  ?Vaping Use  ? Vaping Use: Never used  ?Substance Use Topics  ? Alcohol use: Never  ?  Comment: never  ? Drug use: No  ? ?Current Outpatient Medications  ?Medication Sig Dispense Refill  ? albuterol (VENTOLIN HFA) 108 (90 Base) MCG/ACT inhaler Inhale 2 puffs into the lungs every 6 (six) hours as needed for wheezing or shortness of breath. 54 g 1  ? aluminum-magnesium hydroxide-simethicone (MAALOX) 588-502-77 MG/5ML SUSP Take 30 mLs by mouth 3 (three) times daily with meals as needed. 355 mL 0  ? amLODipine (NORVASC) 5 MG tablet Take 1 tablet (5 mg total) by mouth daily. 90 tablet 1  ? Ascorbic Acid (VITAMIN C PO) Take 1 capsule by mouth daily.    ? atorvastatin (LIPITOR) 10 MG tablet Take 1 tablet (10 mg total) by mouth daily. 90 tablet 1  ? Blood Glucose Monitoring Suppl (FREESTYLE LITE) w/Device KIT Use as directed to test blood sugar once a day 1 kit 0  ? carvedilol (COREG) 25 MG tablet TAKE 1 TABLET BY MOUTH 2 TIMES DAILY WITH A  MEAL. 180 tablet 1  ? cetirizine (ZYRTEC) 10 MG tablet Take 1 tablet (10 mg total) by mouth daily. 90 tablet 1  ? cholecalciferol (VITAMIN D3) 25 MCG (1000 UNIT) tablet Take 1,000 Units by mouth daily.    ? Continuo

## 2021-09-06 ENCOUNTER — Telehealth: Payer: Self-pay | Admitting: Family Medicine

## 2021-09-06 ENCOUNTER — Other Ambulatory Visit: Payer: Self-pay

## 2021-09-06 DIAGNOSIS — G8929 Other chronic pain: Secondary | ICD-10-CM

## 2021-09-06 MED ORDER — RIFAXIMIN 550 MG PO TABS: 550.0000 mg | ORAL_TABLET | Freq: Three times a day (TID) | ORAL | 0 refills | Status: DC

## 2021-09-06 NOTE — Telephone Encounter (Signed)
Talked to pt, she has upcoming appt. Referral entered.  ?

## 2021-09-06 NOTE — Telephone Encounter (Signed)
Pt's husband stated she needs a referral to [facility] in [place]. Advised him pt will need an appt with our office first, since last referral was a while ago. He stated he did not think that would be the case and to check with pcp. Please advise.  ?

## 2021-09-07 ENCOUNTER — Telehealth: Payer: Self-pay

## 2021-09-07 ENCOUNTER — Other Ambulatory Visit (HOSPITAL_COMMUNITY): Payer: Self-pay

## 2021-09-07 NOTE — Telephone Encounter (Signed)
Called and LM for patient that she can pick up a sample of Xifaxan at our office M - F,  7:45am to 4:45pm. She is to take 1 tablet three times a day for 14 days ?

## 2021-09-12 ENCOUNTER — Encounter: Payer: Self-pay | Admitting: Pulmonary Disease

## 2021-09-12 ENCOUNTER — Ambulatory Visit: Payer: 59 | Admitting: Pulmonary Disease

## 2021-09-12 ENCOUNTER — Other Ambulatory Visit: Payer: Self-pay

## 2021-09-12 VITALS — BP 124/74 | HR 64 | Temp 98.4°F | Ht 61.0 in | Wt 193.8 lb

## 2021-09-12 DIAGNOSIS — T7840XD Allergy, unspecified, subsequent encounter: Secondary | ICD-10-CM

## 2021-09-12 MED ORDER — BREZTRI AEROSPHERE 160-9-4.8 MCG/ACT IN AERO: 2.0000 | INHALATION_SPRAY | Freq: Two times a day (BID) | RESPIRATORY_TRACT | 0 refills | Status: AC

## 2021-09-12 NOTE — Patient Instructions (Signed)
Nice to see you again ? ?Your pulmonary function test are all normal, this is good news ? ?Use albuterol right before you leave your house as I do think this will help with some of the shortness of breath walking into work, continue to use it before your Zumba classes, this is likely why you are able to do this exercise without more shortness of breath ? ?Stop Advair, use Breztri 2 puffs twice a day every day.  Rinse your mouth out with water after every use.  This is a bit stronger in the different delivery system to see if this helps with your symptoms.  If it does help, please call me and I will prescribe it.  It may be a bit more expensive so we may need to consider asking the manufacturer to help [name] with this, we will navigate that cost process in the future if needed. ? ?Return to clinic in 3 months or sooner as needed with Dr. Silas Flood ?

## 2021-09-13 ENCOUNTER — Telehealth: Payer: Self-pay

## 2021-09-13 DIAGNOSIS — E119 Type 2 diabetes mellitus without complications: Secondary | ICD-10-CM | POA: Diagnosis not present

## 2021-09-13 NOTE — Progress Notes (Signed)
? ?@Patient  ID: Monique Wiggins, female    DOB: [birth year], 56 y.o.   MRN: [id number] ? ?Chief Complaint  ?Patient presents with  ? Follow-up  ?  Pt is here for follow up. Pt states her DOE is doing ok. She still get s winded walking long distances.   ? ? ?Referring provider: ?Mosie Lukes, MD ? ?HPI:  ? ?56 y.o. whom are seen in follow up for evaluation of dyspnea on exertion.  Recent GI note reviewed. ? ?She returns for follow-up.  Notes that some dyspnea better, some dyspnea unchanged.  She is able to do Zumba classes without much issue.  She does note that when she walks and from work which is quite a distance from the parking deck she is quite winded when she gets inside the building.  After some questioning it turns out she is using albuterol prior to Zumba.  She does state occasionally she removes use albuterol prior to work but her walking to work is not as dyspneic.  She continues on Advair as prescribed. ? ?Recently she had PFTs performed.  These were reviewed in detail with the patient.  These are normal as discussed below.  She was updated on these results. ? ?HPI initial visit: ?Patient notes dyspnea exertion for a while now.  Started maybe couple years ago.  Worse over the last several months.  Worse on inclines or stairs.  No time of day when things are better or worse.  No position makes things better or worse.  No seasonal environmental factors she can identify.  Possibly triggered by viral infection back in 2020.  Use albuterol as needed.  This does seem to help.  She endorses a history of seasonal allergies.  Takes montelukast and Zyrtec.  These seem to help in terms of nasal congestion. ? ?Reviewed most recent chest x-ray 08/2020 that reveals clear lungs bilaterally.  Reviewed TTE obtained in the setting of work-up for dyspnea on exertion that demonstrates no significant abnormalities. ? ?PMH: Seasonal allergies, diabetes, hypertension ?Surgical history: Hysterectomy, carpal tunnel,  cholecystectomy, toe surgery ?Family history: Mother with diabetes, hypertension, father with diabetes, hypertension, pulmonary fibrosis ?Social history: Current smoker, lives in [place] ? ?Questionaires / Pulmonary Flowsheets:  ? ?ACT:  ?   ? View : No data to display.  ?  ?  ?  ? ? ?MMRC: ?mMRC Dyspnea Scale mMRC Score  ?05/31/2021 ? 3:55 PM 1  ? ? ?Epworth:  ?   ? View : No data to display.  ?  ?  ?  ? ? ?Tests:  ? ?FENO:  ?No results found for: NITRICOXIDE ? ?PFT: ? ?  Latest Ref Rng & Units 08/30/2021  ?  2:54 PM  ?PFT Results  ?FVC-Pre L 2.40    ?FVC-Predicted Pre % 96    ?FVC-Post L 2.35    ?FVC-Predicted Post % 94    ?Pre FEV1/FVC % % 89    ?Post FEV1/FCV % % 88    ?FEV1-Pre L 2.14    ?FEV1-Predicted Pre % 108    ?FEV1-Post L 2.08    ?DLCO uncorrected ml/min/mmHg 17.74    ?DLCO UNC% % 94    ?DLCO corrected ml/min/mmHg 17.74    ?DLCO COR %Predicted % 94    ?DLVA Predicted % 107    ?TLC L 4.02    ?TLC % Predicted % 85    ?RV % Predicted % 81    ?Personally reviewed and interpreted as normal spirometry, no bronchodilator response, lung  volumes within normal limits, DLCO within normal limits. ?WALK:  ?   ? View : No data to display.  ?  ?  ?  ? ? ?Imaging: ?Personally reviewed and as per EMR discussion this note ? ?Lab Results: ?Personally reviewed ?CBC ?   ?Component Value Date/Time  ? WBC 7.0 08/31/2021 0911  ? RBC 5.33 (H) 08/31/2021 0911  ? HGB 14.5 08/31/2021 0911  ? HCT 44.6 08/31/2021 0911  ? PLT 344.0 08/31/2021 0911  ? MCV 83.7 08/31/2021 0911  ? MCH 26.3 (L) 02/21/2021 1609  ? MCHC 32.6 08/31/2021 0911  ? RDW 13.8 08/31/2021 0911  ? LYMPHSABS 2,348 02/21/2021 1609  ? MONOABS 0.5 11/10/2020 1348  ? EOSABS 361 02/21/2021 1609  ? BASOSABS 60 02/21/2021 1609  ? ? ?BMET ?   ?Component Value Date/Time  ? NA 140 08/31/2021 0911  ? NA 137 12/01/2018 0000  ? K 3.2 (L) 08/31/2021 0911  ? CL 105 08/31/2021 0911  ? CO2 23 08/31/2021 0911  ? GLUCOSE 152 (H) 08/31/2021 0911  ? BUN 25 (H) 08/31/2021 0911  ? BUN 17  12/01/2018 0000  ? CREATININE 1.00 08/31/2021 0911  ? CALCIUM 9.7 08/31/2021 0911  ? GFRNONAA >60 01/23/2021 0923  ? GFRAA >60 01/15/2019 1026  ? ? ?BNP ?No results found for: BNP ? ?ProBNP ?No results found for: PROBNP ? ?Specialty Problems   ? ?  ? Pulmonary Problems  ? Dyspnea  ? ? ?Allergies  ?Allergen Reactions  ? Pollen Extract Swelling  ? Shellfish Allergy Swelling  ? ? ?Immunization History  ?Administered Date(s) Administered  ? Hepatitis A, Adult 07/25/2020  ? Influenza Inj Mdck Quad Pf 04/04/2017  ? Influenza,inj,Quad PF,6+ Mos 03/03/2018, 02/12/2019, 02/21/2021  ? Influenza-Unspecified 09/29/2016, 04/11/2017  ? PFIZER(Purple Top)SARS-COV-2 Vaccination 06/17/2019, 07/07/2019, 04/09/2020, 01/11/2021  ? PNEUMOCOCCAL CONJUGATE-20 06/08/2021  ? Pneumococcal Polysaccharide-23 12/05/2017  ? Td 02/16/2014  ? Zoster Recombinat (Shingrix) 10/01/2020, 02/21/2021  ? ? ?Past Medical History:  ?Diagnosis Date  ? Allergy   ? Arthritis   ? Back pain   ? Carpal tunnel syndrome   ? Diabetes mellitus without complication ([place])   ? DVT (deep venous thrombosis) ([place])   ? after long car trip  ? Dyspnea   ? Gastroparesis   ? GERD (gastroesophageal reflux disease)   ? H. pylori infection   ? 1997  ? Heart murmur   ? Hypertension   ? Hypertriglyceridemia 02/22/2017  ? Hypertriglyceridemia 02/22/2017  ? Migraines   ? Sickle cell trait ([place])   ? Sleep apnea   ? states she no longer has OSA lost weight  ? ? ?Tobacco History: ?Social History  ? ?Tobacco Use  ?Smoking Status Never  ?Smokeless Tobacco Never  ? ?Counseling given: Not Answered ? ? ?Continue to not smoke ? ?Outpatient Encounter Medications as of 09/12/2021  ?Medication Sig  ? albuterol (VENTOLIN HFA) 108 (90 Base) MCG/ACT inhaler Inhale 2 puffs into the lungs every 6 (six) hours as needed for wheezing or shortness of breath.  ? aluminum-magnesium hydroxide-simethicone (MAALOX) 903-009-23 MG/5ML SUSP Take 30 mLs by mouth 3 (three) times daily with meals as needed.  ?  amLODipine (NORVASC) 5 MG tablet Take 1 tablet (5 mg total) by mouth daily.  ? Ascorbic Acid (VITAMIN C PO) Take 1 capsule by mouth daily.  ? atorvastatin (LIPITOR) 10 MG tablet Take 1 tablet (10 mg total) by mouth daily.  ? Blood Glucose Monitoring Suppl (FREESTYLE LITE) w/Device KIT Use as directed to test blood sugar  once a day  ? Budeson-Glycopyrrol-Formoterol (BREZTRI AEROSPHERE) 160-9-4.8 MCG/ACT AERO Inhale 2 puffs into the lungs in the morning and at bedtime.  ? carvedilol (COREG) 25 MG tablet TAKE 1 TABLET BY MOUTH 2 TIMES DAILY WITH A MEAL.  ? cetirizine (ZYRTEC) 10 MG tablet Take 1 tablet (10 mg total) by mouth daily.  ? cholecalciferol (VITAMIN D3) 25 MCG (1000 UNIT) tablet Take 1,000 Units by mouth daily.  ? Continuous Blood Gluc Receiver (DEXCOM G6 RECEIVER) DEVI Use as directed to check blood sugars  ? Continuous Blood Gluc Sensor (DEXCOM G6 SENSOR) MISC Check blood sugars as directed  ? Continuous Blood Gluc Transmit (DEXCOM G6 TRANSMITTER) MISC Check blood sugars as directed  ? Cyanocobalamin (B-12 SL) Place 1 capsule under the tongue daily.  ? dapagliflozin propanediol (FARXIGA) 10 MG TABS tablet Take 1 tablet (10 mg total) by mouth daily.  ? fluticasone (FLONASE) 50 MCG/ACT nasal spray Place 2 sprays into both nostrils daily as needed for allergies.  ? fluticasone-salmeterol (ADVAIR DISKUS) 500-50 MCG/ACT AEPB Inhale 1 puff into the lungs in the morning and at bedtime.  ? gabapentin (NEURONTIN) 300 MG capsule Take 1 capsule (300 mg total) by mouth at bedtime.  ? glipiZIDE (GLUCOTROL) 5 MG tablet Take 1/2 tablet (2.5 mg total) by mouth daily before breakfast.  ? glucose blood test strip Use as directed to test blood sugar daily in the afternoon  ? hydrochlorothiazide (HYDRODIURIL) 25 MG tablet Take 1 tablet (25 mg total) by mouth daily.  ? HYDROcodone-acetaminophen (NORCO/VICODIN) 5-325 MG tablet Take 1 tablet by mouth every 6 (six) hours as needed for moderate pain.  ? hyoscyamine (ANASPAZ) 0.125  MG TBDP disintergrating tablet Place 1 tablet (0.125 mg total) under the tongue every 6 (six) hours as needed for cramping.  ? Lancets (FREESTYLE) lancets Use as directed to test once a day  ? linaclotide [facility]

## 2021-09-13 NOTE — Telephone Encounter (Signed)
Per MD request called patient to offer 10 am time slot for 3/30 procedure.  Patient accepted new time.  New instructions sent via MyChart per patient request. ?

## 2021-09-14 ENCOUNTER — Encounter: Payer: Self-pay | Admitting: Gastroenterology

## 2021-09-14 ENCOUNTER — Ambulatory Visit: Payer: 59 | Admitting: Dietician

## 2021-09-14 ENCOUNTER — Ambulatory Visit (AMBULATORY_SURGERY_CENTER): Payer: 59 | Admitting: Gastroenterology

## 2021-09-14 VITALS — BP 139/76 | HR 62 | Temp 97.5°F | Resp 14 | Ht 61.0 in | Wt 192.0 lb

## 2021-09-14 DIAGNOSIS — R6881 Early satiety: Secondary | ICD-10-CM

## 2021-09-14 DIAGNOSIS — K219 Gastro-esophageal reflux disease without esophagitis: Secondary | ICD-10-CM

## 2021-09-14 DIAGNOSIS — K2950 Unspecified chronic gastritis without bleeding: Secondary | ICD-10-CM

## 2021-09-14 DIAGNOSIS — K31A0 Gastric intestinal metaplasia, unspecified: Secondary | ICD-10-CM

## 2021-09-14 DIAGNOSIS — K2980 Duodenitis without bleeding: Secondary | ICD-10-CM | POA: Diagnosis not present

## 2021-09-14 DIAGNOSIS — K2970 Gastritis, unspecified, without bleeding: Secondary | ICD-10-CM | POA: Diagnosis not present

## 2021-09-14 DIAGNOSIS — R112 Nausea with vomiting, unspecified: Secondary | ICD-10-CM

## 2021-09-14 DIAGNOSIS — R1013 Epigastric pain: Secondary | ICD-10-CM

## 2021-09-14 DIAGNOSIS — R140 Abdominal distension (gaseous): Secondary | ICD-10-CM

## 2021-09-14 MED ORDER — SODIUM CHLORIDE 0.9 % IV SOLN: 500.0000 mL | INTRAVENOUS | Status: DC

## 2021-09-14 NOTE — Progress Notes (Signed)
Called to room to assist during endoscopic procedure.  Patient ID and intended procedure confirmed with present staff. Received instructions for my participation in the procedure from the performing physician.  

## 2021-09-14 NOTE — Patient Instructions (Signed)
Resume previous diet and medications. Awaiting pathology results. ? ?YOU HAD AN ENDOSCOPIC PROCEDURE TODAY AT [facility]:   Refer to the procedure report that was given to you for any specific questions about what was found during the examination.  If the procedure report does not answer your questions, please call your gastroenterologist to clarify.  If you requested that your care partner not be given the details of your procedure findings, then the procedure report has been included in a sealed envelope for you to review at your convenience later. ? ?YOU SHOULD EXPECT: Some feelings of bloating in the abdomen. Passage of more gas than usual.  Walking can help get rid of the air that was put into your GI tract during the procedure and reduce the bloating. If you had a lower endoscopy (such as a colonoscopy or flexible sigmoidoscopy) you may notice spotting of blood in your stool or on the toilet paper. If you underwent a bowel prep for your procedure, you may not have a normal bowel movement for a few days. ? ?Please Note:  You might notice some irritation and congestion in your nose or some drainage.  This is from the oxygen used during your procedure.  There is no need for concern and it should clear up in a day or so. ? ?SYMPTOMS TO REPORT IMMEDIATELY: ? ? ?Following upper endoscopy (EGD) ? Vomiting of blood or coffee ground material ? New chest pain or pain under the shoulder blades ? Painful or persistently difficult swallowing ? New shortness of breath ? Fever of 100?F or higher ? Black, tarry-looking stools ? ?For urgent or emergent issues, a gastroenterologist can be reached at any hour by calling [phone]. ?Do not use MyChart messaging for urgent concerns.  ? ? ?DIET:  We do recommend a small meal at first, but then you may proceed to your regular diet.  Drink plenty of fluids but you should avoid alcoholic beverages for 24 hours. ? ?ACTIVITY:  You should plan to take it easy for  the rest of today and you should NOT DRIVE or use heavy machinery until tomorrow (because of the sedation medicines used during the test).   ? ?FOLLOW UP: ?Our staff will call the number listed on your records 48-72 hours following your procedure to check on you and address any questions or concerns that you may have regarding the information given to you following your procedure. If we do not reach you, we will leave a message.  We will attempt to reach you two times.  During this call, we will ask if you have developed any symptoms of COVID 19. If you develop any symptoms (ie: fever, flu-like symptoms, shortness of breath, cough etc.) before then, please call [phone].  If you test positive for Covid 19 in the 2 weeks post procedure, please call and report this information to [name].   ? ?If any biopsies were taken you will be contacted by phone or by letter within the next 1-3 weeks.  Please call us at [phone] if you have not heard about the biopsies in 3 weeks.  ? ? ?SIGNATURES/CONFIDENTIALITY: ?You and/or your care partner have signed paperwork which will be entered into your electronic medical record.  These signatures attest to the fact that that the information above on your After Visit Summary has been reviewed and is understood.  Full responsibility of the confidentiality of this discharge information lies with you and/or your care-partner.  ?

## 2021-09-14 NOTE — Progress Notes (Signed)
Pt's states no medical or surgical changes since previsit or office visit. 

## 2021-09-14 NOTE — Progress Notes (Signed)
History and Physical Interval Note: Seen on 08/31/21 - no interval changes. Stopped iron and NSAIDs - feeling somewhat better since office visit. On omeprazole. EGD to further evaluate - discussed risks / benefits and she wants to proceed. ? ?09/14/2021 ?10:31 AM ? ?Monique Wiggins  has presented today for endoscopic procedure(s), with the diagnosis of  ?Encounter Diagnoses  ?Name Primary?  ? Early satiety Yes  ? Bloating   ? Nausea and vomiting, unspecified vomiting type   ? Gastroesophageal reflux disease, unspecified whether esophagitis present   ? Abdominal pain, epigastric   ?Marland Kitchen  The various methods of evaluation and treatment have been discussed with the patient and/or family. After consideration of risks, benefits and other options for treatment, the patient has consented to  the endoscopic procedure(s). ? ? The patient's history has been reviewed, patient examined, no change in status, stable for surgery.  I have reviewed the patient's chart and labs.  Questions were answered to the patient's satisfaction.   ? ?[name], MD ?[facility] ? ?

## 2021-09-14 NOTE — Progress Notes (Signed)
Pt non-responsive, VVS, Report to RN  °

## 2021-09-14 NOTE — Op Note (Signed)
[facility] ?Patient Name: Monique Wiggins ?Procedure Date: 09/14/2021 10:17 AM ?MRN: [id number] ?Endoscopist: Carlota Raspberry. Havery Moros , MD ?Age: 56 ?Referring MD:  ?Date of Birth: Jul 20, 1965 ?Gender: Female ?Account #: [id number] ?Procedure:                Upper GI endoscopy ?Indications:              Epigastric abdominal pain / dyspepsia, history of  ?                          gastro-esophageal reflux disease, Early satiety,  ?                          Nausea with vomiting - history of DM - Reglan has  ?                          not helped, started on omeprazole, held NSAIDs and  ?                          iron since clinic visit and somewhat improved but  ?                          not resolved ?Medicines:                Monitored Anesthesia Care ?Procedure:                Pre-Anesthesia Assessment: ?                          - Prior to the procedure, a History and Physical  ?                          was performed, and patient medications and  ?                          allergies were reviewed. The patient's tolerance of  ?                          previous anesthesia was also reviewed. The risks  ?                          and benefits of the procedure and the sedation  ?                          options and risks were discussed with the patient.  ?                          All questions were answered, and informed consent  ?                          was obtained. Prior Anticoagulants: The patient has  ?                          taken no previous anticoagulant or antiplatelet  ?  agents. ASA Grade Assessment: III - A patient with  ?                          severe systemic disease. After reviewing the risks  ?                          and benefits, the patient was deemed in  ?                          satisfactory condition to undergo the procedure. ?                          After obtaining informed consent, the endoscope was  ?                          passed under direct vision.  Throughout the  ?                          procedure, the patient's blood pressure, pulse, and  ?                          oxygen saturations were monitored continuously. The  ?                          Endoscope was introduced through the mouth, and  ?                          advanced to the second part of duodenum. The upper  ?                          GI endoscopy was accomplished without difficulty.  ?                          The patient tolerated the procedure well. ?Scope In: ?Scope Out: ?Findings:                 Esophagogastric landmarks were identified: the  ?                          Z-line was found at 36 cm, the gastroesophageal  ?                          junction was found at 36 cm and the upper extent of  ?                          the gastric folds was found at 37 cm from the  ?                          incisors. ?                          A 1 cm hiatal hernia was present. ?  The exam of the esophagus was otherwise normal. ?                          Patchy mildly erythematous mucosa with snakeskin  ?                          like appearance was found in the proximal examined  ?                          stomach. ?                          The exam of the stomach was otherwise normal.  ?                          Prominent fold near pylorus. ?                          Biopsies were taken with a cold forceps in the  ?                          gastric body, at the incisura and in the gastric  ?                          antrum for Helicobacter pylori testing. ?                          A single nodule was found in the second portion of  ?                          the duodenum. Similar in appearance to last exam.  ?                          May be the ampulla, normal variant, but hard to  ?                          clearly see. Biopsies were taken with a cold  ?                          forceps for histology. ?                          The exam of the duodenum was otherwise  normal. ?Complications:            No immediate complications. Estimated blood loss:  ?                          Minimal. ?Estimated Blood Loss:     Estimated blood loss was minimal. ?Impression:               - Esophagogastric landmarks identified. ?                          - 1 cm hiatal hernia. ?                          -  Normal esophagus otherwise ?                          - Erythematous mucosa in the proximal stomach as  ?                          described. ?                          - Normal stomach otherwise - biopsies taken to rule  ?                          out H pylori ?                          - Nodule found in the duodenum, may be normal  ?                          variant ampulla (enlarged). Biopsied. ?                          - Normal duodenum otherwise ?                          No significant pathology on this exam to account  ?                          for the patient's symptoms. Will await biopsy  ?                          results, likely functional disorder, perhaps  ?                          gastroparesis. Reglan has not helped. Will need  ?                          gastric emptying study to assess more formally for  ?                          gastroparesis if symptoms persist (need  ?                          gastroparesis confirmed if alternative therapies  ?                          are needed such as domperidone given failure of  ?                          Reglan so far) ?Recommendation:           - Patient has a contact number available for  ?                          emergencies. The signs and symptoms of potential  ?  delayed complications were discussed with the  ?                          patient. Return to normal activities tomorrow.  ?                          Written discharge instructions were provided to the  ?                          patient. ?                          - Resume previous diet. ?                          - Continue present  medications. ?                          - Await pathology results with further  ?                          recommendations ?Remo Lipps P. Havery Moros, MD ?09/14/2021 10:59:33 AM ?This report has been signed electronically. ?

## 2021-09-18 ENCOUNTER — Other Ambulatory Visit (HOSPITAL_COMMUNITY): Payer: Self-pay

## 2021-09-18 ENCOUNTER — Telehealth: Payer: Self-pay | Admitting: *Deleted

## 2021-09-18 ENCOUNTER — Telehealth: Payer: Self-pay

## 2021-09-18 NOTE — Telephone Encounter (Signed)
?  Follow up Call- ? ? ?  09/14/2021  ?  9:13 AM 07/13/2020  ? 11:00 AM  ?Call back number  ?Post procedure Call Back phone  # [phone]  ?Permission to leave phone message Yes Yes  ?  ? ?Patient questions: ? ?Do you have a fever, pain , or abdominal swelling? No. ?Pain Score  0 * ? ?Have you tolerated food without any problems? Yes.   ? ?Have you been able to return to your normal activities? Yes.   ? ?Do you have any questions about your discharge instructions: ?Diet   No. ?Medications  No. ?Follow up visit  No. ? ?Do you have questions or concerns about your Care? No. ? ?Actions: ?* If pain score is 4 or above: ?No action needed, pain <4. ? ? ? ?

## 2021-09-18 NOTE — Telephone Encounter (Signed)
?  Follow up Call-Left message on follow up call.   ?

## 2021-09-19 ENCOUNTER — Other Ambulatory Visit (HOSPITAL_COMMUNITY): Payer: Self-pay

## 2021-09-20 ENCOUNTER — Other Ambulatory Visit (HOSPITAL_COMMUNITY): Payer: Self-pay

## 2021-09-20 ENCOUNTER — Ambulatory Visit: Payer: 59 | Admitting: Sports Medicine

## 2021-09-25 ENCOUNTER — Other Ambulatory Visit: Payer: Self-pay

## 2021-09-25 DIAGNOSIS — R6881 Early satiety: Secondary | ICD-10-CM

## 2021-09-25 DIAGNOSIS — R140 Abdominal distension (gaseous): Secondary | ICD-10-CM

## 2021-09-25 DIAGNOSIS — R112 Nausea with vomiting, unspecified: Secondary | ICD-10-CM

## 2021-09-25 DIAGNOSIS — K219 Gastro-esophageal reflux disease without esophagitis: Secondary | ICD-10-CM

## 2021-09-25 DIAGNOSIS — R1013 Epigastric pain: Secondary | ICD-10-CM

## 2021-10-04 DIAGNOSIS — E119 Type 2 diabetes mellitus without complications: Secondary | ICD-10-CM | POA: Diagnosis not present

## 2021-10-04 DIAGNOSIS — H04123 Dry eye syndrome of bilateral lacrimal glands: Secondary | ICD-10-CM | POA: Diagnosis not present

## 2021-10-04 DIAGNOSIS — H25813 Combined forms of age-related cataract, bilateral: Secondary | ICD-10-CM | POA: Diagnosis not present

## 2021-10-04 DIAGNOSIS — H40053 Ocular hypertension, bilateral: Secondary | ICD-10-CM | POA: Diagnosis not present

## 2021-10-05 ENCOUNTER — Other Ambulatory Visit (HOSPITAL_COMMUNITY): Payer: Self-pay

## 2021-10-06 ENCOUNTER — Encounter: Payer: Self-pay | Admitting: Gastroenterology

## 2021-10-13 ENCOUNTER — Ambulatory Visit: Payer: 59 | Admitting: Dietician

## 2021-10-13 DIAGNOSIS — E119 Type 2 diabetes mellitus without complications: Secondary | ICD-10-CM | POA: Diagnosis not present

## 2021-10-16 ENCOUNTER — Other Ambulatory Visit: Payer: Self-pay | Admitting: Family Medicine

## 2021-10-16 ENCOUNTER — Other Ambulatory Visit (HOSPITAL_COMMUNITY): Payer: Self-pay

## 2021-10-16 MED ORDER — DEXCOM G6 TRANSMITTER MISC
0 refills | Status: DC
  Filled 2021-10-16: qty 1, 90d supply, fill #0

## 2021-10-17 ENCOUNTER — Other Ambulatory Visit (HOSPITAL_COMMUNITY): Payer: Self-pay

## 2021-10-18 ENCOUNTER — Ambulatory Visit (HOSPITAL_COMMUNITY)
Admission: RE | Admit: 2021-10-18 | Discharge: 2021-10-18 | Disposition: A | Discharge status: Home / Self Care | Payer: 59 | Source: Ambulatory Visit | Attending: Gastroenterology | Admitting: Gastroenterology

## 2021-10-18 DIAGNOSIS — K219 Gastro-esophageal reflux disease without esophagitis: Secondary | ICD-10-CM | POA: Diagnosis not present

## 2021-10-18 DIAGNOSIS — R1013 Epigastric pain: Secondary | ICD-10-CM | POA: Diagnosis not present

## 2021-10-18 DIAGNOSIS — R6881 Early satiety: Secondary | ICD-10-CM | POA: Insufficient documentation

## 2021-10-18 DIAGNOSIS — R140 Abdominal distension (gaseous): Secondary | ICD-10-CM | POA: Diagnosis not present

## 2021-10-18 DIAGNOSIS — Z0389 Encounter for observation for other suspected diseases and conditions ruled out: Secondary | ICD-10-CM | POA: Diagnosis not present

## 2021-10-18 DIAGNOSIS — R112 Nausea with vomiting, unspecified: Secondary | ICD-10-CM | POA: Insufficient documentation

## 2021-10-18 MED ORDER — TECHNETIUM TC 99M SULFUR COLLOID
2.1800 | Freq: Once | INTRAVENOUS | Status: AC
  Administered 2021-10-18: 2.18 via ORAL

## 2021-10-25 ENCOUNTER — Other Ambulatory Visit (HOSPITAL_COMMUNITY): Payer: Self-pay

## 2021-10-25 ENCOUNTER — Ambulatory Visit: Payer: 59 | Admitting: Gastroenterology

## 2021-10-25 ENCOUNTER — Telehealth: Payer: Self-pay

## 2021-10-25 ENCOUNTER — Encounter: Payer: Self-pay | Admitting: Gastroenterology

## 2021-10-25 VITALS — BP 142/72 | HR 72 | Ht 61.75 in | Wt 193.4 lb

## 2021-10-25 DIAGNOSIS — R140 Abdominal distension (gaseous): Secondary | ICD-10-CM | POA: Diagnosis not present

## 2021-10-25 DIAGNOSIS — K760 Fatty (change of) liver, not elsewhere classified: Secondary | ICD-10-CM | POA: Diagnosis not present

## 2021-10-25 DIAGNOSIS — K219 Gastro-esophageal reflux disease without esophagitis: Secondary | ICD-10-CM | POA: Diagnosis not present

## 2021-10-25 DIAGNOSIS — K5900 Constipation, unspecified: Secondary | ICD-10-CM

## 2021-10-25 DIAGNOSIS — K30 Functional dyspepsia: Secondary | ICD-10-CM

## 2021-10-25 DIAGNOSIS — F411 Generalized anxiety disorder: Secondary | ICD-10-CM

## 2021-10-25 DIAGNOSIS — Z23 Encounter for immunization: Secondary | ICD-10-CM

## 2021-10-25 DIAGNOSIS — E669 Obesity, unspecified: Secondary | ICD-10-CM

## 2021-10-25 MED ORDER — DULOXETINE HCL 30 MG PO CPEP
ORAL_CAPSULE | ORAL | 1 refills | Status: DC
  Filled 2021-10-25: qty 180, 90d supply, fill #0
  Filled 2022-02-13: qty 180, 90d supply, fill #1

## 2021-10-25 NOTE — Patient Instructions (Addendum)
If you are age 57 or older, your body mass index should be between 23-30. Your Body mass index is 35.66 kg/m?Marland Kitchen If this is out of the aforementioned range listed, please consider follow up with your Primary Care Provider. ? ?If you are age 50 or younger, your body mass index should be between 19-25. Your Body mass index is 35.66 kg/m?Marland Kitchen If this is out of the aformentioned range listed, please consider follow up with your Primary Care Provider.  ? ?________________________________________________________ ? ?The [facility] providers would like to encourage you to use [facility] to communicate with providers for non-urgent requests or questions.  Due to long hold times on the telephone, sending your provider a message by [facility] may be a faster and more efficient way to get a response.  Please allow 48 business hours for a response.  Please remember that this is for non-urgent requests.  ?__________________________________________________ ? ?We have sent the following medications to your pharmacy for you to pick up at your convenience: ?Cymbalta '30mg'$ : Take one capsule daily for 2 weeks and then increase to 2 tablets daily thereafter. ? ?Continue omeprazole and Levsin as needed. ? ?Continue Miralax. We are giving you samples and a coupon today. ? ?We are giving you the first of two Hepatitis A vaccines today.  You will be due for your second shot in 6 months.  We will remind you when it is time to schedule that appointment. ? ?We are referring you to Dr. Johnette Abraham at [facility].  They will contact you directly to schedule an appointment.  It may take a week or more before you hear from them.  Please feel free to contact us if you have not heard from them within 2 weeks and we will follow up on the referral.  ? ?Thank you for entrusting me with your care and for choosing [employer], ?Dr. Fleming Cellar ? ? ? ? ? ? ? ? ?

## 2021-10-25 NOTE — Progress Notes (Signed)
? ?HPI :  ?56 year old female with a history of GERD,  history of gallbladder dysfunction status postcholecystectomy, history of hemorrhoids status post banding, here for follow-up visit. ? ?Recall that I saw her on March 16.  At that time she had a lot of problems with abdominal bloating, nausea, early satiety and abdominal discomfort.  Symptoms of bloating intermittently for a very long time but recent months symptoms had been worse.  A lot of postprandial symptoms with early satiety, nausea, reflux and regurgitation.  Occasional vomiting.  She is also had a history of constipation and taking MiraLAX daily. ? ?Recall that Reglan in the past has not really helped her.  Zofran helps for severe nausea. ?Since her last visit she had a follow-up EGD with me on March 30.  Results as below, no concerning findings to cause her symptoms.  Biopsies negative for H. pylori.  She subsequently had a gastric emptying study which was normal.  I had placed her on omeprazole 40 mg once daily and she states that definitely helps with her reflux based symptoms and generally she been feeling a bit better on that but symptoms do persist.  I had given her an empiric trial of rifaximin to help with her significant gas and bloating.  She states this did help to some extent, took her out of "crisis mode" where she was having significant bloating on a frequent basis after she ate anything.  She is now able to eat much better after having taken the rifaximin.  That being said she still does have some early satiety, dyspeptic symptoms.  Her constipation seems controlled with MiraLAX treating that component of her issues. ? ?On review of her medication list she had been on gabapentin for chronic back pain after she had a fall.  She had been on narcotics in the past but not currently.  She does not take any NSAIDs routinely.  We discussed other options. ? ?Otherwise recall she has a history of fatty liver, her liver enzymes have fluctuated  from 80s to 70s over the years.  She had a negative prior serologic evaluation for this.  She admits she has had a hard time losing weight and frustrated with this.  She does not drink any alcohol.  She has not been on any medications that would cause fatty liver such as chronic steroid use etc.  Her BMI is currently 35-1/2. ? ? ?Prior work-up: ?EGD 06/2020: ?- Esophagogastric landmarks identified. ?- Normal esophagus otherwise ?- Diffusely granular gastric mucosa with patchy erythema. Biopsied to rule out H pylori. ?- Normal stomach otherwise ?- Nodule found in the duodenum, suspect could be prominent / normal variant ampulla. ?Biopsied. ?- Normal duodenum otherwise ?  ?1. Surgical [P], duodenal nodule ?- PEPTIC DUODENITIS. ?- NO DYSPLASIA OR MALIGNANCY. ?2. Surgical [P], gastric antrum and gastric body ?- REACTIVE GASTROPATHY. ?- WARTHIN-STARRY IS NEGATIVE FOR HELICOBACTER PYLORI. ?- NO INTESTINAL METAPLASIA, DYSPLASIA, OR MALIGNANCY ?  ?  ?Prior workup: ?[facility] [name] - 05/27/19 - normal gallbladder, no gallstones, fatty liver without obvious cirrhosis ?  ?  ?Colonoscopy 05/03/2017 - The perianal and digital rectal examinations were normal. ?- The terminal ileum appeared normal. ?- A 4 mm polyp was found in the transverse colon. The polyp was flat. The polyp was ?removed with a cold snare. Resection and retrieval were complete. ?- Two sessile polyps were found in the recto-sigmoid colon. The polyps were 3 to 4 mm in ?size. These polyps were removed with a cold snare. Resection and  retrieval were complete. ?- A few small-mouthed diverticula were found in the ascending colon. ?- The exam was otherwise without abnormality. ?  ?Surgical [P], transverse and cecum, polyp (3) ?- HYPERPLASTIC POLYP(S). ?- THERE IS NO EVIDENCE OF MALIGNANCY. ?  ?  ?MRI abdomen 11/15/20: IMPRESSION: ?1. Moderate hepatic steatosis with areas of focal fatty sparing ?along the gallbladder fossa which correspond with lesions seen on ?prior ultrasound.  No suspicious hepatic lesion. ?2. Benign splenic hemangiomas. ?3. Small bilateral renal cysts. ?  ?HIDA 01/04/21 - EF 11% ?  ?Hemorrhoid banding on 02/24/21, 03/15/21, 04/12/21 ?  ?  ?Echo 03/29/21 - 65-70%, grade I DD ? ?EGD 09/14/21: ?- A 1 cm hiatal hernia was present. ?- The exam of the esophagus was otherwise normal. ?- Patchy mildly erythematous mucosa with snakeskin like appearance was found in the ?proximal examined stomach. ?- The exam of the stomach was otherwise normal. Prominent fold near pylorus. ?- Biopsies were taken with a cold forceps in the gastric body, at the incisura and in the gastric ?antrum for Helicobacter pylori testing. ?- A single nodule was found in the second portion of the duodenum. Similar in appearance to ?last exam. May be the ampulla, normal variant, but hard to clearly see. Biopsies were taken ?with a cold forceps for histology. ?- The exam of the duodenum was otherwise normal. ? ?1. Surgical [P], duodenal nodule ?REACTIVE DUODENAL MUCOSA WITH GASTRIC METAPLASIA COMPATIBLE WITH PEPTIC DUODENITIS ?2. Surgical [P], gastric antrum and gastric body ?CHRONIC GASTRITIS WITH LYMPHOID AGGREGATE AND REACTIVE EPITHELIAL CHANGES ?HELICOBACTER STAIN NEGATIVE (I[facility], ADEQUATE CONTROL) ?NEGATIVE FOR INTESTINAL METAPLASIA, DYSPLASIA AND CARCINOMA ? ? ?GES 10/18/21 - negative ? ? ?Past Medical History:  ?Diagnosis Date  ? Allergy   ? Arthritis   ? Back pain   ? Carpal tunnel syndrome   ? Diabetes mellitus without complication ([place])   ? DVT (deep venous thrombosis) ([place])   ? after long car trip  ? Dyspnea   ? Gastroparesis   ? GERD (gastroesophageal reflux disease)   ? H. pylori infection   ? 1997  ? Heart murmur   ? Hypertension   ? Hypertriglyceridemia 02/22/2017  ? Hypertriglyceridemia 02/22/2017  ? Migraines   ? Sickle cell trait ([place])   ? Sleep apnea   ? states she no longer has OSA lost weight  ? ? ? ?Past Surgical History:  ?Procedure Laterality Date  ? ABDOMINAL HYSTERECTOMY    ? ovaries left in  place  ? CARPAL TUNNEL RELEASE Right 01/19/2019  ? Procedure: ENDOSCOPIC CARPAL TUNNEL RELEASE;  Surgeon: Milly Jakob, MD;  Location: [place];  Service: Orthopedics;  Laterality: Right;  ? CARPOMETACARPEL SUSPENSION PLASTY Right 01/19/2019  ? Procedure: RIGHT THUMB SUSPENSION ARTHROPLASTY;  Surgeon: Milly Jakob, MD;  Location: [facility];  Service: Orthopedics;  Laterality: Right;  PREOP BLOCK  ? CHOLECYSTECTOMY  01/24/2021  ? TOE SURGERY    ? left great toe, repair of severed tendon  ? TONSILLECTOMY    ? tummy tuck    ? ?Family History  ?Problem Relation Age of Onset  ? Diabetes type II Mother   ? High blood pressure Mother   ? Diabetes Mellitus II Father   ? Pulmonary fibrosis Father   ? Hypertension Father   ? Pulmonary disease Father   ? High Cholesterol Father   ? Hypertension Sister   ? Diabetes Sister   ? Stomach cancer Maternal Grandmother   ?     possibility that this was  colon cancer instead-patient and her mother have differing recollection  ? Cancer Maternal Grandfather   ?     lung, smoker  ? Allergic Disorder Son   ? Hyperthyroidism Son   ? Allergic Disorder Son   ? Asthma Son   ? Esophageal cancer Neg Hx   ? Pancreatic cancer Neg Hx   ? Rectal cancer Neg Hx   ? Colon cancer Neg Hx   ? ?Social History  ? ?Tobacco Use  ? Smoking status: Never  ? Smokeless tobacco: Never  ?Vaping Use  ? Vaping Use: Never used  ?Substance Use Topics  ? Alcohol use: Never  ?  Comment: never  ? Drug use: No  ? ?Current Outpatient Medications  ?Medication Sig Dispense Refill  ? albuterol (VENTOLIN HFA) 108 (90 Base) MCG/ACT inhaler Inhale 2 puffs into the lungs every 6 (six) hours as needed for wheezing or shortness of breath. 54 g 1  ? aluminum-magnesium hydroxide-simethicone (MAALOX) 448-185-63 MG/5ML SUSP Take 30 mLs by mouth 3 (three) times daily with meals as needed. 355 mL 0  ? amLODipine (NORVASC) 5 MG tablet Take 1 tablet (5 mg total) by mouth daily. 90 tablet 1  ? Ascorbic  Acid (VITAMIN C PO) Take 1 capsule by mouth daily.    ? atorvastatin (LIPITOR) 10 MG tablet Take 1 tablet (10 mg total) by mouth daily. 90 tablet 1  ? Blood Glucose Monitoring Suppl (FREESTYLE LITE) w/Device

## 2021-10-25 NOTE — Telephone Encounter (Signed)
Patient referred to [facility] ?

## 2021-11-01 ENCOUNTER — Ambulatory Visit: Payer: 59 | Admitting: Internal Medicine

## 2021-11-01 ENCOUNTER — Other Ambulatory Visit (HOSPITAL_COMMUNITY): Payer: Self-pay

## 2021-11-17 ENCOUNTER — Other Ambulatory Visit (HOSPITAL_COMMUNITY): Payer: Self-pay

## 2021-11-20 NOTE — Progress Notes (Unsigned)
Subjective:    Patient ID: Monique Wiggins, female    DOB: [birth year], 56 y.o.   MRN: [id number]  No chief complaint on file.   HPI Patient is in today for a follow up.  Past Medical History:  Diagnosis Date   Allergy    Arthritis    Back pain    Carpal tunnel syndrome    Diabetes mellitus without complication ([place])    DVT (deep venous thrombosis) ([place])    after long car trip   Dyspnea    Gastroparesis    GERD (gastroesophageal reflux disease)    H. pylori infection    1997   Heart murmur    Hypertension    Hypertriglyceridemia 02/22/2017   Hypertriglyceridemia 02/22/2017   Migraines    Sickle cell trait ([facility])    Sleep apnea    states she no longer has OSA lost weight    Past Surgical History:  Procedure Laterality Date   ABDOMINAL HYSTERECTOMY     ovaries left in place   CARPAL TUNNEL RELEASE Right 01/19/2019   Procedure: ENDOSCOPIC CARPAL TUNNEL RELEASE;  Surgeon: Milly Jakob, MD;  Location: [place];  Service: Orthopedics;  Laterality: Right;   CARPOMETACARPEL SUSPENSION PLASTY Right 01/19/2019   Procedure: RIGHT THUMB SUSPENSION ARTHROPLASTY;  Surgeon: Milly Jakob, MD;  Location: [place];  Service: Orthopedics;  Laterality: Right;  PREOP BLOCK   CHOLECYSTECTOMY  01/24/2021   TOE SURGERY     left great toe, repair of severed tendon   TONSILLECTOMY     tummy tuck      Family History  Problem Relation Age of Onset   Diabetes type II Mother    High blood pressure Mother    Diabetes Mellitus II Father    Pulmonary fibrosis Father    Hypertension Father    Pulmonary disease Father    High Cholesterol Father    Hypertension Sister    Diabetes Sister    Stomach cancer Maternal Grandmother        possibility that this was colon cancer instead-patient and her mother have differing recollection   Cancer Maternal Grandfather        lung, smoker   Allergic Disorder Son    Hyperthyroidism Son    Allergic  Disorder Son    Asthma Son    Esophageal cancer Neg Hx    Pancreatic cancer Neg Hx    Rectal cancer Neg Hx    Colon cancer Neg Hx     Social History   Socioeconomic History   Marital status: Married    Spouse name: Trilby Drummer   Number of children: Not on file   Years of education: Not on file   Highest education level: Not on file  Occupational History   Occupation: Patient [employer]  Tobacco Use   Smoking status: Never   Smokeless tobacco: Never  Vaping Use   Vaping Use: Never used  Substance and Sexual Activity   Alcohol use: Never    Comment: never   Drug use: No   Sexual activity: Yes    Birth control/protection: Surgical  Other Topics Concern   Not on file  Social History Narrative   ** Merged History Encounter **       Married   5 children (4 boys and 1 girl) All grown, has one daughter in [place], son in [place], son in [place], one in [place], youngest in Nature conservation officer in [place]   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   Enjoys carpentry, Armed forces logistics/support/administrative officer, Materials engineer      Lactose intolerant   Social Determinants of Health   Financial Resource Strain: Not on file  Food Insecurity: Not on file  Transportation Needs: Not on file  Physical Activity: Not on file  Stress: Not on file  Social Connections: Not on file  Intimate Partner Violence: Not on file    Outpatient Medications Prior to Visit  Medication Sig Dispense Refill   albuterol (VENTOLIN HFA) 108 (90 Base) MCG/ACT inhaler Inhale 2 puffs into the lungs every 6 (six) hours as needed for wheezing or shortness of breath. 54 g 1   aluminum-magnesium hydroxide-simethicone (MAALOX) 355-732-20 MG/5ML SUSP Take 30 mLs by mouth 3 (three) times daily with meals as needed. 355 mL 0   amLODipine (NORVASC) 5 MG tablet Take 1 tablet (5 mg total) by mouth daily. 90 tablet 1   Ascorbic Acid (VITAMIN C PO) Take 1 capsule by mouth daily.     atorvastatin (LIPITOR)  10 MG tablet Take 1 tablet (10 mg total) by mouth daily. 90 tablet 1   Blood Glucose Monitoring Suppl (FREESTYLE LITE) w/Device KIT Use as directed to test blood sugar once a day 1 kit 0   Budeson-Glycopyrrol-Formoterol (BREZTRI AEROSPHERE) 160-9-4.8 MCG/ACT AERO Inhale 2 puffs into the lungs in the morning and at bedtime. 10.7 g 0   carvedilol (COREG) 25 MG tablet TAKE 1 TABLET BY MOUTH 2 TIMES DAILY WITH A MEAL. 180 tablet 1   cetirizine (ZYRTEC) 10 MG tablet Take 1 tablet (10 mg total) by mouth daily. 90 tablet 1   chlorhexidine (PERIDEX) 0.12 % solution 15 mLs 2 (two) times daily.     cholecalciferol (VITAMIN D3) 25 MCG (1000 UNIT) tablet Take 1,000 Units by mouth daily.     Continuous Blood Gluc Receiver (DEXCOM G6 RECEIVER) DEVI Use as directed to check blood sugars 1 each 0   Continuous Blood Gluc Sensor (DEXCOM G6 SENSOR) MISC Check blood sugars as directed 3 each 3   Continuous Blood Gluc Transmit (DEXCOM G6 TRANSMITTER) MISC Check blood sugars as directed 1 each 0   Cyanocobalamin (B-12 SL) Place 1 capsule under the tongue daily.     dapagliflozin propanediol (FARXIGA) 10 MG TABS tablet Take 1 tablet (10 mg total) by mouth daily. 90 tablet 3   DULoxetine (CYMBALTA) 30 MG capsule Take 1 capsule by mouth once daily for 2 weeks then increase to 2 capsules daily thereafter 180 capsule 1   fluticasone (FLONASE) 50 MCG/ACT nasal spray Place 2 sprays into both nostrils daily as needed for allergies. 48 g 1   fluticasone-salmeterol (ADVAIR DISKUS) 500-50 MCG/ACT AEPB Inhale 1 puff into the lungs in the morning and at bedtime. 60 each 3   gabapentin (NEURONTIN) 300 MG capsule Take 1 capsule (300 mg total) by mouth at bedtime. 90 capsule 1   glipiZIDE (GLUCOTROL) 5 MG tablet Take 1/2 tablet (2.5 mg total) by mouth daily before breakfast. 45 tablet 3   glucose blood test strip Use as directed to test blood sugar daily in the afternoon 100 each 3   hydrochlorothiazide (HYDRODIURIL) 25 MG tablet Take  1 tablet (25 mg total) by mouth daily. 90 tablet 1   hyoscyamine (ANASPAZ) 0.125 MG TBDP disintergrating tablet Place 1 tablet (0.125 mg total) under the tongue every 6 (six) hours as needed for cramping. 60 tablet 2   Lancets (FREESTYLE) lancets Use as  directed to test once a day 100 each 3   linaclotide (LINZESS) 145 MCG CAPS capsule Take 1 capsule (145 mcg total) by mouth daily before breakfast. 30 capsule 2   losartan (COZAAR) 100 MG tablet TAKE 1 TABLET BY MOUTH DAILY *TAKE WITH HCTZ 90 tablet 1   LOTEMAX 0.5 % ophthalmic suspension Place 1 drop into both eyes 4 (four) times daily as needed for irritation.     metFORMIN (GLUCOPHAGE) 500 MG tablet Take 1 tablet (500 mg total) by mouth daily with breakfast. 90 tablet 3   mometasone (ELOCON) 0.1 % cream Apply to affected area daily. 135 g 1   Multiple Vitamin (MULTIVITAMIN WITH MINERALS) TABS tablet Take 1 tablet by mouth daily.     Multiple Vitamins-Minerals (ZINC PO) Take 1 tablet by mouth daily.     Omega-3 Fatty Acids (FISH OIL PO) Take 1 capsule by mouth daily.     omeprazole (PRILOSEC) 40 MG capsule Take 1 capsule (40 mg total) by mouth daily. 90 capsule 1   ondansetron (ZOFRAN-ODT) 4 MG disintegrating tablet Dissolve 1 tablet (4 mg total) by mouth in the morning, at noon, and at bedtime. 90 tablet 2   Polyvinyl Alcohol-Povidone (CLEAR EYES NATURAL TEARS OP) Place 1 drop into both eyes daily as needed (dry eyes).     potassium chloride SA (KLOR-CON M) 20 MEQ tablet Take 1 tablet (20 mEq total) by mouth daily. 90 tablet 1   rifaximin (XIFAXAN) 550 MG TABS tablet Take 1 tablet (550 mg total) by mouth 3 (three) times daily. Lot: [id number], Exp:   11-2025 42 tablet 0   topiramate (TOPAMAX) 50 MG tablet Take 1 tablet (50 mg total) by mouth daily. 90 tablet 1   tretinoin (RETIN-A) 0.025 % cream Apply to affected area at bedtime. 135 g 1   VITAMIN E PO Take 1 capsule by mouth daily.     No facility-administered medications prior to visit.     Allergies  Allergen Reactions   Pollen Extract Swelling   Shellfish Allergy Swelling    ROS     Objective:    Physical Exam  There were no vitals taken for this visit. Wt Readings from Last 3 Encounters:  10/25/21 193 lb 6.4 oz (87.7 kg)  09/14/21 192 lb (87.1 kg)  09/12/21 193 lb 12.8 oz (87.9 kg)    Diabetic Foot Exam - Simple   No data filed    Lab Results  Component Value Date   WBC 7.0 08/31/2021   HGB 14.5 08/31/2021   HCT 44.6 08/31/2021   PLT 344.0 08/31/2021   GLUCOSE 152 (H) 08/31/2021   CHOL 110 08/31/2021   TRIG 191.0 (H) 08/31/2021   HDL 52.40 08/31/2021   LDLDIRECT 20.0 11/10/2020   LDLCALC 19 08/31/2021   ALT 54 (H) 08/31/2021   AST 28 08/31/2021   NA 140 08/31/2021   K 3.2 (L) 08/31/2021   CL 105 08/31/2021   CREATININE 1.00 08/31/2021   BUN 25 (H) 08/31/2021   CO2 23 08/31/2021   TSH 2.00 08/31/2021   HGBA1C 7.3 (H) 08/31/2021   MICROALBUR 3.9 (H) 05/20/2019    Lab Results  Component Value Date   TSH 2.00 08/31/2021   Lab Results  Component Value Date   WBC 7.0 08/31/2021   HGB 14.5 08/31/2021   HCT 44.6 08/31/2021   MCV 83.7 08/31/2021   PLT 344.0 08/31/2021   Lab Results  Component Value Date   NA 140 08/31/2021   K 3.2 (L) 08/31/2021  CO2 23 08/31/2021   GLUCOSE 152 (H) 08/31/2021   BUN 25 (H) 08/31/2021   CREATININE 1.00 08/31/2021   BILITOT 0.4 08/31/2021   ALKPHOS 113 08/31/2021   AST 28 08/31/2021   ALT 54 (H) 08/31/2021   PROT 7.8 08/31/2021   ALBUMIN 4.7 08/31/2021   CALCIUM 9.7 08/31/2021   ANIONGAP 9 01/23/2021   GFR 62.81 08/31/2021   Lab Results  Component Value Date   CHOL 110 08/31/2021   Lab Results  Component Value Date   HDL 52.40 08/31/2021   Lab Results  Component Value Date   LDLCALC 19 08/31/2021   Lab Results  Component Value Date   TRIG 191.0 (H) 08/31/2021   Lab Results  Component Value Date   CHOLHDL 2 08/31/2021   Lab Results  Component Value Date   HGBA1C 7.3 (H)  08/31/2021       Assessment & Plan:   Problem List Items Addressed This Visit   None   I am having Breon C. Frazee maintain her linaclotide, aluminum-magnesium hydroxide-simethicone, Lotemax, multivitamin with minerals, Cyanocobalamin (B-12 SL), Multiple Vitamins-Minerals (ZINC PO), Ascorbic Acid (VITAMIN C PO), Omega-3 Fatty Acids (FISH OIL PO), VITAMIN E PO, Polyvinyl Alcohol-Povidone (CLEAR EYES NATURAL TEARS OP), freestyle, FreeStyle Lite, glucose blood, cholecalciferol, fluticasone-salmeterol, mometasone, tretinoin, albuterol, amLODipine, atorvastatin, carvedilol, cetirizine, fluticasone, gabapentin, hydrochlorothiazide, losartan, potassium chloride SA, topiramate, dapagliflozin propanediol, glipiZIDE, metFORMIN, Dexcom G6 Receiver, Dexcom G6 Sensor, omeprazole, ondansetron, hyoscyamine, rifaximin, Breztri Aerosphere, chlorhexidine, Dexcom G6 Transmitter, and DULoxetine.  No orders of the defined types were placed in this encounter.

## 2021-11-21 ENCOUNTER — Other Ambulatory Visit (HOSPITAL_COMMUNITY): Payer: Self-pay

## 2021-11-21 ENCOUNTER — Encounter: Payer: Self-pay | Admitting: Family Medicine

## 2021-11-21 ENCOUNTER — Ambulatory Visit: Payer: 59 | Admitting: Family Medicine

## 2021-11-21 VITALS — BP 144/90 | HR 85 | Resp 20 | Ht 61.75 in | Wt 188.0 lb

## 2021-11-21 DIAGNOSIS — Z86718 Personal history of other venous thrombosis and embolism: Secondary | ICD-10-CM | POA: Diagnosis not present

## 2021-11-21 DIAGNOSIS — E781 Pure hyperglyceridemia: Secondary | ICD-10-CM

## 2021-11-21 DIAGNOSIS — E785 Hyperlipidemia, unspecified: Secondary | ICD-10-CM | POA: Diagnosis not present

## 2021-11-21 DIAGNOSIS — H269 Unspecified cataract: Secondary | ICD-10-CM

## 2021-11-21 DIAGNOSIS — K219 Gastro-esophageal reflux disease without esophagitis: Secondary | ICD-10-CM

## 2021-11-21 DIAGNOSIS — E119 Type 2 diabetes mellitus without complications: Secondary | ICD-10-CM

## 2021-11-21 DIAGNOSIS — H40053 Ocular hypertension, bilateral: Secondary | ICD-10-CM | POA: Diagnosis not present

## 2021-11-21 DIAGNOSIS — H25813 Combined forms of age-related cataract, bilateral: Secondary | ICD-10-CM | POA: Diagnosis not present

## 2021-11-21 DIAGNOSIS — E1136 Type 2 diabetes mellitus with diabetic cataract: Secondary | ICD-10-CM | POA: Diagnosis not present

## 2021-11-21 DIAGNOSIS — E1165 Type 2 diabetes mellitus with hyperglycemia: Secondary | ICD-10-CM

## 2021-11-21 DIAGNOSIS — E669 Obesity, unspecified: Secondary | ICD-10-CM

## 2021-11-21 DIAGNOSIS — E876 Hypokalemia: Secondary | ICD-10-CM

## 2021-11-21 MED ORDER — SERTRALINE HCL 50 MG PO TABS
50.0000 mg | ORAL_TABLET | Freq: Every day | ORAL | 1 refills | Status: DC
  Filled 2021-11-21: qty 90, 90d supply, fill #0
  Filled 2022-02-13: qty 90, 90d supply, fill #1

## 2021-11-21 NOTE — Patient Instructions (Signed)
Mounjaro, Trulicity, Ozempic if you want to try them let me know     Carbohydrate Counting for Diabetes Mellitus, Adult Carbohydrate counting is a method of keeping track of how many carbohydrates you eat. Eating carbohydrates increases the amount of sugar (glucose) in the blood. Counting how many carbohydrates you eat improves how well you manage your blood glucose. This, in turn, helps you manage your diabetes. Carbohydrates are measured in grams (g) per serving. It is important to know how many carbohydrates (in grams or by serving size) you can have in each meal. This is different for every person. A dietitian can help you make a meal plan and calculate how many carbohydrates you should have at each meal and snack. What foods contain carbohydrates? Carbohydrates are found in the following foods: Grains, such as breads and cereals. Dried beans and soy products. Starchy vegetables, such as potatoes, peas, and corn. Fruit and fruit juices. Milk and yogurt. Sweets and snack foods, such as cake, cookies, candy, chips, and soft drinks. How do I count carbohydrates in foods? There are two ways to count carbohydrates in food. You can read food labels or learn standard serving sizes of foods. You can use either of these methods or a combination of both. Using the Nutrition Facts label The Nutrition Facts list is included on the labels of almost all packaged foods and beverages in the [place]. It includes: The serving size. Information about nutrients in each serving, including the grams of carbohydrate per serving. To use the Nutrition Facts, decide how many servings you will have. Then, multiply the number of servings by the number of carbohydrates per serving. The resulting number is the total grams of carbohydrates that you will be having. Learning the standard serving sizes of foods When you eat carbohydrate foods that are not packaged or do not include Nutrition Facts on the label, you  need to measure the servings in order to count the grams of carbohydrates. Measure the foods that you will eat with a food scale or measuring cup, if needed. Decide how many standard-size servings you will eat. Multiply the number of servings by 15. For foods that contain carbohydrates, one serving equals 15 g of carbohydrates. For example, if you eat 2 cups or 10 oz (300 g) of strawberries, you will have eaten 2 servings and 30 g of carbohydrates (2 servings x 15 g = 30 g). For foods that have more than one food mixed, such as soups and casseroles, you must count the carbohydrates in each food that is included. The following list contains standard serving sizes of common carbohydrate-rich foods. Each of these servings has about 15 g of carbohydrates: 1 slice of bread. 1 six-inch (15 cm) tortilla. ? cup or 2 oz (53 g) cooked rice or pasta.  cup or 3 oz (85 g) cooked or canned, drained and rinsed beans or lentils.  cup or 3 oz (85 g) starchy vegetable, such as peas, corn, or squash.  cup or 4 oz (120 g) hot cereal.  cup or 3 oz (85 g) boiled or mashed potatoes, or  or 3 oz (85 g) of a large baked potato.  cup or 4 fl oz (118 mL) fruit juice. 1 cup or 8 fl oz (237 mL) milk. 1 small or 4 oz (106 g) apple.  or 2 oz (63 g) of a medium banana. 1 cup or 5 oz (150 g) strawberries. 3 cups or 1 oz (28.3 g) popped popcorn. What is an example  of carbohydrate counting? To calculate the grams of carbohydrates in this sample meal, follow the steps shown below. Sample meal 3 oz (85 g) chicken breast. ? cup or 4 oz (106 g) brown rice.  cup or 3 oz (85 g) corn. 1 cup or 8 fl oz (237 mL) milk. 1 cup or 5 oz (150 g) strawberries with sugar-free whipped topping. Carbohydrate calculation Identify the foods that contain carbohydrates: Rice. Corn. Milk. Strawberries. Calculate how many servings you have of each food: 2 servings rice. 1 serving corn. 1 serving milk. 1 serving  strawberries. Multiply each number of servings by 15 g: 2 servings rice x 15 g = 30 g. 1 serving corn x 15 g = 15 g. 1 serving milk x 15 g = 15 g. 1 serving strawberries x 15 g = 15 g. Add together all of the amounts to find the total grams of carbohydrates eaten: 30 g + 15 g + 15 g + 15 g = 75 g of carbohydrates total. What are tips for following this plan? Shopping Develop a meal plan and then make a shopping list. Buy fresh and frozen vegetables, fresh and frozen fruit, dairy, eggs, beans, lentils, and whole grains. Look at food labels. Choose foods that have more fiber and less sugar. Avoid processed foods and foods with added sugars. Meal planning Aim to have the same number of grams of carbohydrates at each meal and for each snack time. Plan to have regular, balanced meals and snacks. Where to find more information American Diabetes Association: diabetes.org Centers for Disease Control and Prevention: StoreMirror.com.[place] Academy of Nutrition and Dietetics: eatright.org Association of Diabetes Care & Education Specialists: diabeteseducator.org Summary Carbohydrate counting is a method of keeping track of how many carbohydrates you eat. Eating carbohydrates increases the amount of sugar (glucose) in your blood. Counting how many carbohydrates you eat improves how well you manage your blood glucose. This helps you manage your diabetes. A dietitian can help you make a meal plan and calculate how many carbohydrates you should have at each meal and snack. This information is not intended to replace advice given to you by your health care provider. Make sure you discuss any questions you have with your health care provider. Document Revised: 01/06/2020 Document Reviewed: 01/06/2020 Elsevier Patient Education  [place].

## 2021-11-22 DIAGNOSIS — H269 Unspecified cataract: Secondary | ICD-10-CM | POA: Insufficient documentation

## 2021-11-22 NOTE — Assessment & Plan Note (Addendum)
Encouraged DASH or MIND diet, decrease po intake and increase exercise as tolerated. Needs 7-8 hours of sleep nightly. Avoid trans fats, eat small, frequent meals every 4-5 hours with lean proteins, complex carbs and healthy fats. Minimize simple carbs, high fat foods and processed foods. She is considering HWW

## 2021-11-22 NOTE — Assessment & Plan Note (Signed)
Has surgery scheduled for removal in July, left side will be first.

## 2021-11-22 NOTE — Assessment & Plan Note (Signed)
hgba1c acceptable, minimize simple carbs. Increase exercise as tolerated. Continue current meds, patient is asked to consider Mounjaro, Ozempic or Trulicity depending on what her insurance will cover. She will let us know

## 2021-11-22 NOTE — Assessment & Plan Note (Deleted)
hgba1c acceptable, minimize simple carbs. Increase exercise as tolerated. Continue current meds, patient is asked to consider Mounjaro, Ozempic or Trulicity depending on what her insurance will cover. She will let us know

## 2021-11-22 NOTE — Assessment & Plan Note (Signed)
Encourage heart healthy diet such as MIND or DASH diet, increase exercise, avoid trans fats, simple carbohydrates and processed foods, consider a krill or fish or flaxseed oil cap daily. Tolerating Atorvastatin 

## 2021-11-27 ENCOUNTER — Encounter: Payer: 59 | Attending: Internal Medicine | Admitting: Dietician

## 2021-11-27 ENCOUNTER — Other Ambulatory Visit: Payer: Self-pay

## 2021-11-27 ENCOUNTER — Encounter: Payer: Self-pay | Admitting: Dietician

## 2021-11-27 ENCOUNTER — Other Ambulatory Visit (INDEPENDENT_AMBULATORY_CARE_PROVIDER_SITE_OTHER): Payer: 59

## 2021-11-27 ENCOUNTER — Other Ambulatory Visit (HOSPITAL_COMMUNITY): Payer: Self-pay

## 2021-11-27 ENCOUNTER — Other Ambulatory Visit: Payer: Self-pay | Admitting: Family Medicine

## 2021-11-27 DIAGNOSIS — E1165 Type 2 diabetes mellitus with hyperglycemia: Secondary | ICD-10-CM

## 2021-11-27 DIAGNOSIS — E781 Pure hyperglyceridemia: Secondary | ICD-10-CM | POA: Diagnosis not present

## 2021-11-27 DIAGNOSIS — E876 Hypokalemia: Secondary | ICD-10-CM

## 2021-11-27 LAB — COMPREHENSIVE METABOLIC PANEL
ALT: 47 U/L — ABNORMAL HIGH (ref 0–35)
AST: 26 U/L (ref 0–37)
Albumin: 4.5 g/dL (ref 3.5–5.2)
Alkaline Phosphatase: 104 U/L (ref 39–117)
BUN: 19 mg/dL (ref 6–23)
CO2: 25 mEq/L (ref 19–32)
Calcium: 9.5 mg/dL (ref 8.4–10.5)
Chloride: 104 mEq/L (ref 96–112)
Creatinine, Ser: 1.09 mg/dL (ref 0.40–1.20)
GFR: 56.54 mL/min — ABNORMAL LOW (ref >60.00)
Glucose, Bld: 136 mg/dL — ABNORMAL HIGH (ref 70–99)
Potassium: 3.4 mEq/L — ABNORMAL LOW (ref 3.5–5.1)
Sodium: 141 mEq/L (ref 135–145)
Total Bilirubin: 0.4 mg/dL (ref 0.2–1.2)
Total Protein: 7.3 g/dL (ref 6.0–8.3)

## 2021-11-27 LAB — LIPID PANEL
Cholesterol: 129 mg/dL (ref 0–200)
HDL: 46.1 mg/dL (ref >39.00)
NonHDL: 83.35
Total CHOL/HDL Ratio: 3
Triglycerides: 257 mg/dL — ABNORMAL HIGH (ref 0.0–149.0)
VLDL: 51.4 mg/dL — ABNORMAL HIGH (ref 0.0–40.0)

## 2021-11-27 LAB — MICROALBUMIN / CREATININE URINE RATIO
Creatinine,U: 71 mg/dL
Microalb Creat Ratio: 1.4 mg/g (ref 0.0–30.0)
Microalb, Ur: 1 mg/dL (ref 0.0–1.9)

## 2021-11-27 LAB — CBC
HCT: 44 % (ref 36.0–46.0)
Hemoglobin: 14.1 g/dL (ref 12.0–15.0)
MCHC: 32.1 g/dL (ref 30.0–36.0)
MCV: 84 fl (ref 78.0–100.0)
Platelets: 319 10*3/uL (ref 150.0–400.0)
RBC: 5.23 Mil/uL — ABNORMAL HIGH (ref 3.87–5.11)
RDW: 14 % (ref 11.5–15.5)
WBC: 8.5 10*3/uL (ref 4.0–10.5)

## 2021-11-27 LAB — TSH: TSH: 0.83 u[IU]/mL (ref 0.35–5.50)

## 2021-11-27 LAB — HEMOGLOBIN A1C: Hgb A1c MFr Bld: 7.3 % — ABNORMAL HIGH (ref 4.6–6.5)

## 2021-11-27 LAB — LDL CHOLESTEROL, DIRECT: Direct LDL: 29 mg/dL

## 2021-11-27 MED ORDER — DEXCOM G6 TRANSMITTER MISC
0 refills | Status: DC
  Filled 2021-11-27: qty 1, fill #0
  Filled 2021-11-27: qty 1, 90d supply, fill #0

## 2021-11-27 MED ORDER — DEXCOM G6 SENSOR MISC
3 refills | Status: DC
  Filled 2021-11-27: qty 3, 30d supply, fill #0
  Filled 2021-12-20: qty 3, 30d supply, fill #1
  Filled 2022-01-16: qty 3, 30d supply, fill #2
  Filled 2022-02-22: qty 3, 30d supply, fill #3

## 2021-11-27 NOTE — Addendum Note (Signed)
Addended by: Lynnea Ferrier R on: 11/27/2021 04:35 PM   Modules accepted: Orders

## 2021-11-27 NOTE — Progress Notes (Signed)
Diabetes Self-Management Education  Visit Type: Follow-up  Appt. Start Time: 1450 Appt. End Time: 4562  12/01/2021  [name], identified by name and date of birth, is a 56 y.o. female with a diagnosis of Diabetes:  .   ASSESSMENT Patient is here today with her husband.  She was last seen by this RD on 07/20/2021.  Patient's mother moved in with them and she also has diabetes and has been fixing her foods which has helped. She is now making herself small plates. She is not juicing as much recently. She has not been to [place] since moving her mother.  She is getting ready to have cataract surgery and will begin exercising again after this. Stopping snack before bed or occasional sugar free ice pop or occasional fig newtons or gingersnaps Dexcom Time in Range 78%, 21% high and <1% low She stated that medical tests showed that she has slow digestion but not gastroparesis.   History includes Type 2 diabetes, nausea after gallbladder removed 02/2021, HTN, HLD, OSA (dx years ago and no longer uses c-pap)  Poor sleep (3 hours per night), wakes frequently to use the restroom Back pain A1C  7.3% 08/31/2021, 7.1% 01/23/2021, cholesterol 106, HDL 45, Triglycerides 234 10/2020, GFR 62 08/31/2021  Medications include:  Farxiga, Metformin, glipizide, Vitamin B-12, vitamin D3, omega 3, zinc, vitamin C CDG:  Dexcom G6  Weight hx: 193 lbs 07/20/2021 192 lbs 05/18/2021 187 lbs 03/15/2021 171 lbs 01/11/2020 155 lbs lowest adult weight 2018 195 lbs highest adult weight  10/17/2017   Patient lives with her husband.  They share shopping and cooking.  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  She is a Geophysicist/field seismologist (babies and dogs). Allergic to shellfish Lactose intolerant Spinach and all greens causes diarrhea.  Lettuce does not cause symptoms. Doesn't tolerate raw onions Her husband is very supportive but enjoys feeding her and likes to snack at  night. Increased stress this year.   Diabetes Self-Management Education - 12/01/21 1021       Visit Information   Visit Type Follow-up      Psychosocial Assessment   Other persons present Patient;Spouse/SO    Patient Concerns Nutrition/Meal planning    Special Needs None      Pre-Education Assessment   Patient understands the diabetes disease and treatment process. Demonstrates understanding / competency    Patient understands incorporating nutritional management into lifestyle. Comprehends key points    Patient undertands incorporating physical activity into lifestyle. Comprehends key points    Patient understands using medications safely. Demonstrates understanding / competency    Patient understands monitoring blood glucose, interpreting and using results Demonstrates understanding / competency    Patient understands prevention, detection, and treatment of acute complications. Demonstrates understanding / competency    Patient understands prevention, detection, and treatment of chronic complications. Demonstrates understanding / competency    Patient understands how to develop strategies to address psychosocial issues. Demonstrates understanding / competency    Patient understands how to develop strategies to promote health/change behavior. Comprehends key points      Complications   Last HgB A1C per patient/outside source 7.3 %   08/31/2021   How often do you check your blood sugar? > 4 times/day    Number of hypoglycemic episodes per month 1    Can you tell when your blood sugar is low? Yes      Dietary Intake   Breakfast 2 eggs, olives, occasional grits or cream  of wheat    Lunch [place] burger withouto bread    Dinner chicken breast or salmon or burger or pork tenderloin (air fried), mashed potato or sweet potato, broccoli    Snack (evening) occasional cookies    Beverage(s) water, juice, broth      Activity / Exercise   Activity / Exercise Type ADL's      Patient  Education   Previous Diabetes Education Yes (please comment)   07/2021   Healthy Eating Meal options for control of blood glucose level and chronic complications.;Food label reading, portion sizes and measuring food.;Plate Method    Being Active Role of exercise on diabetes management, blood pressure control and cardiac health.;Other (comment)   encouraged resuming   Medications Reviewed patients medication for diabetes, action, purpose, timing of dose and side effects.    Monitoring Identified appropriate SMBG and/or A1C goals.;Taught/evaluated CGM (comment)      Individualized Goals (developed by patient)   Nutrition General guidelines for healthy choices and portions discussed    Physical Activity Exercise 3-5 times per week;45 minutes per day    Medications take my medication as prescribed    Monitoring  Consistenly use CGM    Problem Solving Eating Pattern    Reducing Risk examine blood glucose patterns;do foot checks daily;treat hypoglycemia with 15 grams of carbs if blood glucose less than '70mg'$ /dL      Patient Self-Evaluation of Goals - Patient rates self as meeting previously set goals (% of time)   Nutrition 50 - 75 % (half of the time)    Physical Activity < 25% (hardly ever/never)    Medications >75% (most of the time)    Monitoring >75% (most of the time)    Problem Solving and behavior change strategies  >75% (most of the time)    Reducing Risk (treating acute and chronic complications) >63% (most of the time)    Health Coping >75% (most of the time)      Post-Education Assessment   Patient understands the diabetes disease and treatment process. Demonstrates understanding / competency    Patient understands incorporating nutritional management into lifestyle. Comprehends key points    Patient undertands incorporating physical activity into lifestyle. Demonstrates understanding / competency    Patient understands using medications safely. Demonstrates understanding / competency     Patient understands monitoring blood glucose, interpreting and using results Demonstrates understanding / competency    Patient understands prevention, detection, and treatment of acute complications. Demonstrates understanding / competency    Patient understands prevention, detection, and treatment of chronic complications. Demonstrates understanding / competency    Patient understands how to develop strategies to address psychosocial issues. Demonstrates understanding / competency    Patient understands how to develop strategies to promote health/change behavior. Comprehends key points      Outcomes   Expected Outcomes Demonstrated interest in learning. Expect positive outcomes    Future DMSE 3-4 months    Program Status Not Completed             Individualized Plan for Diabetes Self-Management Training:   Learning Objective:  Patient will have a greater understanding of diabetes self-management. Patient education plan is to attend individual and/or group sessions per assessed needs and concerns.   Plan:   Patient Instructions  1 [phone]  Dexcom Customer Support  Consider 2 Tablespoons of ground flax  daily to help with constipation. Does cheese increase your constipation? Ground flax seeds may also be helpful for your blood pressure along with increased  vegetables and fresh fruit and low sodium diet  Add a small amount of carbohydrate with breakfast such as greek yogurt or oatmeal or grits or berries or other fruit.  Add vegetables to your lunch.  Get back to the Zumba  Ezekiel bread  Expected Outcomes:  Demonstrated interest in learning. Expect positive outcomes  Education material provided:   If problems or questions, patient to contact team via:  Phone  Future DSME appointment: 3-4 months

## 2021-11-27 NOTE — Patient Instructions (Addendum)
1 [phone]  Dexcom Customer Support  Consider 2 Tablespoons of ground flax  daily to help with constipation. Does cheese increase your constipation? Ground flax seeds may also be helpful for your blood pressure along with increased vegetables and fresh fruit and low sodium diet  Add a small amount of carbohydrate with breakfast such as greek yogurt or oatmeal or grits or berries or other fruit.  Add vegetables to your lunch.  Get back to the [employer]

## 2021-11-29 ENCOUNTER — Other Ambulatory Visit: Payer: Self-pay

## 2021-11-29 ENCOUNTER — Other Ambulatory Visit (HOSPITAL_COMMUNITY): Payer: Self-pay

## 2021-11-29 DIAGNOSIS — E876 Hypokalemia: Secondary | ICD-10-CM

## 2021-11-29 MED ORDER — POTASSIUM CHLORIDE CRYS ER 20 MEQ PO TBCR
EXTENDED_RELEASE_TABLET | ORAL | 1 refills | Status: DC
  Filled 2021-11-29: qty 114, 90d supply, fill #0
  Filled 2022-04-19: qty 114, 90d supply, fill #1

## 2021-11-29 MED ORDER — ATORVASTATIN CALCIUM 20 MG PO TABS
20.0000 mg | ORAL_TABLET | Freq: Every day | ORAL | 1 refills | Status: DC
  Filled 2021-11-29: qty 90, 90d supply, fill #0
  Filled 2022-04-19: qty 90, 90d supply, fill #1

## 2021-12-08 ENCOUNTER — Ambulatory Visit: Payer: 59 | Admitting: Internal Medicine

## 2021-12-12 DIAGNOSIS — H25812 Combined forms of age-related cataract, left eye: Secondary | ICD-10-CM | POA: Diagnosis not present

## 2021-12-13 ENCOUNTER — Ambulatory Visit: Payer: 59 | Admitting: Pulmonary Disease

## 2021-12-20 ENCOUNTER — Other Ambulatory Visit (HOSPITAL_COMMUNITY): Payer: Self-pay

## 2021-12-21 ENCOUNTER — Other Ambulatory Visit (HOSPITAL_COMMUNITY): Payer: Self-pay

## 2021-12-28 DIAGNOSIS — H25813 Combined forms of age-related cataract, bilateral: Secondary | ICD-10-CM | POA: Diagnosis not present

## 2021-12-28 DIAGNOSIS — H25811 Combined forms of age-related cataract, right eye: Secondary | ICD-10-CM | POA: Diagnosis not present

## 2022-01-16 ENCOUNTER — Other Ambulatory Visit: Payer: Self-pay | Admitting: Family Medicine

## 2022-01-16 ENCOUNTER — Other Ambulatory Visit (HOSPITAL_COMMUNITY): Payer: Self-pay

## 2022-01-16 MED ORDER — TRETINOIN 0.025 % EX CREA
TOPICAL_CREAM | Freq: Every day | CUTANEOUS | 1 refills | Status: DC
  Filled 2022-01-16: qty 45, 30d supply, fill #0
  Filled 2022-02-22: qty 135, 90d supply, fill #1
  Filled 2022-05-22: qty 45, 30d supply, fill #2
  Filled 2022-08-07: qty 45, 30d supply, fill #3

## 2022-01-17 ENCOUNTER — Encounter: Payer: Self-pay | Admitting: Pulmonary Disease

## 2022-01-17 ENCOUNTER — Ambulatory Visit: Payer: 59 | Admitting: Pulmonary Disease

## 2022-01-17 ENCOUNTER — Other Ambulatory Visit (HOSPITAL_COMMUNITY): Payer: Self-pay

## 2022-01-17 VITALS — BP 120/78 | HR 67 | Temp 98.4°F | Wt 187.0 lb

## 2022-01-17 DIAGNOSIS — R0600 Dyspnea, unspecified: Secondary | ICD-10-CM | POA: Diagnosis not present

## 2022-01-17 NOTE — Patient Instructions (Addendum)
Nice to see you again  Continue Breztri 2 puffs twice a day.  No changes to medications for now.  Please send a MyChart message if I can help in any way  Return to clinic in 6 months or sooner as needed with Dr. Silas Flood

## 2022-01-17 NOTE — Progress Notes (Signed)
@Patient  ID: Monique Wiggins, female    DOB: [birth year], 56 y.o.   MRN: [id number]  Chief Complaint  Patient presents with   Follow-up    Pt is here for follow up for DOE. Pt states she just went back to work last week due to Cataract surgery. Pt states that she is doing well. Pt is on Breztri. And albuterol as needed. Pt states that when she was out due to surgery she was not taking the [place] that often.     Referring provider: Mosie Lukes, MD  HPI:   56 y.o. whom are seen in follow up for evaluation of dyspnea on exertion.  Most recent GI note reviewed.  Most recent PCP note reviewed.  She returns for follow-up.  Breathing doing okay.  Remains on Breztri.  Not really using right now as she is out of work due to cataract surgery.  Breathing no worse.  Cannot remember or difficult for her to ascertain at if the [facility] was helpful a few months ago when he first started it.  No cough.  No real concerns today.  Ongoing issues with vision.  Recent emergency cataract surgery.  Has follow-up with eye doctor tomorrow.  HPI initial visit: Patient notes dyspnea exertion for a while now.  Started maybe couple years ago.  Worse over the last several months.  Worse on inclines or stairs.  No time of day when things are better or worse.  No position makes things better or worse.  No seasonal environmental factors she can identify.  Possibly triggered by viral infection back in 2020.  Use albuterol as needed.  This does seem to help.  She endorses a history of seasonal allergies.  Takes montelukast and Zyrtec.  These seem to help in terms of nasal congestion.  Reviewed most recent chest x-ray 08/2020 that reveals clear lungs bilaterally.  Reviewed TTE obtained in the setting of work-up for dyspnea on exertion that demonstrates no significant abnormalities.  PMH: Seasonal allergies, diabetes, hypertension Surgical history: Hysterectomy, carpal tunnel, cholecystectomy, toe surgery Family  history: Mother with diabetes, hypertension, father with diabetes, hypertension, pulmonary fibrosis Social history: Current smoker, lives in [place] / Pulmonary Flowsheets:   ACT:      No data to display           MMRC: mMRC Dyspnea Scale mMRC Score  05/31/2021  3:55 PM 1    Epworth:      No data to display           Tests:   FENO:  No results found for: "NITRICOXIDE"  PFT:    Latest Ref Rng & Units 08/30/2021    2:54 PM  PFT Results  FVC-Pre L 2.40   FVC-Predicted Pre % 96   FVC-Post L 2.35   FVC-Predicted Post % 94   Pre FEV1/FVC % % 89   Post FEV1/FCV % % 88   FEV1-Pre L 2.14   FEV1-Predicted Pre % 108   FEV1-Post L 2.08   DLCO uncorrected ml/min/mmHg 17.74   DLCO UNC% % 94   DLCO corrected ml/min/mmHg 17.74   DLCO COR %Predicted % 94   DLVA Predicted % 107   TLC L 4.02   TLC % Predicted % 85   RV % Predicted % 81   Personally reviewed and interpreted as normal spirometry, no bronchodilator response, lung volumes within normal limits, DLCO within normal limits. WALK:      No data to display  Imaging: Personally reviewed and as per EMR discussion this note  Lab Results: Personally reviewed CBC    Component Value Date/Time   WBC 8.5 11/27/2021 1637   RBC 5.23 (H) 11/27/2021 1637   HGB 14.1 11/27/2021 1637   HCT 44.0 11/27/2021 1637   PLT 319.0 11/27/2021 1637   MCV 84.0 11/27/2021 1637   MCH 26.3 (L) 02/21/2021 1609   MCHC 32.1 11/27/2021 1637   RDW 14.0 11/27/2021 1637   LYMPHSABS 2,348 02/21/2021 1609   MONOABS 0.5 11/10/2020 1348   EOSABS 361 02/21/2021 1609   BASOSABS 60 02/21/2021 1609    BMET    Component Value Date/Time   NA 141 11/27/2021 1637   NA 137 12/01/2018 0000   K 3.4 (L) 11/27/2021 1637   CL 104 11/27/2021 1637   CO2 25 11/27/2021 1637   GLUCOSE 136 (H) 11/27/2021 1637   BUN 19 11/27/2021 1637   BUN 17 12/01/2018 0000   CREATININE 1.09 11/27/2021 1637   CALCIUM 9.5  11/27/2021 1637   GFRNONAA >60 01/23/2021 0923   GFRAA >60 01/15/2019 1026    BNP No results found for: "BNP"  ProBNP No results found for: "PROBNP"  Specialty Problems       Pulmonary Problems   Dyspnea    Allergies  Allergen Reactions   Pollen Extract Swelling   Shellfish Allergy Swelling    Immunization History  Administered Date(s) Administered   Hepatitis A, Adult 07/25/2020, 10/25/2021   Influenza Inj Mdck Quad Pf 04/04/2017   Influenza,inj,Quad PF,6+ Mos 03/03/2018, 02/12/2019, 02/21/2021   Influenza-Unspecified 09/29/2016, 04/11/2017   PFIZER(Purple Top)SARS-COV-2 Vaccination 06/17/2019, 07/07/2019, 04/09/2020, 01/11/2021   PNEUMOCOCCAL CONJUGATE-20 06/08/2021   Pneumococcal Polysaccharide-23 12/05/2017   Td 02/16/2014   Zoster Recombinat (Shingrix) 10/01/2020, 02/21/2021    Past Medical History:  Diagnosis Date   Allergy    Arthritis    Back pain    Carpal tunnel syndrome    Diabetes mellitus without complication ([place])    DVT (deep venous thrombosis) ([place])    after long car trip   Dyspnea    Gastroparesis    GERD (gastroesophageal reflux disease)    H. pylori infection    1997   Heart murmur    Hypertension    Hypertriglyceridemia 02/22/2017   Hypertriglyceridemia 02/22/2017   Migraines    Sickle cell trait (HCC)    Sleep apnea    states she no longer has OSA lost weight    Tobacco History: Social History   Tobacco Use  Smoking Status Never  Smokeless Tobacco Never   Counseling given: Not Answered   Continue to not smoke  Outpatient Encounter Medications as of 01/17/2022  Medication Sig   albuterol (VENTOLIN HFA) 108 (90 Base) MCG/ACT inhaler Inhale 2 puffs into the lungs every 6 (six) hours as needed for wheezing or shortness of breath.   aluminum-magnesium hydroxide-simethicone (MAALOX) 735-329-92 MG/5ML SUSP Take 30 mLs by mouth 3 (three) times daily with meals as needed.   amLODipine (NORVASC) 5 MG tablet Take 1 tablet (5 mg  total) by mouth daily.   Ascorbic Acid (VITAMIN C PO) Take 1 capsule by mouth daily.   atorvastatin (LIPITOR) 20 MG tablet Take 1 tablet (20 mg total) by mouth daily.   Blood Glucose Monitoring Suppl (FREESTYLE LITE) w/Device KIT Use as directed to test blood sugar once a day   Budeson-Glycopyrrol-Formoterol (BREZTRI AEROSPHERE) 160-9-4.8 MCG/ACT AERO Inhale 2 puffs into the lungs in the morning and at bedtime.   carvedilol (COREG) 25 MG  tablet TAKE 1 TABLET BY MOUTH 2 TIMES DAILY WITH A MEAL.   cetirizine (ZYRTEC) 10 MG tablet Take 1 tablet (10 mg total) by mouth daily.   chlorhexidine (PERIDEX) 0.12 % solution 15 mLs 2 (two) times daily.   cholecalciferol (VITAMIN D3) 25 MCG (1000 UNIT) tablet Take 1,000 Units by mouth daily.   Continuous Blood Gluc Receiver (DEXCOM G6 RECEIVER) DEVI Use as directed to check blood sugars   Continuous Blood Gluc Sensor (DEXCOM G6 SENSOR) MISC Check blood sugars as directed   Continuous Blood Gluc Transmit (DEXCOM G6 TRANSMITTER) MISC Check blood sugars as directed   Cyanocobalamin (B-12 SL) Place 1 capsule under the tongue daily.   dapagliflozin propanediol (FARXIGA) 10 MG TABS tablet Take 1 tablet (10 mg total) by mouth daily.   DULoxetine (CYMBALTA) 30 MG capsule Take 1 capsule by mouth once daily for 2 weeks then increase to 2 capsules daily thereafter   fluticasone (FLONASE) 50 MCG/ACT nasal spray Place 2 sprays into both nostrils daily as needed for allergies.   fluticasone-salmeterol (ADVAIR DISKUS) 500-50 MCG/ACT AEPB Inhale 1 puff into the lungs in the morning and at bedtime.   gabapentin (NEURONTIN) 300 MG capsule Take 1 capsule (300 mg total) by mouth at bedtime.   glipiZIDE (GLUCOTROL) 5 MG tablet Take 1/2 tablet (2.5 mg total) by mouth daily before breakfast.   glucose blood test strip Use as directed to test blood sugar daily in the afternoon   hydrochlorothiazide (HYDRODIURIL) 25 MG tablet Take 1 tablet (25 mg total) by mouth daily.   hyoscyamine  (ANASPAZ) 0.125 MG TBDP disintergrating tablet Place 1 tablet (0.125 mg total) under the tongue every 6 (six) hours as needed for cramping.   Lancets (FREESTYLE) lancets Use as directed to test once a day   linaclotide (LINZESS) 145 MCG CAPS capsule Take 1 capsule (145 mcg total) by mouth daily before breakfast.   losartan (COZAAR) 100 MG tablet TAKE 1 TABLET BY MOUTH DAILY *TAKE WITH HCTZ   LOTEMAX 0.5 % ophthalmic suspension Place 1 drop into both eyes 4 (four) times daily as needed for irritation.   metFORMIN (GLUCOPHAGE) 500 MG tablet Take 1 tablet (500 mg total) by mouth daily with breakfast.   mometasone (ELOCON) 0.1 % cream Apply to affected area daily.   Multiple Vitamin (MULTIVITAMIN WITH MINERALS) TABS tablet Take 1 tablet by mouth daily.   Multiple Vitamins-Minerals (ZINC PO) Take 1 tablet by mouth daily.   Omega-3 Fatty Acids (FISH OIL PO) Take 1 capsule by mouth daily.   omeprazole (PRILOSEC) 40 MG capsule Take 1 capsule (40 mg total) by mouth daily.   ondansetron (ZOFRAN-ODT) 4 MG disintegrating tablet Dissolve 1 tablet (4 mg total) by mouth in the morning, at noon, and at bedtime.   Polyvinyl Alcohol-Povidone (CLEAR EYES NATURAL TEARS OP) Place 1 drop into both eyes daily as needed (dry eyes).   potassium chloride SA (KLOR-CON M) 20 MEQ tablet TAKE TWO TABLETS BY MOUTH ON TUESDAY AND SATURDAY AND ONE TABLET ALL OTHER DAYS.   rifaximin (XIFAXAN) 550 MG TABS tablet Take 1 tablet (550 mg total) by mouth 3 (three) times daily. Lot: [id number], Exp:   11-2025   sertraline (ZOLOFT) 50 MG tablet Take 1 tablet (50 mg total) by mouth at bedtime.   topiramate (TOPAMAX) 50 MG tablet Take 1 tablet (50 mg total) by mouth daily.   tretinoin (RETIN-A) 0.025 % cream Apply to affected area at bedtime.   VITAMIN E PO Take 1 capsule by mouth daily.  No facility-administered encounter medications on file as of 01/17/2022.     Review of Systems  Review of Systems  N/a Physical Exam  BP 120/78 (BP  Location: Left Arm, Patient Position: Sitting, Cuff Size: Normal)   Pulse 67   Temp 98.4 F (36.9 C) (Oral)   Wt 187 lb (84.8 kg)   SpO2 96%   BMI 34.48 kg/m   Wt Readings from Last 5 Encounters:  01/17/22 187 lb (84.8 kg)  11/21/21 188 lb (85.3 kg)  10/25/21 193 lb 6.4 oz (87.7 kg)  09/14/21 192 lb (87.1 kg)  09/12/21 193 lb 12.8 oz (87.9 kg)    BMI Readings from Last 5 Encounters:  01/17/22 34.48 kg/m  11/21/21 34.66 kg/m  10/25/21 35.66 kg/m  09/14/21 36.28 kg/m  09/12/21 36.62 kg/m     Physical Exam General: Well-appearing, no acute distress Eyes: EOMI, no icterus Neck: Supple, no JVP Pulmonary: Clear, normal work of breathing Cardiovascular: Regular rhythm, no murmur Abdomen: Nondistended, bowel sounds present MSK: No synovitis, joint effusion Neuro: Normal gait, no weakness Psych: Normal mood, full affect   Assessment & Plan:    [name]: Present for many months to years.  Possibly triggered by viral infection.  History of seasonal allergies.  High suspicion for asthma.  PFTs 08/2021 are normal.  Improvement with inhalers as below.  Asthma: Clinical diagnosis based on worsening symptoms, dyspnea on exertion, seasonal allergies.  Trial high-dose Advair discus twice daily, some improvement in symptoms but still with residual dyspnea.  Particularly with exertion.  Advair was escalated to [facility] 08/2021.  Difficult to ascertain today benefit as she is less active, not working currently.  No changes in medicines today, continue Breztri and albuterol as needed.  Notably, eosinophils as high as 300.    Return in about 6 months (around 07/20/2022).   Lanier Clam, MD 01/17/2022

## 2022-01-24 ENCOUNTER — Encounter (INDEPENDENT_AMBULATORY_CARE_PROVIDER_SITE_OTHER): Payer: Self-pay

## 2022-02-05 ENCOUNTER — Other Ambulatory Visit (HOSPITAL_COMMUNITY): Payer: Self-pay

## 2022-02-05 MED ORDER — DOXYCYCLINE HYCLATE 100 MG PO TABS
ORAL_TABLET | ORAL | 0 refills | Status: DC
  Filled 2022-02-05: qty 14, 7d supply, fill #0

## 2022-02-13 ENCOUNTER — Other Ambulatory Visit (HOSPITAL_COMMUNITY): Payer: Self-pay

## 2022-02-13 ENCOUNTER — Other Ambulatory Visit: Payer: Self-pay | Admitting: Family Medicine

## 2022-02-13 MED ORDER — HYDROCHLOROTHIAZIDE 25 MG PO TABS
25.0000 mg | ORAL_TABLET | Freq: Every day | ORAL | 1 refills | Status: DC
  Filled 2022-02-13: qty 90, 90d supply, fill #0
  Filled 2022-05-22: qty 90, 90d supply, fill #1

## 2022-02-13 MED ORDER — LOSARTAN POTASSIUM 100 MG PO TABS
ORAL_TABLET | ORAL | 1 refills | Status: DC
Start: 2022-02-13 — End: 2022-08-21
  Filled 2022-02-13: qty 90, 90d supply, fill #0
  Filled 2022-05-22: qty 90, 90d supply, fill #1

## 2022-02-14 ENCOUNTER — Other Ambulatory Visit (HOSPITAL_COMMUNITY): Payer: Self-pay

## 2022-02-22 ENCOUNTER — Other Ambulatory Visit: Payer: Self-pay | Admitting: Family Medicine

## 2022-02-22 ENCOUNTER — Ambulatory Visit: Payer: 59 | Admitting: Dietician

## 2022-02-22 ENCOUNTER — Other Ambulatory Visit (HOSPITAL_COMMUNITY): Payer: Self-pay

## 2022-02-22 MED ORDER — DEXCOM G6 TRANSMITTER MISC
0 refills | Status: DC
  Filled 2022-02-22: qty 1, 90d supply, fill #0

## 2022-02-23 ENCOUNTER — Other Ambulatory Visit (HOSPITAL_COMMUNITY): Payer: Self-pay

## 2022-03-08 ENCOUNTER — Encounter: Payer: 59 | Attending: Internal Medicine | Admitting: Dietician

## 2022-03-08 ENCOUNTER — Encounter: Payer: Self-pay | Admitting: Dietician

## 2022-03-08 DIAGNOSIS — E1165 Type 2 diabetes mellitus with hyperglycemia: Secondary | ICD-10-CM | POA: Diagnosis not present

## 2022-03-08 NOTE — Patient Instructions (Addendum)
Resume activity  Zumba after work  Yoga  Walking  Mindfulness:   Am I really hungry for the snack or eating because someone handed me something? How to lighten foods that your granddaughter wants Meal prep Healthy snack options for work  1 [phone]  [employer] Customer Support   Consider 2 Tablespoons of ground flax  daily to help with constipation. Does cheese increase your constipation? Ground flax seeds may also be helpful for your blood pressure along with increased vegetables and fresh fruit and low sodium diet   Breakfast: Add a small amount of carbohydrate with breakfast such as  [place] yogurt  Oatmeal and fruit Grits and egg, fruit Banana and natural peanut butter or handful of nuts.    Add vegetables to your lunch.   Get back to the [employer]

## 2022-03-08 NOTE — Progress Notes (Signed)
Diabetes Self-Management Education  Visit Type: Follow-up  Appt. Start Time: 1545 Appt. End Time: 8127  03/08/2022  Monique Wiggins, identified by name and date of birth, is a 56 y.o. female with a diagnosis of Diabetes:  .   ASSESSMENT Patient is here today alone.  She was last seen by this RD 11/27/2021. Recently moved her mother from [place] to here and sold her house.   They both had cataract surgery.  Patient can now see to drive. 39 yo granddaughter is also in the home currently and food tends to be richer. Her husband is getting ready to have the gastric sleeve and she plans on following guidelines with him.  He continues to push food and bring sweets etc. Verbalized need to restart [employer] exercise classes. Reviewed Dexcom CGM report:  87% time in range, 13% high over the past 2 weeks  History includes Type 2 diabetes, nausea after gallbladder removed 02/2021, HTN, HLD, OSA (dx years ago and no longer uses c-pap)   Poor sleep (3 hours per night), wakes frequently to use the restroom Back pain A1C  7.3% 08/31/2021, 7.1% 01/23/2021, cholesterol 106, HDL 45, Triglycerides 234 10/2020, GFR 62 08/31/2021  Medications include:  Farxiga, Metformin, glipizide, Vitamin B-12, vitamin D3, omega 3, zinc, vitamin C CDG:  Dexcom G6   Weight hx: 189 lbs 03/06/2022 183 lbs 01/2022 193 lbs 07/20/2021 192 lbs 05/18/2021 187 lbs 03/15/2021 171 lbs 01/11/2020 155 lbs lowest adult weight 2018 195 lbs highest adult weight  10/17/2017   Patient lives with her husband.  They share shopping and cooking.  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  She is a Geophysicist/field seismologist (babies and dogs). Allergic to shellfish Lactose intolerant Spinach and all greens causes diarrhea.  Lettuce does not cause symptoms. Doesn't tolerate raw onions Her husband is very supportive but enjoys feeding her and likes to snack at night. Increased stress this year. There were no vitals taken  for this visit. There is no height or weight on file to calculate BMI.   Diabetes Self-Management Education - 03/08/22 1720       Visit Information   Visit Type Follow-up      Psychosocial Assessment   Patient Belief/Attitude about Diabetes Motivated to manage diabetes    What is the hardest part about your diabetes right now, causing you the most concern, or is the most worrisome to you about your diabetes?   Making healty food and beverage choices;Getting support / problem solving    Self-care barriers Other (comment);None   stress, caring for mother   Self-management support Doctor's office;CDE visits    Other persons present Patient    Patient Concerns Nutrition/Meal planning;Problem Solving    Special Needs None    Preferred Learning Style No preference indicated    Learning Readiness Ready      Pre-Education Assessment   Patient understands the diabetes disease and treatment process. Demonstrates understanding / competency    Patient understands incorporating nutritional management into lifestyle. Comprehends key points    Patient undertands incorporating physical activity into lifestyle. Comprehends key points    Patient understands using medications safely. Demonstrates understanding / competency    Patient understands monitoring blood glucose, interpreting and using results Demonstrates understanding / competency    Patient understands prevention, detection, and treatment of acute complications. Demonstrates understanding / competency    Patient understands prevention, detection, and treatment of chronic complications. Demonstrates understanding / competency    Patient  understands how to develop strategies to address psychosocial issues. Demonstrates understanding / competency    Patient understands how to develop strategies to promote health/change behavior. Comprehends key points      Complications   How often do you check your blood sugar? > 4 times/day    Fasting Blood  glucose range (mg/dL) 130-179;180-200    Number of hypoglycemic episodes per month 0    Number of hyperglycemic episodes ( >'200mg'$ /dL): Occasional      Dietary Intake   Breakfast banana    Lunch chicken salad, chips    Snack (afternoon) nabs    Dinner chicken, rice with white sauce, vegetables    Snack (evening) 4-5 small heath bar candy    Beverage(s) water, juice broth      Activity / Exercise   Activity / Exercise Type Light (walking / raking leaves)   work related     Patient Education   Previous Diabetes Education Yes (please comment)   11/2021   Healthy Eating Meal options for control of blood glucose level and chronic complications.    Being Active Role of exercise on diabetes management, blood pressure control and cardiac health.    Medications Reviewed patients medication for diabetes, action, purpose, timing of dose and side effects.    Monitoring Taught/evaluated CGM (comment)      Individualized Goals (developed by patient)   Nutrition General guidelines for healthy choices and portions discussed    Physical Activity Exercise 3-5 times per week;45 minutes per day    Medications take my medication as prescribed    Monitoring  Consistenly use CGM    Problem Solving Eating Pattern;Addressing barriers to behavior change    Reducing Risk examine blood glucose patterns      Patient Self-Evaluation of Goals - Patient rates self as meeting previously set goals (% of time)   Nutrition 50 - 75 % (half of the time)    Physical Activity < 25% (hardly ever/never)    Medications >75% (most of the time)    Monitoring >75% (most of the time)    Problem Solving and behavior change strategies  >75% (most of the time)    Reducing Risk (treating acute and chronic complications) >66% (most of the time)    Health Coping 50 - 75 % (half of the time)      Post-Education Assessment   Patient understands the diabetes disease and treatment process. Demonstrates understanding / competency     Patient understands incorporating nutritional management into lifestyle. Comprehends key points    Patient undertands incorporating physical activity into lifestyle. Demonstrates understanding / competency    Patient understands using medications safely. Demonstrates understanding / competency    Patient understands monitoring blood glucose, interpreting and using results Demonstrates understanding / competency    Patient understands prevention, detection, and treatment of acute complications. Demonstrates understanding / competency    Patient understands prevention, detection, and treatment of chronic complications. Demonstrates understanding / competency    Patient understands how to develop strategies to address psychosocial issues. Demonstrates understanding / competency    Patient understands how to develop strategies to promote health/change behavior. Comprehends key points      Outcomes   Expected Outcomes Demonstrated interest in learning. Expect positive outcomes    Future DMSE 3-4 months    Program Status Not Completed      Subsequent Visit   Since your last visit have you continued or begun to take your medications as prescribed? Yes    Since  your last visit have you experienced any weight changes? Loss    Weight Loss (lbs) 6             Individualized Plan for Diabetes Self-Management Training:   Learning Objective:  Patient will have a greater understanding of diabetes self-management. Patient education plan is to attend individual and/or group sessions per assessed needs and concerns.   Plan:   Patient Instructions  Resume activity  Zumba after work  Yoga  Walking  Mindfulness:   Am I really hungry for the snack or eating because someone handed me something? How to lighten foods that your granddaughter wants Meal prep Healthy snack options for work  1 [phone]  [employer] Customer Support   Consider 2 Tablespoons of ground flax  daily to help with  constipation. Does cheese increase your constipation? Ground flax seeds may also be helpful for your blood pressure along with increased vegetables and fresh fruit and low sodium diet   Breakfast: Add a small amount of carbohydrate with breakfast such as  [place] yogurt  Oatmeal and fruit Grits and egg, fruit Banana and natural peanut butter or handful of nuts.    Add vegetables to your lunch.   Get back to the Zumba   Ezekiel bread  Expected Outcomes:  Demonstrated interest in learning. Expect positive outcomes  Education material provided:   If problems or questions, patient to contact team via:  Phone  Future DSME appointment: 3-4 months

## 2022-03-12 ENCOUNTER — Other Ambulatory Visit (HOSPITAL_COMMUNITY): Payer: Self-pay

## 2022-03-13 ENCOUNTER — Emergency Department (HOSPITAL_BASED_OUTPATIENT_CLINIC_OR_DEPARTMENT_OTHER): Payer: 59

## 2022-03-13 ENCOUNTER — Encounter (HOSPITAL_BASED_OUTPATIENT_CLINIC_OR_DEPARTMENT_OTHER): Payer: Self-pay | Admitting: Pediatrics

## 2022-03-13 ENCOUNTER — Other Ambulatory Visit: Payer: Self-pay

## 2022-03-13 ENCOUNTER — Emergency Department (HOSPITAL_BASED_OUTPATIENT_CLINIC_OR_DEPARTMENT_OTHER)
Admission: EM | Admit: 2022-03-13 | Discharge: 2022-03-13 | Disposition: A | Discharge status: Home / Self Care | Payer: 59 | Source: Home / Self Care | Attending: Emergency Medicine | Admitting: Emergency Medicine

## 2022-03-13 ENCOUNTER — Emergency Department (HOSPITAL_COMMUNITY)
Admission: EM | Admit: 2022-03-13 | Discharge: 2022-03-13 | Discharge status: Against Medical Advice | Payer: 59 | Attending: Emergency Medicine | Admitting: Emergency Medicine

## 2022-03-13 ENCOUNTER — Encounter (HOSPITAL_COMMUNITY): Payer: Self-pay

## 2022-03-13 DIAGNOSIS — R197 Diarrhea, unspecified: Secondary | ICD-10-CM | POA: Insufficient documentation

## 2022-03-13 DIAGNOSIS — Z20822 Contact with and (suspected) exposure to covid-19: Secondary | ICD-10-CM | POA: Insufficient documentation

## 2022-03-13 DIAGNOSIS — R112 Nausea with vomiting, unspecified: Secondary | ICD-10-CM | POA: Insufficient documentation

## 2022-03-13 DIAGNOSIS — R1013 Epigastric pain: Secondary | ICD-10-CM

## 2022-03-13 DIAGNOSIS — R0789 Other chest pain: Secondary | ICD-10-CM | POA: Diagnosis not present

## 2022-03-13 DIAGNOSIS — R42 Dizziness and giddiness: Secondary | ICD-10-CM | POA: Insufficient documentation

## 2022-03-13 DIAGNOSIS — R072 Precordial pain: Secondary | ICD-10-CM | POA: Insufficient documentation

## 2022-03-13 DIAGNOSIS — R110 Nausea: Secondary | ICD-10-CM | POA: Diagnosis not present

## 2022-03-13 DIAGNOSIS — R200 Anesthesia of skin: Secondary | ICD-10-CM | POA: Insufficient documentation

## 2022-03-13 DIAGNOSIS — Z5321 Procedure and treatment not carried out due to patient leaving prior to being seen by health care provider: Secondary | ICD-10-CM | POA: Insufficient documentation

## 2022-03-13 DIAGNOSIS — R1084 Generalized abdominal pain: Secondary | ICD-10-CM | POA: Insufficient documentation

## 2022-03-13 DIAGNOSIS — R079 Chest pain, unspecified: Secondary | ICD-10-CM | POA: Diagnosis not present

## 2022-03-13 DIAGNOSIS — E119 Type 2 diabetes mellitus without complications: Secondary | ICD-10-CM | POA: Insufficient documentation

## 2022-03-13 LAB — COMPREHENSIVE METABOLIC PANEL
ALT: 45 U/L — ABNORMAL HIGH (ref 0–44)
AST: 46 U/L — ABNORMAL HIGH (ref 15–41)
Albumin: 4.5 g/dL (ref 3.5–5.0)
Alkaline Phosphatase: 98 U/L (ref 38–126)
Anion gap: 8 (ref 5–15)
BUN: 22 mg/dL — ABNORMAL HIGH (ref 6–20)
CO2: 22 mmol/L (ref 22–32)
Calcium: 9.6 mg/dL (ref 8.9–10.3)
Chloride: 106 mmol/L (ref 98–111)
Creatinine, Ser: 1.27 mg/dL — ABNORMAL HIGH (ref 0.44–1.00)
GFR, Estimated: 49 mL/min — ABNORMAL LOW (ref >60)
Glucose, Bld: 147 mg/dL — ABNORMAL HIGH (ref 70–99)
Potassium: 3.9 mmol/L (ref 3.5–5.1)
Sodium: 136 mmol/L (ref 135–145)
Total Bilirubin: 0.8 mg/dL (ref 0.3–1.2)
Total Protein: 8.1 g/dL (ref 6.5–8.1)

## 2022-03-13 LAB — URINALYSIS, ROUTINE W REFLEX MICROSCOPIC
Bacteria, UA: NONE SEEN
Bilirubin Urine: NEGATIVE
Glucose, UA: >=500 mg/dL — AB
Hgb urine dipstick: NEGATIVE
Ketones, ur: NEGATIVE mg/dL
Nitrite: NEGATIVE
Protein, ur: NEGATIVE mg/dL
Specific Gravity, Urine: 1.023 (ref 1.005–1.030)
pH: 5 (ref 5.0–8.0)

## 2022-03-13 LAB — CBC WITH DIFFERENTIAL/PLATELET
Abs Immature Granulocytes: 0.03 10*3/uL (ref 0.00–0.07)
Basophils Absolute: 0.1 10*3/uL (ref 0.0–0.1)
Basophils Relative: 1 %
Eosinophils Absolute: 0.2 10*3/uL (ref 0.0–0.5)
Eosinophils Relative: 2 %
HCT: 48.4 % — ABNORMAL HIGH (ref 36.0–46.0)
Hemoglobin: 15.3 g/dL — ABNORMAL HIGH (ref 12.0–15.0)
Immature Granulocytes: 0 %
Lymphocytes Relative: 22 %
Lymphs Abs: 1.8 10*3/uL (ref 0.7–4.0)
MCH: 27.1 pg (ref 26.0–34.0)
MCHC: 31.6 g/dL (ref 30.0–36.0)
MCV: 85.8 fL (ref 80.0–100.0)
Monocytes Absolute: 0.5 10*3/uL (ref 0.1–1.0)
Monocytes Relative: 6 %
Neutro Abs: 5.6 10*3/uL (ref 1.7–7.7)
Neutrophils Relative %: 69 %
Platelets: 379 10*3/uL (ref 150–400)
RBC: 5.64 MIL/uL — ABNORMAL HIGH (ref 3.87–5.11)
RDW: 15 % (ref 11.5–15.5)
WBC: 8.1 10*3/uL (ref 4.0–10.5)
nRBC: 0 % (ref 0.0–0.2)

## 2022-03-13 LAB — TROPONIN I (HIGH SENSITIVITY)
Troponin I (High Sensitivity): 6 ng/L (ref <18)
Troponin I (High Sensitivity): 7 ng/L (ref <18)

## 2022-03-13 LAB — CBG MONITORING, ED: Glucose-Capillary: 144 mg/dL — ABNORMAL HIGH (ref 70–99)

## 2022-03-13 LAB — RESP PANEL BY RT-PCR (FLU A&B, COVID) ARPGX2
Influenza A by PCR: NEGATIVE
Influenza B by PCR: NEGATIVE
SARS Coronavirus 2 by RT PCR: NEGATIVE

## 2022-03-13 LAB — LIPASE, BLOOD: Lipase: 34 U/L (ref 11–51)

## 2022-03-13 MED ORDER — ONDANSETRON 4 MG PO TBDP
4.0000 mg | ORAL_TABLET | Freq: Three times a day (TID) | ORAL | 0 refills | Status: AC | PRN
  Filled 2022-03-13: qty 20, 7d supply, fill #0

## 2022-03-13 MED ORDER — DICYCLOMINE HCL 20 MG PO TABS
20.0000 mg | ORAL_TABLET | Freq: Three times a day (TID) | ORAL | 0 refills | Status: AC | PRN
  Filled 2022-03-13: qty 20, 7d supply, fill #0

## 2022-03-13 MED ORDER — LOPERAMIDE HCL 2 MG PO CAPS
2.0000 mg | ORAL_CAPSULE | Freq: Four times a day (QID) | ORAL | 0 refills | Status: AC | PRN
  Filled 2022-03-13: qty 12, 3d supply, fill #0

## 2022-03-13 MED ORDER — ONDANSETRON HCL 4 MG/2ML IJ SOLN
4.0000 mg | Freq: Once | INTRAMUSCULAR | Status: AC
  Administered 2022-03-13: 4 mg via INTRAVENOUS
  Filled 2022-03-13: qty 2

## 2022-03-13 MED ORDER — SODIUM CHLORIDE 0.9 % IV BOLUS
500.0000 mL | Freq: Once | INTRAVENOUS | Status: AC
  Administered 2022-03-13: 500 mL via INTRAVENOUS

## 2022-03-13 MED ORDER — LIDOCAINE-EPINEPHRINE-TETRACAINE (LET) TOPICAL GEL
3.0000 mL | Freq: Once | TOPICAL | Status: AC
  Administered 2022-03-13: 3 mL via TOPICAL
  Filled 2022-03-13: qty 3

## 2022-03-13 NOTE — ED Provider Triage Note (Signed)
Emergency Medicine Provider Triage Evaluation Note  Monique Wiggins , a 56 y.o. female  was evaluated in triage.  Pt complains of generalized abdominal pain, nausea, vomiting, and diarrhea that started yesterday.  She also admits to dizziness and lightheadedness.  No sick contacts.  Review of Systems  Positive: Abdominal pain Negative: CP  Physical Exam  BP 123/73 (BP Location: Left Arm)   Pulse 60   Temp 98 F (36.7 C) (Oral)   Resp 10   SpO2 93%  Gen:   Awake, no distress   Resp:  Normal effort  MSK:   Moves extremities without difficulty  Other:  Diffuse tenderness  Medical Decision Making  Medically screening exam initiated at 10:29 AM.  Appropriate orders placed.  Monique Wiggins was informed that the remainder of the evaluation will be completed by another provider, this initial triage assessment does not replace that evaluation, and the importance of remaining in the ED until their evaluation is complete.  Labs COVID   Suzy Bouchard, [place] 03/13/22 [phone]

## 2022-03-13 NOTE — ED Notes (Signed)
2 unsuccessful IV sticks.  

## 2022-03-13 NOTE — ED Provider Notes (Signed)
Emergency Department Provider Note   I have reviewed the triage vital signs and the nursing notes.   HISTORY  Chief Complaint Chest Pain, Nausea, Emesis, Diarrhea, and Lip Numbness   HPI Monique Wiggins is a 56 y.o. female with PMH reviewed presents to the ED with diarrhea with nausea and vomiting. Symptoms present for the last 2 days. She had some associated fatigue and perioral tingling earlier along with report of CP. No SOB. No fever or chills. She went to the [facility] for evaluation and had triage labs with [facility] but [facility]. Arrives here for evaluation and mgmt.    Past Medical History:  Diagnosis Date   Allergy    Arthritis    Back pain    Carpal tunnel syndrome    Diabetes mellitus without complication ([place])    DVT (deep venous thrombosis) ([place])    after [place] car trip   Dyspnea    Gastroparesis    GERD (gastroesophageal reflux disease)    H. pylori infection    1997   Heart murmur    Hypertension    Hypertriglyceridemia 02/22/2017   Hypertriglyceridemia 02/22/2017   Migraines    Sickle cell trait (HCC)    Sleep apnea    states she no longer has OSA lost weight    Review of Systems  Constitutional: No fever/chills. Positive weakness.  Eyes: No visual changes. ENT: No sore throat. Cardiovascular: Positive chest pain. Respiratory: Denies shortness of breath. Gastrointestinal: Positive abdominal pain.  Positive nausea, vomiting, and diarrhea.  No constipation. Genitourinary: Negative for dysuria. Musculoskeletal: Negative for back pain. Skin: Negative for rash. Neurological: Negative for headaches, focal weakness or numbness. Perioral tingling (bilateral earlier).   ____________________________________________   PHYSICAL EXAM:  VITAL SIGNS: ED Triage Vitals  Enc Vitals Group     BP 03/13/22 1700 (!) 152/88     Pulse Rate 03/13/22 1700 78     Resp 03/13/22 1700 20     Temp 03/13/22 1700 98 F (36.7 C)     Temp Source 03/13/22 1700 Oral     SpO2  03/13/22 1700 100 %     Weight 03/13/22 1650 180 lb (81.6 kg)     Height 03/13/22 1650 5' 1.75" (1.568 m)   Constitutional: Alert and oriented. Well appearing and in no acute distress. Eyes: Conjunctivae are normal.  Head: Atraumatic. Nose: No congestion/rhinnorhea. Mouth/Throat: Mucous membranes are slightly dry.  Neck: No stridor.   Cardiovascular: Normal rate, regular rhythm. Good peripheral circulation. Grossly normal heart sounds.   Respiratory: Normal respiratory effort.  No retractions. Lungs CTAB. Gastrointestinal: Soft with mild epigastric tenderness. No RUQ or lower abdominal tenderness. No distention.  Musculoskeletal: No lower extremity tenderness nor edema. No gross deformities of extremities. Neurologic:  Normal speech and language. No gross focal neurologic deficits are appreciated. Normal strength and sensation throughout with no facial asymmetry.  Skin:  Skin is warm, dry and intact. No rash noted.   ____________________________________________   LABS (all labs ordered are listed, but only abnormal results are displayed)  Labs Reviewed  TROPONIN I (HIGH SENSITIVITY)  TROPONIN I (HIGH SENSITIVITY)   ____________________________________________  EKG   EKG Interpretation  Date/Time:  Tuesday March 13 2022 16:53:10 EDT Ventricular Rate:  65 PR Interval:  152 QRS Duration: 66 QT Interval:  428 QTC Calculation: 445 R Axis:   43 Text Interpretation: Normal sinus rhythm Low voltage QRS Cannot rule out Anterior infarct , age undetermined Abnormal ECG When compared with ECG of 13-Mar-2022 10:14, PREVIOUS  ECG IS PRESENT Confirmed by Nanda Quinton [phone]) on 03/13/2022 5:18:08 PM        ____________________________________________  RADIOLOGY  DG Chest 2 View  Result Date: 03/13/2022 CLINICAL DATA:  Chest pain.  Nausea and diarrhea. EXAM: CHEST - 2 VIEW COMPARISON:  None Available. FINDINGS: The heart size and mediastinal contours are within normal limits.  Both lungs are clear. The visualized skeletal structures are unremarkable. IMPRESSION: No active cardiopulmonary disease. Electronically Signed   By: Kerby Moors M.D.   On: 03/13/2022 17:13    ____________________________________________   PROCEDURES  Procedure(s) performed:   Procedures  None ____________________________________________   INITIAL IMPRESSION / ASSESSMENT AND PLAN / ED COURSE  Pertinent labs & imaging results that were available during my care of the patient were reviewed by me and considered in my medical decision making (see chart for details).   This patient is Presenting for Evaluation of CP, which does require a range of treatment options, and is a complaint that involves a high risk of morbidity and mortality.  The Differential Diagnoses includes but is not exclusive to acute coronary syndrome, aortic dissection, pulmonary embolism, cardiac tamponade, community-acquired pneumonia, pericarditis, musculoskeletal chest wall pain, etc.   Critical Interventions-    Medications  sodium chloride 0.9 % bolus 500 mL (0 mLs Intravenous Stopped 03/13/22 2233)  ondansetron (ZOFRAN) injection 4 mg (4 mg Intravenous Given 03/13/22 2121)  lidocaine-EPINEPHrine-tetracaine (LET) topical gel (3 mLs Topical Given 03/13/22 2022)  lidocaine-EPINEPHrine-tetracaine (LET) topical gel (3 mLs Topical Given 03/13/22 2124)    Reassessment after intervention: Nausea and fatigue improved after IVF.    I did obtain Additional Historical Information from husband.   I decided to review pertinent External Data, and in summary labs from [facility] reviewed.   Clinical Laboratory Tests Ordered, included Troponin negative. No AKI or severe electrolyte derangement from earlier.   Radiologic Tests Ordered, included CXR. I independently interpreted the images and agree with radiology interpretation.   Cardiac Monitor Tracing which shows NSR.    Social Determinants of Health Risk patient is a  non-smoker.   Medical Decision Making: Summary:  Patient presents to the ED with multiple symptoms. No ischemic change on EKG and troponin is negative. Normal CXR. Labs from outside ED without acute findings. Patient with perioral tingling but no clear CVA symptoms or concerning exam findings. Feeling improved on reassessment after IVF. Seems most consistent with viral process. Plan for supportive care and PCP follow up plan with strict ED return precautions.   Considered admission but symptoms are improved and labs imaging reassuring.   Disposition: discahrge  ____________________________________________  FINAL CLINICAL IMPRESSION(S) / ED DIAGNOSES  Final diagnoses:  Nausea vomiting and diarrhea  Precordial chest pain  Abdominal pain, epigastric     NEW OUTPATIENT MEDICATIONS STARTED DURING THIS VISIT:  Discharge Medication List as of 03/13/2022 10:40 PM     START taking these medications   Details  dicyclomine (BENTYL) 20 MG tablet Take 1 tablet (20 mg total) by mouth 3 (three) times daily as needed., Starting Tue 03/13/2022, Normal    loperamide (IMODIUM) 2 MG capsule Take 1 capsule (2 mg total) by mouth 4 (four) times daily as needed for diarrhea or loose stools., Starting Tue 03/13/2022, Normal        Note:  This document was prepared using Dragon voice recognition software and may include unintentional dictation errors.  Nanda Quinton, MD, [facility] Emergency Medicine    Steffany Schoenfelder, Wonda [facility], MD 03/19/22 [phone]

## 2022-03-13 NOTE — Discharge Instructions (Signed)
Seen in the emergency room today with vomiting and diarrhea.  I suspect this is causing some dehydration and discomfort in your abdomen.  I am calling in some medications to help with your symptoms and will have you mainly focus on fluid intake over the next several days.  Please return to the emergency department any sudden worsening of symptoms.  Please obtain repeat blood work in the next week with your primary care doctor as scheduled.

## 2022-03-13 NOTE — ED Triage Notes (Signed)
Pt arrived via POV, c/o abd pain, diarrhea, states dizzy and lightheaded.

## 2022-03-13 NOTE — ED Triage Notes (Signed)
C/O numbness of lips around 8:30 am and somewhat progressed; denies any unilateral weakness; negative facial droop or slurred speech; ambulated in triage with no issues.   stated chest pain started 3 hrs ago;  c/o diarrhea x 2 days;   NV started today;   Reported was seen at [facility] and was not able to wait.

## 2022-03-14 ENCOUNTER — Other Ambulatory Visit (HOSPITAL_COMMUNITY): Payer: Self-pay

## 2022-03-15 ENCOUNTER — Ambulatory Visit: Payer: 59 | Admitting: Family

## 2022-03-20 ENCOUNTER — Ambulatory Visit: Payer: 59 | Admitting: Family

## 2022-03-20 VITALS — BP 128/80 | HR 74 | Temp 98.5°F | Resp 18 | Ht 61.75 in | Wt 189.8 lb

## 2022-03-20 DIAGNOSIS — E860 Dehydration: Secondary | ICD-10-CM | POA: Diagnosis not present

## 2022-03-20 DIAGNOSIS — K529 Noninfective gastroenteritis and colitis, unspecified: Secondary | ICD-10-CM

## 2022-03-20 NOTE — Progress Notes (Signed)
/  Subjective:   By signing my name below, I, Kellie Simmering, attest that this documentation has been prepared under the direction and in the presence of Debbrah Alar, NP 03/20/2022.  Patient ID: Monique Wiggins, female    DOB: 1965-12-04, 56 y.o.   MRN: [id number]  Chief Complaint  Patient presents with   ER follow up    03/13/22: abd pain, n/v, and diarrhea   HPI Patient is in today for an office visit.  ER Visit: She reports that she was admitted to the ER on 09/26 for diarrhea, nausea, and vomiting. She further reports that she did have diarrhea for 3 days after the visit, but it has since subsided. She was diagnosed with a viral illness. She is doing well during today's visit and denies any previous symptoms. BM's are formed and she is eating and drinking normally.   Immunizations: She has been informed about receiving COVID-19 and Flu immunizations. She will get flu shot from her employer.   Past Medical History:  Diagnosis Date   Allergy    Arthritis    Back pain    Carpal tunnel syndrome    Diabetes mellitus without complication ([place])    DVT (deep venous thrombosis) ([place])    after long car trip   Dyspnea    Gastroparesis    GERD (gastroesophageal reflux disease)    H. pylori infection    1997   Heart murmur    Hypertension    Hypertriglyceridemia 02/22/2017   Hypertriglyceridemia 02/22/2017   Migraines    Sickle cell trait (HCC)    Sleep apnea    states she no longer has OSA lost weight   Past Surgical History:  Procedure Laterality Date   ABDOMINAL HYSTERECTOMY     ovaries left in place   CARPAL TUNNEL RELEASE Right 01/19/2019   Procedure: ENDOSCOPIC CARPAL TUNNEL RELEASE;  Surgeon: Milly Jakob, MD;  Location: [place];  Service: Orthopedics;  Laterality: Right;   CARPOMETACARPEL SUSPENSION PLASTY Right 01/19/2019   Procedure: RIGHT THUMB SUSPENSION ARTHROPLASTY;  Surgeon: Milly Jakob, MD;  Location: [place];  Service: Orthopedics;  Laterality: Right;  PREOP BLOCK   CHOLECYSTECTOMY  01/24/2021   TOE SURGERY     left great toe, repair of severed tendon   TONSILLECTOMY     tummy tuck     Family History  Problem Relation Age of Onset   Diabetes type II Mother    High blood pressure Mother    Diabetes Mellitus II Father    Pulmonary fibrosis Father    Hypertension Father    Pulmonary disease Father    High Cholesterol Father    Hypertension Sister    Diabetes Sister    Stomach cancer Maternal Grandmother        possibility that this was colon cancer instead-patient and her mother have differing recollection   Cancer Maternal Grandfather        lung, smoker   Allergic Disorder Son    Hyperthyroidism Son    Allergic Disorder Son    Asthma Son    Esophageal cancer Neg Hx    Pancreatic cancer Neg Hx    Rectal cancer Neg Hx    Colon cancer Neg Hx    Social History   Socioeconomic History   Marital status: Married    Spouse name: Trilby Drummer   Number of children: Not on file   Years of education: Not on file   Highest education level: Not on  file  Occupational History   Occupation: Patient [employer]  Tobacco Use   Smoking status: Never   Smokeless tobacco: Never  Vaping Use   Vaping Use: Never used  Substance and Sexual Activity   Alcohol use: Never    Comment: never   Drug use: No   Sexual activity: Yes    Birth control/protection: Surgical  Other Topics Concern   Not on file  Social History Narrative   ** Merged History Encounter **       Married   5 children (4 boys and 1 girl) All grown, has one daughter in [place], son in [place], son in [place], one in [place], youngest in Nature conservation officer in [place]   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   Enjoys carpentry, Armed forces logistics/support/administrative officer, Materials engineer      Lactose intolerant   Social Determinants of Radio broadcast assistant Strain: Not on file  Food Insecurity: Not  on file  Transportation Needs: Not on file  Physical Activity: Not on file  Stress: Not on file  Social Connections: Not on file  Intimate Partner Violence: Not on file   Outpatient Medications Prior to Visit  Medication Sig Dispense Refill   albuterol (VENTOLIN HFA) 108 (90 Base) MCG/ACT inhaler Inhale 2 puffs into the lungs every 6 (six) hours as needed for wheezing or shortness of breath. 54 g 1   aluminum-magnesium hydroxide-simethicone (MAALOX) 829-562-13 MG/5ML SUSP Take 30 mLs by mouth 3 (three) times daily with meals as needed. 355 mL 0   amLODipine (NORVASC) 5 MG tablet Take 1 tablet (5 mg total) by mouth daily. 90 tablet 1   Ascorbic Acid (VITAMIN C PO) Take 1 capsule by mouth daily.     atorvastatin (LIPITOR) 20 MG tablet Take 1 tablet (20 mg total) by mouth daily. 90 tablet 1   Blood Glucose Monitoring Suppl (FREESTYLE LITE) w/Device KIT Use as directed to test blood sugar once a day 1 kit 0   Budeson-Glycopyrrol-Formoterol (BREZTRI AEROSPHERE) 160-9-4.8 MCG/ACT AERO Inhale 2 puffs into the lungs in the morning and at bedtime. 10.7 g 0   carvedilol (COREG) 25 MG tablet TAKE 1 TABLET BY MOUTH 2 TIMES DAILY WITH A MEAL. 180 tablet 1   cetirizine (ZYRTEC) 10 MG tablet Take 1 tablet (10 mg total) by mouth daily. 90 tablet 1   chlorhexidine (PERIDEX) 0.12 % solution 15 mLs 2 (two) times daily.     cholecalciferol (VITAMIN D3) 25 MCG (1000 UNIT) tablet Take 1,000 Units by mouth daily.     Continuous Blood Gluc Receiver (DEXCOM G6 RECEIVER) DEVI Use as directed to check blood sugars 1 each 0   Continuous Blood Gluc Sensor (DEXCOM G6 SENSOR) MISC Check blood sugars as directed 3 each 3   Continuous Blood Gluc Transmit (DEXCOM G6 TRANSMITTER) MISC Check blood sugars as directed 1 each 0   Cyanocobalamin (B-12 SL) Place 1 capsule under the tongue daily.     dapagliflozin propanediol (FARXIGA) 10 MG TABS tablet Take 1 tablet (10 mg total) by mouth daily. 90 tablet 3   dicyclomine (BENTYL)  20 MG tablet Take 1 tablet (20 mg total) by mouth 3 (three) times daily as needed. 20 tablet 0   doxycycline (VIBRA-TABS) 100 MG tablet Take 1 tablet by mouth twice daily. 14 tablet 0   DULoxetine (CYMBALTA) 30 MG capsule Take 1 capsule by mouth once daily for 2 weeks then increase  to 2 capsules daily thereafter 180 capsule 1   fluticasone (FLONASE) 50 MCG/ACT nasal spray Place 2 sprays into both nostrils daily as needed for allergies. 48 g 1   fluticasone-salmeterol (ADVAIR DISKUS) 500-50 MCG/ACT AEPB Inhale 1 puff into the lungs in the morning and at bedtime. 60 each 3   gabapentin (NEURONTIN) 300 MG capsule Take 1 capsule (300 mg total) by mouth at bedtime. 90 capsule 1   glipiZIDE (GLUCOTROL) 5 MG tablet Take 1/2 tablet (2.5 mg total) by mouth daily before breakfast. 45 tablet 3   glucose blood test strip Use as directed to test blood sugar daily in the afternoon 100 each 3   hydrochlorothiazide (HYDRODIURIL) 25 MG tablet Take 1 tablet (25 mg total) by mouth daily. 90 tablet 1   hyoscyamine (ANASPAZ) 0.125 MG TBDP disintergrating tablet Place 1 tablet (0.125 mg total) under the tongue every 6 (six) hours as needed for cramping. 60 tablet 2   Lancets (FREESTYLE) lancets Use as directed to test once a day 100 each 3   linaclotide (LINZESS) 145 MCG CAPS capsule Take 1 capsule (145 mcg total) by mouth daily before breakfast. 30 capsule 2   loperamide (IMODIUM) 2 MG capsule Take 1 capsule (2 mg total) by mouth 4 (four) times daily as needed for diarrhea or loose stools. 12 capsule 0   losartan (COZAAR) 100 MG tablet TAKE 1 TABLET BY MOUTH DAILY *TAKE WITH HCTZ 90 tablet 1   LOTEMAX 0.5 % ophthalmic suspension Place 1 drop into both eyes 4 (four) times daily as needed for irritation.     metFORMIN (GLUCOPHAGE) 500 MG tablet Take 1 tablet (500 mg total) by mouth daily with breakfast. 90 tablet 3   mometasone (ELOCON) 0.1 % cream Apply to affected area daily. 135 g 1   Multiple Vitamin (MULTIVITAMIN  WITH MINERALS) TABS tablet Take 1 tablet by mouth daily.     Multiple Vitamins-Minerals (ZINC PO) Take 1 tablet by mouth daily.     Omega-3 Fatty Acids (FISH OIL PO) Take 1 capsule by mouth daily.     omeprazole (PRILOSEC) 40 MG capsule Take 1 capsule (40 mg total) by mouth daily. 90 capsule 1   ondansetron (ZOFRAN-ODT) 4 MG disintegrating tablet Dissolve 1 tablet (4 mg total) by mouth every 8 (eight) hours as needed. 20 tablet 0   Polyvinyl Alcohol-Povidone (CLEAR EYES NATURAL TEARS OP) Place 1 drop into both eyes daily as needed (dry eyes).     potassium chloride SA (KLOR-CON M) 20 MEQ tablet TAKE TWO TABLETS BY MOUTH ON TUESDAY AND SATURDAY AND ONE TABLET ALL OTHER DAYS. 114 tablet 1   rifaximin (XIFAXAN) 550 MG TABS tablet Take 1 tablet (550 mg total) by mouth 3 (three) times daily. Lot: [id number], Exp:   11-2025 42 tablet 0   sertraline (ZOLOFT) 50 MG tablet Take 1 tablet (50 mg total) by mouth at bedtime. 90 tablet 1   topiramate (TOPAMAX) 50 MG tablet Take 1 tablet (50 mg total) by mouth daily. 90 tablet 1   tretinoin (RETIN-A) 0.025 % cream Apply to affected area at bedtime. 135 g 1   VITAMIN E PO Take 1 capsule by mouth daily.     No facility-administered medications prior to visit.   Allergies  Allergen Reactions   Pollen Extract Swelling   Shellfish Allergy Swelling   Review of Systems  Gastrointestinal:  Negative for diarrhea, nausea and vomiting.      Objective:    Physical Exam Constitutional:  General: She is not in acute distress.    Appearance: Normal appearance. She is not ill-appearing.  HENT:     Head: Normocephalic and atraumatic.     Right Ear: External ear normal.     Left Ear: External ear normal.     Mouth/Throat:     Mouth: Mucous membranes are moist.     Pharynx: Oropharynx is clear.  Eyes:     Extraocular Movements: Extraocular movements intact.     Pupils: Pupils are equal, round, and reactive to light.  Cardiovascular:     Rate and Rhythm: Normal  rate and regular rhythm.     Pulses: Normal pulses.     Heart sounds: Normal heart sounds. No murmur heard.    No gallop.  Pulmonary:     Effort: Pulmonary effort is normal. No respiratory distress.     Breath sounds: Normal breath sounds. No wheezing or rales.  Abdominal:     General: Bowel sounds are normal.     Tenderness: There is no abdominal tenderness.  Skin:    General: Skin is warm and dry.  Neurological:     Mental Status: She is alert and oriented to person, place, and time.  Psychiatric:        Mood and Affect: Mood normal.        Behavior: Behavior normal.        Judgment: Judgment normal.    BP 128/80 (BP Location: Right Arm, Patient Position: Sitting, Cuff Size: Normal)   Pulse 74   Temp 98.5 F (36.9 C) (Temporal)   Resp 18   Ht 5' 1.75" (1.568 m)   Wt 189 lb 12.8 oz (86.1 kg)   SpO2 96%   BMI 35.00 kg/m  Wt Readings from Last 3 Encounters:  03/20/22 189 lb 12.8 oz (86.1 kg)  03/13/22 180 lb (81.6 kg)  01/17/22 187 lb (84.8 kg)   Diabetic Foot Exam - Simple   No data filed      Assessment & Plan:   Problem List Items Addressed This Visit       Unprioritized   Gastroenteritis    Resolved clinically. Will reassess her renal function and cbc now that she is no longer dehydrated.       Other Visit Diagnoses     Dehydration    -  Primary   Relevant Orders   Basic metabolic panel   CBC with Differential/Platelet     Follow up with PCP as scheduled for routine medical follow up. Sooner if problems/concerns.  No orders of the defined types were placed in this encounter.  I, Nance Pear, NP, personally preformed the services described in this documentation.  All medical record entries made by the scribe were at my direction and in my presence.  I have reviewed the chart and discharge instructions (if applicable) and agree that the record reflects my personal performance and is accurate and complete. 03/20/2022  I,Mohammed Iqbal,acting as  a scribe for Nance Pear, NP.,have documented all relevant documentation on the behalf of Nance Pear, NP,as directed by  Nance Pear, NP while in the presence of Nance Pear, NP.  Nance Pear, NP

## 2022-03-20 NOTE — Assessment & Plan Note (Signed)
Resolved clinically. Will reassess her renal function and cbc now that she is no longer dehydrated.

## 2022-03-21 ENCOUNTER — Other Ambulatory Visit: Payer: Self-pay | Admitting: Family Medicine

## 2022-03-21 ENCOUNTER — Other Ambulatory Visit: Payer: Self-pay

## 2022-03-21 ENCOUNTER — Other Ambulatory Visit (HOSPITAL_COMMUNITY): Payer: Self-pay

## 2022-03-21 LAB — BASIC METABOLIC PANEL
BUN: 17 mg/dL (ref 6–23)
CO2: 25 mEq/L (ref 19–32)
Calcium: 9.3 mg/dL (ref 8.4–10.5)
Chloride: 103 mEq/L (ref 96–112)
Creatinine, Ser: 1.24 mg/dL — ABNORMAL HIGH (ref 0.40–1.20)
GFR: 48.33 mL/min — ABNORMAL LOW (ref >60.00)
Glucose, Bld: 119 mg/dL — ABNORMAL HIGH (ref 70–99)
Potassium: 3.6 mEq/L (ref 3.5–5.1)
Sodium: 137 mEq/L (ref 135–145)

## 2022-03-21 LAB — CBC WITH DIFFERENTIAL/PLATELET
Basophils Absolute: 0.1 10*3/uL (ref 0.0–0.1)
Basophils Relative: 1 % (ref 0.0–3.0)
Eosinophils Absolute: 0.2 10*3/uL (ref 0.0–0.7)
Eosinophils Relative: 2.6 % (ref 0.0–5.0)
HCT: 42.4 % (ref 36.0–46.0)
Hemoglobin: 13.9 g/dL (ref 12.0–15.0)
Lymphocytes Relative: 29.2 % (ref 12.0–46.0)
Lymphs Abs: 2.3 10*3/uL (ref 0.7–4.0)
MCHC: 32.7 g/dL (ref 30.0–36.0)
MCV: 84.5 fl (ref 78.0–100.0)
Monocytes Absolute: 0.5 10*3/uL (ref 0.1–1.0)
Monocytes Relative: 6.6 % (ref 3.0–12.0)
Neutro Abs: 4.8 10*3/uL (ref 1.4–7.7)
Neutrophils Relative %: 60.6 % (ref 43.0–77.0)
Platelets: 313 10*3/uL (ref 150.0–400.0)
RBC: 5.02 Mil/uL (ref 3.87–5.11)
RDW: 14.4 % (ref 11.5–15.5)
WBC: 7.9 10*3/uL (ref 4.0–10.5)

## 2022-03-21 MED ORDER — DEXCOM G6 SENSOR MISC
3 refills | Status: DC
  Filled 2022-03-21: qty 3, 30d supply, fill #0
  Filled 2022-04-19: qty 3, 30d supply, fill #1

## 2022-03-21 MED ORDER — DEXCOM G6 TRANSMITTER MISC
0 refills | Status: DC
  Filled 2022-03-21 – 2022-07-10 (×3): qty 1, 90d supply, fill #0

## 2022-03-22 ENCOUNTER — Other Ambulatory Visit (HOSPITAL_COMMUNITY): Payer: Self-pay

## 2022-03-22 ENCOUNTER — Telehealth: Payer: Self-pay | Admitting: Family

## 2022-03-22 DIAGNOSIS — N289 Disorder of kidney and ureter, unspecified: Secondary | ICD-10-CM

## 2022-03-22 NOTE — Telephone Encounter (Signed)
Blood count is normal.    Kidney function remains mildly slow.  Please avoid NSAIDS, stay well hydrated and continue to work on lowering your sugars and keep blood pressure well controlled.

## 2022-03-22 NOTE — Telephone Encounter (Signed)
Patient notified of results below.

## 2022-04-02 ENCOUNTER — Other Ambulatory Visit: Payer: Self-pay | Admitting: Pulmonary Disease

## 2022-04-02 ENCOUNTER — Other Ambulatory Visit (HOSPITAL_COMMUNITY): Payer: Self-pay

## 2022-04-02 MED ORDER — ALBUTEROL SULFATE HFA 108 (90 BASE) MCG/ACT IN AERS
INHALATION_SPRAY | RESPIRATORY_TRACT | 0 refills | Status: DC
  Filled 2022-04-02: qty 20.1, 75d supply, fill #0

## 2022-04-05 ENCOUNTER — Encounter: Payer: Self-pay | Admitting: Medical

## 2022-04-05 ENCOUNTER — Other Ambulatory Visit (HOSPITAL_BASED_OUTPATIENT_CLINIC_OR_DEPARTMENT_OTHER): Payer: Self-pay

## 2022-04-05 ENCOUNTER — Ambulatory Visit (HOSPITAL_BASED_OUTPATIENT_CLINIC_OR_DEPARTMENT_OTHER)
Admission: RE | Admit: 2022-04-05 | Discharge: 2022-04-05 | Disposition: A | Discharge status: Home / Self Care | Payer: 59 | Source: Ambulatory Visit | Attending: Medical | Admitting: Medical

## 2022-04-05 ENCOUNTER — Ambulatory Visit (INDEPENDENT_AMBULATORY_CARE_PROVIDER_SITE_OTHER): Payer: 59 | Admitting: Medical

## 2022-04-05 VITALS — BP 136/76 | HR 71 | Temp 98.1°F | Resp 18 | Ht 61.0 in | Wt 185.0 lb

## 2022-04-05 DIAGNOSIS — J4521 Mild intermittent asthma with (acute) exacerbation: Secondary | ICD-10-CM | POA: Insufficient documentation

## 2022-04-05 DIAGNOSIS — J40 Bronchitis, not specified as acute or chronic: Secondary | ICD-10-CM | POA: Insufficient documentation

## 2022-04-05 DIAGNOSIS — R059 Cough, unspecified: Secondary | ICD-10-CM | POA: Diagnosis not present

## 2022-04-05 DIAGNOSIS — R0602 Shortness of breath: Secondary | ICD-10-CM | POA: Diagnosis not present

## 2022-04-05 MED ORDER — METHYLPREDNISOLONE 4 MG PO TBPK
ORAL_TABLET | ORAL | 0 refills | Status: DC
  Filled 2022-04-05: qty 21, 6d supply, fill #0

## 2022-04-05 MED ORDER — AZITHROMYCIN 250 MG PO TABS
ORAL_TABLET | ORAL | 0 refills | Status: AC
  Filled 2022-04-05: qty 6, 5d supply, fill #0

## 2022-04-05 MED ORDER — BENZONATATE 100 MG PO CAPS
100.0000 mg | ORAL_CAPSULE | Freq: Three times a day (TID) | ORAL | 0 refills | Status: DC | PRN
  Filled 2022-04-05: qty 30, 10d supply, fill #0

## 2022-04-05 MED ORDER — FLUTICASONE PROPIONATE 50 MCG/ACT NA SUSP
2.0000 | Freq: Every day | NASAL | 1 refills | Status: DC
  Filled 2022-04-05 – 2022-08-21 (×2): qty 16, 30d supply, fill #0
  Filled 2023-04-02: qty 16, 30d supply, fill #1

## 2022-04-05 NOTE — Patient Instructions (Addendum)
You appear to have bronchitis with asthma excacerbation (o2 sat 94%). Rest hydrate and tylenol for fever. I am prescribing cough medicine benzonatate, and azithromycin antibiotic. For your nasal congestion can use flonase.  With asthma exacerbation continue current inhalers and add on brief medrol dose pack. Monitor cgm and continue diabetic meds. If sugars are staying over 200 may need to rx meal time insulin and provide sliding scale.   Follow up in 7-10 days or as needed

## 2022-04-05 NOTE — Progress Notes (Signed)
   Subjective:    Patient ID: Monique Wiggins, female    DOB: [birth year], 56 y.o.   MRN: [id number]  HPI  Pt has been sick for one week. Pt had onset body aches, st and fever early on. Pt states body aches and fever went away in 2 days. But still has st and has productive cough.  She tested negatvie for covid twice. Once early on and another negative test today.   Pt occasional  brief transient wheeze but is feeling more shortness of breath .Marland Kitchen Pt using breztri twice a day. And using albuterol twice a day.  Pt does report dyspnea on walking.     Pt is diabetic. Pt is on glipizide, metformin and farxiga. Recently sugar have been on low 100.     Review of Systems     Objective:   Physical Exam  General- No acute distress. Pleasant patient. Neck- Full range of motion, no jvd Lungs- Clear, even and unlabored. Heart- regular rate and rhythm. Neurologic- CNII- XII grossly intact.  Lower ext- calfs are symmetric. Negative homans signs.    Assessment & Plan:   Patient Instructions  You appear to have bronchitis with asthma excacerbation (o2 sat 94%). Rest hydrate and tylenol for fever. I am prescribing cough medicine benzonatate, and azithromycin antibiotic. For your nasal congestion can use flonase.  With asthma exacerbation continue current inhalers and add on brief medrol dose pack. Monitor cgm and continue diabetic meds. If sugars are staying over 200 may need to rx meal time insulin and provide sliding scale.   Follow up in 7-10 days or as needed    [employer], [employer]

## 2022-04-09 ENCOUNTER — Telehealth: Payer: Self-pay

## 2022-04-09 ENCOUNTER — Other Ambulatory Visit (HOSPITAL_COMMUNITY): Payer: Self-pay

## 2022-04-09 NOTE — Telephone Encounter (Signed)
Called and LM for patient to call back to get scheduled for nurse visit for 2nd and final Hep A vaccine on or after 11-13

## 2022-04-09 NOTE — Telephone Encounter (Signed)
-----   Message from Roetta Sessions, [place] sent at 10/25/2021  5:17 PM EDT ----- Regarding: due for 2nd Hep A on or after 11-13 Patient due for 2nd Hepatitis A vaccine on November 13th. Call and schedule a nurse visit. Order is in

## 2022-04-11 NOTE — Telephone Encounter (Signed)
Called and spoke to patient. Scheduled her for 2nd hep A vaccine on Monday 11-13 at 11:20am. Order is in

## 2022-04-12 ENCOUNTER — Ambulatory Visit: Payer: 59 | Admitting: Nurse Practitioner

## 2022-04-13 ENCOUNTER — Other Ambulatory Visit (HOSPITAL_COMMUNITY): Payer: Self-pay

## 2022-04-19 ENCOUNTER — Other Ambulatory Visit (HOSPITAL_COMMUNITY): Payer: Self-pay

## 2022-04-24 ENCOUNTER — Other Ambulatory Visit (HOSPITAL_COMMUNITY): Payer: Self-pay

## 2022-04-26 ENCOUNTER — Other Ambulatory Visit (HOSPITAL_COMMUNITY): Payer: Self-pay

## 2022-04-30 ENCOUNTER — Ambulatory Visit (INDEPENDENT_AMBULATORY_CARE_PROVIDER_SITE_OTHER): Payer: 59 | Admitting: Gastroenterology

## 2022-04-30 DIAGNOSIS — Z23 Encounter for immunization: Secondary | ICD-10-CM | POA: Diagnosis not present

## 2022-05-18 ENCOUNTER — Other Ambulatory Visit: Payer: Self-pay | Admitting: Internal Medicine

## 2022-05-18 ENCOUNTER — Other Ambulatory Visit: Payer: Self-pay

## 2022-05-18 ENCOUNTER — Other Ambulatory Visit (HOSPITAL_BASED_OUTPATIENT_CLINIC_OR_DEPARTMENT_OTHER): Payer: Self-pay

## 2022-05-18 DIAGNOSIS — E1165 Type 2 diabetes mellitus with hyperglycemia: Secondary | ICD-10-CM

## 2022-05-22 ENCOUNTER — Other Ambulatory Visit: Payer: Self-pay | Admitting: Family Medicine

## 2022-05-22 ENCOUNTER — Other Ambulatory Visit (HOSPITAL_COMMUNITY): Payer: Self-pay

## 2022-05-22 ENCOUNTER — Other Ambulatory Visit: Payer: Self-pay | Admitting: Gastroenterology

## 2022-05-22 MED ORDER — OMEPRAZOLE 40 MG PO CPDR
40.0000 mg | DELAYED_RELEASE_CAPSULE | Freq: Every day | ORAL | 0 refills | Status: DC
  Filled 2022-05-22: qty 90, 90d supply, fill #0

## 2022-05-22 MED ORDER — DULOXETINE HCL 30 MG PO CPEP
60.0000 mg | ORAL_CAPSULE | Freq: Every day | ORAL | 0 refills | Status: DC
  Filled 2022-05-22: qty 180, 90d supply, fill #0

## 2022-05-22 MED ORDER — MOMETASONE FUROATE 0.1 % EX CREA
1.0000 | TOPICAL_CREAM | Freq: Every day | CUTANEOUS | 1 refills | Status: DC
  Filled 2022-05-22: qty 135, 30d supply, fill #0
  Filled 2022-08-07: qty 135, 30d supply, fill #1

## 2022-05-23 ENCOUNTER — Other Ambulatory Visit (HOSPITAL_COMMUNITY): Payer: Self-pay | Admitting: Emergency Medicine

## 2022-05-23 ENCOUNTER — Other Ambulatory Visit (HOSPITAL_COMMUNITY): Payer: Self-pay

## 2022-05-23 DIAGNOSIS — Z9841 Cataract extraction status, right eye: Secondary | ICD-10-CM | POA: Diagnosis not present

## 2022-05-23 DIAGNOSIS — E119 Type 2 diabetes mellitus without complications: Secondary | ICD-10-CM | POA: Diagnosis not present

## 2022-05-23 DIAGNOSIS — Z9842 Cataract extraction status, left eye: Secondary | ICD-10-CM | POA: Diagnosis not present

## 2022-05-23 DIAGNOSIS — H40053 Ocular hypertension, bilateral: Secondary | ICD-10-CM | POA: Diagnosis not present

## 2022-05-23 DIAGNOSIS — Z961 Presence of intraocular lens: Secondary | ICD-10-CM | POA: Diagnosis not present

## 2022-05-23 MED ORDER — SERTRALINE HCL 50 MG PO TABS
50.0000 mg | ORAL_TABLET | Freq: Every day | ORAL | 0 refills | Status: DC
  Filled 2022-05-23: qty 90, 90d supply, fill #0

## 2022-05-23 MED ORDER — AMLODIPINE BESYLATE 5 MG PO TABS
5.0000 mg | ORAL_TABLET | Freq: Every day | ORAL | 0 refills | Status: DC
  Filled 2022-05-23: qty 90, 90d supply, fill #0

## 2022-05-23 MED ORDER — CARVEDILOL 25 MG PO TABS
25.0000 mg | ORAL_TABLET | Freq: Two times a day (BID) | ORAL | 0 refills | Status: DC
  Filled 2022-05-23: qty 180, 90d supply, fill #0

## 2022-05-23 MED ORDER — TOPIRAMATE 50 MG PO TABS
50.0000 mg | ORAL_TABLET | Freq: Every day | ORAL | 0 refills | Status: DC
  Filled 2022-05-23: qty 90, 90d supply, fill #0

## 2022-05-23 MED ORDER — DEXCOM G6 SENSOR MISC
3 refills | Status: DC
  Filled 2022-05-23: qty 3, 30d supply, fill #0
  Filled 2022-07-10: qty 3, 30d supply, fill #1
  Filled 2022-08-07: qty 3, 30d supply, fill #2
  Filled 2022-08-21 – 2022-08-31 (×2): qty 3, 30d supply, fill #3

## 2022-05-24 ENCOUNTER — Encounter: Payer: 59 | Attending: Internal Medicine | Admitting: Dietician

## 2022-05-24 ENCOUNTER — Encounter: Payer: Self-pay | Admitting: Dietician

## 2022-05-24 ENCOUNTER — Other Ambulatory Visit (HOSPITAL_COMMUNITY): Payer: Self-pay

## 2022-05-24 VITALS — Wt 190.0 lb

## 2022-05-24 DIAGNOSIS — E119 Type 2 diabetes mellitus without complications: Secondary | ICD-10-CM | POA: Insufficient documentation

## 2022-05-24 NOTE — Progress Notes (Signed)
Diabetes Self-Management Education  Visit Type: Follow-up  Appt. Start Time: 1545 Appt. End Time: 1615  05/24/2022  [name], identified by name and date of birth, is a 56 y.o. female with a diagnosis of Diabetes:  .   ASSESSMENT Patient is here today alone.  She was last seen in this office on 9/21/023.  Her granddaughter is still at the home but is no longer influencing her food choices. Transmitter on Dexcom is not working and a new one is being shipped. Sleep is still poor. Does not exercise. Goal is to start back with Zumba classes in January (3 x per week). Takes flax seeds (ground) and Citrucel every other day which has helped with constipation.  History includes Type 2 diabetes, nausea after gallbladder removed 02/2021, HTN, HLD, OSA (dx years ago and no longer uses c-pap)   Poor sleep (3 hours per night), wakes frequently to use the restroom Back pain A1C  7.3% 08/31/2021, 7.1% 01/23/2021, cholesterol 106, HDL 45, Triglycerides 234 10/2020, GFR 62 08/31/2021  Medications include:  Farxiga, Metformin, glipizide, Vitamin B-12, vitamin D3, omega 3, zinc, vitamin C CDG:  Dexcom G6 Weaning self away from bread   Weight hx: 190 lbs 05/24/2022 - lost weight but regained with Thanksgiving (9 sweet potato pies) 189 lbs 03/06/2022 183 lbs 01/2022 193 lbs 07/20/2021 192 lbs 05/18/2021 187 lbs 03/15/2021 171 lbs 01/11/2020 155 lbs lowest adult weight 2018 195 lbs highest adult weight  10/17/2017   Patient lives with her husband and mother.  They share shopping and cooking.  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  She is a Geophysicist/field seismologist (babies and dogs). Allergic to shellfish Lactose intolerant Spinach and all greens causes diarrhea.  Lettuce does not cause symptoms and recently tolerated cooked kale. Doesn't tolerate raw onions Her husband is very supportive but enjoys feeding her and likes to snack at night. - States that she now  understands. Increased stress this year.  Weight 190 lb (86.2 kg). Body mass index is 35.9 kg/m.   Diabetes Self-Management Education - 05/24/22 1600       Visit Information   Visit Type Follow-up      Pre-Education Assessment   Patient understands the diabetes disease and treatment process. Demonstrates understanding / competency    Patient understands incorporating nutritional management into lifestyle. Comprehends key points    Patient undertands incorporating physical activity into lifestyle. Comprehends key points    Patient understands using medications safely. Demonstrates understanding / competency    Patient understands monitoring blood glucose, interpreting and using results Demonstrates understanding / competency    Patient understands prevention, detection, and treatment of acute complications. Demonstrates understanding / competency    Patient understands prevention, detection, and treatment of chronic complications. Demonstrates understanding / competency    Patient understands how to develop strategies to address psychosocial issues. Demonstrates understanding / competency    Patient understands how to develop strategies to promote health/change behavior. Comprehends key points      Complications   How often do you check your blood sugar? > 4 times/day      Dietary Intake   Breakfast apple    Snack (morning) none    Lunch can of cream of broccoli soup    Snack (afternoon) none    Dinner chicken wing, slaw    Snack (evening) none    Beverage(s) water      Activity / Exercise   Activity / Exercise Type ADL's  walking work related     Patient Education   Previous Diabetes Education Yes (please comment)   02/2022   Healthy Eating Meal options for control of blood glucose level and chronic complications.    Being Active Role of exercise on diabetes management, blood pressure control and cardiac health.      Individualized Goals (developed by patient)   Nutrition  General guidelines for healthy choices and portions discussed    Physical Activity Exercise 3-5 times per week;60 minutes per day    Medications take my medication as prescribed    Monitoring  Consistenly use CGM    Problem Solving Eating Pattern;Sleep Pattern    Reducing Risk examine blood glucose patterns      Patient Self-Evaluation of Goals - Patient rates self as meeting previously set goals (% of time)   Nutrition 50 - 75 % (half of the time)    Physical Activity < 25% (hardly ever/never)    Medications >75% (most of the time)    Monitoring >75% (most of the time)    Problem Solving and behavior change strategies  >75% (most of the time)    Reducing Risk (treating acute and chronic complications) >33% (most of the time)    Health Coping 50 - 75 % (half of the time)      Post-Education Assessment   Patient understands the diabetes disease and treatment process. Demonstrates understanding / competency    Patient understands incorporating nutritional management into lifestyle. Demonstrates understanding / competency    Patient undertands incorporating physical activity into lifestyle. Demonstrates understanding / competency    Patient understands using medications safely. Demonstrates understanding / competency    Patient understands monitoring blood glucose, interpreting and using results Demonstrates understanding / competency    Patient understands prevention, detection, and treatment of acute complications. Demonstrates understanding / competency    Patient understands prevention, detection, and treatment of chronic complications. Demonstrates understanding / competency    Patient understands how to develop strategies to address psychosocial issues. Demonstrates understanding / competency    Patient understands how to develop strategies to promote health/change behavior. Needs Review      Outcomes   Expected Outcomes Demonstrated interest in learning. Expect positive outcomes     Future DMSE 3-4 months    Program Status Not Completed      Subsequent Visit   Since your last visit have you continued or begun to take your medications as prescribed? Yes    Since your last visit have you experienced any weight changes? Gain    Weight Loss (lbs) 1             Individualized Plan for Diabetes Self-Management Training:   Learning Objective:  Patient will have a greater understanding of diabetes self-management. Patient education plan is to attend individual and/or group sessions per assessed needs and concerns.   Plan:   Patient Instructions  Consider Magnesium Glycinate (best absorbed) before bed to see if this helps you sleep.  Goal:  start back to [place] or Gym at work  Mindfulness:   Am I really hungry for the snack or eating because someone handed me something? Healthy snack options for work Meal prep Make time to care for yourself    Expected Outcomes:  Demonstrated interest in learning. Expect positive outcomes  Education material provided:   If problems or questions, patient to contact team via:  Phone  Future DSME appointment: 3-4 months

## 2022-05-24 NOTE — Patient Instructions (Addendum)
Consider Magnesium Glycinate (best absorbed) before bed to see if this helps you sleep.  Goal:  start back to [place] or Gym at work  Mindfulness:   Am I really hungry for the snack or eating because someone handed me something? Healthy snack options for work Meal prep Make time to care for yourself

## 2022-05-31 ENCOUNTER — Encounter: Payer: 59 | Admitting: Family Medicine

## 2022-06-05 ENCOUNTER — Encounter: Payer: Self-pay | Admitting: Family Medicine

## 2022-06-08 ENCOUNTER — Encounter: Payer: Self-pay | Admitting: Family Medicine

## 2022-06-08 ENCOUNTER — Other Ambulatory Visit (HOSPITAL_COMMUNITY): Payer: Self-pay

## 2022-06-08 ENCOUNTER — Other Ambulatory Visit: Payer: Self-pay | Admitting: Family Medicine

## 2022-06-12 ENCOUNTER — Other Ambulatory Visit (HOSPITAL_COMMUNITY): Payer: Self-pay

## 2022-06-12 MED ORDER — GABAPENTIN 300 MG PO CAPS
300.0000 mg | ORAL_CAPSULE | Freq: Every day | ORAL | 1 refills | Status: DC
  Filled 2022-06-12: qty 90, 90d supply, fill #0

## 2022-06-12 MED ORDER — GABAPENTIN 300 MG PO CAPS
300.0000 mg | ORAL_CAPSULE | Freq: Every day | ORAL | 1 refills | Status: DC
  Filled 2022-06-12: qty 90, 90d supply, fill #0
  Filled 2022-09-05: qty 90, 90d supply, fill #1

## 2022-06-19 ENCOUNTER — Other Ambulatory Visit: Payer: Self-pay | Admitting: Internal Medicine

## 2022-06-19 ENCOUNTER — Other Ambulatory Visit: Payer: Self-pay

## 2022-06-19 ENCOUNTER — Other Ambulatory Visit: Payer: Self-pay | Admitting: Family Medicine

## 2022-06-19 ENCOUNTER — Other Ambulatory Visit (HOSPITAL_COMMUNITY): Payer: Self-pay

## 2022-06-19 MED ORDER — TOPIRAMATE 50 MG PO TABS
50.0000 mg | ORAL_TABLET | Freq: Every day | ORAL | 0 refills | Status: DC
  Filled 2022-06-19 – 2022-08-21 (×2): qty 90, 90d supply, fill #0

## 2022-06-19 MED ORDER — AMLODIPINE BESYLATE 5 MG PO TABS
5.0000 mg | ORAL_TABLET | Freq: Every day | ORAL | 0 refills | Status: DC
  Filled 2022-06-19 – 2022-08-21 (×2): qty 90, 90d supply, fill #0

## 2022-06-20 ENCOUNTER — Other Ambulatory Visit (HOSPITAL_COMMUNITY): Payer: Self-pay

## 2022-06-21 ENCOUNTER — Other Ambulatory Visit (HOSPITAL_COMMUNITY): Payer: Self-pay

## 2022-06-21 ENCOUNTER — Other Ambulatory Visit: Payer: Self-pay

## 2022-06-26 ENCOUNTER — Other Ambulatory Visit (HOSPITAL_COMMUNITY): Payer: Self-pay

## 2022-06-26 ENCOUNTER — Other Ambulatory Visit: Payer: Self-pay | Admitting: Family Medicine

## 2022-06-26 MED ORDER — GLIPIZIDE 5 MG PO TABS
2.5000 mg | ORAL_TABLET | Freq: Every day | ORAL | 3 refills | Status: DC
  Filled 2022-06-26 – 2022-07-04 (×2): qty 45, 90d supply, fill #0
  Filled 2022-12-17: qty 45, 90d supply, fill #1
  Filled 2023-03-15: qty 45, 90d supply, fill #2
  Filled 2023-06-10: qty 45, 90d supply, fill #3

## 2022-07-03 ENCOUNTER — Ambulatory Visit (INDEPENDENT_AMBULATORY_CARE_PROVIDER_SITE_OTHER): Payer: Commercial Managed Care - PPO | Admitting: Family Medicine

## 2022-07-03 ENCOUNTER — Encounter: Payer: Self-pay | Admitting: Family Medicine

## 2022-07-03 VITALS — BP 132/74 | HR 70 | Temp 98.0°F | Resp 16 | Ht 61.0 in | Wt 192.8 lb

## 2022-07-03 DIAGNOSIS — E876 Hypokalemia: Secondary | ICD-10-CM | POA: Diagnosis not present

## 2022-07-03 DIAGNOSIS — E669 Obesity, unspecified: Secondary | ICD-10-CM

## 2022-07-03 DIAGNOSIS — Z0000 Encounter for general adult medical examination without abnormal findings: Secondary | ICD-10-CM

## 2022-07-03 DIAGNOSIS — N289 Disorder of kidney and ureter, unspecified: Secondary | ICD-10-CM | POA: Diagnosis not present

## 2022-07-03 DIAGNOSIS — E1165 Type 2 diabetes mellitus with hyperglycemia: Secondary | ICD-10-CM

## 2022-07-03 DIAGNOSIS — E785 Hyperlipidemia, unspecified: Secondary | ICD-10-CM | POA: Diagnosis not present

## 2022-07-03 DIAGNOSIS — Z1231 Encounter for screening mammogram for malignant neoplasm of breast: Secondary | ICD-10-CM

## 2022-07-03 MED ORDER — TIRZEPATIDE 5 MG/0.5ML ~~LOC~~ SOAJ
5.0000 mg | SUBCUTANEOUS | 1 refills | Status: DC
  Filled 2022-07-03: qty 2, 28d supply, fill #0

## 2022-07-03 NOTE — Progress Notes (Signed)
Subjective:   By signing my name below, I, Kellie Simmering, attest that this documentation has been prepared under the direction and in the presence of Mosie Lukes, MD., 07/03/2022.   Patient ID: Monique Wiggins, female    DOB: [birth year], 57 y.o.   MRN: [id number]  Chief Complaint  Patient presents with   Annual Exam    Annual Exam    HPI Patient is in today for a comprehensive physical exam and follow up on chronic medical concerns. She denies CP/palpitations/SOB/HA/congestion/fever/ chills/GI or GU symptoms.  Family History Her mother recently had nasal polyps discovered.  Left Eye Glaucoma She is currently on a regimen of prednisolone eye drops to manage glaucoma in her left eye.  Weight Loss She is interested in taking injectable weight loss medications. Body mass index is 36.43 kg/m. She measures her blood glucose levels at home and reports having a recent high of 232 mg/dL. She does occasionally experience constipation and is inquiring how the injectable medications will affect this. Wt Readings from Last 3 Encounters:  07/03/22 192 lb 12.8 oz (87.5 kg)  05/24/22 190 lb (86.2 kg)  04/05/22 185 lb (83.9 kg)   Lab Results  Component Value Date   HGBA1C 7.5 (H) 07/03/2022   Past Medical History:  Diagnosis Date   Allergy    Arthritis    Back pain    Carpal tunnel syndrome    Diabetes mellitus without complication (HCC)    DVT (deep venous thrombosis) ([place])    after long car trip   Dyspnea    Gastroparesis    GERD (gastroesophageal reflux disease)    H. pylori infection    1997   Heart murmur    Hypertension    Hypertriglyceridemia 02/22/2017   Hypertriglyceridemia 02/22/2017   Migraines    Sickle cell trait ([place])    Sleep apnea    states she no longer has OSA lost weight    Past Surgical History:  Procedure Laterality Date   ABDOMINAL HYSTERECTOMY     ovaries left in place   CARPAL TUNNEL RELEASE Right 01/19/2019   Procedure: ENDOSCOPIC  CARPAL TUNNEL RELEASE;  Surgeon: Milly Jakob, MD;  Location: [facility];  Service: Orthopedics;  Laterality: Right;   CARPOMETACARPEL SUSPENSION PLASTY Right 01/19/2019   Procedure: RIGHT THUMB SUSPENSION ARTHROPLASTY;  Surgeon: Milly Jakob, MD;  Location: [facility];  Service: Orthopedics;  Laterality: Right;  PREOP BLOCK   CHOLECYSTECTOMY  01/24/2021   TOE SURGERY     left great toe, repair of severed tendon   TONSILLECTOMY     tummy tuck      Family History  Problem Relation Age of Onset   Other Mother        nasal polyp   Diabetes type II Mother    High blood pressure Mother    Diabetes Mellitus II Father    Pulmonary fibrosis Father    Hypertension Father    Pulmonary disease Father    High Cholesterol Father    Hypertension Sister    Diabetes Sister    Allergic Disorder Son    Hyperthyroidism Son    Allergic Disorder Son    Asthma Son    Stomach cancer Maternal Grandmother        possibility that this was colon cancer instead-patient and her mother have differing recollection   Cancer Maternal Grandfather        lung, smoker   Esophageal cancer Neg Hx    Pancreatic  cancer Neg Hx    Colon cancer Neg Hx     Social History   Socioeconomic History   Marital status: Married    Spouse name: Trilby Drummer   Number of children: Not on file   Years of education: Not on file   Highest education level: Not on file  Occupational History   Occupation: Patient Care [employer]  Tobacco Use   Smoking status: Never   Smokeless tobacco: Never  Vaping Use   Vaping Use: Never used  Substance and Sexual Activity   Alcohol use: Never    Comment: never   Drug use: No   Sexual activity: Yes    Birth control/protection: Surgical  Other Topics Concern   Not on file  Social History Narrative   ** Merged History Encounter **       Married   5 children (4 boys and 1 girl) All grown, has one daughter in [place], son in [place], son in  [place], one in [place], youngest in Nature conservation officer in [place]   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 [employer]   Enjoys carpentry, Armed forces logistics/support/administrative officer, Materials engineer      Lactose intolerant   Social Determinants of Radio broadcast assistant Strain: Not on file  Food Insecurity: Not on file  Transportation Needs: Not on file  Physical Activity: Not on file  Stress: Not on file  Social Connections: Not on file  Intimate Partner Violence: Not on file    Outpatient Medications Prior to Visit  Medication Sig Dispense Refill   albuterol (VENTOLIN HFA) 108 (90 Base) MCG/ACT inhaler Inhale 2 puffs into the lungs every 6 hours as needed for wheezing or shortness of breath 20.1 g 0   aluminum-magnesium hydroxide-simethicone (MAALOX) 200-200-20 MG/5ML SUSP Take 30 mLs by mouth 3 (three) times daily with meals as needed. 355 mL 0   amLODipine (NORVASC) 5 MG tablet Take 1 tablet (5 mg total) by mouth daily. 90 tablet 0   Ascorbic Acid (VITAMIN C PO) Take 1 capsule by mouth daily.     atorvastatin (LIPITOR) 20 MG tablet Take 1 tablet (20 mg total) by mouth daily. 90 tablet 1   benzonatate (TESSALON) 100 MG capsule Take 1 capsule (100 mg total) by mouth 3 (three) times daily as needed for cough. 30 capsule 0   Blood Glucose Monitoring Suppl (FREESTYLE LITE) w/Device KIT Use as directed to test blood sugar once a day 1 kit 0   Budeson-Glycopyrrol-Formoterol (BREZTRI AEROSPHERE) 160-9-4.8 MCG/ACT AERO Inhale 2 puffs into the lungs in the morning and at bedtime. 10.7 g 0   carvedilol (COREG) 25 MG tablet Take 1 tablet (25 mg total) by mouth 2 (two) times daily with a meal. 180 tablet 0   cetirizine (ZYRTEC) 10 MG tablet Take 1 tablet (10 mg total) by mouth daily. 90 tablet 1   chlorhexidine (PERIDEX) 0.12 % solution 15 mLs 2 (two) times daily.     cholecalciferol (VITAMIN D3) 25 MCG (1000 UNIT) tablet Take 1,000 Units by mouth daily.     Continuous Blood Gluc Receiver (DEXCOM  G6 RECEIVER) DEVI Use as directed to check blood sugars 1 each 0   Continuous Blood Gluc Sensor (DEXCOM G6 SENSOR) MISC Check blood sugars as directed 3 each 3   Continuous Blood Gluc Transmit (DEXCOM G6 TRANSMITTER) MISC Check blood sugars as directed 1 each 0   Cyanocobalamin (B-12 SL) Place 1 capsule under the tongue  daily.     dapagliflozin propanediol (FARXIGA) 10 MG TABS tablet Take 1 tablet (10 mg total) by mouth daily. 90 tablet 3   dicyclomine (BENTYL) 20 MG tablet Take 1 tablet (20 mg total) by mouth 3 (three) times daily as needed. 20 tablet 0   DULoxetine (CYMBALTA) 30 MG capsule Take 2 capsules (60 mg total) by mouth daily. 180 capsule 0   fluticasone (FLONASE) 50 MCG/ACT nasal spray Place 2 sprays into both nostrils daily as needed for allergies. 48 g 1   fluticasone (FLONASE) 50 MCG/ACT nasal spray Place 2 sprays into both nostrils daily. 16 g 1   fluticasone-salmeterol (ADVAIR DISKUS) 500-50 MCG/ACT AEPB Inhale 1 puff into the lungs in the morning and at bedtime. 60 each 3   gabapentin (NEURONTIN) 300 MG capsule Take 1 capsule (300 mg total) by mouth at bedtime. 90 capsule 1   glipiZIDE (GLUCOTROL) 5 MG tablet Take 1/2 tablet (2.5 mg total) by mouth daily before breakfast. 45 tablet 3   glucose blood test strip Use as directed to test blood sugar daily in the afternoon 100 each 3   hydrochlorothiazide (HYDRODIURIL) 25 MG tablet Take 1 tablet (25 mg total) by mouth daily. 90 tablet 1   hyoscyamine (ANASPAZ) 0.125 MG TBDP disintergrating tablet Place 1 tablet (0.125 mg total) under the tongue every 6 (six) hours as needed for cramping. 60 tablet 2   Lancets (FREESTYLE) lancets Use as directed to test once a day 100 each 3   linaclotide (LINZESS) 145 MCG CAPS capsule Take 1 capsule (145 mcg total) by mouth daily before breakfast. 30 capsule 2   loperamide (IMODIUM) 2 MG capsule Take 1 capsule (2 mg total) by mouth 4 (four) times daily as needed for diarrhea or loose stools. 12 capsule 0    losartan (COZAAR) 100 MG tablet TAKE 1 TABLET BY MOUTH DAILY *TAKE WITH HCTZ 90 tablet 1   LOTEMAX 0.5 % ophthalmic suspension Place 1 drop into both eyes 4 (four) times daily as needed for irritation.     metFORMIN (GLUCOPHAGE) 500 MG tablet Take 1 tablet (500 mg total) by mouth daily with breakfast. 90 tablet 3   methylPREDNISolone (MEDROL) 4 MG TBPK tablet Take as directed on 6 day dose pack. 21 tablet 0   mometasone (ELOCON) 0.1 % cream Apply to affected area daily. 135 g 1   Multiple Vitamin (MULTIVITAMIN WITH MINERALS) TABS tablet Take 1 tablet by mouth daily.     Multiple Vitamins-Minerals (ZINC PO) Take 1 tablet by mouth daily.     Omega-3 Fatty Acids (FISH OIL PO) Take 1 capsule by mouth daily.     omeprazole (PRILOSEC) 40 MG capsule Take 1 capsule (40 mg total) by mouth daily. 90 capsule 0   ondansetron (ZOFRAN-ODT) 4 MG disintegrating tablet Dissolve 1 tablet (4 mg total) by mouth every 8 (eight) hours as needed. 20 tablet 0   Polyvinyl Alcohol-Povidone (CLEAR EYES NATURAL TEARS OP) Place 1 drop into both eyes daily as needed (dry eyes).     potassium chloride SA (KLOR-CON M) 20 MEQ tablet TAKE TWO TABLETS BY MOUTH ON TUESDAY AND SATURDAY AND ONE TABLET ALL OTHER DAYS. 114 tablet 1   sertraline (ZOLOFT) 50 MG tablet Take 1 tablet (50 mg total) by mouth at bedtime. 90 tablet 0   topiramate (TOPAMAX) 50 MG tablet Take 1 tablet (50 mg total) by mouth daily. 90 tablet 0   tretinoin (RETIN-A) 0.025 % cream Apply to affected area at bedtime. 135 g 1  VITAMIN E PO Take 1 capsule by mouth daily.     No facility-administered medications prior to visit.    Allergies  Allergen Reactions   Pollen Extract Swelling   Shellfish Allergy Swelling    Review of Systems  Constitutional:  Negative for chills and fever.  HENT:  Negative for congestion.   Respiratory:  Negative for shortness of breath.   Cardiovascular:  Negative for chest pain and palpitations.  Gastrointestinal:  Negative  for abdominal pain, blood in stool, constipation, diarrhea, nausea and vomiting.  Genitourinary:  Negative for dysuria, frequency, hematuria and urgency.  Skin:           Neurological:  Negative for headaches.       Objective:    Physical Exam Constitutional:      General: She is not in acute distress.    Appearance: Normal appearance. She is normal weight. She is not ill-appearing.  HENT:     Head: Normocephalic and atraumatic.     Right Ear: Tympanic membrane, ear canal and external ear normal.     Left Ear: Tympanic membrane, ear canal and external ear normal.     Nose: Nose normal.     Mouth/Throat:     Mouth: Mucous membranes are moist.     Pharynx: Oropharynx is clear.  Eyes:     General:        Right eye: No discharge.        Left eye: No discharge.     Extraocular Movements: Extraocular movements intact.     Right eye: No nystagmus.     Left eye: No nystagmus.     Pupils: Pupils are equal, round, and reactive to light.  Neck:     Vascular: No carotid bruit.  Cardiovascular:     Rate and Rhythm: Normal rate and regular rhythm.     Pulses: Normal pulses.     Heart sounds: Normal heart sounds. No murmur heard.    No gallop.  Pulmonary:     Effort: Pulmonary effort is normal. No respiratory distress.     Breath sounds: Normal breath sounds. No wheezing or rales.  Abdominal:     General: Bowel sounds are normal.     Palpations: Abdomen is soft.     Tenderness: There is no abdominal tenderness. There is no guarding.  Musculoskeletal:        General: Normal range of motion.     Cervical back: Normal range of motion.     Right lower leg: No edema.     Left lower leg: No edema.     Comments: Muscle strength 5/5 on upper and lower extremities.   Lymphadenopathy:     Cervical: No cervical adenopathy.  Skin:    General: Skin is warm and dry.  Neurological:     Mental Status: She is alert and oriented to person, place, and time.     Sensory: Sensation is intact.      Motor: Motor function is intact.     Coordination: Coordination is intact.     Deep Tendon Reflexes: Babinski sign absent on the left side.     Reflex Scores:      Patellar reflexes are 1+ on the right side and 1+ on the left side. Psychiatric:        Mood and Affect: Mood normal.        Behavior: Behavior normal.        Judgment: Judgment normal.    BP 132/74 (BP Location: Right  Arm, Patient Position: Sitting, Cuff Size: Normal)   Pulse 70   Temp 98 F (36.7 C) (Oral)   Resp 16   Ht '5\' 1"'$  (1.549 m)   Wt 192 lb 12.8 oz (87.5 kg)   SpO2 96%   BMI 36.43 kg/m  Wt Readings from Last 3 Encounters:  07/03/22 192 lb 12.8 oz (87.5 kg)  05/24/22 190 lb (86.2 kg)  04/05/22 185 lb (83.9 kg)    Diabetic Foot Exam - Simple   No data filed    Lab Results  Component Value Date   WBC 9.4 07/03/2022   HGB 13.7 07/03/2022   HCT 41.8 07/03/2022   PLT 370.0 07/03/2022   GLUCOSE 90 07/03/2022   CHOL 154 07/03/2022   TRIG 266.0 (H) 07/03/2022   HDL 53.40 07/03/2022   LDLDIRECT 36.0 07/03/2022   LDLCALC 19 08/31/2021   ALT 42 (H) 07/03/2022   AST 28 07/03/2022   NA 140 07/03/2022   K 3.5 07/03/2022   CL 105 07/03/2022   CREATININE 1.36 (H) 07/03/2022   BUN 17 07/03/2022   CO2 26 07/03/2022   TSH 0.87 07/03/2022   HGBA1C 7.5 (H) 07/03/2022   MICROALBUR 1.0 11/27/2021    Lab Results  Component Value Date   TSH 0.87 07/03/2022   Lab Results  Component Value Date   WBC 9.4 07/03/2022   HGB 13.7 07/03/2022   HCT 41.8 07/03/2022   MCV 83.8 07/03/2022   PLT 370.0 07/03/2022   Lab Results  Component Value Date   NA 140 07/03/2022   K 3.5 07/03/2022   CO2 26 07/03/2022   GLUCOSE 90 07/03/2022   BUN 17 07/03/2022   CREATININE 1.36 (H) 07/03/2022   BILITOT 0.4 07/03/2022   ALKPHOS 98 07/03/2022   AST 28 07/03/2022   ALT 42 (H) 07/03/2022   PROT 7.4 07/03/2022   ALBUMIN 4.5 07/03/2022   CALCIUM 9.0 07/03/2022   ANIONGAP 8 03/13/2022   GFR 43.17 (L) 07/03/2022   Lab  Results  Component Value Date   CHOL 154 07/03/2022   Lab Results  Component Value Date   HDL 53.40 07/03/2022   Lab Results  Component Value Date   LDLCALC 19 08/31/2021   Lab Results  Component Value Date   TRIG 266.0 (H) 07/03/2022   Lab Results  Component Value Date   CHOLHDL 3 07/03/2022   Lab Results  Component Value Date   HGBA1C 7.5 (H) 07/03/2022      Assessment & Plan:  Colonoscopy: Last completed on 05/03/2017.  -The examined portion of the ileum was normal. -One 4 mm polyp in the transverse colon, removed with a cold snare. Resected and retrieved. -Two 3 to 4 mm polyps at the recto-sigmoid colon, removed with a cold snare. Resected and retrieved. -Diverticulosis in the ascending colon. -The examination was otherwise normal. Repeat in 10 years.  Mammogram: Last completed on 05/15/2021 with no mammographic evidence of malignancy. Order has been placed today.  Pap Smear: Last completed on 05/20/2019 with normal results. Repeat in 3-5 years.   Advanced Directives: Encouraged patient to complete advanced care planning documents.  GERD: Well-controlled.  Healthy Lifestyle: Encouraged exercise, heart healthy diet, and hydration.  Immunizations: Reviewed patient's immunization history. Encouraged COVID-19 immunization and Tetanus immunization if injured.  Labs: Routine lab work today will also check HGBA1C.  Weight Loss: Mounjaro 5 mg/0.5 mL weekly for 2 months has been prescribed today.   Problem List Items Addressed This Visit     Hyperlipidemia  Encourage heart healthy diet such as MIND or DASH diet, increase exercise, avoid trans fats, simple carbohydrates and processed foods, consider a krill or fish or flaxseed oil cap daily. Tolerating Atorvastatin      Relevant Orders   Lipid panel (Completed)   TSH (Completed)   Preventative health care    Patient encouraged to maintain heart healthy diet, regular exercise, adequate sleep. Consider daily  probiotics. Take medications as prescribed. Labs ordered and reviewed. Given and reviewed copy of ACP documents from [place] and encouraged to complete and return, next [facility] ordered. Pap 2020 repeat pelvic 2025 Colonoscopy 2018 repeat in 2028      Hypokalemia   Obesity    Encouraged DASH or MIND diet, decrease po intake and increase exercise as tolerated. Needs 7-8 hours of sleep nightly. Avoid trans fats, eat small, frequent meals every 4-5 hours with lean proteins, complex carbs and healthy fats. Minimize simple carbs, high fat foods and processed foods       Relevant Medications   tirzepatide (MOUNJARO) 5 MG/0.5ML Pen   Type 2 diabetes mellitus with hyperglycemia, without long-term current use of insulin (HCC)    hgba1c acceptable, minimize simple carbs. Increase exercise as tolerated. Continue current meds, patient is started on Mounjaro      Relevant Medications   tirzepatide (MOUNJARO) 5 MG/0.5ML Pen   Other Relevant Orders   Hemoglobin A1c (Completed)   CBC with Differential/Platelet (Completed)   Comprehensive metabolic panel (Completed)   TSH (Completed)   Renal insufficiency    Hydrate and monitor       Relevant Orders   CBC with Differential/Platelet (Completed)   TSH (Completed)   Other Visit Diagnoses     Encounter for screening mammogram for malignant neoplasm of breast    -  Primary   Relevant Orders   MM 3D SCREEN BREAST BILATERAL      Meds ordered this encounter  Medications   tirzepatide (MOUNJARO) 5 MG/0.5ML Pen    Sig: Inject 5 mg into the skin once a week.    Dispense:  2 mL    Refill:  1   I, Penni Homans, MD, personally preformed the services described in this documentation.  All medical record entries made by the scribe were at my direction and in my presence.  I have reviewed the chart and discharge instructions (if applicable) and agree that the record reflects my personal performance and is accurate and complete.  07/03/2022  I,Mohammed Iqbal,acting as a scribe for Penni Homans, MD.,have documented all relevant documentation on the behalf of Penni Homans, MD,as directed by  Penni Homans, MD while in the presence of Penni Homans, MD.  Penni Homans, MD

## 2022-07-03 NOTE — Patient Instructions (Signed)
Preventive Care 40-58 Years Old, Female Preventive care refers to lifestyle choices and visits with your health care provider that can promote health and wellness. Preventive care visits are also called wellness exams. What can I expect for my preventive care visit? Counseling Your health care provider may ask you questions about your: Medical history, including: Past medical problems. Family medical history. Pregnancy history. Current health, including: Menstrual cycle. Method of birth control. Emotional well-being. Home life and relationship well-being. Sexual activity and sexual health. Lifestyle, including: Alcohol, nicotine or tobacco, and drug use. Access to firearms. Diet, exercise, and sleep habits. Work and work environment. Sunscreen use. Safety issues such as seatbelt and bike helmet use. Physical exam Your health care provider will check your: Height and weight. These may be used to calculate your BMI (body mass index). BMI is a measurement that tells if you are at a healthy weight. Waist circumference. This measures the distance around your waistline. This measurement also tells if you are at a healthy weight and may help predict your risk of certain diseases, such as type 2 diabetes and high blood pressure. Heart rate and blood pressure. Body temperature. Skin for abnormal spots. What immunizations do I need?  Vaccines are usually given at various ages, according to a schedule. Your health care provider will recommend vaccines for you based on your age, medical history, and lifestyle or other factors, such as travel or where you work. What tests do I need? Screening Your health care provider may recommend screening tests for certain conditions. This may include: Lipid and cholesterol levels. Diabetes screening. This is done by checking your blood sugar (glucose) after you have not eaten for a while (fasting). Pelvic exam and Pap test. Hepatitis B test. Hepatitis C  test. HIV (human immunodeficiency virus) test. STI (sexually transmitted infection) testing, if you are at risk. Lung cancer screening. Colorectal cancer screening. Mammogram. Talk with your health care provider about when you should start having regular mammograms. This may depend on whether you have a family history of breast cancer. BRCA-related cancer screening. This may be done if you have a family history of breast, ovarian, tubal, or peritoneal cancers. Bone density scan. This is done to screen for osteoporosis. Talk with your health care provider about your test results, treatment options, and if necessary, the need for more tests. Follow these instructions at home: Eating and drinking  Eat a diet that includes fresh fruits and vegetables, whole grains, lean protein, and low-fat dairy products. Take vitamin and mineral supplements as recommended by your health care provider. Do not drink alcohol if: Your health care provider tells you not to drink. You are pregnant, may be pregnant, or are planning to become pregnant. If you drink alcohol: Limit how much you have to 0-1 drink a day. Know how much alcohol is in your drink. In the [place] one drink equals one 12 oz bottle of beer (355 mL), one 5 oz glass of wine (148 mL), or one 1 oz glass of hard liquor (44 mL). Lifestyle Brush your teeth every morning and night with fluoride toothpaste. Floss one time each day. Exercise for at least 30 minutes 5 or more days each week. Do not use any products that contain nicotine or tobacco. These products include cigarettes, chewing tobacco, and vaping devices, such as e-cigarettes. If you need help quitting, ask your health care provider. Do not use drugs. If you are sexually active, practice safe sex. Use a condom or other form of protection to   prevent STIs. If you do not wish to become pregnant, use a form of birth control. If you plan to become pregnant, see your health care provider for a  prepregnancy visit. Take aspirin only as told by your health care provider. Make sure that you understand how much to take and what form to take. Work with your health care provider to find out whether it is safe and beneficial for you to take aspirin daily. Find healthy ways to manage stress, such as: Meditation, yoga, or listening to music. Journaling. Talking to a trusted person. Spending time with friends and family. Minimize exposure to UV radiation to reduce your risk of skin cancer. Safety Always wear your seat belt while driving or riding in a vehicle. Do not drive: If you have been drinking alcohol. Do not ride with someone who has been drinking. When you are tired or distracted. While texting. If you have been using any mind-altering substances or drugs. Wear a helmet and other protective equipment during sports activities. If you have firearms in your house, make sure you follow all gun safety procedures. Seek help if you have been physically or sexually abused. What's next? Visit your health care provider once a year for an annual wellness visit. Ask your health care provider how often you should have your eyes and teeth checked. Stay up to date on all vaccines. This information is not intended to replace advice given to you by your health care provider. Make sure you discuss any questions you have with your health care provider. Document Revised: 11/30/2020 Document Reviewed: 11/30/2020 Elsevier Patient Education  [place].

## 2022-07-04 ENCOUNTER — Telehealth: Payer: Self-pay

## 2022-07-04 ENCOUNTER — Other Ambulatory Visit: Payer: Self-pay

## 2022-07-04 ENCOUNTER — Other Ambulatory Visit (HOSPITAL_COMMUNITY): Payer: Self-pay

## 2022-07-04 LAB — TSH: TSH: 0.87 u[IU]/mL (ref 0.35–5.50)

## 2022-07-04 LAB — COMPREHENSIVE METABOLIC PANEL
ALT: 42 U/L — ABNORMAL HIGH (ref 0–35)
AST: 28 U/L (ref 0–37)
Albumin: 4.5 g/dL (ref 3.5–5.2)
Alkaline Phosphatase: 98 U/L (ref 39–117)
BUN: 17 mg/dL (ref 6–23)
CO2: 26 mEq/L (ref 19–32)
Calcium: 9 mg/dL (ref 8.4–10.5)
Chloride: 105 mEq/L (ref 96–112)
Creatinine, Ser: 1.36 mg/dL — ABNORMAL HIGH (ref 0.40–1.20)
GFR: 43.17 mL/min — ABNORMAL LOW (ref >60.00)
Glucose, Bld: 90 mg/dL (ref 70–99)
Potassium: 3.5 mEq/L (ref 3.5–5.1)
Sodium: 140 mEq/L (ref 135–145)
Total Bilirubin: 0.4 mg/dL (ref 0.2–1.2)
Total Protein: 7.4 g/dL (ref 6.0–8.3)

## 2022-07-04 LAB — CBC WITH DIFFERENTIAL/PLATELET
Basophils Absolute: 0.1 10*3/uL (ref 0.0–0.1)
Basophils Relative: 1 % (ref 0.0–3.0)
Eosinophils Absolute: 0.3 10*3/uL (ref 0.0–0.7)
Eosinophils Relative: 3.3 % (ref 0.0–5.0)
HCT: 41.8 % (ref 36.0–46.0)
Hemoglobin: 13.7 g/dL (ref 12.0–15.0)
Lymphocytes Relative: 29.4 % (ref 12.0–46.0)
Lymphs Abs: 2.8 10*3/uL (ref 0.7–4.0)
MCHC: 32.8 g/dL (ref 30.0–36.0)
MCV: 83.8 fl (ref 78.0–100.0)
Monocytes Absolute: 0.7 10*3/uL (ref 0.1–1.0)
Monocytes Relative: 7.2 % (ref 3.0–12.0)
Neutro Abs: 5.5 10*3/uL (ref 1.4–7.7)
Neutrophils Relative %: 59.1 % (ref 43.0–77.0)
Platelets: 370 10*3/uL (ref 150.0–400.0)
RBC: 4.99 Mil/uL (ref 3.87–5.11)
RDW: 14.4 % (ref 11.5–15.5)
WBC: 9.4 10*3/uL (ref 4.0–10.5)

## 2022-07-04 LAB — LIPID PANEL
Cholesterol: 154 mg/dL (ref 0–200)
HDL: 53.4 mg/dL (ref >39.00)
NonHDL: 100.11
Total CHOL/HDL Ratio: 3
Triglycerides: 266 mg/dL — ABNORMAL HIGH (ref 0.0–149.0)
VLDL: 53.2 mg/dL — ABNORMAL HIGH (ref 0.0–40.0)

## 2022-07-04 LAB — LDL CHOLESTEROL, DIRECT: Direct LDL: 36 mg/dL

## 2022-07-04 LAB — HEMOGLOBIN A1C: Hgb A1c MFr Bld: 7.5 % — ABNORMAL HIGH (ref 4.6–6.5)

## 2022-07-04 NOTE — Telephone Encounter (Signed)
PA initiated via Covermymeds; KEY:   [id number]                              Awaiting determination.

## 2022-07-04 NOTE — Telephone Encounter (Signed)
PA approved.   The request has been approved. The authorization is effective from 07/04/2022 to 07/04/2023, as long as the member is enrolled in their current health plan. The request was approved with a quantity restriction. This has been approved for a max daily dosage of 0.07. A written notification letter will follow with additional details.

## 2022-07-05 ENCOUNTER — Other Ambulatory Visit: Payer: Self-pay

## 2022-07-05 DIAGNOSIS — E1165 Type 2 diabetes mellitus with hyperglycemia: Secondary | ICD-10-CM

## 2022-07-06 ENCOUNTER — Other Ambulatory Visit (HOSPITAL_COMMUNITY): Payer: Self-pay

## 2022-07-06 NOTE — Assessment & Plan Note (Signed)
hgba1c acceptable, minimize simple carbs. Increase exercise as tolerated. Continue current meds, patient is started on [facility]

## 2022-07-06 NOTE — Assessment & Plan Note (Signed)
Hydrate and monitor 

## 2022-07-06 NOTE — Assessment & Plan Note (Signed)
Patient encouraged to maintain heart healthy diet, regular exercise, adequate sleep. Consider daily probiotics. Take medications as prescribed. Labs ordered and reviewed. Given and reviewed copy of ACP documents from [place] and encouraged to complete and return, next [facility] ordered. Pap 2020 repeat pelvic 2025 Colonoscopy 2018 repeat in 2028

## 2022-07-06 NOTE — Assessment & Plan Note (Signed)
Encouraged DASH or MIND diet, decrease po intake and increase exercise as tolerated. Needs 7-8 hours of sleep nightly. Avoid trans fats, eat small, frequent meals every 4-5 hours with lean proteins, complex carbs and healthy fats. Minimize simple carbs, high fat foods and processed foods 

## 2022-07-06 NOTE — Assessment & Plan Note (Signed)
Encourage heart healthy diet such as MIND or DASH diet, increase exercise, avoid trans fats, simple carbohydrates and processed foods, consider a krill or fish or flaxseed oil cap daily. Tolerating Atorvastatin

## 2022-07-10 ENCOUNTER — Other Ambulatory Visit (HOSPITAL_COMMUNITY): Payer: Self-pay

## 2022-07-11 ENCOUNTER — Other Ambulatory Visit: Payer: Self-pay

## 2022-07-12 ENCOUNTER — Other Ambulatory Visit (HOSPITAL_COMMUNITY): Payer: Self-pay

## 2022-07-12 ENCOUNTER — Other Ambulatory Visit: Payer: Self-pay | Admitting: Family Medicine

## 2022-07-12 ENCOUNTER — Other Ambulatory Visit: Payer: Self-pay

## 2022-07-12 MED ORDER — POTASSIUM CHLORIDE CRYS ER 20 MEQ PO TBCR
EXTENDED_RELEASE_TABLET | ORAL | 1 refills | Status: DC
  Filled 2022-07-12: qty 114, 89d supply, fill #0
  Filled 2022-12-17: qty 114, 89d supply, fill #1

## 2022-07-12 MED ORDER — ATORVASTATIN CALCIUM 20 MG PO TABS
20.0000 mg | ORAL_TABLET | Freq: Every day | ORAL | 1 refills | Status: DC
  Filled 2022-07-12: qty 90, 90d supply, fill #0
  Filled 2022-12-17: qty 90, 90d supply, fill #1

## 2022-07-25 ENCOUNTER — Other Ambulatory Visit (HOSPITAL_COMMUNITY): Payer: Self-pay

## 2022-07-25 ENCOUNTER — Encounter: Payer: Self-pay | Admitting: Family Medicine

## 2022-07-25 MED ORDER — TIRZEPATIDE 7.5 MG/0.5ML ~~LOC~~ SOAJ
7.5000 mg | SUBCUTANEOUS | 0 refills | Status: DC
  Filled 2022-07-25 – 2022-07-30 (×3): qty 2, 28d supply, fill #0

## 2022-07-26 ENCOUNTER — Other Ambulatory Visit (HOSPITAL_COMMUNITY): Payer: Self-pay

## 2022-07-27 ENCOUNTER — Other Ambulatory Visit: Payer: Self-pay

## 2022-07-27 ENCOUNTER — Encounter: Payer: Self-pay | Admitting: Family Medicine

## 2022-07-27 ENCOUNTER — Other Ambulatory Visit (HOSPITAL_COMMUNITY): Payer: Self-pay

## 2022-07-27 MED ORDER — DEXCOM G6 RECEIVER DEVI
0 refills | Status: DC
  Filled 2022-07-27: qty 1, 90d supply, fill #0

## 2022-07-28 ENCOUNTER — Other Ambulatory Visit (HOSPITAL_COMMUNITY): Payer: Self-pay

## 2022-07-30 ENCOUNTER — Other Ambulatory Visit: Payer: Self-pay

## 2022-07-30 ENCOUNTER — Other Ambulatory Visit (HOSPITAL_COMMUNITY): Payer: Self-pay

## 2022-08-01 LAB — HM DIABETES EYE EXAM

## 2022-08-07 ENCOUNTER — Other Ambulatory Visit (HOSPITAL_COMMUNITY): Payer: Self-pay

## 2022-08-08 ENCOUNTER — Other Ambulatory Visit (HOSPITAL_COMMUNITY): Payer: Self-pay

## 2022-08-08 ENCOUNTER — Other Ambulatory Visit: Payer: Self-pay

## 2022-08-09 ENCOUNTER — Other Ambulatory Visit: Payer: Self-pay

## 2022-08-21 ENCOUNTER — Other Ambulatory Visit: Payer: Self-pay | Admitting: Family Medicine

## 2022-08-21 ENCOUNTER — Encounter: Payer: Self-pay | Admitting: Family Medicine

## 2022-08-21 ENCOUNTER — Other Ambulatory Visit: Payer: Self-pay

## 2022-08-21 ENCOUNTER — Other Ambulatory Visit (HOSPITAL_COMMUNITY): Payer: Self-pay

## 2022-08-21 DIAGNOSIS — E119 Type 2 diabetes mellitus without complications: Secondary | ICD-10-CM

## 2022-08-21 MED ORDER — DAPAGLIFLOZIN PROPANEDIOL 10 MG PO TABS
10.0000 mg | ORAL_TABLET | Freq: Every day | ORAL | 1 refills | Status: DC
  Filled 2022-08-21: qty 30, 30d supply, fill #0
  Filled 2022-11-13: qty 30, 30d supply, fill #1
  Filled 2022-12-10: qty 30, 30d supply, fill #2
  Filled 2023-01-31: qty 30, 30d supply, fill #3
  Filled 2023-02-26: qty 30, 30d supply, fill #4
  Filled 2023-04-02: qty 30, 30d supply, fill #5

## 2022-08-21 MED ORDER — LOSARTAN POTASSIUM 100 MG PO TABS
100.0000 mg | ORAL_TABLET | Freq: Every day | ORAL | 1 refills | Status: DC
  Filled 2022-08-21: qty 90, 90d supply, fill #0
  Filled 2022-11-13: qty 90, 90d supply, fill #1

## 2022-08-21 MED ORDER — CARVEDILOL 25 MG PO TABS
25.0000 mg | ORAL_TABLET | Freq: Two times a day (BID) | ORAL | 0 refills | Status: DC
  Filled 2022-08-21: qty 180, 90d supply, fill #0

## 2022-08-21 MED ORDER — SERTRALINE HCL 50 MG PO TABS
50.0000 mg | ORAL_TABLET | Freq: Every day | ORAL | 0 refills | Status: DC
  Filled 2022-08-21: qty 90, 90d supply, fill #0

## 2022-08-21 MED ORDER — MOUNJARO 7.5 MG/0.5ML ~~LOC~~ SOAJ
7.5000 mg | SUBCUTANEOUS | 0 refills | Status: DC
  Filled 2022-08-21 – 2022-08-22 (×2): qty 2, 28d supply, fill #0

## 2022-08-21 MED ORDER — TRETINOIN 0.025 % EX CREA
TOPICAL_CREAM | Freq: Every day | CUTANEOUS | 1 refills | Status: DC
  Filled 2022-08-21: qty 135, fill #0
  Filled 2022-09-25: qty 135, 90d supply, fill #0
  Filled 2022-12-21: qty 135, 90d supply, fill #1

## 2022-08-21 MED ORDER — DULOXETINE HCL 30 MG PO CPEP
60.0000 mg | ORAL_CAPSULE | Freq: Every day | ORAL | 1 refills | Status: DC
  Filled 2022-08-21: qty 180, 90d supply, fill #0
  Filled 2022-11-13: qty 180, 90d supply, fill #1

## 2022-08-21 MED ORDER — HYDROCHLOROTHIAZIDE 25 MG PO TABS
25.0000 mg | ORAL_TABLET | Freq: Every day | ORAL | 1 refills | Status: DC
  Filled 2022-08-21: qty 90, 90d supply, fill #0
  Filled 2022-11-13: qty 90, 90d supply, fill #1

## 2022-08-21 MED ORDER — OMEPRAZOLE 40 MG PO CPDR
40.0000 mg | DELAYED_RELEASE_CAPSULE | Freq: Every day | ORAL | 1 refills | Status: DC
  Filled 2022-08-21: qty 90, 90d supply, fill #0
  Filled 2022-11-13: qty 90, 90d supply, fill #1

## 2022-08-21 MED FILL — Albuterol Sulfate Inhal Aero 108 MCG/ACT (90MCG Base Equiv): RESPIRATORY_TRACT | 25 days supply | Qty: 6.7 | Fill #0 | Status: AC

## 2022-08-22 ENCOUNTER — Other Ambulatory Visit: Payer: Self-pay

## 2022-08-22 ENCOUNTER — Other Ambulatory Visit (HOSPITAL_COMMUNITY): Payer: Self-pay

## 2022-08-22 MED ORDER — MOUNJARO 7.5 MG/0.5ML ~~LOC~~ SOAJ
7.5000 mg | SUBCUTANEOUS | 0 refills | Status: DC
  Filled 2022-08-22: qty 2, 28d supply, fill #0

## 2022-08-23 ENCOUNTER — Encounter: Payer: Self-pay | Admitting: Dietician

## 2022-08-23 ENCOUNTER — Other Ambulatory Visit (HOSPITAL_COMMUNITY): Payer: Self-pay

## 2022-08-23 ENCOUNTER — Encounter: Payer: Commercial Managed Care - PPO | Attending: Internal Medicine | Admitting: Dietician

## 2022-08-23 VITALS — Wt 171.0 lb

## 2022-08-23 DIAGNOSIS — E1165 Type 2 diabetes mellitus with hyperglycemia: Secondary | ICD-10-CM | POA: Insufficient documentation

## 2022-08-23 NOTE — Patient Instructions (Addendum)
Consider PT to get recommendations for strength training and other exercise.  Great job on changes made! Continue to use CGM consistently. Continue to eat small regularly scheduled meals. Continue to choose protein with each meal.  Consider trying beano if you eat foods that cause gas such as cabbage, beans, etc.

## 2022-08-23 NOTE — Progress Notes (Signed)
Diabetes Self-Management Education  Visit Type: Follow-up  Appt. Start Time: 1600 Appt. End Time: [id number]  08/24/2022  Monique Wiggins, identified by name and date of birth, is a 57 y.o. female with a diagnosis of Diabetes:  .   ASSESSMENT Blood glucose improved, decreased craving and night snacking since staring [facility] January 21. She has kept a journal related to what foods she has tolerated and what she has not. Some problems with sulfur burps. Does not tolerate flax seeds (N/V/D) Feels satisfied with the amount that she is eating. Sleep has improved.  Back pain has improved by irritated after painter her bathroom.  History includes Type 2 diabetes, nausea after gallbladder removed 02/2021, HTN, HLD, OSA (dx years ago and no longer uses c-pap)   Poor sleep (3 hours per night), wakes frequently to use the restroom Back pain A1C  7.5% 07/03/2022, 7.3% 08/31/2021, cholesterol 106, HDL 45, Triglycerides 234 10/2020, GFR 62 08/31/2021  Medications include:  Farxiga, Mounjaro, glipizide, Vitamin B-12, vitamin D3, omega 3, zinc, vitamin C, magnesium, papaya enzymes, hair skin and nails, psyllium husk caps, potassium Dexcom G6 using a receiver as her new phone is not compatible.  Unable to download today.  CGM Results from download: 07/29/2022  % Time CGM active:   93 %   (Goal >70%)  Average glucose:   109 mg/dL for 14 days  Glucose management indicator:   5.9 %  Time in range (70-180 mg/dL):   99 %   (Goal >70%)  Time High (181-250 mg/dL):   0 %   (Goal < 25%)  Time Very High (>250 mg/dL):    0 %   (Goal < 5%)  Time Low (54-69 mg/dL):   <1 %   (Goal <4%)  Time Very Low (<54 mg/dL):   <1 %   (Goal <1%)   Weight hx: 171 lbs 08/23/2022 183 lbs 07/08/2022 190 lbs 05/24/2022 - lost weight but regained with Thanksgiving (9 sweet potato pies) 189 lbs 03/06/2022 183 lbs 01/2022 193 lbs 07/20/2021 192 lbs 05/18/2021 187 lbs 03/15/2021 171 lbs 01/11/2020 155 lbs lowest adult weight 2018 195 lbs  highest adult weight  10/17/2017 Goal weight 145 (when muscle mass was good but "may be different now")   Patient lives with her husband and mother.  They share shopping and cooking.  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  She is a Geophysicist/field seismologist (babies and dogs). Allergic to shellfish, flax seeds (N/V/D), most nuts cause discomfort and distress Does not tolerate red meat (except hamburger) Lactose intolerant Spinach and all greens causes diarrhea.  Lettuce does not cause symptoms and recently tolerated cooked kale. Doesn't tolerate raw onions Her husband is very supportive but enjoys feeding her and likes to snack at night. - States that she now understands. Increased stress this year. Planning on going to the gym with her husband.  Sister does wall palates and is considering this. Weight 171 lb (77.6 kg). Body mass index is 32.31 kg/m.   Diabetes Self-Management Education - 08/24/22 0932       Visit Information   Visit Type Follow-up      Psychosocial Assessment   Patient Belief/Attitude about Diabetes Motivated to manage diabetes    What is the hardest part about your diabetes right now, causing you the most concern, or is the most worrisome to you about your diabetes?   Being active    Self-care barriers None    Self-management support Doctor's office;Family;CDE  visits    Other persons present Patient    Patient Concerns Nutrition/Meal planning;Weight Control    Special Needs None    Preferred Learning Style No preference indicated    Learning Readiness Ready    How often do you need to have someone help you when you read instructions, pamphlets, or other written materials from your doctor or pharmacy? 1 - Never      Pre-Education Assessment   Patient understands the diabetes disease and treatment process. Demonstrates understanding / competency    Patient understands incorporating nutritional management into lifestyle. Comprehends key  points    Patient undertands incorporating physical activity into lifestyle. Comprehends key points    Patient understands using medications safely. Demonstrates understanding / competency    Patient understands monitoring blood glucose, interpreting and using results Demonstrates understanding / competency    Patient understands prevention, detection, and treatment of acute complications. Demonstrates understanding / competency    Patient understands prevention, detection, and treatment of chronic complications. Demonstrates understanding / competency    Patient understands how to develop strategies to address psychosocial issues. Demonstrates understanding / competency    Patient understands how to develop strategies to promote health/change behavior. Comprehends key points      Complications   Last HgB A1C per patient/outside source 7.5 %   07/03/22   How often do you check your blood sugar? > 4 times/day    Fasting Blood glucose range (mg/dL) 70-129    Postprandial Blood glucose range (mg/dL) 130-179;70-129    Number of hypoglycemic episodes per month 2    Can you tell when your blood sugar is low? Yes    What do you do if your blood sugar is low? eats      Dietary Intake   Breakfast protein shake ([employer]), pear    Snack (morning) skinny popcorn, 1/2 apple    Lunch salad with boiled egg, ranch, protein shake, apple    Snack (afternoon) none (usually protein bar)    Dinner [place], green beans, pear    Snack (evening) none    Beverage(s) 10 gram protein gatorade zero  Water,      Activity / Exercise   Activity / Exercise Type ADL's      Patient Education   Previous Diabetes Education Yes (please comment)   11/23   Healthy Eating Meal options for control of blood glucose level and chronic complications.    Being Active Role of exercise on diabetes management, blood pressure control and cardiac health.;Helped patient identify appropriate exercises in relation to his/her diabetes,  diabetes complications and other health issue.    Medications Reviewed patients medication for diabetes, action, purpose, timing of dose and side effects.    Monitoring Taught/evaluated CGM (comment)    Diabetes Stress and Support Identified and addressed patients feelings and concerns about diabetes      Individualized Goals (developed by patient)   Nutrition General guidelines for healthy choices and portions discussed    Physical Activity Exercise 3-5 times per week;30 minutes per day    Medications take my medication as prescribed    Monitoring  Consistenly use CGM    Problem Solving Other (comment)   exercise with time, motivation, and back issues   Reducing Risk examine blood glucose patterns      Patient Self-Evaluation of Goals - Patient rates self as meeting previously set goals (% of time)   Nutrition >75% (most of the time)    Physical Activity < 25% (hardly ever/never)  Medications >75% (most of the time)    Monitoring >75% (most of the time)    Problem Solving and behavior change strategies  >75% (most of the time)    Reducing Risk (treating acute and chronic complications) [id number] (most of the time)    Health Coping >75% (most of the time)      Post-Education Assessment   Patient understands the diabetes disease and treatment process. Demonstrates understanding / competency    Patient understands incorporating nutritional management into lifestyle. Demonstrates understanding / competency    Patient undertands incorporating physical activity into lifestyle. Demonstrates understanding / competency    Patient understands using medications safely. Demonstrates understanding / competency    Patient understands monitoring blood glucose, interpreting and using results Demonstrates understanding / competency    Patient understands prevention, detection, and treatment of acute complications. Demonstrates understanding / competency    Patient understands prevention, detection, and  treatment of chronic complications. Demonstrates understanding / competency    Patient understands how to develop strategies to address psychosocial issues. Demonstrates understanding / competency    Patient understands how to develop strategies to promote health/change behavior. Comprehends key points      Outcomes   Expected Outcomes Demonstrated interest in learning. Expect positive outcomes    Future DMSE 3-4 months    Program Status Not Completed      Subsequent Visit   Since your last visit have you continued or begun to take your medications as prescribed? Yes    Since your last visit have you experienced any weight changes? Loss    Weight Loss (lbs) 19    Since your last visit, are you checking your blood glucose at least once a day? Yes             Individualized Plan for Diabetes Self-Management Training:   Learning Objective:  Patient will have a greater understanding of diabetes self-management. Patient education plan is to attend individual and/or group sessions per assessed needs and concerns.   Plan:   Patient Instructions  Consider PT to get recommendations for strength training and other exercise.  Great job on changes made! Continue to use CGM consistently. Continue to eat small regularly scheduled meals. Continue to choose protein with each meal.  Consider trying beano if you eat foods that cause gas such as cabbage, beans, etc.     Expected Outcomes:  Demonstrated interest in learning. Expect positive outcomes  Education material provided:   If problems or questions, patient to contact team via:  Phone  Future DSME appointment: 3-4 months

## 2022-08-31 ENCOUNTER — Other Ambulatory Visit (HOSPITAL_COMMUNITY): Payer: Self-pay

## 2022-09-04 ENCOUNTER — Other Ambulatory Visit (HOSPITAL_COMMUNITY): Payer: Self-pay

## 2022-09-04 ENCOUNTER — Other Ambulatory Visit: Payer: Self-pay | Admitting: Family Medicine

## 2022-09-04 MED ORDER — CETIRIZINE HCL 10 MG PO TABS
10.0000 mg | ORAL_TABLET | Freq: Every day | ORAL | 3 refills | Status: DC
  Filled 2022-09-04: qty 90, 90d supply, fill #0
  Filled 2022-12-17: qty 90, 90d supply, fill #1
  Filled 2023-03-15: qty 90, 90d supply, fill #2
  Filled 2023-04-08: qty 90, 90d supply, fill #3

## 2022-09-05 ENCOUNTER — Other Ambulatory Visit (HOSPITAL_COMMUNITY): Payer: Self-pay

## 2022-09-12 ENCOUNTER — Ambulatory Visit
Admission: RE | Admit: 2022-09-12 | Discharge: 2022-09-12 | Disposition: A | Discharge status: Home / Self Care | Payer: Commercial Managed Care - PPO | Source: Ambulatory Visit | Attending: Family Medicine | Admitting: Family Medicine

## 2022-09-12 DIAGNOSIS — Z1231 Encounter for screening mammogram for malignant neoplasm of breast: Secondary | ICD-10-CM | POA: Diagnosis not present

## 2022-09-17 NOTE — Assessment & Plan Note (Signed)
hgba1c acceptable, minimize simple carbs. Increase exercise as tolerated. Continue current meds, patient is started on Mounjaro 

## 2022-09-17 NOTE — Assessment & Plan Note (Signed)
Encourage heart healthy diet such as MIND or DASH diet, increase exercise, avoid trans fats, simple carbohydrates and processed foods, consider a krill or fish or flaxseed oil cap daily. Tolerating Atorvastatin 

## 2022-09-17 NOTE — Assessment & Plan Note (Signed)
Hydrate and monitor 

## 2022-09-17 NOTE — Assessment & Plan Note (Signed)
Encouraged DASH or MIND diet, decrease po intake and increase exercise as tolerated. Needs 7-8 hours of sleep nightly. Avoid trans fats, eat small, frequent meals every 4-5 hours with lean proteins, complex carbs and healthy fats. Minimize simple carbs, high fat foods and processed foods tolerating Mounjaro will increase to 10 mg next month

## 2022-09-18 ENCOUNTER — Other Ambulatory Visit: Payer: Self-pay | Admitting: Family Medicine

## 2022-09-18 ENCOUNTER — Ambulatory Visit: Payer: Commercial Managed Care - PPO | Admitting: Family Medicine

## 2022-09-18 ENCOUNTER — Ambulatory Visit (HOSPITAL_BASED_OUTPATIENT_CLINIC_OR_DEPARTMENT_OTHER)
Admission: RE | Admit: 2022-09-18 | Discharge: 2022-09-18 | Disposition: A | Discharge status: Home / Self Care | Payer: Commercial Managed Care - PPO | Source: Ambulatory Visit | Attending: Family Medicine | Admitting: Family Medicine

## 2022-09-18 VITALS — BP 116/70 | HR 72 | Temp 98.0°F | Resp 16 | Ht 61.0 in | Wt 165.0 lb

## 2022-09-18 DIAGNOSIS — E785 Hyperlipidemia, unspecified: Secondary | ICD-10-CM | POA: Diagnosis not present

## 2022-09-18 DIAGNOSIS — N289 Disorder of kidney and ureter, unspecified: Secondary | ICD-10-CM

## 2022-09-18 DIAGNOSIS — E669 Obesity, unspecified: Secondary | ICD-10-CM | POA: Diagnosis not present

## 2022-09-18 DIAGNOSIS — M952 Other acquired deformity of head: Secondary | ICD-10-CM | POA: Diagnosis not present

## 2022-09-18 DIAGNOSIS — E1165 Type 2 diabetes mellitus with hyperglycemia: Secondary | ICD-10-CM

## 2022-09-18 DIAGNOSIS — M899 Disorder of bone, unspecified: Secondary | ICD-10-CM | POA: Diagnosis not present

## 2022-09-18 MED ORDER — TIRZEPATIDE 10 MG/0.5ML ~~LOC~~ SOAJ: 10.0000 mg | SUBCUTANEOUS | 1 refills | Status: DC
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
## 2022-09-18 NOTE — Patient Instructions (Signed)
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
## 2022-09-19 ENCOUNTER — Ambulatory Visit (HOSPITAL_BASED_OUTPATIENT_CLINIC_OR_DEPARTMENT_OTHER)
Admission: RE | Admit: 2022-09-19 | Discharge: 2022-09-19 | Disposition: A | Discharge status: Home / Self Care | Payer: Commercial Managed Care - PPO | Source: Ambulatory Visit | Attending: Family Medicine | Admitting: Family Medicine

## 2022-09-19 DIAGNOSIS — M952 Other acquired deformity of head: Secondary | ICD-10-CM | POA: Diagnosis not present

## 2022-09-19 DIAGNOSIS — S0990XA Unspecified injury of head, initial encounter: Secondary | ICD-10-CM | POA: Diagnosis not present

## 2022-09-20 ENCOUNTER — Encounter: Payer: Self-pay | Admitting: Family Medicine

## 2022-09-21 ENCOUNTER — Other Ambulatory Visit: Payer: Self-pay | Admitting: Family Medicine

## 2022-09-21 DIAGNOSIS — D164 Benign neoplasm of bones of skull and face: Secondary | ICD-10-CM

## 2022-09-25 ENCOUNTER — Other Ambulatory Visit: Payer: Self-pay | Admitting: Family Medicine

## 2022-09-25 ENCOUNTER — Other Ambulatory Visit: Payer: Self-pay

## 2022-09-25 ENCOUNTER — Encounter: Payer: Self-pay | Admitting: Family Medicine

## 2022-09-25 ENCOUNTER — Other Ambulatory Visit (HOSPITAL_COMMUNITY): Payer: Self-pay

## 2022-09-25 MED ORDER — MOMETASONE FUROATE 0.1 % EX CREA
1.0000 | TOPICAL_CREAM | Freq: Every day | CUTANEOUS | 1 refills | Status: DC
  Filled 2022-09-25: qty 135, 30d supply, fill #0
  Filled 2023-05-27 – 2023-06-10 (×2): qty 135, 30d supply, fill #1

## 2022-09-25 MED ORDER — TIRZEPATIDE 10 MG/0.5ML ~~LOC~~ SOAJ
10.0000 mg | SUBCUTANEOUS | 1 refills | Status: DC
  Filled 2022-09-25: qty 2, 28d supply, fill #0
  Filled 2022-10-23 (×2): qty 2, 28d supply, fill #1

## 2022-10-01 ENCOUNTER — Encounter: Payer: Self-pay | Admitting: *Deleted

## 2022-10-01 DIAGNOSIS — D164 Benign neoplasm of bones of skull and face: Secondary | ICD-10-CM | POA: Diagnosis not present

## 2022-10-04 ENCOUNTER — Other Ambulatory Visit: Payer: Self-pay | Admitting: Neurosurgery

## 2022-10-04 ENCOUNTER — Ambulatory Visit: Payer: Commercial Managed Care - PPO | Admitting: Family Medicine

## 2022-10-08 IMAGING — MR MR ABDOMEN WO/W CM
17 of 18 series · 44 of 48 positions shown · IV contrast (8 mL Gadavist)
Comparison: Abdominal ultrasound November 10, 2020

CLINICAL DATA: Further characterization of hepatic lesions seen on
prior ultrasound

EXAM:
MRI ABDOMEN WITHOUT AND WITH CONTRAST
TECHNIQUE: Multiplanar multisequence MR imaging of the abdomen was performed
both before and after the administration of intravenous contrast.
CONTRAST:  8mL GADAVIST GADOBUTROL 1 MMOL/ML IV SOLN

[Series 2: cor ssfse / · coronal · 7.0mm · 1.48mm/px · 2 of 30 slices shown]
[im 1/30]
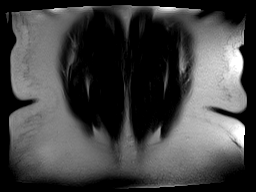
[im 30/30]
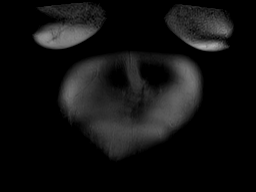

[Series 3: T2 fat-sat · axial · 6.0mm · 1.48mm/px · z∈[-31,+177]mm · 2 of 30 slices shown]
[im 1/30]
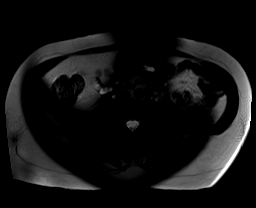
[im 30/30]
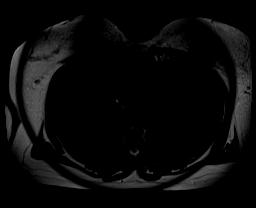

[Series 4: T1 · axial · 6.0mm · 0.74mm/px · z∈[-35,+181]mm · 3 of 62 slices shown]
[im 1/62]
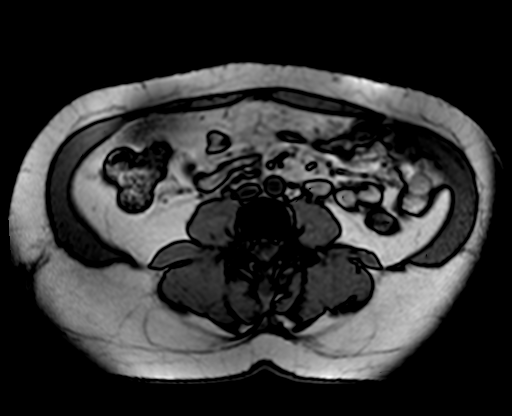
[im 31/62]
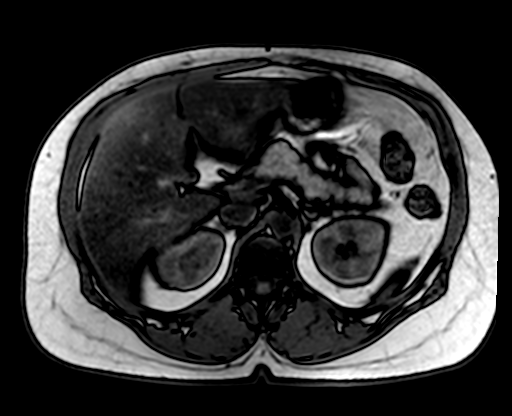
[im 62/62]
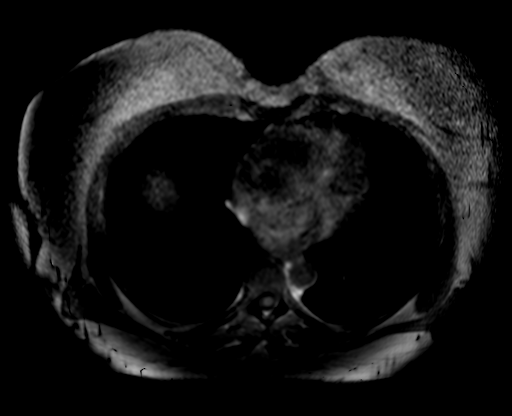

[Series 7: DWI · axial · 6.0mm · 2.00mm/px · z∈[-43,+238]mm · 5 of 120 slices shown (1 of 2)]
[im 1/120]
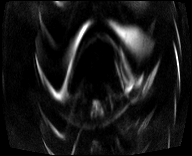
[im 30/120]
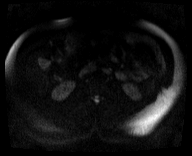
[im 60/120]
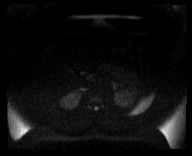
[im 90/120]
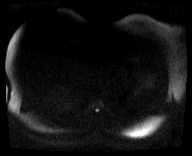
[im 120/120]
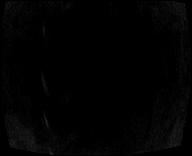

[Series 8: DWI · axial · 6.0mm · 2.00mm/px · z∈[-43,+238]mm · 2 of 40 slices shown (2 of 2)]
[im 1/40]
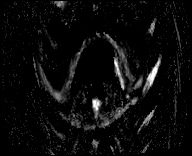
[im 40/40]
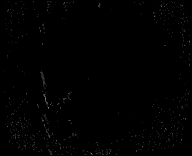

[Series 9: bSSFP · axial · 6.0mm · 0.74mm/px · 1 of 33 slices shown (1 of 2)]
[im 1/33]
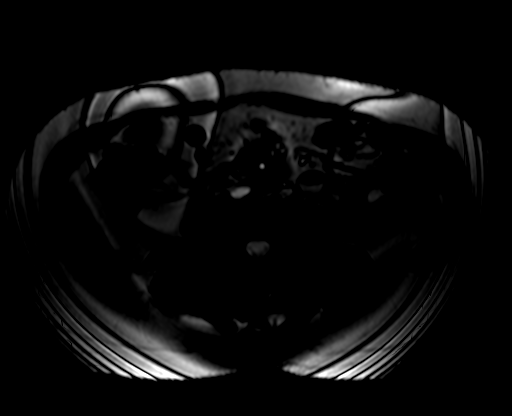

[Series 10: axial ssfse / · axial · 6.0mm · 1.19mm/px · 1 of 33 slices shown]
[im 1/33]
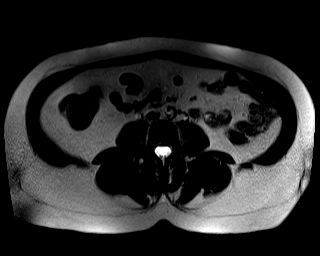

[Series 11: bSSFP · axial · 6.0mm · 0.76mm/px · 1 of 35 slices shown (2 of 2)]
[im 1/35]
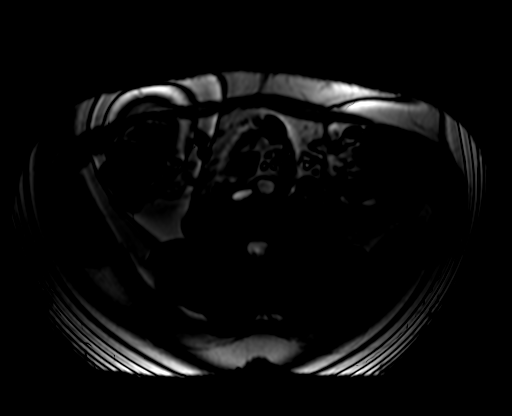

[Series 12: axial dynamic pre · axial · non-contrast · 3.0mm · 1.25mm/px · z∈[-33,+204]mm · 3 of 80 slices shown]
[im 1/80]
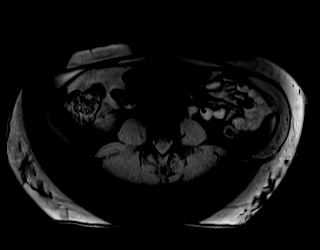
[im 40/80]
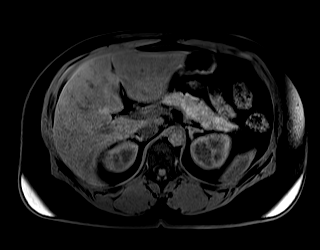
[im 80/80]
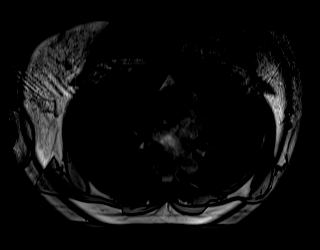

[Series 13: axial dynamic post · axial · 3.0mm · 1.25mm/px · z∈[-33,+204]mm · 3 of 80 slices shown (1 of 6)]
[im 1/80]
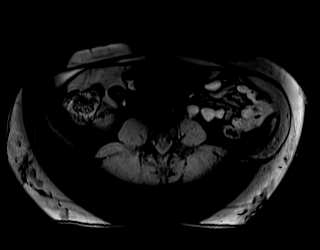
[im 40/80]
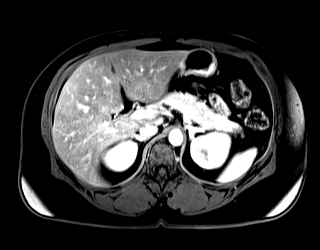
[im 80/80]
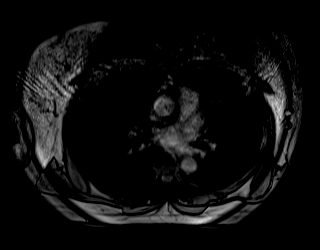

[Series 14: axial dynamic post · axial · 3.0mm · 1.25mm/px · z∈[-33,+204]mm · 3 of 80 slices shown (2 of 6)]
[im 1/80]
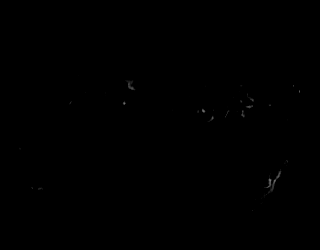
[im 40/80]
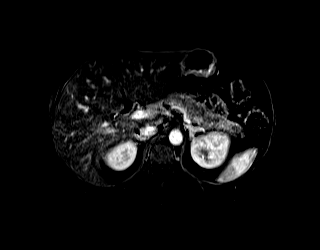
[im 80/80]
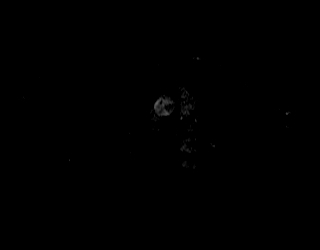

[Series 15: axial dynamic post · axial · 3.0mm · 1.25mm/px · z∈[-33,+204]mm · 3 of 80 slices shown (3 of 6)]
[im 1/80]
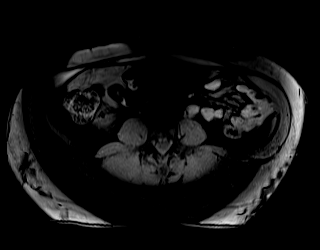
[im 40/80]
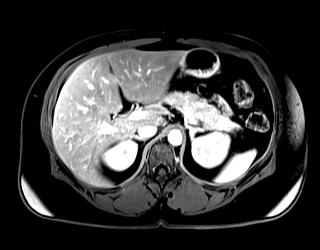
[im 80/80]
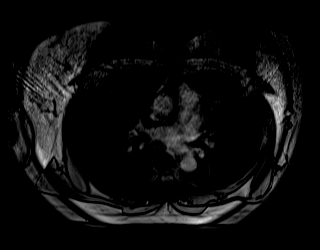

[Series 16: axial dynamic post · axial · 3.0mm · 1.25mm/px · z∈[-33,+204]mm · 3 of 80 slices shown (4 of 6)]
[im 1/80]
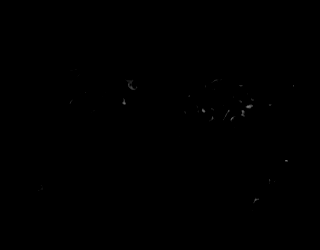
[im 40/80]
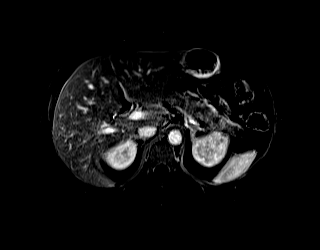
[im 80/80]
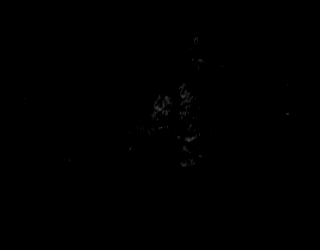

[Series 17: axial dynamic post · axial · 3.0mm · 1.25mm/px · z∈[-33,+204]mm · 3 of 80 slices shown (5 of 6)]
[im 1/80]
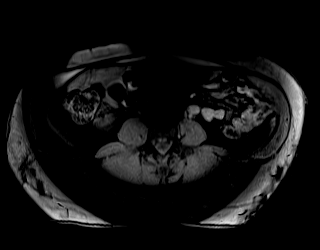
[im 40/80]
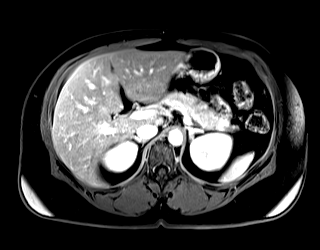
[im 80/80]
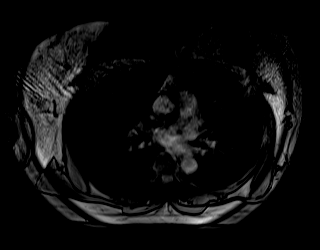

[Series 18: axial dynamic post · axial · 3.0mm · 1.25mm/px · z∈[-33,+204]mm · 3 of 80 slices shown (6 of 6)]
[im 1/80]
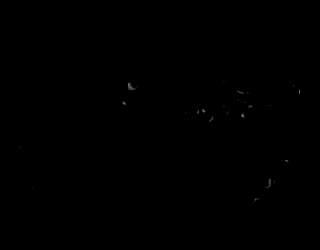
[im 40/80]
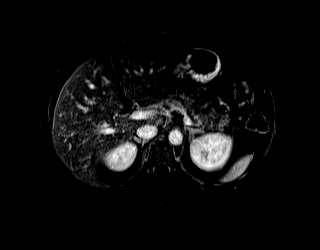
[im 80/80]
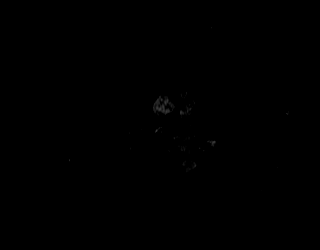

[Series 20: axial dynamic delayed · axial · 3.0mm · 1.25mm/px · z∈[-33,+204]mm · 3 of 80 slices shown]
[im 1/80]
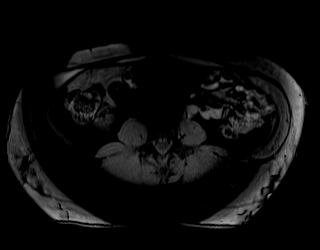
[im 40/80]
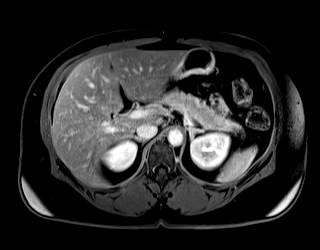
[im 80/80]
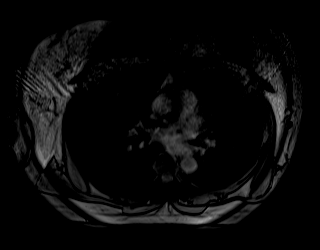

[Series 21: axial dynamic delayed_sub · axial · 3.0mm · 1.25mm/px · z∈[-33,+204]mm · 3 of 80 slices shown]
[im 1/80]
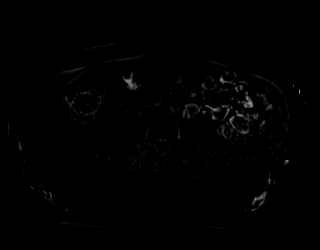
[im 40/80]
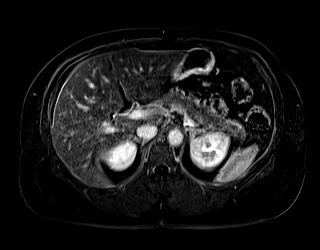
[im 80/80]
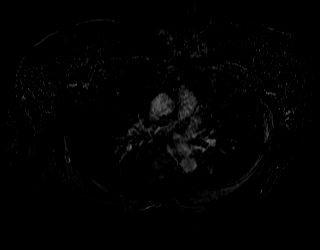

[44 of 48 positions shown; findings below may reference images not displayed]

FINDINGS: Lower chest: No acute findings.

Hepatobiliary: There is moderate loss of signal throughout the
hepatic parenchyma on out of phase imaging consistent with hepatic
steatosis. Along the gallbladder fossa there are multiple areas the
do not demonstrate signal loss on out of phase imaging measuring up
to 3.6 cm in maximum diameter corresponding with the hypoechoic
areas seen on prior ultrasound, consistent with focal fatty sparing.
No solid enhancing hepatic lesion. Gallbladder is unremarkable. No
biliary ductal dilation.

Pancreas: No mass, inflammatory changes, or other parenchymal
abnormality identified.

Spleen: No splenomegaly. There are two T2 hyperintense splenic
lesions largest of which measures 10 cm which demonstrates
peripheral nodular enhancement most consistent with benign splenic
hemangiomas.

Adrenals/Urinary Tract: Bilateral adrenals are unremarkable. No
hydronephrosis. Small bilateral renal cysts. No solid enhancing
renal lesions.

Stomach/Bowel: Visualized portions within the abdomen are
unremarkable.

Vascular/Lymphatic: No pathologically enlarged lymph nodes
identified. No abdominal aortic aneurysm demonstrated.

Other:  No abdominal ascites.

Musculoskeletal: No suspicious bone lesions identified.
IMPRESSION: 1. Moderate hepatic steatosis with areas of focal fatty sparing
along the gallbladder fossa which correspond with lesions seen on
prior ultrasound. No suspicious hepatic lesion.
2. Benign splenic hemangiomas.
3. Small bilateral renal cysts.

## 2022-10-09 ENCOUNTER — Other Ambulatory Visit (INDEPENDENT_AMBULATORY_CARE_PROVIDER_SITE_OTHER): Payer: Commercial Managed Care - PPO

## 2022-10-09 DIAGNOSIS — E876 Hypokalemia: Secondary | ICD-10-CM

## 2022-10-09 DIAGNOSIS — E1165 Type 2 diabetes mellitus with hyperglycemia: Secondary | ICD-10-CM | POA: Diagnosis not present

## 2022-10-09 LAB — COMPREHENSIVE METABOLIC PANEL
ALT: 35 U/L (ref 0–35)
AST: 21 U/L (ref 0–37)
Albumin: 4.6 g/dL (ref 3.5–5.2)
Alkaline Phosphatase: 75 U/L (ref 39–117)
BUN: 35 mg/dL — ABNORMAL HIGH (ref 6–23)
CO2: 25 mEq/L (ref 19–32)
Calcium: 9.3 mg/dL (ref 8.4–10.5)
Chloride: 103 mEq/L (ref 96–112)
Creatinine, Ser: 1.31 mg/dL — ABNORMAL HIGH (ref 0.40–1.20)
GFR: 45.07 mL/min — ABNORMAL LOW (ref >60.00)
Glucose, Bld: 83 mg/dL (ref 70–99)
Potassium: 3.7 mEq/L (ref 3.5–5.1)
Sodium: 137 mEq/L (ref 135–145)
Total Bilirubin: 0.4 mg/dL (ref 0.2–1.2)
Total Protein: 7.2 g/dL (ref 6.0–8.3)

## 2022-10-09 LAB — HEMOGLOBIN A1C: Hgb A1c MFr Bld: 6.1 % (ref 4.6–6.5)

## 2022-10-11 NOTE — Pre-Procedure Instructions (Signed)
Surgical Instructions    Your procedure is scheduled on Friday, May 3rd.  Report to [facility] Main Entrance "A" at 07:00 A.M., then check in with the Admitting office.  Call this number if you have problems the morning of surgery:  [phone]  If you have any questions prior to your surgery date call [phone]: Open Monday-Friday 8am-4pm If you experience any cold or flu symptoms such as cough, fever, chills, shortness of breath, etc. between now and your scheduled surgery, please notify us at the above number.     Remember:  Do not eat after midnight the night before your surgery  You may drink clear liquids until 06:00 AM the morning of your surgery.   Clear liquids allowed are: Water, Non-Citrus Juices (without pulp), Carbonated Beverages, Clear Tea, Black Coffee Only (NO MILK, CREAM OR POWDERED CREAMER of any kind), and Gatorade.    Take these medicines the morning of surgery with A SIP OF WATER  amLODipine (NORVASC)  atorvastatin (LIPITOR)  carvedilol (COREG)  cetirizine (ZYRTEC)  DULoxetine (CYMBALTA)  fluticasone-salmeterol (ADVAIR DISKUS)  omeprazole (PRILOSEC)  topiramate (TOPAMAX)    If needed: albuterol (VENTOLIN HFA)- bring inhaler with you on day of surgery Budeson-Glycopyrrol-Formoterol (BREZTRI AEROSPHERE)  dicyclomine (BENTYL)  fluticasone (FLONASE)  ondansetron (ZOFRAN-ODT)  Polyvinyl Alcohol-Povidone (CLEAR EYES NATURAL TEARS OP)    As of today, STOP taking any Aspirin (unless otherwise instructed by your surgeon) Aleve, Naproxen, Ibuprofen, Motrin, Advil, Goody's, BC's, all herbal medications, fish oil, and all vitamins.  WHAT DO I DO ABOUT MY DIABETES MEDICATION?   Do not take glipiZIDE (GLUCOTROL) the morning of surgery.  Hold dapagliflozin propanediol (FARXIGA) 72 hours prior to surgery. Last dose 4/29.     Stop taking tirzepatide [facility]) 7 days prior to surgery.    HOW TO MANAGE YOUR DIABETES BEFORE AND AFTER SURGERY  Why is it  important to control my blood sugar before and after surgery? Improving blood sugar levels before and after surgery helps healing and can limit problems. A way of improving blood sugar control is eating a healthy diet by:  Eating less sugar and carbohydrates  Increasing activity/exercise  Talking with your doctor about reaching your blood sugar goals High blood sugars (greater than 180 mg/dL) can raise your risk of infections and slow your recovery, so you will need to focus on controlling your diabetes during the weeks before surgery. Make sure that the doctor who takes care of your diabetes knows about your planned surgery including the date and location.  How do I manage my blood sugar before surgery? Check your blood sugar at least 4 times a day, starting 2 days before surgery, to make sure that the level is not too high or low.  Check your blood sugar the morning of your surgery when you wake up and every 2 hours until you get to the Short Stay unit.  If your blood sugar is less than 70 mg/dL, you will need to treat for low blood sugar: Do not take insulin. Treat a low blood sugar (less than 70 mg/dL) with  cup of clear juice (cranberry or apple), 4 glucose tablets, OR glucose gel. Recheck blood sugar in 15 minutes after treatment (to make sure it is greater than 70 mg/dL). If your blood sugar is not greater than 70 mg/dL on recheck, call [phone] for further instructions. Report your blood sugar to the short stay nurse when you get to Short Stay.  If you are admitted to the hospital after surgery: Your  blood sugar will be checked by the staff and you will probably be given insulin after surgery (instead of oral diabetes medicines) to make sure you have good blood sugar levels. The goal for blood sugar control after surgery is 80-180 mg/dL.                     Do NOT Smoke (Tobacco/Vaping) for 24 hours prior to your procedure.  If you use a CPAP at night, you may bring your  mask/headgear for your overnight stay.   Contacts, glasses, piercing's, hearing aid's, dentures or partials may not be worn into surgery, please bring cases for these belongings.    For patients admitted to the hospital, discharge time will be determined by your treatment team.   Patients discharged the day of surgery will not be allowed to drive home, and someone needs to stay with them for 24 hours.  SURGICAL WAITING ROOM VISITATION Patients having surgery or a procedure may have no more than 2 support people in the waiting area - these visitors may rotate.   Children under the age of 52 must have an adult with them who is not the patient. If the patient needs to stay at the hospital during part of their recovery, the visitor guidelines for inpatient rooms apply. Pre-op nurse will coordinate an appropriate time for 1 support person to accompany patient in pre-op.  This support person may not rotate.   Please refer to the [facility] website for the visitor guidelines for Inpatients (after your surgery is over and you are in a regular room).    Special instructions:   [place]- Preparing For Surgery  Before surgery, you can play an important role. Because skin is not sterile, your skin needs to be as free of germs as possible. You can reduce the number of germs on your skin by washing with CHG (chlorahexidine gluconate) Soap before surgery.  CHG is an antiseptic cleaner which kills germs and bonds with the skin to continue killing germs even after washing.    Oral Hygiene is also important to reduce your risk of infection.  Remember - BRUSH YOUR TEETH THE MORNING OF SURGERY WITH YOUR REGULAR TOOTHPASTE  Please do not use if you have an allergy to CHG or antibacterial soaps. If your skin becomes reddened/irritated stop using the CHG.  Do not shave (including legs and underarms) for at least 48 hours prior to first CHG shower. It is OK to shave your face.  Please follow these instructions  carefully.   Shower the NIGHT BEFORE SURGERY and the MORNING OF SURGERY  If you chose to wash your hair, wash your hair first as usual with your normal shampoo.  After you shampoo, rinse your hair and body thoroughly to remove the shampoo.  Use CHG Soap as you would any other liquid soap. You can apply CHG directly to the skin and wash gently with a scrungie or a clean washcloth.   Apply the CHG Soap to your body ONLY FROM THE NECK DOWN.  Do not use on open wounds or open sores. Avoid contact with your eyes, ears, mouth and genitals (private parts). Wash Face and genitals (private parts)  with your normal soap.   Wash thoroughly, paying special attention to the area where your surgery will be performed.  Thoroughly rinse your body with warm water from the neck down.  DO NOT shower/wash with your normal soap after using and rinsing off the CHG Soap.  Pat yourself  dry with a CLEAN TOWEL.  Wear CLEAN PAJAMAS to bed the night before surgery  Place CLEAN SHEETS on your bed the night before your surgery  DO NOT SLEEP WITH PETS.   Day of Surgery: Take a shower with CHG soap. Do not wear jewelry or makeup Do not wear lotions, powders, perfumes, or deodorant. Do not shave 48 hours prior to surgery.   Do not bring valuables to the hospital. [facility] is not responsible for any belongings or valuables. Do not wear nail polish, gel polish, artificial nails, or any other type of covering on natural nails (fingers and toes) If you have artificial nails or gel coating that need to be removed by a nail salon, please have this removed prior to surgery. Artificial nails or gel coating may interfere with anesthesia's ability to adequately monitor your vital signs. Wear Clean/Comfortable clothing the morning of surgery Remember to brush your teeth WITH YOUR REGULAR TOOTHPASTE.   Please read over the following fact sheets that you were given.    If you received a COVID test during your pre-op  visit  it is requested that you wear a mask when out in public, stay away from anyone that may not be feeling well and notify your surgeon if you develop symptoms. If you have been in contact with anyone that has tested positive in the last 10 days please notify you surgeon.

## 2022-10-12 ENCOUNTER — Encounter (HOSPITAL_COMMUNITY): Payer: Self-pay

## 2022-10-12 ENCOUNTER — Other Ambulatory Visit: Payer: Self-pay

## 2022-10-12 ENCOUNTER — Encounter (HOSPITAL_COMMUNITY)
Admission: RE | Admit: 2022-10-12 | Discharge: 2022-10-12 | Disposition: A | Discharge status: Home / Self Care | Payer: Commercial Managed Care - PPO | Source: Ambulatory Visit | Attending: Neurosurgery | Admitting: Neurosurgery

## 2022-10-12 VITALS — BP 113/70 | HR 71 | Temp 97.9°F | Resp 18 | Ht 61.0 in | Wt 161.4 lb

## 2022-10-12 DIAGNOSIS — Z01818 Encounter for other preprocedural examination: Secondary | ICD-10-CM

## 2022-10-12 DIAGNOSIS — Z01812 Encounter for preprocedural laboratory examination: Secondary | ICD-10-CM | POA: Insufficient documentation

## 2022-10-12 DIAGNOSIS — E1165 Type 2 diabetes mellitus with hyperglycemia: Secondary | ICD-10-CM | POA: Insufficient documentation

## 2022-10-12 LAB — CBC
HCT: 42.3 % (ref 36.0–46.0)
Hemoglobin: 13.8 g/dL (ref 12.0–15.0)
MCH: 27.4 pg (ref 26.0–34.0)
MCHC: 32.6 g/dL (ref 30.0–36.0)
MCV: 83.9 fL (ref 80.0–100.0)
Platelets: 347 10*3/uL (ref 150–400)
RBC: 5.04 MIL/uL (ref 3.87–5.11)
RDW: 14.4 % (ref 11.5–15.5)
WBC: 8.5 10*3/uL (ref 4.0–10.5)
nRBC: 0 % (ref 0.0–0.2)

## 2022-10-12 LAB — TYPE AND SCREEN
ABO/RH(D): O POS
Antibody Screen: NEGATIVE

## 2022-10-12 LAB — GLUCOSE, CAPILLARY: Glucose-Capillary: 74 mg/dL (ref 70–99)

## 2022-10-12 NOTE — Progress Notes (Signed)
PCP - Dr. Danise Edge Cardiologist - denies  PPM/ICD - denies   Chest x-ray - 04/05/22 EKG - 03/13/22 Stress Test - denies ECHO - 03/29/21 Cardiac Cath - denies  Sleep Study - denies. Pt states that she was OSA+ many years ago but never had any issues after she lost weight CPAP - denies  Fasting Blood Sugar - 100-120 Checks Blood Sugar continuously (sensor)  Last dose of GLP1 agonist-  10/07/22 GLP1 instructions: Hold Mounjaro 7 days  Blood Thinner Instructions: n/a Aspirin Instructions: f/u with surgeon   ERAS Protcol - yes, no drink   COVID TEST- n/a   Anesthesia review: no  Patient denies shortness of breath, fever, cough and chest pain at PAT appointment   All instructions explained to the patient, with a verbal understanding of the material. Patient agrees to go over the instructions while at home for a better understanding.  The opportunity to ask questions was provided.

## 2022-10-15 ENCOUNTER — Other Ambulatory Visit (HOSPITAL_COMMUNITY): Payer: Self-pay

## 2022-10-17 ENCOUNTER — Other Ambulatory Visit (HOSPITAL_COMMUNITY): Payer: Self-pay

## 2022-10-18 ENCOUNTER — Other Ambulatory Visit: Payer: Self-pay | Admitting: Family Medicine

## 2022-10-18 ENCOUNTER — Other Ambulatory Visit (HOSPITAL_COMMUNITY): Payer: Self-pay

## 2022-10-18 MED ORDER — DEXCOM G6 SENSOR MISC
3 refills | Status: DC
  Filled 2022-10-18: qty 3, 30d supply, fill #0
  Filled 2022-11-13: qty 3, 30d supply, fill #1
  Filled 2022-11-24 – 2023-01-07 (×2): qty 3, 30d supply, fill #2
  Filled 2023-01-31: qty 3, 30d supply, fill #3

## 2022-10-18 MED ORDER — DEXCOM G6 TRANSMITTER MISC
0 refills | Status: DC
  Filled 2022-10-18: qty 1, 30d supply, fill #0

## 2022-10-18 NOTE — Anesthesia Preprocedure Evaluation (Addendum)
Anesthesia Evaluation  Patient identified by MRN, date of birth, ID band Patient awake    Reviewed: Allergy & Precautions, NPO status , Patient's Chart, lab work & pertinent test results  Airway Mallampati: II  TM Distance: >3 FB Neck ROM: Full    Dental   Pulmonary sleep apnea    breath sounds clear to auscultation       Cardiovascular hypertension, Pt. on medications and Pt. on home beta blockers  Rhythm:Regular Rate:Normal     Neuro/Psych  Headaches  Neuromuscular disease    GI/Hepatic Neg liver ROS,GERD  ,,  Endo/Other  diabetes, Type 2    Renal/GU Renal InsufficiencyRenal disease     Musculoskeletal   Abdominal   Peds  Hematology  (+) Blood dyscrasia, Sickle cell trait   Anesthesia Other Findings   Reproductive/Obstetrics                             Anesthesia Physical Anesthesia Plan  ASA: 2  Anesthesia Plan: General   Post-op Pain Management: Tylenol PO (pre-op)*   Induction: Intravenous  PONV Risk Score and Plan: 3 and Dexamethasone, Ondansetron, Midazolam and Treatment may vary due to age or medical condition  Airway Management Planned: Oral ETT  Additional Equipment: None  Intra-op Plan:   Post-operative Plan: Extubation in OR  Informed Consent: I have reviewed the patients History and Physical, chart, labs and discussed the procedure including the risks, benefits and alternatives for the proposed anesthesia with the patient or authorized representative who has indicated his/her understanding and acceptance.     Dental advisory given  Plan Discussed with: CRNA  Anesthesia Plan Comments:        Anesthesia Quick Evaluation

## 2022-10-19 ENCOUNTER — Other Ambulatory Visit (HOSPITAL_COMMUNITY): Payer: Self-pay

## 2022-10-19 ENCOUNTER — Encounter (HOSPITAL_COMMUNITY): Payer: Self-pay | Admitting: Neurosurgery

## 2022-10-19 ENCOUNTER — Ambulatory Visit (HOSPITAL_COMMUNITY)
Admission: RE | Admit: 2022-10-19 | Discharge: 2022-10-19 | Disposition: A | Discharge status: Home / Self Care | Payer: Commercial Managed Care - PPO | Source: Ambulatory Visit | Attending: Neurosurgery | Admitting: Neurosurgery

## 2022-10-19 ENCOUNTER — Inpatient Hospital Stay (HOSPITAL_COMMUNITY): Payer: Commercial Managed Care - PPO | Admitting: Anesthesiology

## 2022-10-19 ENCOUNTER — Encounter (HOSPITAL_COMMUNITY)
Admission: RE | Disposition: A | Discharge status: Home / Self Care | Payer: Self-pay | Source: Ambulatory Visit | Attending: Neurosurgery

## 2022-10-19 ENCOUNTER — Other Ambulatory Visit: Payer: Self-pay

## 2022-10-19 ENCOUNTER — Inpatient Hospital Stay (HOSPITAL_BASED_OUTPATIENT_CLINIC_OR_DEPARTMENT_OTHER): Payer: Commercial Managed Care - PPO | Admitting: Anesthesiology

## 2022-10-19 DIAGNOSIS — Z7951 Long term (current) use of inhaled steroids: Secondary | ICD-10-CM | POA: Diagnosis not present

## 2022-10-19 DIAGNOSIS — G4730 Sleep apnea, unspecified: Secondary | ICD-10-CM | POA: Diagnosis not present

## 2022-10-19 DIAGNOSIS — I10 Essential (primary) hypertension: Secondary | ICD-10-CM | POA: Insufficient documentation

## 2022-10-19 DIAGNOSIS — K219 Gastro-esophageal reflux disease without esophagitis: Secondary | ICD-10-CM | POA: Insufficient documentation

## 2022-10-19 DIAGNOSIS — R220 Localized swelling, mass and lump, head: Secondary | ICD-10-CM | POA: Diagnosis not present

## 2022-10-19 DIAGNOSIS — Z79899 Other long term (current) drug therapy: Secondary | ICD-10-CM | POA: Diagnosis not present

## 2022-10-19 DIAGNOSIS — G709 Myoneural disorder, unspecified: Secondary | ICD-10-CM | POA: Diagnosis not present

## 2022-10-19 DIAGNOSIS — K3184 Gastroparesis: Secondary | ICD-10-CM | POA: Insufficient documentation

## 2022-10-19 DIAGNOSIS — D164 Benign neoplasm of bones of skull and face: Secondary | ICD-10-CM | POA: Diagnosis not present

## 2022-10-19 DIAGNOSIS — Z7982 Long term (current) use of aspirin: Secondary | ICD-10-CM | POA: Insufficient documentation

## 2022-10-19 DIAGNOSIS — Z86718 Personal history of other venous thrombosis and embolism: Secondary | ICD-10-CM | POA: Insufficient documentation

## 2022-10-19 DIAGNOSIS — E1143 Type 2 diabetes mellitus with diabetic autonomic (poly)neuropathy: Secondary | ICD-10-CM | POA: Insufficient documentation

## 2022-10-19 DIAGNOSIS — E1149 Type 2 diabetes mellitus with other diabetic neurological complication: Secondary | ICD-10-CM | POA: Diagnosis not present

## 2022-10-19 DIAGNOSIS — Z7984 Long term (current) use of oral hypoglycemic drugs: Secondary | ICD-10-CM | POA: Diagnosis not present

## 2022-10-19 DIAGNOSIS — E1165 Type 2 diabetes mellitus with hyperglycemia: Secondary | ICD-10-CM

## 2022-10-19 HISTORY — PX: CRANIOTOMY: SHX93

## 2022-10-19 LAB — GLUCOSE, CAPILLARY
Glucose-Capillary: 111 mg/dL — ABNORMAL HIGH (ref 70–99)
Glucose-Capillary: 91 mg/dL (ref 70–99)

## 2022-10-19 LAB — ABO/RH: ABO/RH(D): O POS

## 2022-10-19 SURGERY — CRANIOTOMY BONE FLAP/PROSTHETIC PLATE
Anesthesia: General | Laterality: Right

## 2022-10-19 MED ORDER — DEXAMETHASONE SODIUM PHOSPHATE 10 MG/ML IJ SOLN
INTRAMUSCULAR | Status: AC
  Filled 2022-10-19: qty 1

## 2022-10-19 MED ORDER — SUCCINYLCHOLINE CHLORIDE 200 MG/10ML IV SOSY
PREFILLED_SYRINGE | INTRAVENOUS | Status: AC
  Filled 2022-10-19: qty 10

## 2022-10-19 MED ORDER — FENTANYL CITRATE (PF) 100 MCG/2ML IJ SOLN
25.0000 ug | INTRAMUSCULAR | Status: DC | PRN
  Administered 2022-10-19 (×2): 25 ug via INTRAVENOUS

## 2022-10-19 MED ORDER — LIDOCAINE 2% (20 MG/ML) 5 ML SYRINGE
INTRAMUSCULAR | Status: DC | PRN
  Administered 2022-10-19: 60 mg via INTRAVENOUS

## 2022-10-19 MED ORDER — ORAL CARE MOUTH RINSE: 15.0000 mL | Freq: Once | OROMUCOSAL | Status: AC

## 2022-10-19 MED ORDER — FENTANYL CITRATE (PF) 250 MCG/5ML IJ SOLN
INTRAMUSCULAR | Status: DC | PRN
  Administered 2022-10-19: 50 ug via INTRAVENOUS

## 2022-10-19 MED ORDER — AMISULPRIDE (ANTIEMETIC) 5 MG/2ML IV SOLN: 10.0000 mg | Freq: Once | INTRAVENOUS | Status: DC | PRN

## 2022-10-19 MED ORDER — SODIUM CHLORIDE 0.9 % IV SOLN: INTRAVENOUS | Status: DC

## 2022-10-19 MED ORDER — EPHEDRINE SULFATE-NACL 50-0.9 MG/10ML-% IV SOSY
PREFILLED_SYRINGE | INTRAVENOUS | Status: DC | PRN
  Administered 2022-10-19 (×2): 5 mg via INTRAVENOUS

## 2022-10-19 MED ORDER — 0.9 % SODIUM CHLORIDE (POUR BTL) OPTIME
TOPICAL | Status: DC | PRN
  Administered 2022-10-19: 1000 mL

## 2022-10-19 MED ORDER — CHLORHEXIDINE GLUCONATE CLOTH 2 % EX PADS: 6.0000 | MEDICATED_PAD | Freq: Once | CUTANEOUS | Status: DC

## 2022-10-19 MED ORDER — CEFAZOLIN SODIUM-DEXTROSE 2-4 GM/100ML-% IV SOLN
2.0000 g | INTRAVENOUS | Status: AC
  Administered 2022-10-19: 2 g via INTRAVENOUS

## 2022-10-19 MED ORDER — LIDOCAINE-EPINEPHRINE 0.5 %-1:200000 IJ SOLN
INTRAMUSCULAR | Status: AC
  Filled 2022-10-19: qty 50

## 2022-10-19 MED ORDER — LIDOCAINE-EPINEPHRINE 0.5 %-1:200000 IJ SOLN
INTRAMUSCULAR | Status: DC | PRN
  Administered 2022-10-19: 3 mL

## 2022-10-19 MED ORDER — CHLORHEXIDINE GLUCONATE 0.12 % MT SOLN
15.0000 mL | Freq: Once | OROMUCOSAL | Status: AC
  Administered 2022-10-19: 15 mL via OROMUCOSAL

## 2022-10-19 MED ORDER — FENTANYL CITRATE (PF) 250 MCG/5ML IJ SOLN
INTRAMUSCULAR | Status: AC
  Filled 2022-10-19: qty 5

## 2022-10-19 MED ORDER — THROMBIN 20000 UNITS EX SOLR
CUTANEOUS | Status: AC
  Filled 2022-10-19: qty 20000

## 2022-10-19 MED ORDER — PHENYLEPHRINE 80 MCG/ML (10ML) SYRINGE FOR IV PUSH (FOR BLOOD PRESSURE SUPPORT)
PREFILLED_SYRINGE | INTRAVENOUS | Status: DC | PRN
  Administered 2022-10-19: 160 ug via INTRAVENOUS

## 2022-10-19 MED ORDER — ONDANSETRON HCL 4 MG/2ML IJ SOLN
INTRAMUSCULAR | Status: AC
  Filled 2022-10-19: qty 2

## 2022-10-19 MED ORDER — MIDAZOLAM HCL 2 MG/2ML IJ SOLN
INTRAMUSCULAR | Status: AC
  Filled 2022-10-19: qty 2

## 2022-10-19 MED ORDER — LIDOCAINE 2% (20 MG/ML) 5 ML SYRINGE
INTRAMUSCULAR | Status: AC
  Filled 2022-10-19: qty 5

## 2022-10-19 MED ORDER — PROPOFOL 10 MG/ML IV BOLUS
INTRAVENOUS | Status: DC | PRN
  Administered 2022-10-19: 20 mg via INTRAVENOUS
  Administered 2022-10-19: 130 mg via INTRAVENOUS

## 2022-10-19 MED ORDER — DEXAMETHASONE SODIUM PHOSPHATE 10 MG/ML IJ SOLN
INTRAMUSCULAR | Status: DC | PRN
  Administered 2022-10-19: 5 mg via INTRAVENOUS

## 2022-10-19 MED ORDER — BACITRACIN ZINC 500 UNIT/GM EX OINT
TOPICAL_OINTMENT | CUTANEOUS | Status: AC
  Filled 2022-10-19: qty 28.35

## 2022-10-19 MED ORDER — BACITRACIN ZINC 500 UNIT/GM EX OINT
TOPICAL_OINTMENT | CUTANEOUS | Status: DC | PRN
  Administered 2022-10-19: 1 via TOPICAL

## 2022-10-19 MED ORDER — ACETAMINOPHEN 500 MG PO TABS
1000.0000 mg | ORAL_TABLET | Freq: Once | ORAL | Status: AC
  Administered 2022-10-19: 1000 mg via ORAL
  Filled 2022-10-19: qty 2

## 2022-10-19 MED ORDER — INSULIN ASPART 100 UNIT/ML IJ SOLN: 0.0000 [IU] | INTRAMUSCULAR | Status: DC | PRN

## 2022-10-19 MED ORDER — EPHEDRINE 5 MG/ML INJ
INTRAVENOUS | Status: AC
  Filled 2022-10-19: qty 5

## 2022-10-19 MED ORDER — THROMBIN 20000 UNITS EX SOLR
CUTANEOUS | Status: DC | PRN
  Administered 2022-10-19: 20 mL via TOPICAL

## 2022-10-19 MED ORDER — MIDAZOLAM HCL 2 MG/2ML IJ SOLN
INTRAMUSCULAR | Status: DC | PRN
  Administered 2022-10-19: 2 mg via INTRAVENOUS

## 2022-10-19 MED ORDER — SUCCINYLCHOLINE 20MG/ML (10ML) SYRINGE FOR MEDFUSION PUMP - OPTIME
INTRAMUSCULAR | Status: DC | PRN
  Administered 2022-10-19: 100 mg via INTRAVENOUS

## 2022-10-19 MED ORDER — FENTANYL CITRATE (PF) 100 MCG/2ML IJ SOLN
INTRAMUSCULAR | Status: AC
  Filled 2022-10-19: qty 2

## 2022-10-19 MED ORDER — PROPOFOL 10 MG/ML IV BOLUS
INTRAVENOUS | Status: AC
  Filled 2022-10-19: qty 20

## 2022-10-19 MED ORDER — AMISULPRIDE (ANTIEMETIC) 5 MG/2ML IV SOLN
INTRAVENOUS | Status: AC
  Filled 2022-10-19: qty 4

## 2022-10-19 MED ORDER — LACTATED RINGERS IV SOLN: INTRAVENOUS | Status: DC

## 2022-10-19 MED ORDER — HYDROCODONE-ACETAMINOPHEN 5-325 MG PO TABS: 1.0000 | ORAL_TABLET | Freq: Four times a day (QID) | ORAL | 0 refills | Status: AC | PRN

## 2022-10-19 MED ORDER — HYDROCODONE-ACETAMINOPHEN 5-325 MG PO TABS
1.0000 | ORAL_TABLET | Freq: Four times a day (QID) | ORAL | 0 refills | Status: AC | PRN
  Filled 2022-10-19: qty 30, 8d supply, fill #0

## 2022-10-19 SURGICAL SUPPLY — 55 items
BAG COUNTER SPONGE SURGICOUNT (BAG) ×1 IMPLANT
BAG SPNG CNTER NS LX DISP (BAG) ×1
BIT DRILL WIRE PASS 1.3MM (BIT) IMPLANT
BNDG CMPR 75X41 PLY HI ABS (GAUZE/BANDAGES/DRESSINGS)
BNDG GAUZE DERMACEA FLUFF 4 (GAUZE/BANDAGES/DRESSINGS) ×2 IMPLANT
BNDG GZE DERMACEA 4 6PLY (GAUZE/BANDAGES/DRESSINGS)
BNDG STRETCH 4X75 STRL LF (GAUZE/BANDAGES/DRESSINGS) IMPLANT
BUR ACORN 6.0 PRECISION (BURR) IMPLANT
BUR SPIRAL ROUTER 2.3 (BUR) IMPLANT
CANISTER SUCT 3000ML PPV (MISCELLANEOUS) ×1 IMPLANT
DRAIN PENROSE .5X12 LATEX STL (DRAIN) IMPLANT
DRAPE NEUROLOGICAL W/INCISE (DRAPES) ×1 IMPLANT
DRAPE SURG 17X23 STRL (DRAPES) IMPLANT
DRAPE WARM FLUID 44X44 (DRAPES) ×1 IMPLANT
DRILL WIRE PASS 1.3MM (BIT)
DRSG TEGADERM 2-3/8X2-3/4 SM (GAUZE/BANDAGES/DRESSINGS) IMPLANT
DURAPREP 6ML APPLICATOR 50/CS (WOUND CARE) ×1 IMPLANT
ELECT REM PT RETURN 9FT ADLT (ELECTROSURGICAL) ×1
ELECTRODE REM PT RTRN 9FT ADLT (ELECTROSURGICAL) ×1 IMPLANT
EVACUATOR SILICONE 100CC (DRAIN) IMPLANT
GAUZE 4X4 16PLY ~~LOC~~+RFID DBL (SPONGE) IMPLANT
GAUZE PAD ABD 8X10 STRL (GAUZE/BANDAGES/DRESSINGS) IMPLANT
GAUZE SPONGE 4X4 12PLY STRL (GAUZE/BANDAGES/DRESSINGS) IMPLANT
GLOVE ECLIPSE 6.5 STRL STRAW (GLOVE) ×1 IMPLANT
GLOVE EXAM NITRILE XL STR (GLOVE) IMPLANT
GOWN STRL REUS W/ TWL LRG LVL3 (GOWN DISPOSABLE) IMPLANT
GOWN STRL REUS W/ TWL XL LVL3 (GOWN DISPOSABLE) IMPLANT
GOWN STRL REUS W/TWL 2XL LVL3 (GOWN DISPOSABLE) IMPLANT
GOWN STRL REUS W/TWL LRG LVL3 (GOWN DISPOSABLE) ×1
GOWN STRL REUS W/TWL XL LVL3 (GOWN DISPOSABLE) ×1
HOOK DURA (MISCELLANEOUS) IMPLANT
KIT BASIN OR (CUSTOM PROCEDURE TRAY) ×1 IMPLANT
KIT TURNOVER KIT B (KITS) ×1 IMPLANT
NEEDLE HYPO 22GX1.5 SAFETY (NEEDLE) ×1 IMPLANT
NS IRRIG 1000ML POUR BTL (IV SOLUTION) ×1 IMPLANT
PACK CRANIOTOMY CUSTOM (CUSTOM PROCEDURE TRAY) ×1 IMPLANT
PAD ARMBOARD 7.5X6 YLW CONV (MISCELLANEOUS) ×1 IMPLANT
PATTIES SURGICAL .5 X.5 (GAUZE/BANDAGES/DRESSINGS) IMPLANT
PATTIES SURGICAL .5 X3 (DISPOSABLE) IMPLANT
PATTIES SURGICAL 1X1 (DISPOSABLE) IMPLANT
PIN MAYFIELD SKULL DISP (PIN) IMPLANT
SOL ELECTROSURG ANTI STICK (MISCELLANEOUS) ×1
SOLUTION ELECTROSURG ANTI STCK (MISCELLANEOUS) ×1 IMPLANT
SPONGE NEURO XRAY DETECT 1X3 (DISPOSABLE) IMPLANT
SPONGE SURGIFOAM ABS GEL 100 (HEMOSTASIS) ×1 IMPLANT
STAPLER SKIN PROX WIDE 3.9 (STAPLE) ×1 IMPLANT
SUT ETHILON 3 0 FSL (SUTURE) IMPLANT
SUT NURALON 4 0 TR CR/8 (SUTURE) ×2 IMPLANT
SUT VIC AB 2-0 CT2 18 VCP726D (SUTURE) ×2 IMPLANT
TOWEL GREEN STERILE (TOWEL DISPOSABLE) ×2 IMPLANT
TOWEL GREEN STERILE FF (TOWEL DISPOSABLE) ×1 IMPLANT
TRAY FOLEY MTR SLVR 16FR STAT (SET/KITS/TRAYS/PACK) IMPLANT
TUBING FEATHERFLOW (TUBING) IMPLANT
UNDERPAD 30X36 HEAVY ABSORB (UNDERPADS AND DIAPERS) IMPLANT
WATER STERILE IRR 1000ML POUR (IV SOLUTION) ×1 IMPLANT

## 2022-10-19 NOTE — Discharge Instructions (Addendum)
You may shower on Sunday and remove the dressing. You may shower just keep your head dry until Sunday. Call if the wound is very tender, is red, or is draining. [phone]

## 2022-10-19 NOTE — Anesthesia Postprocedure Evaluation (Signed)
Anesthesia Post Note  Patient: TAREVA LEVEN  Procedure(s) Performed: RIGHT POST auricular osteoma resection (Right)     Patient location during evaluation: PACU Anesthesia Type: General Level of consciousness: awake and alert Pain management: pain level controlled Vital Signs Assessment: post-procedure vital signs reviewed and stable Respiratory status: spontaneous breathing, nonlabored ventilation, respiratory function stable and patient connected to nasal cannula oxygen Cardiovascular status: blood pressure returned to baseline and stable Postop Assessment: no apparent nausea or vomiting Anesthetic complications: no  No notable events documented.  Last Vitals:  Vitals:   10/19/22 1105 10/19/22 1120  BP: 138/83 116/78  Pulse: 66 69  Resp: 17 10  Temp:  36.4 C  SpO2: 95% 94%    Last Pain:  Vitals:   10/19/22 1120  TempSrc:   PainSc: 3                  Kennieth Rad

## 2022-10-19 NOTE — H&P (Signed)
Monique Wiggins is an 57 y.o. female.   Chief Complaint: bony mass posterior to right ear HPI: the mass has grown and Mrs. Bechard is not satisfied with the cosmetic effect. She wound like it removed.   Past Medical History:  Diagnosis Date   Allergy    Arthritis    Back pain    Carpal tunnel syndrome    Diabetes mellitus without complication (HCC)    DVT (deep venous thrombosis) (HCC)    after long car trip   Dyspnea    Gastroparesis    GERD (gastroesophageal reflux disease)    H. pylori infection    1997   Heart murmur    ECHO 03/29/21   Hypertension    Hypertriglyceridemia 02/22/2017   Hypertriglyceridemia 02/22/2017   Migraines    Sickle cell trait (HCC)    Sleep apnea    states she no longer has OSA lost weight    Past Surgical History:  Procedure Laterality Date   ABDOMINAL HYSTERECTOMY     ovaries left in place   CARPAL TUNNEL RELEASE Right 01/19/2019   Procedure: ENDOSCOPIC CARPAL TUNNEL RELEASE;  Surgeon: Mack Hook, MD;  Location: [facility];  Service: Orthopedics;  Laterality: Right;   CARPOMETACARPEL SUSPENSION PLASTY Right 01/19/2019   Procedure: RIGHT THUMB SUSPENSION ARTHROPLASTY;  Surgeon: Mack Hook, MD;  Location: [facility];  Service: Orthopedics;  Laterality: Right;  PREOP BLOCK   CATARACT EXTRACTION Bilateral    CHOLECYSTECTOMY  01/24/2021   TOE SURGERY     left great toe, repair of severed tendon   TONSILLECTOMY     tummy tuck      Family History  Problem Relation Age of Onset   Other Mother        nasal polyp   Diabetes type II Mother    High blood pressure Mother    Diabetes Mellitus II Father    Pulmonary fibrosis Father    Hypertension Father    Pulmonary disease Father    High Cholesterol Father    Hypertension Sister    Diabetes Sister    Allergic Disorder Son    Hyperthyroidism Son    Allergic Disorder Son    Asthma Son    Stomach cancer Maternal Grandmother        possibility that this  was colon cancer instead-patient and her mother have differing recollection   Cancer Maternal Grandfather        lung, smoker   Esophageal cancer Neg Hx    Pancreatic cancer Neg Hx    Colon cancer Neg Hx    Social History:  reports that she has never smoked. She has never used smokeless tobacco. She reports that she does not currently use alcohol. She reports that she does not use drugs.  Allergies:  Allergies  Allergen Reactions   Pollen Extract Swelling   Shellfish Allergy Swelling    Medications Prior to Admission  Medication Sig Dispense Refill   albuterol (VENTOLIN HFA) 108 (90 Base) MCG/ACT inhaler Inhale 2 puffs into the lungs every 6 (six) hours as needed for wheezing/shortness of breath. 6.7 g 5   amLODipine (NORVASC) 5 MG tablet Take 1 tablet (5 mg total) by mouth daily. 90 tablet 0   Ascorbic Acid (VITAMIN C PO) Take 1 capsule by mouth daily.     aspirin EC 81 MG tablet Take 81 mg by mouth daily. Swallow whole.     atorvastatin (LIPITOR) 20 MG tablet Take 1 tablet (20 mg total)  by mouth daily. 90 tablet 1   Budeson-Glycopyrrol-Formoterol (BREZTRI AEROSPHERE) 160-9-4.8 MCG/ACT AERO Inhale 2 puffs into the lungs in the morning and at bedtime. (Patient taking differently: Inhale 2 puffs into the lungs 2 (two) times daily as needed (shortness of breath).) 10.7 g 0   carvedilol (COREG) 25 MG tablet Take 1 tablet (25 mg total) by mouth 2 (two) times daily with a meal. 180 tablet 0   cetirizine (ZYRTEC) 10 MG tablet Take 1 tablet (10 mg total) by mouth daily. 90 tablet 3   cholecalciferol (VITAMIN D3) 25 MCG (1000 UNIT) tablet Take 1,000 Units by mouth daily.     Cyanocobalamin (B-12 SL) Place 1 tablet under the tongue daily.     dapagliflozin propanediol (FARXIGA) 10 MG TABS tablet Take 1 tablet (10 mg total) by mouth daily. 90 tablet 1   dicyclomine (BENTYL) 20 MG tablet Take 1 tablet (20 mg total) by mouth 3 (three) times daily as needed. 20 tablet 0   Digestive Enzymes (PAPAYA  AND ENZYMES PO) Take 1 tablet by mouth daily.     DULoxetine (CYMBALTA) 30 MG capsule Take 2 capsules (60 mg total) by mouth daily. 180 capsule 1   fluticasone (FLONASE) 50 MCG/ACT nasal spray Place 2 sprays into both nostrils daily as needed for allergies. 48 g 1   fluticasone-salmeterol (ADVAIR DISKUS) 500-50 MCG/ACT AEPB Inhale 1 puff into the lungs in the morning and at bedtime. (Patient taking differently: Inhale 1 puff into the lungs daily.) 60 each 3   gabapentin (NEURONTIN) 300 MG capsule Take 1 capsule (300 mg total) by mouth at bedtime. 90 capsule 1   glipiZIDE (GLUCOTROL) 5 MG tablet Take 1/2 tablet (2.5 mg total) by mouth daily before breakfast. 45 tablet 3   hydrochlorothiazide (HYDRODIURIL) 25 MG tablet Take 1 tablet (25 mg total) by mouth daily. 90 tablet 1   hyoscyamine (ANASPAZ) 0.125 MG TBDP disintergrating tablet Place 1 tablet (0.125 mg total) under the tongue every 6 (six) hours as needed for cramping. 60 tablet 2   losartan (COZAAR) 100 MG tablet Take 1 tablet (100 mg total) by mouth daily. Take with HCTZ 90 tablet 1   MAGNESIUM PO Take 1 tablet by mouth daily.     mometasone (ELOCON) 0.1 % cream Apply to affected area daily. (Patient taking differently: Apply 1 Application topically daily as needed (irritation).) 135 g 1   Multiple Vitamin (MULTIVITAMIN WITH MINERALS) TABS tablet Take 1 tablet by mouth daily.     Multiple Vitamins-Minerals (HAIR SKIN & NAILS) TABS Take 1 tablet by mouth daily.     Multiple Vitamins-Minerals (ZINC PO) Take 1 tablet by mouth daily.     Omega-3 Fatty Acids (FISH OIL PO) Take 1 capsule by mouth daily.     omeprazole (PRILOSEC) 40 MG capsule Take 1 capsule (40 mg total) by mouth daily. 90 capsule 1   Polyvinyl Alcohol-Povidone (CLEAR EYES NATURAL TEARS OP) Place 1 drop into both eyes daily as needed (dry eyes).     potassium chloride SA (KLOR-CON M) 20 MEQ tablet TAKE TWO TABLETS BY MOUTH ON TUESDAY AND SATURDAY AND ONE TABLET ALL OTHER DAYS. 114  tablet 1   psyllium (REGULOID) 0.52 g capsule Take 0.52 g by mouth daily.     sertraline (ZOLOFT) 50 MG tablet Take 1 tablet (50 mg total) by mouth at bedtime. 90 tablet 0   tirzepatide (MOUNJARO) 10 MG/0.5ML Pen Inject 10 mg into the skin once a week. 6 mL 1   topiramate (TOPAMAX)  50 MG tablet Take 1 tablet (50 mg total) by mouth daily. 90 tablet 0   tretinoin (RETIN-A) 0.025 % cream Apply to affected area at bedtime. 135 g 1   aluminum-magnesium hydroxide-simethicone (MAALOX) 200-200-20 MG/5ML SUSP Take 30 mLs by mouth 3 (three) times daily with meals as needed. (Patient not taking: Reported on 10/10/2022) 355 mL 0   benzonatate (TESSALON) 100 MG capsule Take 1 capsule (100 mg total) by mouth 3 (three) times daily as needed for cough. (Patient not taking: Reported on 10/10/2022) 30 capsule 0   Blood Glucose Monitoring Suppl (FREESTYLE LITE) w/Device KIT Use as directed to test blood sugar once a day 1 kit 0   chlorhexidine (PERIDEX) 0.12 % solution 15 mLs 2 (two) times daily. (Patient not taking: Reported on 10/10/2022)     Continuous Blood Gluc Receiver (DEXCOM G6 RECEIVER) DEVI Use as directed to check blood sugars 1 each 0   Continuous Glucose Sensor (DEXCOM G6 SENSOR) MISC Check blood sugars as directed 3 each 3   Continuous Glucose Transmitter (DEXCOM G6 TRANSMITTER) MISC Check blood sugars as directed 1 each 0   fluticasone (FLONASE) 50 MCG/ACT nasal spray Place 2 sprays into both nostrils daily. (Patient not taking: Reported on 10/10/2022) 16 g 1   glucose blood test strip Use as directed to test blood sugar daily in the afternoon 100 each 3   Lancets (FREESTYLE) lancets Use as directed to test once a day 100 each 3   linaclotide (LINZESS) 145 MCG CAPS capsule Take 1 capsule (145 mcg total) by mouth daily before breakfast. (Patient not taking: Reported on 10/10/2022) 30 capsule 2   loperamide (IMODIUM) 2 MG capsule Take 1 capsule (2 mg total) by mouth 4 (four) times daily as needed for diarrhea  or loose stools. 12 capsule 0   metFORMIN (GLUCOPHAGE) 500 MG tablet Take 1 tablet (500 mg total) by mouth daily with breakfast. (Patient not taking: Reported on 10/10/2022) 90 tablet 3   methylPREDNISolone (MEDROL) 4 MG TBPK tablet Take as directed on 6 day dose pack. (Patient not taking: Reported on 10/10/2022) 21 tablet 0   ondansetron (ZOFRAN-ODT) 4 MG disintegrating tablet Dissolve 1 tablet (4 mg total) by mouth every 8 (eight) hours as needed. 20 tablet 0    Results for orders placed or performed during the hospital encounter of 10/19/22 (from the past 48 hour(s))  Glucose, capillary     Status: Abnormal   Collection Time: 10/19/22  7:21 AM  Result Value Ref Range   Glucose-Capillary 111 (H) 70 - 99 mg/dL    Comment: Glucose reference range applies only to samples taken after fasting for at least 8 hours.   Comment 1 Notify RN    Comment 2 Document in Chart   ABO/Rh     Status: None   Collection Time: 10/19/22  7:35 AM  Result Value Ref Range   ABO/RH(D)      O POS Performed at [facility], [address] [place], [place] [address]    No results found.  Review of Systems  Constitutional: Negative.   Eyes: Negative.   Respiratory: Negative.    Cardiovascular: Negative.   Gastrointestinal: Negative.   Endocrine: Negative.   Genitourinary: Negative.   Musculoskeletal: Negative.   Allergic/Immunologic: Negative.   Neurological: Negative.   Hematological: Negative.   Psychiatric/Behavioral: Negative.      Blood pressure 117/78, pulse 64, temperature 97.8 F (36.6 C), temperature source Oral, resp. rate 18, height 5\' 1"  (1.549 m), weight  72.6 kg, SpO2 94 %. Physical Exam Constitutional:      Appearance: Normal appearance. She is normal weight.  HENT:     Head: Normocephalic.     Right Ear: External ear normal.     Left Ear: External ear normal.     Nose: Nose normal.     Mouth/Throat:     Mouth: Mucous membranes are moist.     Pharynx: Oropharynx is clear.   Eyes:     Extraocular Movements: Extraocular movements intact.     Pupils: Pupils are equal, round, and reactive to light.  Cardiovascular:     Rate and Rhythm: Normal rate and regular rhythm.  Pulmonary:     Effort: Pulmonary effort is normal.  Abdominal:     General: Abdomen is flat. Bowel sounds are normal.  Musculoskeletal:        General: Normal range of motion.     Cervical back: Normal range of motion.  Neurological:     General: No focal deficit present.     Mental Status: She is alert and oriented to person, place, and time.  Psychiatric:        Mood and Affect: Mood normal.        Behavior: Behavior normal.        Thought Content: Thought content normal.        Judgment: Judgment normal.      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  Coletta Memos, MD 10/19/2022, 8:52 AM

## 2022-10-19 NOTE — Anesthesia Procedure Notes (Signed)
Procedure Name: Intubation Date/Time: 10/19/2022 9:11 AM  Performed by: Aundria Rud, CRNAPre-anesthesia Checklist: Patient identified, Emergency Drugs available, Suction available and Patient being monitored Patient Re-evaluated:Patient Re-evaluated prior to induction Oxygen Delivery Method: Circle System Utilized Preoxygenation: Pre-oxygenation with 100% oxygen Induction Type: IV induction Ventilation: Mask ventilation without difficulty Laryngoscope Size: Mac and 3 Grade View: Grade II Tube type: Oral Tube size: 7.0 mm Number of attempts: 1 Airway Equipment and Method: Stylet and Oral airway Placement Confirmation: ETT inserted through vocal cords under direct vision, positive ETCO2 and breath sounds checked- equal and bilateral Secured at: 22 cm Tube secured with: Tape Dental Injury: Teeth and Oropharynx as per pre-operative assessment

## 2022-10-19 NOTE — Transfer of Care (Signed)
Immediate Anesthesia Transfer of Care Note  Patient: Monique Wiggins  Procedure(s) Performed: RIGHT POST auricular osteoma resection (Right)  Patient Location: PACU  Anesthesia Type:General  Level of Consciousness: awake, alert , and drowsy  Airway & Oxygen Therapy: Patient Spontanous Breathing and Patient connected to face mask oxygen  Post-op Assessment: Report given to RN, Post -op Vital signs reviewed and stable, and Patient moving all extremities X 4  Post vital signs: Reviewed and stable  Last Vitals:  Vitals Value Taken Time  BP 128/80 10/19/22 1016  Temp    Pulse 70 10/19/22 1017  Resp 13 10/19/22 1017  SpO2 90 % 10/19/22 1017  Vitals shown include unvalidated device data.  Last Pain:  Vitals:   10/19/22 0724  TempSrc:   PainSc: 0-No pain         Complications: No notable events documented.

## 2022-10-19 NOTE — Op Note (Signed)
10/19/2022  10:55 AM  PATIENT:  Monique Wiggins  57 y.o. female  PRE-OPERATIVE DIAGNOSIS:  Osteoma of skull  POST-OPERATIVE DIAGNOSIS:  Osteoma of skull  PROCEDURE:  Procedure(s): RIGHT mastoid osteoma resection  SURGEON: Surgeon(s): Coletta Memos, MD  ASSISTANTS:none  ANESTHESIA:   general  EBL:  Total I/O In: 800 [I.V.:800] Out: 10 [Blood:10]  BLOOD ADMINISTERED:none  CELL SAVER GIVEN:not used  COUNT:correct  DRAINS: none   SPECIMEN:  Source of Specimen:  skull  DICTATION: Monique Wiggins was taken to the operating room, intubated, and placed under a general anesthetic without difficulty. She was positioned supine with her head on a horse shoe headrest. Her head was prepped and draped in a sterile manner. I infiltrated the planned incision with lidocaine. I made a linear incision over the osteoma. I retracted the scalp over the mass.  I used the drill, Kerrison punches, and the rongeur to remove the bone. The bone was very hard. I did not encounter any air cells I irrigated then closed the wound in layers.   PLAN OF CARE: Discharge to home after PACU  PATIENT DISPOSITION:  PACU - hemodynamically stable.   Delay start of Pharmacological VTE agent (>24hrs) due to surgical blood loss or risk of bleeding:  no

## 2022-10-20 ENCOUNTER — Encounter (HOSPITAL_COMMUNITY): Payer: Self-pay | Admitting: Neurosurgery

## 2022-10-21 ENCOUNTER — Encounter: Payer: Self-pay | Admitting: Surgery

## 2022-10-22 LAB — SURGICAL PATHOLOGY

## 2022-10-23 ENCOUNTER — Other Ambulatory Visit (HOSPITAL_COMMUNITY): Payer: Self-pay

## 2022-10-24 ENCOUNTER — Ambulatory Visit: Payer: Commercial Managed Care - PPO | Admitting: Pulmonary Disease

## 2022-10-24 ENCOUNTER — Other Ambulatory Visit (HOSPITAL_COMMUNITY): Payer: Self-pay

## 2022-10-24 ENCOUNTER — Other Ambulatory Visit: Payer: Self-pay

## 2022-11-08 ENCOUNTER — Encounter: Payer: Self-pay | Admitting: Pulmonary Disease

## 2022-11-13 ENCOUNTER — Other Ambulatory Visit (HOSPITAL_COMMUNITY): Payer: Self-pay

## 2022-11-13 ENCOUNTER — Other Ambulatory Visit: Payer: Self-pay | Admitting: Family Medicine

## 2022-11-13 MED ORDER — AMLODIPINE BESYLATE 5 MG PO TABS
5.0000 mg | ORAL_TABLET | Freq: Every day | ORAL | 1 refills | Status: DC
  Filled 2022-11-13: qty 90, 90d supply, fill #0
  Filled 2023-02-13: qty 90, 90d supply, fill #1

## 2022-11-13 MED ORDER — TOPIRAMATE 50 MG PO TABS
50.0000 mg | ORAL_TABLET | Freq: Every day | ORAL | 1 refills | Status: DC
  Filled 2022-11-13: qty 90, 90d supply, fill #0
  Filled 2023-02-13: qty 90, 90d supply, fill #1

## 2022-11-13 MED ORDER — CARVEDILOL 25 MG PO TABS
25.0000 mg | ORAL_TABLET | Freq: Two times a day (BID) | ORAL | 1 refills | Status: DC
  Filled 2022-11-13: qty 180, 90d supply, fill #0
  Filled 2023-02-13: qty 180, 90d supply, fill #1

## 2022-11-13 MED ORDER — SERTRALINE HCL 50 MG PO TABS
50.0000 mg | ORAL_TABLET | Freq: Every day | ORAL | 1 refills | Status: DC
  Filled 2022-11-13: qty 90, 90d supply, fill #0
  Filled 2023-02-13: qty 90, 90d supply, fill #1

## 2022-11-14 ENCOUNTER — Other Ambulatory Visit (HOSPITAL_COMMUNITY): Payer: Self-pay

## 2022-11-15 ENCOUNTER — Encounter: Payer: Self-pay | Admitting: Family Medicine

## 2022-11-15 ENCOUNTER — Other Ambulatory Visit: Payer: Self-pay | Admitting: Family Medicine

## 2022-11-15 ENCOUNTER — Other Ambulatory Visit (HOSPITAL_COMMUNITY): Payer: Self-pay

## 2022-11-15 MED ORDER — TIRZEPATIDE 12.5 MG/0.5ML ~~LOC~~ SOAJ
12.5000 mg | SUBCUTANEOUS | 1 refills | Status: DC
  Filled 2022-11-15: qty 2, 28d supply, fill #0
  Filled 2022-12-10: qty 2, 28d supply, fill #1
  Filled 2022-12-21: qty 2, 28d supply, fill #2

## 2022-11-16 ENCOUNTER — Other Ambulatory Visit (HOSPITAL_COMMUNITY): Payer: Self-pay

## 2022-11-23 ENCOUNTER — Other Ambulatory Visit (HOSPITAL_COMMUNITY): Payer: Self-pay

## 2022-11-23 ENCOUNTER — Other Ambulatory Visit: Payer: Self-pay | Admitting: Family Medicine

## 2022-11-24 ENCOUNTER — Other Ambulatory Visit (HOSPITAL_COMMUNITY): Payer: Self-pay

## 2022-11-26 ENCOUNTER — Other Ambulatory Visit (HOSPITAL_COMMUNITY): Payer: Self-pay

## 2022-11-26 MED ORDER — GABAPENTIN 300 MG PO CAPS
300.0000 mg | ORAL_CAPSULE | Freq: Every day | ORAL | 1 refills | Status: DC
  Filled 2022-11-26 – 2022-12-01 (×2): qty 90, 90d supply, fill #0
  Filled 2023-03-15: qty 90, 90d supply, fill #1

## 2022-11-27 ENCOUNTER — Other Ambulatory Visit (HOSPITAL_COMMUNITY): Payer: Self-pay

## 2022-11-29 ENCOUNTER — Encounter: Payer: Commercial Managed Care - PPO | Attending: Internal Medicine | Admitting: Dietician

## 2022-11-29 ENCOUNTER — Encounter: Payer: Self-pay | Admitting: Dietician

## 2022-11-29 VITALS — Wt 158.0 lb

## 2022-11-29 DIAGNOSIS — E1165 Type 2 diabetes mellitus with hyperglycemia: Secondary | ICD-10-CM | POA: Insufficient documentation

## 2022-11-29 NOTE — Patient Instructions (Signed)
Great job on changes made! Continue to stay active Continue your eating plan Continue to drink mostly water  Find ways to increase your vegetable intake.

## 2022-11-29 NOTE — Progress Notes (Signed)
Diabetes Self-Management Education  Visit Type:    Appt. Start Time: 1615 Appt. End Time: 1645  11/29/2022  Ms. Chrystine Oiler, identified by name and date of birth, is a 57 y.o. female with a diagnosis of Diabetes:  .   ASSESSMENT Patient is here today alone.  She was last seen by this RD on 05/24/2022. 2nd of 3 employee visits. She has lost 13 lbs in the past 3 months.   A1C has decreased. She states that she feels great! She is getting ready to start the Zumba class next week. She has just started walking. She just had returned to work after having an osteoma removed.  Now following the Mediterranean diet.  Her husband also has been losing weight.  He feels better not having fast food as often. She went from animal based protein to plant based protein.  History includes Type 2 diabetes, nausea after gallbladder removed 02/2021, HTN, HLD, OSA (dx years ago and no longer uses c-pap) Poor sleep (3 hours per night), wakes frequently to use the restroom Back pain A1C  6.1% 10/09/2022 decreased from 7.5% 07/03/2022, 7.3% 08/31/2021, cholesterol 106, HDL 45, Triglycerides 234 11/452, GFR 62 08/31/2021  Medications include:  Farxiga, Mounjaro, glipizide, Vitamin B-12, vitamin D3, omega 3, zinc, vitamin C, magnesium, papaya enzymes, hair skin and nails, psyllium husk caps, potassium Dexcom G6 using a receiver as her new phone is not compatible.  Unable to download today.   CGM Results from download: 07/29/2022 11/29/2022  % Time CGM active:   93 %   (Goal >70%) 36  Average glucose:   109 mg/dL for 14 days 098 X 14 days  Glucose management indicator:   5.9 %   Time in range (70-180 mg/dL):   99 %   (Goal >11%) 99  Time High (181-250 mg/dL):   0 %   (Goal < 91%) 0  Time Very High (>250 mg/dL):    0 %   (Goal < 5%) 0  Time Low (54-69 mg/dL):   <1 %   (Goal <4%) <1  Time Very Low (<54 mg/dL):   <1 %   (Goal <7%) 0    Weight hx: 158 lbs 11/29/2022 171 lbs 08/23/2022 183 lbs 07/08/2022 190 lbs  05/24/2022 - lost weight but regained with Thanksgiving (9 sweet potato pies) 189 lbs 03/06/2022 183 lbs 01/2022 193 lbs 07/20/2021 192 lbs 05/18/2021 187 lbs 03/15/2021 171 lbs 01/11/2020 155 lbs lowest adult weight 2018 195 lbs highest adult weight  10/17/2017 Goal weight 145 (when muscle mass was good but "may be different now")   Patient lives with her husband and mother.  They share shopping and cooking.  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  She is a Environmental manager (babies and dogs). Allergic to shellfish, flax seeds (N/V/D), most nuts cause discomfort and distress Does not tolerate red meat (except hamburger) Lactose intolerant Spinach and all greens causes diarrhea.  Lettuce does not cause symptoms and recently tolerated cooked kale. Doesn't tolerate raw onions Her husband is very supportive but enjoys feeding her and likes to snack at night. - States that she now understands. Increased stress this year. Planning on going to the gym with her husband.  Sister does wall palates and is considering this.  Weight 158 lb (71.7 kg). Body mass index is 29.85 kg/m.   Diabetes Self-Management Education - 11/29/22 1704       Visit Information   Visit Type Follow-up  Psychosocial Assessment   Patient Belief/Attitude about Diabetes Motivated to manage diabetes    Self-care barriers None    Self-management support Doctor's office;CDE visits    Other persons present Patient    Patient Concerns Nutrition/Meal planning;Healthy Lifestyle;Glycemic Control;Weight Control    Special Needs None    Preferred Learning Style No preference indicated    Learning Readiness Ready    How often do you need to have someone help you when you read instructions, pamphlets, or other written materials from your doctor or pharmacy? 1 - Never      Pre-Education Assessment   Patient understands the diabetes disease and treatment process. Demonstrates understanding /  competency    Patient understands incorporating nutritional management into lifestyle. Comprehends key points    Patient undertands incorporating physical activity into lifestyle. Comprehends key points    Patient understands using medications safely. Demonstrates understanding / competency    Patient understands monitoring blood glucose, interpreting and using results Demonstrates understanding / competency    Patient understands prevention, detection, and treatment of acute complications. Demonstrates understanding / competency    Patient understands prevention, detection, and treatment of chronic complications. Demonstrates understanding / competency    Patient understands how to develop strategies to address psychosocial issues. Demonstrates understanding / competency    Patient understands how to develop strategies to promote health/change behavior. Comprehends key points      Complications   Last HgB A1C per patient/outside source 6.1 %   10/12/2022 decreased from 7.5% 1/156/2024   How often do you check your blood sugar? > 4 times/day    Fasting Blood glucose range (mg/dL) 29-528    Postprandial Blood glucose range (mg/dL) 413-244    Number of hypoglycemic episodes per month 1      Dietary Intake   Breakfast plant based protein shake    Snack (morning) none    Lunch plant based protein shake, pear, hummus with rpingles    Snack (afternoon) skinny popcorn    Dinner tortilla, vege burger, lettuce, tomato, cheese, avocado (panini), cucumber    Snack (evening) nuts    Beverage(s) water, water with lemon blueberries cucumbers, plant based protein shake      Activity / Exercise   Activity / Exercise Type Light (walking / raking leaves)    How many days per week do you exercise? 2    How many minutes per day do you exercise? 30    Total minutes per week of exercise 60      Patient Education   Previous Diabetes Education Yes (please comment)   several visits since 2021   Healthy  Eating Meal options for control of blood glucose level and chronic complications.;Reviewed blood glucose goals for pre and post meals and how to evaluate the patients' food intake on their blood glucose level.    Being Active Role of exercise on diabetes management, blood pressure control and cardiac health.    Monitoring Taught/evaluated CGM (comment)    Diabetes Stress and Support Identified and addressed patients feelings and concerns about diabetes;Worked with patient to identify barriers to care and solutions      Patient Self-Evaluation of Goals - Patient rates self as meeting previously set goals (% of time)   Nutrition >75% (most of the time)    Physical Activity 50 - 75 % (half of the time)    Medications >75% (most of the time)    Monitoring >75% (most of the time)    Problem Solving and behavior change strategies  >  75% (most of the time)    Reducing Risk (treating acute and chronic complications) >75% (most of the time)    Health Coping >75% (most of the time)      Post-Education Assessment   Patient understands the diabetes disease and treatment process. Demonstrates understanding / competency    Patient understands incorporating nutritional management into lifestyle. Demonstrates understanding / competency    Patient undertands incorporating physical activity into lifestyle. Demonstrates understanding / competency    Patient understands using medications safely. Demonstrates understanding / competency    Patient understands monitoring blood glucose, interpreting and using results Demonstrates understanding / competency    Patient understands prevention, detection, and treatment of acute complications. Demonstrates understanding / competency    Patient understands prevention, detection, and treatment of chronic complications. Demonstrates understanding / competency    Patient understands how to develop strategies to address psychosocial issues. Demonstrates understanding /  competency    Patient understands how to develop strategies to promote health/change behavior. Comprehends key points      Outcomes   Expected Outcomes Demonstrated interest in learning. Expect positive outcomes    Future DMSE 3-4 months    Program Status Not Completed      Subsequent Visit   Since your last visit have you continued or begun to take your medications as prescribed? Yes    Since your last visit have you experienced any weight changes? Loss    Weight Loss (lbs) 13    Since your last visit, are you checking your blood glucose at least once a day? Yes             Individualized Plan for Diabetes Self-Management Training:   Learning Objective:  Patient will have a greater understanding of diabetes self-management. Patient education plan is to attend individual and/or group sessions per assessed needs and concerns.   Plan:   Patient Instructions  [employer] job on changes made! Continue to stay active Continue your eating plan Continue to drink mostly water  Find ways to increase your vegetable intake.  Expected Outcomes:  Demonstrated interest in learning. Expect positive outcomes  Education material provided: [employer] of Lifestyle Medicine) packet   If problems or questions, patient to contact team via:  Phone  Future DSME appointment: 3-4 months

## 2022-12-01 ENCOUNTER — Other Ambulatory Visit (HOSPITAL_COMMUNITY): Payer: Self-pay

## 2022-12-10 ENCOUNTER — Other Ambulatory Visit (HOSPITAL_COMMUNITY): Payer: Self-pay

## 2022-12-17 ENCOUNTER — Other Ambulatory Visit (HOSPITAL_COMMUNITY): Payer: Self-pay

## 2022-12-17 NOTE — Assessment & Plan Note (Deleted)
Encourage heart healthy diet such as MIND or DASH diet, increase exercise, avoid trans fats, simple carbohydrates and processed foods, consider a krill or fish or flaxseed oil cap daily. Tolerating Atorvastatin 

## 2022-12-17 NOTE — Assessment & Plan Note (Deleted)
Hydrate and monitor 

## 2022-12-17 NOTE — Progress Notes (Deleted)
Subjective:    Patient ID: Monique Wiggins, female    DOB: 05-08-1966, 57 y.o.   MRN: [id number]  No chief complaint on file.   HPI Discussed the use of AI scribe software for clinical note transcription with the patient, who gave verbal consent to proceed.  History of Present Illness         Patient is a 57 yo female in today for follow up on chronic medical concerns. No recent febrile illness or hospitalizations. Denies CP/palp/SOB/HA/congestion/fevers/GI or GU c/o. Taking meds as prescribed    Past Medical History:  Diagnosis Date  . Allergy   . Arthritis   . Back pain   . Carpal tunnel syndrome   . Diabetes mellitus without complication (HCC)   . DVT (deep venous thrombosis) (HCC)    after long car trip  . Dyspnea   . Gastroparesis   . GERD (gastroesophageal reflux disease)   . H. pylori infection    1997  . Heart murmur    ECHO 03/29/21  . Hypertension   . Hypertriglyceridemia 02/22/2017  . Hypertriglyceridemia 02/22/2017  . Migraines   . Sickle cell trait (HCC)   . Sleep apnea    states she no longer has OSA lost weight    Past Surgical History:  Procedure Laterality Date  . ABDOMINAL HYSTERECTOMY     ovaries left in place  . CARPAL TUNNEL RELEASE Right 01/19/2019   Procedure: ENDOSCOPIC CARPAL TUNNEL RELEASE;  Surgeon: Mack Hook, MD;  Location: [facility];  Service: Orthopedics;  Laterality: Right;  . CARPOMETACARPEL SUSPENSION PLASTY Right 01/19/2019   Procedure: RIGHT THUMB SUSPENSION ARTHROPLASTY;  Surgeon: Mack Hook, MD;  Location: [facility];  Service: Orthopedics;  Laterality: Right;  PREOP BLOCK  . CATARACT EXTRACTION Bilateral   . CHOLECYSTECTOMY  01/24/2021  . CRANIOTOMY Right 10/19/2022   Procedure: RIGHT POST auricular osteoma resection;  Surgeon: Coletta Memos, MD;  Location: [facility];  Service: Neurosurgery;  Laterality: Right;  RM 20  . TOE SURGERY     left great toe, repair of severed tendon  .  TONSILLECTOMY    . tummy tuck      Family History  Problem Relation Age of Onset  . Other Mother        nasal polyp  . Diabetes type II Mother   . High blood pressure Mother   . Diabetes Mellitus II Father   . Pulmonary fibrosis Father   . Hypertension Father   . Pulmonary disease Father   . High Cholesterol Father   . Hypertension Sister   . Diabetes Sister   . Allergic Disorder Son   . Hyperthyroidism Son   . Allergic Disorder Son   . Asthma Son   . Stomach cancer Maternal Grandmother        possibility that this was colon cancer instead-patient and her mother have differing recollection  . Cancer Maternal Grandfather        lung, smoker  . Esophageal cancer Neg Hx   . Pancreatic cancer Neg Hx   . Colon cancer Neg Hx     Social History   Socioeconomic History  . Marital status: Married    Spouse name: Alinda Money  . Number of children: 5  . Years of education: Not on file  . Highest education level: Not on file  Occupational History  . Occupation: Patient Care Tech [employer]  Tobacco Use  . Smoking status: Never  . Smokeless tobacco: Never  Vaping Use  . Vaping Use: Never used  Substance and Sexual Activity  . Alcohol use: Not Currently  . Drug use: No  . Sexual activity: Yes    Birth control/protection: Surgical  Other Topics Concern  . Not on file  Social History Narrative   ** Merged History Encounter **       Married   5 children (4 boys and 1 girl) All grown, has one daughter in [place], son in [place], son in [place], one in [place], youngest in Hotel manager in [place]   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 [employer]- photography classes   Enjoys carpentry, Human resources officer, Agricultural engineer      Lactose intolerant   Social Determinants of Corporate investment banker Strain: Not on file  Food Insecurity: Not on file  Transportation Needs: Not on file  Physical Activity: Not on file  Stress: Not on file  Social Connections: Not on file   Intimate Partner Violence: Not on file    Outpatient Medications Prior to Visit  Medication Sig Dispense Refill  . albuterol (VENTOLIN HFA) 108 (90 Base) MCG/ACT inhaler Inhale 2 puffs into the lungs every 6 (six) hours as needed for wheezing/shortness of breath. 6.7 g 5  . aluminum-magnesium hydroxide-simethicone (MAALOX) 200-200-20 MG/5ML SUSP Take 30 mLs by mouth 3 (three) times daily with meals as needed. (Patient not taking: Reported on 10/10/2022) 355 mL 0  . amLODipine (NORVASC) 5 MG tablet Take 1 tablet (5 mg total) by mouth daily. 90 tablet 1  . Ascorbic Acid (VITAMIN C PO) Take 1 capsule by mouth daily.    Marland Kitchen aspirin EC 81 MG tablet Take 81 mg by mouth daily. Swallow whole.    Marland Kitchen atorvastatin (LIPITOR) 20 MG tablet Take 1 tablet (20 mg total) by mouth daily. 90 tablet 1  . benzonatate (TESSALON) 100 MG capsule Take 1 capsule (100 mg total) by mouth 3 (three) times daily as needed for cough. (Patient not taking: Reported on 10/10/2022) 30 capsule 0  . Blood Glucose Monitoring Suppl (FREESTYLE LITE) w/Device KIT Use as directed to test blood sugar once a day 1 kit 0  . Budeson-Glycopyrrol-Formoterol (BREZTRI AEROSPHERE) 160-9-4.8 MCG/ACT AERO Inhale 2 puffs into the lungs in the morning and at bedtime. (Patient taking differently: Inhale 2 puffs into the lungs 2 (two) times daily as needed (shortness of breath).) 10.7 g 0  . carvedilol (COREG) 25 MG tablet Take 1 tablet (25 mg total) by mouth 2 (two) times daily with a meal. 180 tablet 1  . cetirizine (ZYRTEC) 10 MG tablet Take 1 tablet (10 mg total) by mouth daily. 90 tablet 3  . chlorhexidine (PERIDEX) 0.12 % solution 15 mLs 2 (two) times daily. (Patient not taking: Reported on 10/10/2022)    . cholecalciferol (VITAMIN D3) 25 MCG (1000 UNIT) tablet Take 1,000 Units by mouth daily.    . Continuous Blood Gluc Receiver (DEXCOM G6 RECEIVER) DEVI Use as directed to check blood sugars 1 each 0  . Continuous Glucose Sensor (DEXCOM G6 SENSOR) MISC  Check blood sugars as directed 3 each 3  . Continuous Glucose Transmitter (DEXCOM G6 TRANSMITTER) MISC Check blood sugars as directed 1 each 0  . Cyanocobalamin (B-12 SL) Place 1 tablet under the tongue daily.    . dapagliflozin propanediol (FARXIGA) 10 MG TABS tablet Take 1 tablet (10 mg total) by mouth daily. 90 tablet 1  . dicyclomine (BENTYL) 20 MG tablet Take 1 tablet (20 mg  total) by mouth 3 (three) times daily as needed. 20 tablet 0  . Digestive Enzymes (PAPAYA AND ENZYMES PO) Take 1 tablet by mouth daily.    . DULoxetine (CYMBALTA) 30 MG capsule Take 2 capsules (60 mg total) by mouth daily. 180 capsule 1  . fluticasone (FLONASE) 50 MCG/ACT nasal spray Place 2 sprays into both nostrils daily as needed for allergies. 48 g 1  . fluticasone (FLONASE) 50 MCG/ACT nasal spray Place 2 sprays into both nostrils daily. (Patient not taking: Reported on 10/10/2022) 16 g 1  . fluticasone-salmeterol (ADVAIR DISKUS) 500-50 MCG/ACT AEPB Inhale 1 puff into the lungs in the morning and at bedtime. (Patient taking differently: Inhale 1 puff into the lungs daily.) 60 each 3  . gabapentin (NEURONTIN) 300 MG capsule Take 1 capsule (300 mg) by mouth at bedtime. 90 capsule 1  . glipiZIDE (GLUCOTROL) 5 MG tablet Take 1/2 tablet (2.5 mg total) by mouth daily before breakfast. 45 tablet 3  . glucose blood test strip Use as directed to test blood sugar daily in the afternoon 100 each 3  . hydrochlorothiazide (HYDRODIURIL) 25 MG tablet Take 1 tablet (25 mg total) by mouth daily. 90 tablet 1  . HYDROcodone-acetaminophen (NORCO/VICODIN) 5-325 MG tablet Take 1 tablet by mouth every 6 (six) hours as needed for moderate pain 30 tablet 0  . hyoscyamine (ANASPAZ) 0.125 MG TBDP disintergrating tablet Place 1 tablet (0.125 mg total) under the tongue every 6 (six) hours as needed for cramping. 60 tablet 2  . Lancets (FREESTYLE) lancets Use as directed to test once a day 100 each 3  . linaclotide (LINZESS) 145 MCG CAPS capsule Take  1 capsule (145 mcg total) by mouth daily before breakfast. (Patient not taking: Reported on 10/10/2022) 30 capsule 2  . loperamide (IMODIUM) 2 MG capsule Take 1 capsule (2 mg total) by mouth 4 (four) times daily as needed for diarrhea or loose stools. 12 capsule 0  . losartan (COZAAR) 100 MG tablet Take 1 tablet (100 mg total) by mouth daily. Take with HCTZ 90 tablet 1  . MAGNESIUM PO Take 1 tablet by mouth daily.    . metFORMIN (GLUCOPHAGE) 500 MG tablet Take 1 tablet (500 mg total) by mouth daily with breakfast. (Patient not taking: Reported on 10/10/2022) 90 tablet 3  . methylPREDNISolone (MEDROL) 4 MG TBPK tablet Take as directed on 6 day dose pack. (Patient not taking: Reported on 10/10/2022) 21 tablet 0  . mometasone (ELOCON) 0.1 % cream Apply to affected area daily. (Patient taking differently: Apply 1 Application topically daily as needed (irritation).) 135 g 1  . Multiple Vitamin (MULTIVITAMIN WITH MINERALS) TABS tablet Take 1 tablet by mouth daily.    . Multiple Vitamins-Minerals (HAIR SKIN & NAILS) TABS Take 1 tablet by mouth daily.    . Multiple Vitamins-Minerals (ZINC PO) Take 1 tablet by mouth daily.    . Omega-3 Fatty Acids (FISH OIL PO) Take 1 capsule by mouth daily.    Marland Kitchen omeprazole (PRILOSEC) 40 MG capsule Take 1 capsule (40 mg total) by mouth daily. 90 capsule 1  . ondansetron (ZOFRAN-ODT) 4 MG disintegrating tablet Dissolve 1 tablet (4 mg total) by mouth every 8 (eight) hours as needed. 20 tablet 0  . Polyvinyl Alcohol-Povidone (CLEAR EYES NATURAL TEARS OP) Place 1 drop into both eyes daily as needed (dry eyes).    . potassium chloride SA (KLOR-CON M) 20 MEQ tablet TAKE TWO TABLETS BY MOUTH ON TUESDAY AND SATURDAY AND ONE TABLET ALL OTHER DAYS. 114  tablet 1  . psyllium (REGULOID) 0.52 g capsule Take 0.52 g by mouth daily.    . sertraline (ZOLOFT) 50 MG tablet Take 1 tablet (50 mg total) by mouth at bedtime. 90 tablet 1  . tirzepatide (MOUNJARO) 12.5 MG/0.5ML Pen Inject 12.5 mg into  the skin once a week. 6 mL 1  . topiramate (TOPAMAX) 50 MG tablet Take 1 tablet (50 mg total) by mouth daily. 90 tablet 1  . tretinoin (RETIN-A) 0.025 % cream Apply to affected area at bedtime. 135 g 1   No facility-administered medications prior to visit.    Allergies  Allergen Reactions  . Pollen Extract Swelling  . Shellfish Allergy Swelling    Review of Systems  Constitutional:  Negative for fever and malaise/fatigue.  HENT:  Negative for congestion.   Eyes:  Negative for blurred vision.  Respiratory:  Negative for shortness of breath.   Cardiovascular:  Negative for chest pain, palpitations and leg swelling.  Gastrointestinal:  Negative for abdominal pain, blood in stool and nausea.  Genitourinary:  Negative for dysuria and frequency.  Musculoskeletal:  Negative for falls.  Skin:  Negative for rash.  Neurological:  Negative for dizziness, loss of consciousness and headaches.  Endo/Heme/Allergies:  Negative for environmental allergies.  Psychiatric/Behavioral:  Negative for depression. The patient is not nervous/anxious.       Objective:    Physical Exam Constitutional:      General: She is not in acute distress.    Appearance: Normal appearance. She is well-developed. She is not toxic-appearing.  HENT:     Head: Normocephalic and atraumatic.     Right Ear: External ear normal.     Left Ear: External ear normal.     Nose: Nose normal.  Eyes:     General:        Right eye: No discharge.        Left eye: No discharge.     Conjunctiva/sclera: Conjunctivae normal.  Neck:     Thyroid: No thyromegaly.  Cardiovascular:     Rate and Rhythm: Normal rate and regular rhythm.     Heart sounds: Normal heart sounds. No murmur heard. Pulmonary:     Effort: Pulmonary effort is normal. No respiratory distress.     Breath sounds: Normal breath sounds.  Abdominal:     General: Bowel sounds are normal.     Palpations: Abdomen is soft.     Tenderness: There is no abdominal  tenderness. There is no guarding.  Musculoskeletal:        General: Normal range of motion.     Cervical back: Neck supple.  Lymphadenopathy:     Cervical: No cervical adenopathy.  Skin:    General: Skin is warm and dry.  Neurological:     Mental Status: She is alert and oriented to person, place, and time.  Psychiatric:        Mood and Affect: Mood normal.        Behavior: Behavior normal.        Thought Content: Thought content normal.        Judgment: Judgment normal.   There were no vitals taken for this visit. Wt Readings from Last 3 Encounters:  11/29/22 158 lb (71.7 kg)  10/19/22 160 lb (72.6 kg)  10/12/22 161 lb 6.4 oz (73.2 kg)    Diabetic Foot Exam - Simple   No data filed    Lab Results  Component Value Date   WBC 8.5 10/12/2022   HGB 13.8  10/12/2022   HCT 42.3 10/12/2022   PLT 347 10/12/2022   GLUCOSE 83 10/09/2022   CHOL 154 07/03/2022   TRIG 266.0 (H) 07/03/2022   HDL 53.40 07/03/2022   LDLDIRECT 36.0 07/03/2022   LDLCALC 19 08/31/2021   ALT 35 10/09/2022   AST 21 10/09/2022   NA 137 10/09/2022   K 3.7 10/09/2022   CL 103 10/09/2022   CREATININE 1.31 (H) 10/09/2022   BUN 35 (H) 10/09/2022   CO2 25 10/09/2022   TSH 0.87 07/03/2022   HGBA1C 6.1 10/09/2022   MICROALBUR 1.0 11/27/2021    Lab Results  Component Value Date   TSH 0.87 07/03/2022   Lab Results  Component Value Date   WBC 8.5 10/12/2022   HGB 13.8 10/12/2022   HCT 42.3 10/12/2022   MCV 83.9 10/12/2022   PLT 347 10/12/2022   Lab Results  Component Value Date   NA 137 10/09/2022   K 3.7 10/09/2022   CO2 25 10/09/2022   GLUCOSE 83 10/09/2022   BUN 35 (H) 10/09/2022   CREATININE 1.31 (H) 10/09/2022   BILITOT 0.4 10/09/2022   ALKPHOS 75 10/09/2022   AST 21 10/09/2022   ALT 35 10/09/2022   PROT 7.2 10/09/2022   ALBUMIN 4.6 10/09/2022   CALCIUM 9.3 10/09/2022   ANIONGAP 8 03/13/2022   GFR 45.07 (L) 10/09/2022   Lab Results  Component Value Date   CHOL 154 07/03/2022    Lab Results  Component Value Date   HDL 53.40 07/03/2022   Lab Results  Component Value Date   LDLCALC 19 08/31/2021   Lab Results  Component Value Date   TRIG 266.0 (H) 07/03/2022   Lab Results  Component Value Date   CHOLHDL 3 07/03/2022   Lab Results  Component Value Date   HGBA1C 6.1 10/09/2022       Assessment & Plan:  Hyperlipidemia, unspecified hyperlipidemia type Assessment & Plan: Encourage heart healthy diet such as MIND or DASH diet, increase exercise, avoid trans fats, simple carbohydrates and processed foods, consider a krill or fish or flaxseed oil cap daily. Tolerating Atorvastatin     Assessment and Plan              Danise Edge, MD

## 2022-12-17 NOTE — Assessment & Plan Note (Deleted)
hgba1c acceptable, minimize simple carbs. Increase exercise as tolerated. Continue current meds, patient is started on Mounjaro 

## 2022-12-17 NOTE — Assessment & Plan Note (Deleted)
Encouraged DASH or MIND diet, decrease po intake and increase exercise as tolerated. Needs 7-8 hours of sleep nightly. Avoid trans fats, eat small, frequent meals every 4-5 hours with lean proteins, complex carbs and healthy fats. Minimize simple carbs, high fat foods and processed foods tolerating Mounjaro

## 2022-12-18 ENCOUNTER — Ambulatory Visit: Payer: Commercial Managed Care - PPO | Admitting: Family Medicine

## 2022-12-18 DIAGNOSIS — E785 Hyperlipidemia, unspecified: Secondary | ICD-10-CM

## 2022-12-18 DIAGNOSIS — E669 Obesity, unspecified: Secondary | ICD-10-CM

## 2022-12-18 DIAGNOSIS — E1165 Type 2 diabetes mellitus with hyperglycemia: Secondary | ICD-10-CM

## 2022-12-18 DIAGNOSIS — N289 Disorder of kidney and ureter, unspecified: Secondary | ICD-10-CM

## 2022-12-21 ENCOUNTER — Other Ambulatory Visit (HOSPITAL_COMMUNITY): Payer: Self-pay

## 2022-12-21 ENCOUNTER — Other Ambulatory Visit: Payer: Self-pay

## 2022-12-28 ENCOUNTER — Other Ambulatory Visit (HOSPITAL_COMMUNITY): Payer: Self-pay

## 2022-12-31 NOTE — Assessment & Plan Note (Signed)
 hgba1c acceptable, minimize simple carbs. Increase exercise as tolerated. Continue current meds 

## 2022-12-31 NOTE — Progress Notes (Signed)
Subjective:    Patient ID: Monique Wiggins, female    DOB: Feb 03, 1966, 57 y.o.   MRN: [id number]  No chief complaint on file.   HPI Discussed the use of AI scribe software for clinical note transcription with the patient, who gave verbal consent to proceed.  History of Present Illness        Patient is a 57 yo female in today for follow up on chronic medical concerns. No recent febrile illness or hospitalizations. Denies CP/palp/SOB/HA/congestion/fevers/GI or GU c/o. Taking meds as prescribed     Past Medical History:  Diagnosis Date  . Allergy   . Arthritis   . Back pain   . Carpal tunnel syndrome   . Diabetes mellitus without complication (HCC)   . DVT (deep venous thrombosis) (HCC)    after long car trip  . Dyspnea   . Gastroparesis   . GERD (gastroesophageal reflux disease)   . H. pylori infection    1997  . Heart murmur    ECHO 03/29/21  . Hypertension   . Hypertriglyceridemia 02/22/2017  . Hypertriglyceridemia 02/22/2017  . Migraines   . Sickle cell trait (HCC)   . Sleep apnea    states she no longer has OSA lost weight    Past Surgical History:  Procedure Laterality Date  . ABDOMINAL HYSTERECTOMY     ovaries left in place  . CARPAL TUNNEL RELEASE Right 01/19/2019   Procedure: ENDOSCOPIC CARPAL TUNNEL RELEASE;  Surgeon: Mack Hook, MD;  Location: [facility];  Service: Orthopedics;  Laterality: Right;  . CARPOMETACARPEL SUSPENSION PLASTY Right 01/19/2019   Procedure: RIGHT THUMB SUSPENSION ARTHROPLASTY;  Surgeon: Mack Hook, MD;  Location: [facility];  Service: Orthopedics;  Laterality: Right;  PREOP BLOCK  . CATARACT EXTRACTION Bilateral   . CHOLECYSTECTOMY  01/24/2021  . CRANIOTOMY Right 10/19/2022   Procedure: RIGHT POST auricular osteoma resection;  Surgeon: Coletta Memos, MD;  Location: [facility] OR;  Service: Neurosurgery;  Laterality: Right;  RM 20  . TOE SURGERY     left great toe, repair of severed tendon  .  TONSILLECTOMY    . tummy tuck      Family History  Problem Relation Age of Onset  . Other Mother        nasal polyp  . Diabetes type II Mother   . High blood pressure Mother   . Diabetes Mellitus II Father   . Pulmonary fibrosis Father   . Hypertension Father   . Pulmonary disease Father   . High Cholesterol Father   . Hypertension Sister   . Diabetes Sister   . Allergic Disorder Son   . Hyperthyroidism Son   . Allergic Disorder Son   . Asthma Son   . Stomach cancer Maternal Grandmother        possibility that this was colon cancer instead-patient and her mother have differing recollection  . Cancer Maternal Grandfather        lung, smoker  . Esophageal cancer Neg Hx   . Pancreatic cancer Neg Hx   . Colon cancer Neg Hx     Social History   Socioeconomic History  . Marital status: Married    Spouse name: Alinda Money  . Number of children: 5  . Years of education: Not on file  . Highest education level: Not on file  Occupational History  . Occupation: Patient Care Tech [employer]  Tobacco Use  . Smoking status: Never  . Smokeless tobacco: Never  Vaping Use  . Vaping status: Never Used  Substance and Sexual Activity  . Alcohol use: Not Currently  . Drug use: No  . Sexual activity: Yes    Birth control/protection: Surgical  Other Topics Concern  . Not on file  Social History Narrative   ** Merged History Encounter **       Married   5 children (4 boys and 1 girl) All grown, has one daughter in [place], son in [place], son in [place], one in [place], youngest in Hotel manager in [place]   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 [employer]- photography classes   Enjoys carpentry, Human resources officer, Agricultural engineer      Lactose intolerant   Social Determinants of Corporate investment banker Strain: Not on file  Food Insecurity: Not on file  Transportation Needs: Not on file  Physical Activity: Not on file  Stress: Not on file  Social Connections: Not on  file  Intimate Partner Violence: Not on file    Outpatient Medications Prior to Visit  Medication Sig Dispense Refill  . albuterol (VENTOLIN HFA) 108 (90 Base) MCG/ACT inhaler Inhale 2 puffs into the lungs every 6 (six) hours as needed for wheezing/shortness of breath. 6.7 g 5  . aluminum-magnesium hydroxide-simethicone (MAALOX) 200-200-20 MG/5ML SUSP Take 30 mLs by mouth 3 (three) times daily with meals as needed. (Patient not taking: Reported on 10/10/2022) 355 mL 0  . amLODipine (NORVASC) 5 MG tablet Take 1 tablet (5 mg total) by mouth daily. 90 tablet 1  . Ascorbic Acid (VITAMIN C PO) Take 1 capsule by mouth daily.    Marland Kitchen aspirin EC 81 MG tablet Take 81 mg by mouth daily. Swallow whole.    Marland Kitchen atorvastatin (LIPITOR) 20 MG tablet Take 1 tablet (20 mg total) by mouth daily. 90 tablet 1  . benzonatate (TESSALON) 100 MG capsule Take 1 capsule (100 mg total) by mouth 3 (three) times daily as needed for cough. (Patient not taking: Reported on 10/10/2022) 30 capsule 0  . Blood Glucose Monitoring Suppl (FREESTYLE LITE) w/Device KIT Use as directed to test blood sugar once a day 1 kit 0  . Budeson-Glycopyrrol-Formoterol (BREZTRI AEROSPHERE) 160-9-4.8 MCG/ACT AERO Inhale 2 puffs into the lungs in the morning and at bedtime. (Patient taking differently: Inhale 2 puffs into the lungs 2 (two) times daily as needed (shortness of breath).) 10.7 g 0  . carvedilol (COREG) 25 MG tablet Take 1 tablet (25 mg total) by mouth 2 (two) times daily with a meal. 180 tablet 1  . cetirizine (ZYRTEC) 10 MG tablet Take 1 tablet (10 mg total) by mouth daily. 90 tablet 3  . chlorhexidine (PERIDEX) 0.12 % solution 15 mLs 2 (two) times daily. (Patient not taking: Reported on 10/10/2022)    . cholecalciferol (VITAMIN D3) 25 MCG (1000 UNIT) tablet Take 1,000 Units by mouth daily.    . Continuous Blood Gluc Receiver (DEXCOM G6 RECEIVER) DEVI Use as directed to check blood sugars 1 each 0  . Continuous Glucose Sensor (DEXCOM G6 SENSOR)  MISC Check blood sugars as directed 3 each 3  . Continuous Glucose Transmitter (DEXCOM G6 TRANSMITTER) MISC Check blood sugars as directed 1 each 0  . Cyanocobalamin (B-12 SL) Place 1 tablet under the tongue daily.    . dapagliflozin propanediol (FARXIGA) 10 MG TABS tablet Take 1 tablet (10 mg total) by mouth daily. 90 tablet 1  . dicyclomine (BENTYL) 20 MG tablet Take 1 tablet (20 mg  total) by mouth 3 (three) times daily as needed. 20 tablet 0  . Digestive Enzymes (PAPAYA AND ENZYMES PO) Take 1 tablet by mouth daily.    . DULoxetine (CYMBALTA) 30 MG capsule Take 2 capsules (60 mg total) by mouth daily. 180 capsule 1  . fluticasone (FLONASE) 50 MCG/ACT nasal spray Place 2 sprays into both nostrils daily as needed for allergies. 48 g 1  . fluticasone (FLONASE) 50 MCG/ACT nasal spray Place 2 sprays into both nostrils daily. (Patient not taking: Reported on 10/10/2022) 16 g 1  . fluticasone-salmeterol (ADVAIR DISKUS) 500-50 MCG/ACT AEPB Inhale 1 puff into the lungs in the morning and at bedtime. (Patient taking differently: Inhale 1 puff into the lungs daily.) 60 each 3  . gabapentin (NEURONTIN) 300 MG capsule Take 1 capsule (300 mg) by mouth at bedtime. 90 capsule 1  . glipiZIDE (GLUCOTROL) 5 MG tablet Take 1/2 tablet (2.5 mg total) by mouth daily before breakfast. 45 tablet 3  . glucose blood test strip Use as directed to test blood sugar daily in the afternoon 100 each 3  . hydrochlorothiazide (HYDRODIURIL) 25 MG tablet Take 1 tablet (25 mg total) by mouth daily. 90 tablet 1  . HYDROcodone-acetaminophen (NORCO/VICODIN) 5-325 MG tablet Take 1 tablet by mouth every 6 (six) hours as needed for moderate pain 30 tablet 0  . hyoscyamine (ANASPAZ) 0.125 MG TBDP disintergrating tablet Place 1 tablet (0.125 mg total) under the tongue every 6 (six) hours as needed for cramping. 60 tablet 2  . Lancets (FREESTYLE) lancets Use as directed to test once a day 100 each 3  . linaclotide (LINZESS) 145 MCG CAPS capsule  Take 1 capsule (145 mcg total) by mouth daily before breakfast. (Patient not taking: Reported on 10/10/2022) 30 capsule 2  . loperamide (IMODIUM) 2 MG capsule Take 1 capsule (2 mg total) by mouth 4 (four) times daily as needed for diarrhea or loose stools. 12 capsule 0  . losartan (COZAAR) 100 MG tablet Take 1 tablet (100 mg total) by mouth daily. Take with HCTZ 90 tablet 1  . MAGNESIUM PO Take 1 tablet by mouth daily.    . metFORMIN (GLUCOPHAGE) 500 MG tablet Take 1 tablet (500 mg total) by mouth daily with breakfast. (Patient not taking: Reported on 10/10/2022) 90 tablet 3  . methylPREDNISolone (MEDROL) 4 MG TBPK tablet Take as directed on 6 day dose pack. (Patient not taking: Reported on 10/10/2022) 21 tablet 0  . mometasone (ELOCON) 0.1 % cream Apply to affected area daily. (Patient taking differently: Apply 1 Application topically daily as needed (irritation).) 135 g 1  . Multiple Vitamin (MULTIVITAMIN WITH MINERALS) TABS tablet Take 1 tablet by mouth daily.    . Multiple Vitamins-Minerals (HAIR SKIN & NAILS) TABS Take 1 tablet by mouth daily.    . Multiple Vitamins-Minerals (ZINC PO) Take 1 tablet by mouth daily.    . Omega-3 Fatty Acids (FISH OIL PO) Take 1 capsule by mouth daily.    Marland Kitchen omeprazole (PRILOSEC) 40 MG capsule Take 1 capsule (40 mg total) by mouth daily. 90 capsule 1  . ondansetron (ZOFRAN-ODT) 4 MG disintegrating tablet Dissolve 1 tablet (4 mg total) by mouth every 8 (eight) hours as needed. 20 tablet 0  . Polyvinyl Alcohol-Povidone (CLEAR EYES NATURAL TEARS OP) Place 1 drop into both eyes daily as needed (dry eyes).    . potassium chloride SA (KLOR-CON M) 20 MEQ tablet TAKE TWO TABLETS BY MOUTH ON TUESDAY AND SATURDAY AND ONE TABLET ALL OTHER DAYS. 114  tablet 1  . psyllium (REGULOID) 0.52 g capsule Take 0.52 g by mouth daily.    . sertraline (ZOLOFT) 50 MG tablet Take 1 tablet (50 mg total) by mouth at bedtime. 90 tablet 1  . tirzepatide (MOUNJARO) 12.5 MG/0.5ML Pen Inject 12.5 mg  into the skin once a week. 6 mL 1  . topiramate (TOPAMAX) 50 MG tablet Take 1 tablet (50 mg total) by mouth daily. 90 tablet 1  . tretinoin (RETIN-A) 0.025 % cream Apply to affected area at bedtime. 135 g 1   No facility-administered medications prior to visit.    Allergies  Allergen Reactions  . Pollen Extract Swelling  . Shellfish Allergy Swelling    Review of Systems  Constitutional:  Negative for fever and malaise/fatigue.  HENT:  Negative for congestion.   Eyes:  Negative for blurred vision.  Respiratory:  Negative for shortness of breath.   Cardiovascular:  Negative for chest pain, palpitations and leg swelling.  Gastrointestinal:  Negative for abdominal pain, blood in stool and nausea.  Genitourinary:  Negative for dysuria and frequency.  Musculoskeletal:  Negative for falls.  Skin:  Negative for rash.  Neurological:  Negative for dizziness, loss of consciousness and headaches.  Endo/Heme/Allergies:  Negative for environmental allergies.  Psychiatric/Behavioral:  Negative for depression. The patient is not nervous/anxious.       Objective:    Physical Exam Constitutional:      General: She is not in acute distress.    Appearance: Normal appearance. She is well-developed. She is not toxic-appearing.  HENT:     Head: Normocephalic and atraumatic.     Right Ear: External ear normal.     Left Ear: External ear normal.     Nose: Nose normal.  Eyes:     General:        Right eye: No discharge.        Left eye: No discharge.     Conjunctiva/sclera: Conjunctivae normal.  Neck:     Thyroid: No thyromegaly.  Cardiovascular:     Rate and Rhythm: Normal rate and regular rhythm.     Heart sounds: Normal heart sounds. No murmur heard. Pulmonary:     Effort: Pulmonary effort is normal. No respiratory distress.     Breath sounds: Normal breath sounds.  Abdominal:     General: Bowel sounds are normal.     Palpations: Abdomen is soft.     Tenderness: There is no abdominal  tenderness. There is no guarding.  Musculoskeletal:        General: Normal range of motion.     Cervical back: Neck supple.  Lymphadenopathy:     Cervical: No cervical adenopathy.  Skin:    General: Skin is warm and dry.  Neurological:     Mental Status: She is alert and oriented to person, place, and time.  Psychiatric:        Mood and Affect: Mood normal.        Behavior: Behavior normal.        Thought Content: Thought content normal.        Judgment: Judgment normal.   There were no vitals taken for this visit. Wt Readings from Last 3 Encounters:  11/29/22 158 lb (71.7 kg)  10/19/22 160 lb (72.6 kg)  10/12/22 161 lb 6.4 oz (73.2 kg)    Diabetic Foot Exam - Simple   No data filed    Lab Results  Component Value Date   WBC 8.5 10/12/2022   HGB 13.8  10/12/2022   HCT 42.3 10/12/2022   PLT 347 10/12/2022   GLUCOSE 83 10/09/2022   CHOL 154 07/03/2022   TRIG 266.0 (H) 07/03/2022   HDL 53.40 07/03/2022   LDLDIRECT 36.0 07/03/2022   LDLCALC 19 08/31/2021   ALT 35 10/09/2022   AST 21 10/09/2022   NA 137 10/09/2022   K 3.7 10/09/2022   CL 103 10/09/2022   CREATININE 1.31 (H) 10/09/2022   BUN 35 (H) 10/09/2022   CO2 25 10/09/2022   TSH 0.87 07/03/2022   HGBA1C 6.1 10/09/2022   MICROALBUR 1.0 11/27/2021    Lab Results  Component Value Date   TSH 0.87 07/03/2022   Lab Results  Component Value Date   WBC 8.5 10/12/2022   HGB 13.8 10/12/2022   HCT 42.3 10/12/2022   MCV 83.9 10/12/2022   PLT 347 10/12/2022   Lab Results  Component Value Date   NA 137 10/09/2022   K 3.7 10/09/2022   CO2 25 10/09/2022   GLUCOSE 83 10/09/2022   BUN 35 (H) 10/09/2022   CREATININE 1.31 (H) 10/09/2022   BILITOT 0.4 10/09/2022   ALKPHOS 75 10/09/2022   AST 21 10/09/2022   ALT 35 10/09/2022   PROT 7.2 10/09/2022   ALBUMIN 4.6 10/09/2022   CALCIUM 9.3 10/09/2022   ANIONGAP 8 03/13/2022   GFR 45.07 (L) 10/09/2022   Lab Results  Component Value Date   CHOL 154 07/03/2022    Lab Results  Component Value Date   HDL 53.40 07/03/2022   Lab Results  Component Value Date   LDLCALC 19 08/31/2021   Lab Results  Component Value Date   TRIG 266.0 (H) 07/03/2022   Lab Results  Component Value Date   CHOLHDL 3 07/03/2022   Lab Results  Component Value Date   HGBA1C 6.1 10/09/2022       Assessment & Plan:  Hyperlipidemia, unspecified hyperlipidemia type Assessment & Plan: Encourage heart healthy diet such as MIND or DASH diet, increase exercise, avoid trans fats, simple carbohydrates and processed foods, consider a krill or fish or flaxseed oil cap daily. Tolerating Atorvastatin   Type 2 diabetes mellitus with hyperglycemia, without long-term current use of insulin (HCC) Assessment & Plan: hgba1c acceptable, minimize simple carbs. Increase exercise as tolerated. Continue current meds    Renal insufficiency Assessment & Plan: Hydrate and monitor    Obesity, unspecified classification, unspecified obesity type, unspecified whether serious comorbidity present Assessment & Plan: Encouraged DASH or MIND diet, decrease po intake and increase exercise as tolerated. Needs 7-8 hours of sleep nightly. Avoid trans fats, eat small, frequent meals every 4-5 hours with lean proteins, complex carbs and healthy fats. Minimize simple carbs, high fat foods and processed foods      Assessment and Plan              Danise Edge, MD

## 2022-12-31 NOTE — Assessment & Plan Note (Signed)
Encourage heart healthy diet such as MIND or DASH diet, increase exercise, avoid trans fats, simple carbohydrates and processed foods, consider a krill or fish or flaxseed oil cap daily. Tolerating Atorvastatin 

## 2022-12-31 NOTE — Assessment & Plan Note (Signed)
 Encouraged DASH or MIND diet, decrease po intake and increase exercise as tolerated. Needs 7-8 hours of sleep nightly. Avoid trans fats, eat small, frequent meals every 4-5 hours with lean proteins, complex carbs and healthy fats. Minimize simple carbs, high fat foods and processed foods 

## 2022-12-31 NOTE — Assessment & Plan Note (Signed)
 Hydrate and monitor 

## 2023-01-01 ENCOUNTER — Ambulatory Visit (INDEPENDENT_AMBULATORY_CARE_PROVIDER_SITE_OTHER): Payer: Commercial Managed Care - PPO | Admitting: Family Medicine

## 2023-01-01 VITALS — BP 124/78 | HR 72 | Temp 97.7°F | Resp 16 | Ht 61.0 in | Wt 154.6 lb

## 2023-01-01 DIAGNOSIS — E669 Obesity, unspecified: Secondary | ICD-10-CM | POA: Diagnosis not present

## 2023-01-01 DIAGNOSIS — Z7985 Long-term (current) use of injectable non-insulin antidiabetic drugs: Secondary | ICD-10-CM | POA: Diagnosis not present

## 2023-01-01 DIAGNOSIS — N289 Disorder of kidney and ureter, unspecified: Secondary | ICD-10-CM

## 2023-01-01 DIAGNOSIS — Z6829 Body mass index (BMI) 29.0-29.9, adult: Secondary | ICD-10-CM

## 2023-01-01 DIAGNOSIS — M549 Dorsalgia, unspecified: Secondary | ICD-10-CM | POA: Diagnosis not present

## 2023-01-01 DIAGNOSIS — E1165 Type 2 diabetes mellitus with hyperglycemia: Secondary | ICD-10-CM | POA: Diagnosis not present

## 2023-01-01 DIAGNOSIS — E785 Hyperlipidemia, unspecified: Secondary | ICD-10-CM | POA: Diagnosis not present

## 2023-01-01 NOTE — Patient Instructions (Signed)
Acute Back Pain, Adult Acute back pain is sudden and usually short-lived. It is often caused by an injury to the muscles and tissues in the back. The injury may result from: A muscle, tendon, or ligament getting overstretched or torn. Ligaments are tissues that connect bones to each other. Lifting something improperly can cause a back strain. Wear and tear (degeneration) of the spinal disks. Spinal disks are circular tissue that provide cushioning between the bones of the spine (vertebrae). Twisting motions, such as while playing sports or doing yard work. A hit to the back. Arthritis. You may have a physical exam, lab tests, and imaging tests to find the cause of your pain. Acute back pain usually goes away with rest and home care. Follow these instructions at home: Managing pain, stiffness, and swelling Take over-the-counter and prescription medicines only as told by your health care provider. Treatment may include medicines for pain and inflammation that are taken by mouth or applied to the skin, or muscle relaxants. Your health care provider may recommend applying ice during the first 24-48 hours after your pain starts. To do this: Put ice in a plastic bag. Place a towel between your skin and the bag. Leave the ice on for 20 minutes, 2-3 times a day. Remove the ice if your skin turns bright red. This is very important. If you cannot feel pain, heat, or cold, you have a greater risk of damage to the area. If directed, apply heat to the affected area as often as told by your health care provider. Use the heat source that your health care provider recommends, such as a moist heat pack or a heating pad. Place a towel between your skin and the heat source. Leave the heat on for 20-30 minutes. Remove the heat if your skin turns bright red. This is especially important if you are unable to feel pain, heat, or cold. You have a greater risk of getting burned. Activity  Do not stay in bed. Staying in  bed for more than 1-2 days can delay your recovery. Sit up and stand up straight. Avoid leaning forward when you sit or hunching over when you stand. If you work at a desk, sit close to it so you do not need to lean over. Keep your chin tucked in. Keep your neck drawn back, and keep your elbows bent at a 90-degree angle (right angle). Sit high and close to the steering wheel when you drive. Add lower back (lumbar) support to your car seat, if needed. Take short walks on even surfaces as soon as you are able. Try to increase the length of time you walk each day. Do not sit, drive, or stand in one place for more than 30 minutes at a time. Sitting or standing for long periods of time can put stress on your back. Do not drive or use heavy machinery while taking prescription pain medicine. Use proper lifting techniques. When you bend and lift, use positions that put less stress on your back: Bend your knees. Keep the load close to your body. Avoid twisting. Exercise regularly as told by your health care provider. Exercising helps your back heal faster and helps prevent back injuries by keeping muscles strong and flexible. Work with a physical therapist to make a safe exercise program, as recommended by your health care provider. Do any exercises as told by your physical therapist. Lifestyle Maintain a healthy weight. Extra weight puts stress on your back and makes it difficult to have good   posture. Avoid activities or situations that make you feel anxious or stressed. Stress and anxiety increase muscle tension and can make back pain worse. Learn ways to manage anxiety and stress, such as through exercise. General instructions Sleep on a firm mattress in a comfortable position. Try lying on your side with your knees slightly bent. If you lie on your back, put a pillow under your knees. Keep your head and neck in a straight line with your spine (neutral position) when using electronic equipment like  smartphones or pads. To do this: Raise your smartphone or pad to look at it instead of bending your head or neck to look down. Put the smartphone or pad at the level of your face while looking at the screen. Follow your treatment plan as told by your health care provider. This may include: Cognitive or behavioral therapy. Acupuncture or massage therapy. Meditation or yoga. Contact a health care provider if: You have pain that is not relieved with rest or medicine. You have increasing pain going down into your legs or buttocks. Your pain does not improve after 2 weeks. You have pain at night. You lose weight without trying. You have a fever or chills. You develop nausea or vomiting. You develop abdominal pain. Get help right away if: You develop new bowel or bladder control problems. You have unusual weakness or numbness in your arms or legs. You feel faint. These symptoms may represent a serious problem that is an emergency. Do not wait to see if the symptoms will go away. Get medical help right away. Call your local emergency services (911 in the [place] Do not drive yourself to the hospital. Summary Acute back pain is sudden and usually short-lived. Use proper lifting techniques. When you bend and lift, use positions that put less stress on your back. Take over-the-counter and prescription medicines only as told by your health care provider, and apply heat or ice as told. This information is not intended to replace advice given to you by your health care provider. Make sure you discuss any questions you have with your health care provider. Document Revised: 08/26/2020 Document Reviewed: 08/26/2020 Elsevier Patient Education  2024 [employer]  

## 2023-01-02 ENCOUNTER — Encounter: Payer: Self-pay | Admitting: Family Medicine

## 2023-01-02 LAB — CBC WITH DIFFERENTIAL/PLATELET
Basophils Absolute: 0.1 10*3/uL (ref 0.0–0.1)
Basophils Relative: 1.1 % (ref 0.0–3.0)
Eosinophils Absolute: 0.3 10*3/uL (ref 0.0–0.7)
Eosinophils Relative: 3.6 % (ref 0.0–5.0)
HCT: 41.5 % (ref 36.0–46.0)
Hemoglobin: 13.4 g/dL (ref 12.0–15.0)
Lymphocytes Relative: 30.1 % (ref 12.0–46.0)
Lymphs Abs: 2.4 10*3/uL (ref 0.7–4.0)
MCHC: 32.3 g/dL (ref 30.0–36.0)
MCV: 84.4 fl (ref 78.0–100.0)
Monocytes Absolute: 0.5 10*3/uL (ref 0.1–1.0)
Monocytes Relative: 6 % (ref 3.0–12.0)
Neutro Abs: 4.7 10*3/uL (ref 1.4–7.7)
Neutrophils Relative %: 59.2 % (ref 43.0–77.0)
Platelets: 345 10*3/uL (ref 150.0–400.0)
RBC: 4.92 Mil/uL (ref 3.87–5.11)
RDW: 13.8 % (ref 11.5–15.5)
WBC: 8 10*3/uL (ref 4.0–10.5)

## 2023-01-02 LAB — COMPREHENSIVE METABOLIC PANEL
ALT: 30 U/L (ref 0–35)
AST: 19 U/L (ref 0–37)
Albumin: 4.5 g/dL (ref 3.5–5.2)
Alkaline Phosphatase: 78 U/L (ref 39–117)
BUN: 27 mg/dL — ABNORMAL HIGH (ref 6–23)
CO2: 24 mEq/L (ref 19–32)
Calcium: 9.8 mg/dL (ref 8.4–10.5)
Chloride: 106 mEq/L (ref 96–112)
Creatinine, Ser: 1.05 mg/dL (ref 0.40–1.20)
GFR: 58.68 mL/min — ABNORMAL LOW (ref >60.00)
Glucose, Bld: 81 mg/dL (ref 70–99)
Potassium: 3.8 mEq/L (ref 3.5–5.1)
Sodium: 141 mEq/L (ref 135–145)
Total Bilirubin: 0.3 mg/dL (ref 0.2–1.2)
Total Protein: 7 g/dL (ref 6.0–8.3)

## 2023-01-02 LAB — HEMOGLOBIN A1C: Hgb A1c MFr Bld: 5.7 % (ref 4.6–6.5)

## 2023-01-02 LAB — LIPID PANEL
Cholesterol: 102 mg/dL (ref 0–200)
HDL: 45.6 mg/dL (ref >39.00)
LDL Cholesterol: 17 mg/dL (ref 0–99)
NonHDL: 56.25
Total CHOL/HDL Ratio: 2
Triglycerides: 195 mg/dL — ABNORMAL HIGH (ref 0.0–149.0)
VLDL: 39 mg/dL (ref 0.0–40.0)

## 2023-01-02 LAB — TSH: TSH: 0.64 u[IU]/mL (ref 0.35–5.50)

## 2023-01-03 ENCOUNTER — Other Ambulatory Visit: Payer: Self-pay

## 2023-01-03 DIAGNOSIS — E785 Hyperlipidemia, unspecified: Secondary | ICD-10-CM

## 2023-01-07 ENCOUNTER — Other Ambulatory Visit (HOSPITAL_COMMUNITY): Payer: Self-pay

## 2023-01-07 ENCOUNTER — Encounter: Payer: Self-pay | Admitting: Family Medicine

## 2023-01-07 ENCOUNTER — Other Ambulatory Visit: Payer: Self-pay | Admitting: Family Medicine

## 2023-01-07 MED ORDER — TIRZEPATIDE 15 MG/0.5ML ~~LOC~~ SOAJ
15.0000 mg | SUBCUTANEOUS | 4 refills | Status: DC
  Filled 2023-01-07: qty 2, 28d supply, fill #0
  Filled 2023-01-31: qty 2, 28d supply, fill #1
  Filled 2023-02-26: qty 2, 28d supply, fill #2
  Filled 2023-04-02: qty 2, 28d supply, fill #3
  Filled 2023-05-03: qty 2, 28d supply, fill #4
  Filled 2023-05-24 – 2023-05-27 (×2): qty 2, 28d supply, fill #5
  Filled 2023-06-17 – 2023-07-04 (×2): qty 2, 28d supply, fill #6
  Filled 2023-08-01: qty 2, 28d supply, fill #7
  Filled 2023-08-27: qty 2, 28d supply, fill #8
  Filled 2023-09-16 – 2023-09-19 (×2): qty 2, 28d supply, fill #9
  Filled 2023-10-14: qty 2, 28d supply, fill #10
  Filled 2023-11-04 – 2023-11-06 (×2): qty 2, 28d supply, fill #11
  Filled 2023-12-23: qty 2, 28d supply, fill #12

## 2023-01-08 ENCOUNTER — Other Ambulatory Visit: Payer: Self-pay

## 2023-01-08 ENCOUNTER — Other Ambulatory Visit (HOSPITAL_COMMUNITY): Payer: Self-pay

## 2023-01-31 ENCOUNTER — Encounter: Payer: Self-pay | Admitting: Family Medicine

## 2023-01-31 ENCOUNTER — Other Ambulatory Visit (HOSPITAL_COMMUNITY): Payer: Self-pay

## 2023-02-01 ENCOUNTER — Other Ambulatory Visit (HOSPITAL_COMMUNITY): Payer: Self-pay

## 2023-02-01 MED ORDER — DEXCOM G7 SENSOR MISC
3 refills | Status: DC
  Filled 2023-02-01: qty 3, 30d supply, fill #0
  Filled 2023-02-26: qty 3, 30d supply, fill #1
  Filled 2023-04-02: qty 3, 30d supply, fill #2
  Filled 2023-05-03: qty 3, 30d supply, fill #3

## 2023-02-01 MED ORDER — DEXCOM G7 RECEIVER DEVI
0 refills | Status: DC
  Filled 2023-02-01: qty 1, 30d supply, fill #0
  Filled 2023-06-17: qty 1, 14d supply, fill #0

## 2023-02-13 ENCOUNTER — Other Ambulatory Visit: Payer: Self-pay | Admitting: Family Medicine

## 2023-02-13 ENCOUNTER — Other Ambulatory Visit (HOSPITAL_BASED_OUTPATIENT_CLINIC_OR_DEPARTMENT_OTHER): Payer: Self-pay

## 2023-02-13 ENCOUNTER — Other Ambulatory Visit: Payer: Self-pay

## 2023-02-13 ENCOUNTER — Other Ambulatory Visit (HOSPITAL_COMMUNITY): Payer: Self-pay

## 2023-02-13 MED ORDER — LOSARTAN POTASSIUM 100 MG PO TABS
100.0000 mg | ORAL_TABLET | Freq: Every day | ORAL | 1 refills | Status: DC
  Filled 2023-02-13: qty 90, 90d supply, fill #0
  Filled 2023-05-24: qty 90, 90d supply, fill #1

## 2023-02-13 MED ORDER — DULOXETINE HCL 30 MG PO CPEP
60.0000 mg | ORAL_CAPSULE | Freq: Every day | ORAL | 1 refills | Status: DC
  Filled 2023-02-13: qty 180, 90d supply, fill #0
  Filled 2023-05-24: qty 180, 90d supply, fill #1

## 2023-02-13 MED ORDER — HYDROCHLOROTHIAZIDE 25 MG PO TABS
25.0000 mg | ORAL_TABLET | Freq: Every day | ORAL | 1 refills | Status: DC
  Filled 2023-02-13: qty 90, 90d supply, fill #0
  Filled 2023-05-24: qty 90, 90d supply, fill #1

## 2023-02-13 MED ORDER — OMEPRAZOLE 40 MG PO CPDR
40.0000 mg | DELAYED_RELEASE_CAPSULE | Freq: Every day | ORAL | 1 refills | Status: DC
  Filled 2023-02-13: qty 90, 90d supply, fill #0
  Filled 2023-05-24: qty 90, 90d supply, fill #1

## 2023-02-13 NOTE — Telephone Encounter (Signed)
Cymbalta last filled on 08/21/22 #180 + 1 refill

## 2023-02-26 ENCOUNTER — Other Ambulatory Visit (HOSPITAL_COMMUNITY): Payer: Self-pay

## 2023-03-04 ENCOUNTER — Ambulatory Visit: Payer: Commercial Managed Care - PPO | Admitting: Dietician

## 2023-03-15 ENCOUNTER — Other Ambulatory Visit (HOSPITAL_COMMUNITY): Payer: Self-pay

## 2023-03-15 ENCOUNTER — Other Ambulatory Visit: Payer: Self-pay | Admitting: Family Medicine

## 2023-03-18 ENCOUNTER — Encounter: Payer: Self-pay | Admitting: Family Medicine

## 2023-03-18 ENCOUNTER — Other Ambulatory Visit (HOSPITAL_COMMUNITY): Payer: Self-pay

## 2023-03-18 MED ORDER — POTASSIUM CHLORIDE CRYS ER 20 MEQ PO TBCR
EXTENDED_RELEASE_TABLET | ORAL | 1 refills | Status: DC
  Filled 2023-03-18: qty 114, 89d supply, fill #0
  Filled 2023-04-02: qty 114, 90d supply, fill #0
  Filled 2023-07-04 (×3): qty 114, 90d supply, fill #1

## 2023-03-18 MED ORDER — ATORVASTATIN CALCIUM 20 MG PO TABS
20.0000 mg | ORAL_TABLET | Freq: Every day | ORAL | 1 refills | Status: DC
  Filled 2023-03-18 – 2023-04-02 (×2): qty 90, 90d supply, fill #0
  Filled 2023-07-04: qty 90, 90d supply, fill #1

## 2023-03-29 ENCOUNTER — Other Ambulatory Visit (HOSPITAL_COMMUNITY): Payer: Self-pay

## 2023-04-02 ENCOUNTER — Other Ambulatory Visit: Payer: Self-pay | Admitting: Family Medicine

## 2023-04-02 ENCOUNTER — Other Ambulatory Visit (HOSPITAL_COMMUNITY): Payer: Self-pay

## 2023-04-02 ENCOUNTER — Other Ambulatory Visit: Payer: Self-pay

## 2023-04-02 MED ORDER — TRETINOIN 0.025 % EX CREA
TOPICAL_CREAM | Freq: Every day | CUTANEOUS | 1 refills | Status: DC
  Filled 2023-04-02 (×2): qty 135, 90d supply, fill #0
  Filled 2023-05-27 – 2023-06-17 (×2): qty 135, 90d supply, fill #1

## 2023-04-02 MED FILL — Albuterol Sulfate Inhal Aero 108 MCG/ACT (90MCG Base Equiv): RESPIRATORY_TRACT | 25 days supply | Qty: 6.7 | Fill #1 | Status: AC

## 2023-04-03 ENCOUNTER — Other Ambulatory Visit (HOSPITAL_COMMUNITY): Payer: Self-pay

## 2023-04-08 ENCOUNTER — Other Ambulatory Visit (HOSPITAL_COMMUNITY): Payer: Self-pay

## 2023-04-08 ENCOUNTER — Other Ambulatory Visit: Payer: Self-pay | Admitting: Family Medicine

## 2023-04-08 ENCOUNTER — Other Ambulatory Visit: Payer: Self-pay | Admitting: Gastroenterology

## 2023-04-08 MED ORDER — HYOSCYAMINE SULFATE 0.125 MG PO TBDP
0.1250 mg | ORAL_TABLET | Freq: Four times a day (QID) | ORAL | 2 refills | Status: AC | PRN
  Filled 2023-04-08: qty 60, 15d supply, fill #0

## 2023-04-09 ENCOUNTER — Other Ambulatory Visit (HOSPITAL_COMMUNITY): Payer: Self-pay

## 2023-04-12 ENCOUNTER — Other Ambulatory Visit (HOSPITAL_COMMUNITY): Payer: Self-pay

## 2023-05-03 ENCOUNTER — Other Ambulatory Visit (HOSPITAL_COMMUNITY): Payer: Self-pay

## 2023-05-03 ENCOUNTER — Other Ambulatory Visit: Payer: Self-pay | Admitting: Medical

## 2023-05-03 MED ORDER — FLUTICASONE PROPIONATE 50 MCG/ACT NA SUSP
2.0000 | Freq: Every day | NASAL | 5 refills | Status: DC
  Filled 2023-05-03: qty 16, 30d supply, fill #0
  Filled 2023-06-10: qty 16, 30d supply, fill #1
  Filled 2023-08-01: qty 16, 30d supply, fill #2
  Filled 2023-08-27: qty 16, 30d supply, fill #3
  Filled 2023-09-26: qty 16, 30d supply, fill #4
  Filled 2023-11-01: qty 16, 30d supply, fill #5

## 2023-05-23 DIAGNOSIS — D164 Benign neoplasm of bones of skull and face: Secondary | ICD-10-CM | POA: Diagnosis not present

## 2023-05-24 ENCOUNTER — Other Ambulatory Visit (HOSPITAL_COMMUNITY): Payer: Self-pay

## 2023-05-24 ENCOUNTER — Other Ambulatory Visit: Payer: Self-pay | Admitting: Family Medicine

## 2023-05-24 DIAGNOSIS — E119 Type 2 diabetes mellitus without complications: Secondary | ICD-10-CM

## 2023-05-24 MED ORDER — SERTRALINE HCL 50 MG PO TABS
50.0000 mg | ORAL_TABLET | Freq: Every day | ORAL | 0 refills | Status: DC
  Filled 2023-05-24: qty 90, 90d supply, fill #0

## 2023-05-24 MED ORDER — TOPIRAMATE 50 MG PO TABS
50.0000 mg | ORAL_TABLET | Freq: Every day | ORAL | 0 refills | Status: DC
  Filled 2023-05-24: qty 90, 90d supply, fill #0

## 2023-05-24 MED ORDER — DAPAGLIFLOZIN PROPANEDIOL 10 MG PO TABS
10.0000 mg | ORAL_TABLET | Freq: Every day | ORAL | 1 refills | Status: DC
  Filled 2023-05-24: qty 90, 90d supply, fill #0
  Filled 2023-09-16: qty 90, 90d supply, fill #1
  Filled 2023-09-17: qty 30, 30d supply, fill #1
  Filled 2023-11-01: qty 30, 30d supply, fill #2
  Filled 2023-12-23: qty 30, 30d supply, fill #3

## 2023-05-24 MED ORDER — CARVEDILOL 25 MG PO TABS
25.0000 mg | ORAL_TABLET | Freq: Two times a day (BID) | ORAL | 0 refills | Status: DC
  Filled 2023-05-24: qty 180, 90d supply, fill #0

## 2023-05-24 MED ORDER — AMLODIPINE BESYLATE 5 MG PO TABS
5.0000 mg | ORAL_TABLET | Freq: Every day | ORAL | 0 refills | Status: DC
  Filled 2023-05-24: qty 90, 90d supply, fill #0

## 2023-05-24 MED FILL — Albuterol Sulfate Inhal Aero 108 MCG/ACT (90MCG Base Equiv): RESPIRATORY_TRACT | 25 days supply | Qty: 6.7 | Fill #2 | Status: AC

## 2023-05-25 ENCOUNTER — Other Ambulatory Visit (HOSPITAL_COMMUNITY): Payer: Self-pay

## 2023-05-27 ENCOUNTER — Other Ambulatory Visit (HOSPITAL_COMMUNITY): Payer: Self-pay

## 2023-05-28 ENCOUNTER — Other Ambulatory Visit (HOSPITAL_COMMUNITY): Payer: Self-pay

## 2023-05-29 ENCOUNTER — Other Ambulatory Visit (HOSPITAL_COMMUNITY): Payer: Self-pay

## 2023-06-04 ENCOUNTER — Telehealth: Payer: Self-pay | Admitting: Dietician

## 2023-06-04 NOTE — Telephone Encounter (Signed)
Called patient per request for appointment.  Appointment made for 07/31/2022.  Oran Rein, RD, LDN, CDCES

## 2023-06-07 ENCOUNTER — Other Ambulatory Visit (HOSPITAL_COMMUNITY): Payer: Self-pay

## 2023-06-10 ENCOUNTER — Other Ambulatory Visit (HOSPITAL_COMMUNITY): Payer: Self-pay

## 2023-06-10 ENCOUNTER — Other Ambulatory Visit: Payer: Self-pay | Admitting: Family Medicine

## 2023-06-10 ENCOUNTER — Other Ambulatory Visit: Payer: Self-pay

## 2023-06-10 MED ORDER — DEXCOM G7 SENSOR MISC
3 refills | Status: DC
  Filled 2023-06-10: qty 3, 30d supply, fill #0
  Filled 2023-07-08: qty 3, 30d supply, fill #1
  Filled 2023-08-01 (×2): qty 3, 30d supply, fill #2
  Filled 2023-09-16: qty 3, 30d supply, fill #3

## 2023-06-10 MED ORDER — GABAPENTIN 300 MG PO CAPS
300.0000 mg | ORAL_CAPSULE | Freq: Every day | ORAL | 1 refills | Status: DC
Start: 2023-06-10 — End: 2023-07-02
  Filled 2023-06-10: qty 90, 90d supply, fill #0

## 2023-06-17 ENCOUNTER — Other Ambulatory Visit (HOSPITAL_COMMUNITY): Payer: Self-pay

## 2023-06-17 ENCOUNTER — Other Ambulatory Visit: Payer: Self-pay

## 2023-06-18 ENCOUNTER — Other Ambulatory Visit: Payer: Self-pay

## 2023-06-18 ENCOUNTER — Other Ambulatory Visit (HOSPITAL_COMMUNITY): Payer: Self-pay

## 2023-06-24 ENCOUNTER — Ambulatory Visit: Payer: Commercial Managed Care - PPO | Admitting: Family Medicine

## 2023-07-01 NOTE — Assessment & Plan Note (Signed)
 hgba1c acceptable, minimize simple carbs. Increase exercise as tolerated. Continue current meds

## 2023-07-01 NOTE — Assessment & Plan Note (Signed)
 Evaluated by Neurology

## 2023-07-01 NOTE — Assessment & Plan Note (Signed)
 Encourage heart healthy diet such as MIND or DASH diet, increase exercise, avoid trans fats, simple carbohydrates and processed foods, consider a krill or fish or flaxseed oil cap daily. Tolerating Atorvastatin

## 2023-07-01 NOTE — Assessment & Plan Note (Signed)
 Hydrate and monitor

## 2023-07-02 ENCOUNTER — Other Ambulatory Visit (HOSPITAL_COMMUNITY): Payer: Self-pay

## 2023-07-02 ENCOUNTER — Telehealth (INDEPENDENT_AMBULATORY_CARE_PROVIDER_SITE_OTHER): Payer: Commercial Managed Care - PPO | Admitting: Family Medicine

## 2023-07-02 VITALS — BP 121/89 | HR 69 | Temp 97.6°F

## 2023-07-02 DIAGNOSIS — E1165 Type 2 diabetes mellitus with hyperglycemia: Secondary | ICD-10-CM

## 2023-07-02 DIAGNOSIS — N289 Disorder of kidney and ureter, unspecified: Secondary | ICD-10-CM

## 2023-07-02 DIAGNOSIS — E785 Hyperlipidemia, unspecified: Secondary | ICD-10-CM | POA: Diagnosis not present

## 2023-07-02 DIAGNOSIS — M952 Other acquired deformity of head: Secondary | ICD-10-CM | POA: Diagnosis not present

## 2023-07-02 MED ORDER — GABAPENTIN 300 MG PO CAPS
300.0000 mg | ORAL_CAPSULE | Freq: Three times a day (TID) | ORAL | 1 refills | Status: DC
  Filled 2023-07-02 – 2023-07-08 (×3): qty 270, 90d supply, fill #0
  Filled 2023-09-16: qty 270, 90d supply, fill #1

## 2023-07-03 ENCOUNTER — Encounter: Payer: Self-pay | Admitting: Family Medicine

## 2023-07-03 NOTE — Progress Notes (Signed)
 MyChart Video Visit    Virtual Visit via Video Note   This patient is at least at moderate risk for complications without adequate follow up. This format is felt to be most appropriate for this patient at this time. Physical exam was limited by quality of the video and audio technology used for the visit. Juanetta, CMA was able to get the patient set up on a video visit.  Patient location: work. Patient and provider in visit Provider location: Office  I discussed the limitations of evaluation and management by telemedicine and the availability of in person appointments. The patient expressed understanding and agreed to proceed.  Visit Date: 07/02/2023  Today's healthcare provider: Harlene Horton, MD     Subjective:    Patient ID: Monique Wiggins, female    DOB: [birth year], 58 y.o.   MRN: [id number]  Chief Complaint  Patient presents with  . Follow-up    6 month    HPI Discussed the use of AI scribe software for clinical note transcription with the patient, who gave verbal consent to proceed.  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 They report persistent back pain despite nightly gabapentin . The pain is significant enough that they have been taking an additional dose of gabapentin  in the middle of the day, which has been effective in easing the pain. However, this has led to a depletion of their gabapentin  supply. They deny any sedative effects from the gabapentin .  The patient also reports that they have been eating less and eating well. They have had a follow-up with a neurosurgeon regarding the bony growth in their skull, which has not grown back since it was removed. They are satisfied with the outcome and have no further questions about it.        Past Medical History:  Diagnosis Date  . Allergy   . Arthritis   . Back pain   . Carpal tunnel syndrome   . Diabetes  mellitus without complication (HCC)   . DVT (deep venous thrombosis) (HCC)    after long car trip  . Dyspnea   . Gastroparesis   . GERD (gastroesophageal reflux disease)   . H. pylori infection    1997  . Heart murmur    ECHO 03/29/21  . Hypertension   . Hypertriglyceridemia 02/22/2017  . Hypertriglyceridemia 02/22/2017  . Migraines   . Sickle cell trait (HCC)   . Sleep apnea    states she no longer has OSA lost weight    Past Surgical History:  Procedure Laterality Date  . ABDOMINAL HYSTERECTOMY     ovaries left in place  . CARPAL TUNNEL RELEASE Right 01/19/2019   Procedure: ENDOSCOPIC CARPAL TUNNEL RELEASE;  Surgeon: Sebastian Lenis, MD;  Location: [facility];  Service: Orthopedics;  Laterality: Right;  . CARPOMETACARPEL SUSPENSION PLASTY Right 01/19/2019   Procedure: RIGHT THUMB SUSPENSION ARTHROPLASTY;  Surgeon: Sebastian Lenis, MD;  Location: [facility];  Service: Orthopedics;  Laterality: Right;  PREOP BLOCK  . CATARACT EXTRACTION Bilateral   . CHOLECYSTECTOMY  01/24/2021  . CRANIOTOMY Right 10/19/2022   Procedure: RIGHT POST auricular osteoma resection;  Surgeon: Gillie Duncans, MD;  Location[facility];  Service: Neurosurgery;  Laterality: Right;  RM 20  . TOE SURGERY     left great toe, repair of severed tendon  . TONSILLECTOMY    . tummy tuck  Family History  Problem Relation Age of Onset  . Other Mother        nasal polyp  . Diabetes type II Mother   . High blood pressure Mother   . Diabetes Mellitus II Father   . Pulmonary fibrosis Father   . Hypertension Father   . Pulmonary disease Father   . High Cholesterol Father   . Hypertension Sister   . Diabetes Sister   . Allergic Disorder Son   . Hyperthyroidism Son   . Allergic Disorder Son   . Asthma Son   . Stomach cancer Maternal Grandmother        possibility that this was colon cancer instead-patient and her mother have differing recollection  . Cancer Maternal Grandfather         lung, smoker  . Esophageal cancer Neg Hx   . Pancreatic cancer Neg Hx   . Colon cancer Neg Hx     Social History   Socioeconomic History  . Marital status: Married    Spouse name: Seena  . Number of children: 5  . Years of education: Not on file  . Highest education level: Not on file  Occupational History  . Occupation: Patient Care Tech [employer]  Tobacco Use  . Smoking status: Never  . Smokeless tobacco: Never  Vaping Use  . Vaping status: Never Used  Substance and Sexual Activity  . Alcohol use: Not Currently  . Drug use: No  . Sexual activity: Yes    Birth control/protection: Surgical  Other Topics Concern  . 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 albuterol  (VENTOLIN  HFA) 108 (90 Base) MCG/ACT inhaler Inhale 2 puffs into the lungs every 6 (six) hours as needed for wheezing/shortness of breath. 6.7 g 5  . aluminum -magnesium  hydroxide-simethicone  (MAALOX) 200-200-20 MG/5ML SUSP Take 30 mLs by mouth 3 (three) times daily with meals as needed. 355 mL 0  . amLODipine  (NORVASC ) 5 MG tablet Take 1 tablet (5 mg total) by mouth daily. 90 tablet 0  . Ascorbic Acid (VITAMIN C PO) Take 1 capsule by mouth daily.    SABRA aspirin EC 81 MG tablet Take 81 mg by mouth daily. Swallow  whole.    . atorvastatin  (LIPITOR) 20 MG tablet Take 1 tablet (20 mg total) by mouth daily. 90 tablet 1  . benzonatate  (TESSALON ) 100 MG capsule Take 1 capsule (100 mg total) by mouth 3 (three) times daily as needed for cough. 30 capsule 0  . Blood Glucose Monitoring Suppl (FREESTYLE LITE) w/Device KIT Use as directed to test blood sugar once a day 1 kit 0  . Budeson-Glycopyrrol-Formoterol (BREZTRI  AEROSPHERE) 160-9-4.8 MCG/ACT AERO Inhale 2  puffs into the lungs in the morning and at bedtime. (Patient taking differently: Inhale 2 puffs into the lungs 2 (two) times daily as needed (shortness of breath).) 10.7 g 0  . carvedilol  (COREG ) 25 MG tablet Take 1 tablet (25 mg total) by mouth 2 (two) times daily with a meal. 180 tablet 0  . cetirizine  (ZYRTEC ) 10 MG tablet Take 1 tablet (10 mg total) by mouth daily. 90 tablet 3  . chlorhexidine  (PERIDEX ) 0.12 % solution 15 mLs 2 (two) times daily.    . cholecalciferol (VITAMIN D3) 25 MCG (1000 UNIT) tablet Take 1,000 Units by mouth daily.    . Continuous Glucose Receiver (DEXCOM G7 RECEIVER) DEVI Check blood sugars as directed 1 each 0  . Continuous Glucose Sensor (DEXCOM G7 SENSOR) MISC Check blood sugars continuously as directed, changing the sensor every 10 days. 3 each 3  . Continuous Glucose Transmitter (DEXCOM G6 TRANSMITTER) MISC Check blood sugars as directed 1 each 0  . Cyanocobalamin (B-12 SL) Place 1 tablet under the tongue daily.    . dapagliflozin  propanediol (FARXIGA ) 10 MG TABS tablet Take 1 tablet (10 mg total) by mouth daily. 90 tablet 1  . dicyclomine  (BENTYL ) 20 MG tablet Take 1 tablet (20 mg total) by mouth 3 (three) times daily as needed. 20 tablet 0  . Digestive Enzymes (PAPAYA AND ENZYMES PO) Take 1 tablet by mouth daily.    . DULoxetine  (CYMBALTA ) 30 MG capsule Take 2 capsules (60 mg total) by mouth daily. 180 capsule 1  . fluticasone  (FLONASE ) 50 MCG/ACT nasal spray Place 2 sprays into both nostrils daily as needed for allergies. 48 g  1  . fluticasone  (FLONASE ) 50 MCG/ACT nasal spray Place 2 sprays into both nostrils daily. 16 g 5  . fluticasone -salmeterol (ADVAIR  DISKUS) 500-50 MCG/ACT AEPB Inhale 1 puff into the lungs in the morning and at bedtime. (Patient taking differently: Inhale 1 puff into the lungs daily.) 60 each 3  . glipiZIDE  (GLUCOTROL ) 5 MG tablet Take 1/2 tablet (2.5 mg total) by mouth daily before breakfast. 45 tablet 3  . glucose blood test strip Use as directed to test blood sugar daily in the afternoon 100 each 3  . hydrochlorothiazide  (HYDRODIURIL ) 25 MG tablet Take 1 tablet (25 mg total) by mouth daily. 90 tablet 1  . HYDROcodone -acetaminophen  (NORCO/VICODIN) 5-325 MG tablet Take 1 tablet by mouth every 6 (six) hours as needed for moderate pain 30 tablet 0  . hyoscyamine  (ANASPAZ ) 0.125 MG TBDP disintergrating tablet Place 1 tablet (0.125 mg total) under the tongue every 6 (six) hours as needed for cramping. 60 tablet 2  . Lancets (FREESTYLE) lancets Use as directed to test once a day 100 each 3  . linaclotide  (LINZESS ) 145 MCG CAPS capsule Take 1 capsule (145 mcg total) by mouth daily before breakfast. 30 capsule 2  . loperamide  (IMODIUM ) 2 MG capsule Take 1 capsule (2 mg total) by mouth 4 (four) times daily as needed for diarrhea or loose stools. 12 capsule 0  . losartan  (COZAAR ) 100 MG tablet Take 1 tablet (100 mg total) by mouth daily. Take with HCTZ 90 tablet 1  . MAGNESIUM  PO Take 1 tablet by mouth daily.    . metFORMIN  (GLUCOPHAGE ) 500 MG tablet Take 1 tablet (500 mg total) by mouth daily with breakfast. 90 tablet 3  . methylPREDNISolone  (MEDROL ) 4 MG TBPK tablet Take as directed on 6 day dose pack. 21 tablet 0  . mometasone  (ELOCON ) 0.1 % cream Apply to affected area daily. (  Patient taking differently: Apply 1 Application topically daily as needed (irritation).) 135 g 1  . Multiple Vitamin (MULTIVITAMIN WITH MINERALS) TABS tablet Take 1 tablet by mouth daily.    . Multiple Vitamins-Minerals (HAIR SKIN  & NAILS) TABS Take 1 tablet by mouth daily.    . Multiple Vitamins-Minerals (ZINC  PO) Take 1 tablet by mouth daily.    . Omega-3 Fatty Acids (FISH OIL PO) Take 1 capsule by mouth daily.    . omeprazole  (PRILOSEC) 40 MG capsule Take 1 capsule (40 mg total) by mouth daily. 90 capsule 1  . ondansetron  (ZOFRAN -ODT) 4 MG disintegrating tablet Dissolve 1 tablet (4 mg total) by mouth every 8 (eight) hours as needed. 20 tablet 0  . Polyvinyl Alcohol-Povidone (CLEAR EYES NATURAL TEARS OP) Place 1 drop into both eyes daily as needed (dry eyes).    . potassium chloride  SA (KLOR-CON  M) 20 MEQ tablet TAKE TWO TABLETS BY MOUTH ON TUESDAY AND SATURDAY AND ONE TABLET ALL OTHER DAYS. 114 tablet 1  . psyllium (REGULOID) 0.52 g capsule Take 0.52 g by mouth daily.    . sertraline  (ZOLOFT ) 50 MG tablet Take 1 tablet (50 mg total) by mouth at bedtime. 90 tablet 0  . tirzepatide  (MOUNJARO ) 15 MG/0.5ML Pen Inject 15 mg into the skin once a week. 6 mL 4  . topiramate  (TOPAMAX ) 50 MG tablet Take 1 tablet (50 mg total) by mouth daily. 90 tablet 0  . tretinoin  (RETIN-A ) 0.025 % cream Apply topically at bedtime. 135 g 1  . gabapentin  (NEURONTIN ) 300 MG capsule Take 1 capsule (300 mg) by mouth at bedtime. 90 capsule 1   No facility-administered medications prior to visit.    Allergies  Allergen Reactions  . Pollen Extract Swelling  . Shellfish Allergy Swelling    Review of Systems  Constitutional:  Negative for fever and malaise/fatigue.  HENT:  Negative for congestion.   Eyes:  Negative for blurred vision.  Respiratory:  Negative for shortness of breath.   Cardiovascular:  Negative for chest pain, palpitations and leg swelling.  Gastrointestinal:  Negative for abdominal pain, blood in stool and nausea.  Genitourinary:  Negative for dysuria and frequency.  Musculoskeletal:  Positive for back pain. Negative for falls.  Skin:  Negative for rash.  Neurological:  Negative for dizziness, loss of consciousness and  headaches.  Endo/Heme/Allergies:  Negative for environmental allergies.  Psychiatric/Behavioral:  Negative for depression. The patient is not nervous/anxious.        Objective:    Physical Exam Constitutional:      General: She is not in acute distress.    Appearance: Normal appearance. She is not ill-appearing or toxic-appearing.  HENT:     Head: Normocephalic and atraumatic.     Right Ear: External ear normal.     Left Ear: External ear normal.     Nose: Nose normal.  Eyes:     General:        Right eye: No discharge.        Left eye: No discharge.  Pulmonary:     Effort: Pulmonary effort is normal.  Skin:    Findings: No rash.  Neurological:     Mental Status: She is alert and oriented to person, place, and time.  Psychiatric:        Behavior: Behavior normal.    BP 121/89   Pulse 69   Temp 97.6 F (36.4 C) (Temporal)   SpO2 99%  Wt Readings from Last 3 Encounters:  01/01/23 154  lb 9.6 oz (70.1 kg)  11/29/22 158 lb (71.7 kg)  10/19/22 160 lb (72.6 kg)       Assessment & Plan:  Hyperlipidemia, unspecified hyperlipidemia type Assessment & Plan: Encourage heart healthy diet such as MIND or DASH diet, increase exercise, avoid trans fats, simple carbohydrates and processed foods, consider a krill or fish or flaxseed oil cap daily. Tolerating Atorvastatin   Orders: -     Lipid panel; Future -     CBC with Differential/Platelet; Future -     TSH; Future  Renal insufficiency Assessment & Plan: Hydrate and monitor   Orders: -     Comprehensive metabolic panel; Future -     CBC with Differential/Platelet; Future -     TSH; Future  Type 2 diabetes mellitus with hyperglycemia, without long-term current use of insulin  [facility]) Assessment & Plan: hgba1c acceptable, minimize simple carbs. Increase exercise as tolerated. Continue current meds   Orders: -     CBC with Differential/Platelet; Future -     Hemoglobin A1c; Future -     TSH; Future  Skull  deformity Assessment & Plan: Evaluated by Neurology   Other orders -     Gabapentin ; Take 1 capsule (300 mg total) by mouth 3 (three) times daily.  Dispense: 270 capsule; Refill: 1     Assessment and Plan    Chronic Back Pain Persistent despite nightly Gabapentin  300mg . No excessive sedation reported with daytime use. -Increase Gabapentin  to 300mg  TID as needed for pain control.  Reactive Bone Growth Post-surgical follow-up indicates no recurrence of growth. No current concerns. -Continue current management.  General Health Maintenance -Order routine blood work to monitor sugar, kidney function, thyroid , and other basic parameters. To be done at [facility] office at patient's convenience. -Discuss potential for annual COVID-19 vaccination and Respiratory Syncytial Virus (RSV) vaccination. Decision pending insurance coverage and patient preference. -Consider pelvic exam in the next year for routine screening, despite cessation of Pap smears. -Schedule follow-up appointment in 6 months.         I discussed the assessment and treatment plan with the patient. The patient was provided an opportunity to ask questions and all were answered. The patient agreed with the plan and demonstrated an understanding of the instructions.   The patient was advised to call back or seek an in-person evaluation if the symptoms worsen or if the condition fails to improve as anticipated.  Harlene Horton, MD [facility] Primary Care at [facility] [phone] (phone) [phone] (fax)  [facility]

## 2023-07-04 ENCOUNTER — Other Ambulatory Visit (HOSPITAL_COMMUNITY): Payer: Self-pay

## 2023-07-04 ENCOUNTER — Ambulatory Visit: Payer: Commercial Managed Care - PPO | Admitting: Family Medicine

## 2023-07-04 ENCOUNTER — Other Ambulatory Visit: Payer: Self-pay

## 2023-07-05 ENCOUNTER — Other Ambulatory Visit (HOSPITAL_COMMUNITY): Payer: Self-pay

## 2023-07-08 ENCOUNTER — Other Ambulatory Visit (HOSPITAL_COMMUNITY): Payer: Self-pay

## 2023-07-08 ENCOUNTER — Other Ambulatory Visit: Payer: Self-pay | Admitting: Family Medicine

## 2023-07-08 MED ORDER — DEXCOM G7 RECEIVER DEVI
0 refills | Status: AC
  Filled 2023-07-08: qty 1, 90d supply, fill #0
  Filled 2023-11-29: qty 1, 30d supply, fill #0

## 2023-07-09 ENCOUNTER — Encounter: Payer: Self-pay | Admitting: Family Medicine

## 2023-07-09 ENCOUNTER — Other Ambulatory Visit (INDEPENDENT_AMBULATORY_CARE_PROVIDER_SITE_OTHER): Payer: Commercial Managed Care - PPO

## 2023-07-09 ENCOUNTER — Other Ambulatory Visit (HOSPITAL_COMMUNITY): Payer: Self-pay

## 2023-07-09 DIAGNOSIS — E785 Hyperlipidemia, unspecified: Secondary | ICD-10-CM | POA: Diagnosis not present

## 2023-07-09 DIAGNOSIS — E1165 Type 2 diabetes mellitus with hyperglycemia: Secondary | ICD-10-CM | POA: Diagnosis not present

## 2023-07-09 DIAGNOSIS — N289 Disorder of kidney and ureter, unspecified: Secondary | ICD-10-CM | POA: Diagnosis not present

## 2023-07-09 LAB — CBC WITH DIFFERENTIAL/PLATELET
Basophils Absolute: 0.1 10*3/uL (ref 0.0–0.1)
Basophils Relative: 1 % (ref 0.0–3.0)
Eosinophils Absolute: 0.2 10*3/uL (ref 0.0–0.7)
Eosinophils Relative: 2.6 % (ref 0.0–5.0)
HCT: 46.6 % — ABNORMAL HIGH (ref 36.0–46.0)
Hemoglobin: 15.1 g/dL — ABNORMAL HIGH (ref 12.0–15.0)
Lymphocytes Relative: 29.6 % (ref 12.0–46.0)
Lymphs Abs: 2 10*3/uL (ref 0.7–4.0)
MCHC: 32.4 g/dL (ref 30.0–36.0)
MCV: 85.9 fL (ref 78.0–100.0)
Monocytes Absolute: 0.4 10*3/uL (ref 0.1–1.0)
Monocytes Relative: 5.5 % (ref 3.0–12.0)
Neutro Abs: 4 10*3/uL (ref 1.4–7.7)
Neutrophils Relative %: 61.3 % (ref 43.0–77.0)
Platelets: 346 10*3/uL (ref 150.0–400.0)
RBC: 5.43 Mil/uL — ABNORMAL HIGH (ref 3.87–5.11)
RDW: 13.8 % (ref 11.5–15.5)
WBC: 6.6 10*3/uL (ref 4.0–10.5)

## 2023-07-09 LAB — COMPREHENSIVE METABOLIC PANEL
ALT: 31 U/L (ref 0–35)
AST: 22 U/L (ref 0–37)
Albumin: 4.8 g/dL (ref 3.5–5.2)
Alkaline Phosphatase: 90 U/L (ref 39–117)
BUN: 20 mg/dL (ref 6–23)
CO2: 28 meq/L (ref 19–32)
Calcium: 9.5 mg/dL (ref 8.4–10.5)
Chloride: 106 meq/L (ref 96–112)
Creatinine, Ser: 1.26 mg/dL — ABNORMAL HIGH (ref 0.40–1.20)
GFR: 46.98 mL/min — ABNORMAL LOW (ref >60.00)
Glucose, Bld: 90 mg/dL (ref 70–99)
Potassium: 3.5 meq/L (ref 3.5–5.1)
Sodium: 144 meq/L (ref 135–145)
Total Bilirubin: 0.4 mg/dL (ref 0.2–1.2)
Total Protein: 7.6 g/dL (ref 6.0–8.3)

## 2023-07-09 LAB — LIPID PANEL
Cholesterol: 168 mg/dL (ref 0–200)
HDL: 61.3 mg/dL (ref >39.00)
LDL Cholesterol: 71 mg/dL (ref 0–99)
NonHDL: 106.35
Total CHOL/HDL Ratio: 3
Triglycerides: 178 mg/dL — ABNORMAL HIGH (ref 0.0–149.0)
VLDL: 35.6 mg/dL (ref 0.0–40.0)

## 2023-07-09 LAB — HEMOGLOBIN A1C: Hgb A1c MFr Bld: 6 % (ref 4.6–6.5)

## 2023-07-09 LAB — TSH: TSH: 1.55 u[IU]/mL (ref 0.35–5.50)

## 2023-07-12 ENCOUNTER — Other Ambulatory Visit: Payer: Self-pay | Admitting: Emergency Medicine

## 2023-07-12 DIAGNOSIS — E1165 Type 2 diabetes mellitus with hyperglycemia: Secondary | ICD-10-CM

## 2023-07-12 DIAGNOSIS — N289 Disorder of kidney and ureter, unspecified: Secondary | ICD-10-CM

## 2023-08-01 ENCOUNTER — Other Ambulatory Visit (HOSPITAL_COMMUNITY): Payer: Self-pay

## 2023-08-01 ENCOUNTER — Encounter: Payer: Commercial Managed Care - PPO | Attending: Family Medicine | Admitting: Dietician

## 2023-08-01 DIAGNOSIS — E119 Type 2 diabetes mellitus without complications: Secondary | ICD-10-CM | POA: Insufficient documentation

## 2023-08-01 NOTE — Patient Instructions (Signed)
Drink plenty of water - this can help with dry mouth Continue to be mindful about your meal choices.  Consider more plant based meal. Find ways to be active again.

## 2023-08-01 NOTE — Progress Notes (Unsigned)
Diabetes Self-Management Education  Visit Type: Follow-up  Appt. Start Time: 0415 Appt. End Time: 0445  08/02/2023  [name], identified by name and date of birth, is a 58 y.o. female with a diagnosis of Diabetes:  .   ASSESSMENT Patient is here today alone.  She was last seen by this RD on 11/29/2022.  1st of 3 employee visits for 2025 He has not been exercising as she is working and her husband has just had 2 hip surgeries and a foot surgery. States that overall she still feels very well.  Back hurts some. She has back exercises.  She is concerned about her kidneys. Now following the Mediterranean diet.   She went from animal based protein to plant based protein for shakes and eats some meat.   She has lost 7 lbs in the past 8 months.   History includes Type 2 diabetes, nausea after gallbladder removed 02/2021, HTN, HLD, OSA (dx years ago and no longer uses c-pap) Poor sleep (3 hours per night), wakes frequently to use the restroom Back pain A1C  6% 07/09/2023 increased from 5.7% 01/01/2023 decreased from 7.5%, cholesterol 106, HDL 45, Triglycerides 234 0/9811, GFR 46 07/09/2023 Medications include:  Farxiga, Mounjaro, glipizide, Vitamin B-12, vitamin D3, omega 3, zinc, vitamin C, magnesium, papaya enzymes, hair skin and nails, psyllium husk caps, potassium Dexcom G7    CGM Results from download: 07/29/2022 11/29/2022 08/01/2023  % Time CGM active:   93 %   (Goal >70%) 36   Average glucose:   109 mg/dL for 14 days 914 X 14 days 103x14  Glucose management indicator:   5.9 %     Time in range (70-180 mg/dL):   99 %   (Goal >78%) 99 98  Time High (181-250 mg/dL):   0 %   (Goal < 29%) 0 <1  Time Very High (>250 mg/dL):    0 %   (Goal < 5%) 0 0  Time Low (54-69 mg/dL):   <1 %   (Goal <5%) <1 1  Time Very Low (<54 mg/dL):   <1 %   (Goal <6%) 0 <1    Weight hx: 151 lbs 08/01/2023 158 lbs 11/29/2022 171 lbs 08/23/2022 183 lbs 07/08/2022 190 lbs 05/24/2022 - lost weight but regained  with Thanksgiving (9 sweet potato pies) 189 lbs 03/06/2022 183 lbs 01/2022 193 lbs 07/20/2021 192 lbs 05/18/2021 155 lbs lowest adult weight 2018 195 lbs highest adult weight  10/17/2017 Goal weight 145 (when muscle mass was good but "may be different now")   Patient lives with her husband and mother.  They share shopping and cooking.  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  She is a Environmental manager (babies and dogs). Allergic to shellfish, flax seeds (N/V/D), most nuts cause discomfort and distress Lactose intolerant Doesn't tolerate raw onions Her husband is very supportive but enjoys feeding her and likes to snack at night. - States that she now understands. Increased stress this year.   Diabetes Self-Management Education - 08/02/23 1159       Visit Information   Visit Type Follow-up      Psychosocial Assessment   Patient Belief/Attitude about Diabetes Motivated to manage diabetes    What is the hardest part about your diabetes right now, causing you the most concern, or is the most worrisome to you about your diabetes?   Being active    Self-care barriers None    Self-management support Doctor's office;CDE visits  Other persons present Patient    Patient Concerns Nutrition/Meal planning;Glycemic Control;Weight Control;Healthy Lifestyle    Special Needs None    Preferred Learning Style No preference indicated    Learning Readiness Ready    How often do you need to have someone help you when you read instructions, pamphlets, or other written materials from your doctor or pharmacy? 1 - Never      Pre-Education Assessment   Patient understands the diabetes disease and treatment process. Demonstrates understanding / competency    Patient understands incorporating nutritional management into lifestyle. Comprehends key points    Patient undertands incorporating physical activity into lifestyle. Demonstrates understanding / competency    Patient  understands using medications safely. Demonstrates understanding / competency    Patient understands monitoring blood glucose, interpreting and using results Demonstrates understanding / competency    Patient understands prevention, detection, and treatment of chronic complications. Demonstrates understanding / competency    Patient understands how to develop strategies to address psychosocial issues. Demonstrates understanding / competency    Patient understands how to develop strategies to promote health/change behavior. Comprehends key points      Complications   Last HgB A1C per patient/outside source 6 %   07/09/23 and 5.7% 01/01/2023   How often do you check your blood sugar? > 4 times/day    Fasting Blood glucose range (mg/dL) 40-981    Postprandial Blood glucose range (mg/dL) 19-147;829-562    Number of hypoglycemic episodes per month 1      Dietary Intake   Breakfast usually protein shakes but today 1 banana, ginger tea with agave    Snack (morning) none    Lunch cabbage, brown rice, beef    Snack (afternoon) none    Dinner cabbage, brown rice, beef    Snack (evening) none    Beverage(s) Water, ginger tea with agave      Activity / Exercise   Activity / Exercise Type ADL's      Patient Education   Previous Diabetes Education Yes (please comment)   11/2022   Healthy Eating Meal options for control of blood glucose level and chronic complications.    Being Active Role of exercise on diabetes management, blood pressure control and cardiac health.    Medications Reviewed patients medication for diabetes, action, purpose, timing of dose and side effects.    Monitoring Taught/evaluated CGM (comment)    Diabetes Stress and Support Identified and addressed patients feelings and concerns about diabetes;Worked with patient to identify barriers to care and solutions      Individualized Goals (developed by patient)   Nutrition General guidelines for healthy choices and portions discussed     Physical Activity Exercise 5-7 days per week    Medications take my medication as prescribed    Monitoring  Consistenly use CGM    Problem Solving Addressing barriers to behavior change    Reducing Risk examine blood glucose patterns;do foot checks daily;treat hypoglycemia with 15 grams of carbs if blood glucose less than 70mg /dL      Patient Self-Evaluation of Goals - Patient rates self as meeting previously set goals (% of time)   Nutrition >75% (most of the time)    Physical Activity < 25% (hardly ever/never)    Medications >75% (most of the time)    Monitoring >75% (most of the time)    Problem Solving and behavior change strategies  >75% (most of the time)    Reducing Risk (treating acute and chronic complications) >75% (most of the  time)    Health Coping >75% (most of the time)      Post-Education Assessment   Patient understands the diabetes disease and treatment process. Demonstrates understanding / competency    Patient understands incorporating nutritional management into lifestyle. Demonstrates understanding / competency    Patient undertands incorporating physical activity into lifestyle. Demonstrates understanding / competency    Patient understands using medications safely. Demonstrates understanding / competency    Patient understands monitoring blood glucose, interpreting and using results Demonstrates understanding / competency    Patient understands prevention, detection, and treatment of acute complications. Demonstrates understanding / competency    Patient understands prevention, detection, and treatment of chronic complications. Demonstrates understanding / competency    Patient understands how to develop strategies to address psychosocial issues. Demonstrates understanding / competency    Patient understands how to develop strategies to promote health/change behavior. Comprehends key points      Outcomes   Expected Outcomes Demonstrated interest in learning.  Expect positive outcomes    Future DMSE 3-4 months    Program Status Not Completed      Subsequent Visit   Since your last visit have you experienced any weight changes? Loss    Weight Loss (lbs) 9             Individualized Plan for Diabetes Self-Management Training:   Learning Objective:  Patient will have a greater understanding of diabetes self-management. Patient education plan is to attend individual and/or group sessions per assessed needs and concerns.   Plan:   Patient Instructions  Drink plenty of water - this can help with dry mouth Continue to be mindful about your meal choices.  Consider more plant based meal. Find ways to be active again.     Expected Outcomes:  Demonstrated interest in learning. Expect positive outcomes  Education material provided:   If problems or questions, patient to contact team via:  Phone  Future DSME appointment: 3-4 months

## 2023-08-27 ENCOUNTER — Other Ambulatory Visit (HOSPITAL_COMMUNITY): Payer: Self-pay

## 2023-09-16 ENCOUNTER — Other Ambulatory Visit: Payer: Self-pay | Admitting: Family Medicine

## 2023-09-16 ENCOUNTER — Other Ambulatory Visit (HOSPITAL_COMMUNITY): Payer: Self-pay

## 2023-09-17 ENCOUNTER — Other Ambulatory Visit: Payer: Self-pay

## 2023-09-17 ENCOUNTER — Other Ambulatory Visit (HOSPITAL_COMMUNITY): Payer: Self-pay

## 2023-09-17 DIAGNOSIS — H5202 Hypermetropia, left eye: Secondary | ICD-10-CM | POA: Diagnosis not present

## 2023-09-17 DIAGNOSIS — H52223 Regular astigmatism, bilateral: Secondary | ICD-10-CM | POA: Diagnosis not present

## 2023-09-17 MED ORDER — TOPIRAMATE 50 MG PO TABS
50.0000 mg | ORAL_TABLET | Freq: Every day | ORAL | 1 refills | Status: DC
  Filled 2023-09-17: qty 90, 90d supply, fill #0
  Filled 2023-12-23: qty 90, 90d supply, fill #1

## 2023-09-17 MED ORDER — CETIRIZINE HCL 10 MG PO TABS
10.0000 mg | ORAL_TABLET | Freq: Every day | ORAL | 1 refills | Status: DC
  Filled 2023-09-17: qty 90, 90d supply, fill #0
  Filled 2023-12-23: qty 90, 90d supply, fill #1

## 2023-09-17 MED ORDER — SERTRALINE HCL 50 MG PO TABS
50.0000 mg | ORAL_TABLET | Freq: Every day | ORAL | 1 refills | Status: DC
  Filled 2023-09-17: qty 90, 90d supply, fill #0

## 2023-09-17 MED ORDER — AMLODIPINE BESYLATE 5 MG PO TABS
5.0000 mg | ORAL_TABLET | Freq: Every day | ORAL | 1 refills | Status: DC
  Filled 2023-09-17: qty 90, 90d supply, fill #0
  Filled 2023-12-23: qty 90, 90d supply, fill #1

## 2023-09-17 MED ORDER — HYDROCHLOROTHIAZIDE 25 MG PO TABS
25.0000 mg | ORAL_TABLET | Freq: Every day | ORAL | 1 refills | Status: DC
  Filled 2023-09-17: qty 90, 90d supply, fill #0
  Filled 2023-12-23: qty 90, 90d supply, fill #1

## 2023-09-17 MED ORDER — GLIPIZIDE 5 MG PO TABS
2.5000 mg | ORAL_TABLET | Freq: Every day | ORAL | 1 refills | Status: DC
  Filled 2023-09-17: qty 45, 90d supply, fill #0
  Filled 2023-12-23: qty 45, 90d supply, fill #1

## 2023-09-17 MED ORDER — ATORVASTATIN CALCIUM 20 MG PO TABS
20.0000 mg | ORAL_TABLET | Freq: Every day | ORAL | 1 refills | Status: AC
  Filled 2023-09-17: qty 90, 90d supply, fill #0
  Filled 2024-02-16: qty 90, 90d supply, fill #1

## 2023-09-17 MED ORDER — DULOXETINE HCL 30 MG PO CPEP
60.0000 mg | ORAL_CAPSULE | Freq: Every day | ORAL | 1 refills | Status: DC
  Filled 2023-09-17: qty 180, 90d supply, fill #0
  Filled 2023-12-23: qty 180, 90d supply, fill #1

## 2023-09-17 MED ORDER — LOSARTAN POTASSIUM 100 MG PO TABS
100.0000 mg | ORAL_TABLET | Freq: Every day | ORAL | 1 refills | Status: DC
  Filled 2023-09-17: qty 90, 90d supply, fill #0
  Filled 2023-12-23: qty 90, 90d supply, fill #1

## 2023-09-17 MED ORDER — CARVEDILOL 25 MG PO TABS
25.0000 mg | ORAL_TABLET | Freq: Two times a day (BID) | ORAL | 1 refills | Status: DC
  Filled 2023-09-17: qty 180, 90d supply, fill #0
  Filled 2023-12-23: qty 180, 90d supply, fill #1

## 2023-09-17 MED ORDER — OMEPRAZOLE 40 MG PO CPDR
40.0000 mg | DELAYED_RELEASE_CAPSULE | Freq: Every day | ORAL | 1 refills | Status: DC
  Filled 2023-09-17: qty 90, 90d supply, fill #0
  Filled 2023-12-23: qty 90, 90d supply, fill #1

## 2023-09-17 MED ORDER — POTASSIUM CHLORIDE CRYS ER 20 MEQ PO TBCR
EXTENDED_RELEASE_TABLET | ORAL | 1 refills | Status: DC
  Filled 2023-09-17: qty 114, 90d supply, fill #0
  Filled 2023-11-29 (×2): qty 114, 90d supply, fill #1

## 2023-09-26 ENCOUNTER — Other Ambulatory Visit: Payer: Self-pay | Admitting: Family Medicine

## 2023-09-26 ENCOUNTER — Other Ambulatory Visit (HOSPITAL_COMMUNITY): Payer: Self-pay

## 2023-09-26 MED ORDER — TRETINOIN 0.025 % EX CREA
TOPICAL_CREAM | Freq: Every day | CUTANEOUS | 1 refills | Status: AC
  Filled 2023-09-26: qty 135, 90d supply, fill #0
  Filled 2023-10-09: qty 135, 60d supply, fill #0
  Filled 2023-11-29: qty 45, 30d supply, fill #1
  Filled 2024-02-16: qty 45, 30d supply, fill #2
  Filled 2024-04-28: qty 45, 30d supply, fill #3

## 2023-09-26 MED ORDER — MOMETASONE FUROATE 0.1 % EX CREA
1.0000 | TOPICAL_CREAM | Freq: Every day | CUTANEOUS | 1 refills | Status: AC
  Filled 2023-09-26: qty 135, 90d supply, fill #0
  Filled 2023-11-18: qty 45, 10d supply, fill #0
  Filled 2023-11-18: qty 90, 20d supply, fill #0
  Filled 2023-12-23: qty 135, 30d supply, fill #1

## 2023-09-27 ENCOUNTER — Other Ambulatory Visit (HOSPITAL_COMMUNITY): Payer: Self-pay

## 2023-09-28 ENCOUNTER — Other Ambulatory Visit (HOSPITAL_COMMUNITY): Payer: Self-pay

## 2023-09-30 ENCOUNTER — Other Ambulatory Visit (HOSPITAL_COMMUNITY): Payer: Self-pay

## 2023-10-07 ENCOUNTER — Other Ambulatory Visit (HOSPITAL_COMMUNITY): Payer: Self-pay

## 2023-10-09 ENCOUNTER — Other Ambulatory Visit (HOSPITAL_COMMUNITY): Payer: Self-pay

## 2023-10-14 ENCOUNTER — Other Ambulatory Visit (HOSPITAL_COMMUNITY): Payer: Self-pay

## 2023-10-14 ENCOUNTER — Other Ambulatory Visit: Payer: Self-pay | Admitting: Family Medicine

## 2023-10-14 MED ORDER — DEXCOM G7 SENSOR MISC
1 refills | Status: DC
Start: 2023-10-14 — End: 2024-01-27
  Filled 2023-10-14: qty 3, 30d supply, fill #0
  Filled 2023-11-29: qty 1, 10d supply, fill #1
  Filled 2023-12-02: qty 1, 10d supply, fill #2
  Filled 2023-12-05 (×2): qty 3, 30d supply, fill #1
  Filled 2023-12-05: qty 1, 10d supply, fill #2

## 2023-11-01 ENCOUNTER — Other Ambulatory Visit (HOSPITAL_COMMUNITY): Payer: Self-pay

## 2023-11-04 ENCOUNTER — Other Ambulatory Visit (HOSPITAL_COMMUNITY): Payer: Self-pay

## 2023-11-05 ENCOUNTER — Other Ambulatory Visit (HOSPITAL_COMMUNITY): Payer: Self-pay

## 2023-11-06 ENCOUNTER — Other Ambulatory Visit (HOSPITAL_COMMUNITY): Payer: Self-pay

## 2023-11-07 ENCOUNTER — Other Ambulatory Visit: Payer: Self-pay

## 2023-11-07 ENCOUNTER — Ambulatory Visit: Payer: Commercial Managed Care - PPO | Admitting: Dietician

## 2023-11-07 ENCOUNTER — Telehealth: Payer: Self-pay

## 2023-11-07 ENCOUNTER — Other Ambulatory Visit (HOSPITAL_COMMUNITY): Payer: Self-pay

## 2023-11-07 ENCOUNTER — Ambulatory Visit: Admitting: Dietician

## 2023-11-07 NOTE — Telephone Encounter (Signed)
 Pharmacy Patient Advocate Encounter   Received notification from Patient Pharmacy that prior authorization for Mounjaro  15 is required/requested.   Insurance verification completed.   The patient is insured through [facility] .   Per test claim: PA required; PA submitted to above mentioned insurance via CoverMyMeds Key/confirmation #/EOC [id number] Status is pending

## 2023-11-07 NOTE — Telephone Encounter (Signed)
 Pharmacy Patient Advocate Encounter  Received notification from [employer] that Prior Authorization for Mounjaro  15 has been APPROVED from 11/07/23 to 11/06/24. Ran test claim, Copay is $25.00. This test claim was processed through [facility]- copay amounts may vary at other pharmacies due to pharmacy/plan contracts, or as the patient moves through the different stages of their insurance plan.   PA #/Case ID/Reference #: [id number]

## 2023-11-07 NOTE — Telephone Encounter (Signed)
 Left a detailed vm advising patient mounjaro  15 mg/ 0.5 ML was approved w/ a $25 copay.

## 2023-11-12 ENCOUNTER — Other Ambulatory Visit: Payer: Self-pay

## 2023-11-18 ENCOUNTER — Other Ambulatory Visit: Payer: Self-pay

## 2023-11-18 ENCOUNTER — Other Ambulatory Visit (HOSPITAL_COMMUNITY): Payer: Self-pay

## 2023-11-29 ENCOUNTER — Other Ambulatory Visit (HOSPITAL_COMMUNITY): Payer: Self-pay

## 2023-11-30 ENCOUNTER — Other Ambulatory Visit (HOSPITAL_COMMUNITY): Payer: Self-pay

## 2023-12-02 ENCOUNTER — Other Ambulatory Visit (HOSPITAL_COMMUNITY): Payer: Self-pay

## 2023-12-05 ENCOUNTER — Other Ambulatory Visit (HOSPITAL_COMMUNITY): Payer: Self-pay

## 2023-12-19 ENCOUNTER — Other Ambulatory Visit (HOSPITAL_BASED_OUTPATIENT_CLINIC_OR_DEPARTMENT_OTHER): Payer: Self-pay

## 2023-12-19 ENCOUNTER — Other Ambulatory Visit (HOSPITAL_COMMUNITY): Payer: Self-pay

## 2023-12-19 DIAGNOSIS — F429 Obsessive-compulsive disorder, unspecified: Secondary | ICD-10-CM | POA: Diagnosis not present

## 2023-12-19 DIAGNOSIS — F902 Attention-deficit hyperactivity disorder, combined type: Secondary | ICD-10-CM | POA: Diagnosis not present

## 2023-12-19 DIAGNOSIS — F419 Anxiety disorder, unspecified: Secondary | ICD-10-CM | POA: Diagnosis not present

## 2023-12-19 MED ORDER — SERTRALINE HCL 100 MG PO TABS
100.0000 mg | ORAL_TABLET | Freq: Every day | ORAL | 1 refills | Status: DC
  Filled 2023-12-19: qty 30, 30d supply, fill #0

## 2023-12-23 ENCOUNTER — Encounter: Payer: Self-pay | Admitting: Family Medicine

## 2023-12-23 ENCOUNTER — Other Ambulatory Visit (HOSPITAL_COMMUNITY): Payer: Self-pay

## 2023-12-23 ENCOUNTER — Other Ambulatory Visit: Payer: Self-pay | Admitting: Family Medicine

## 2023-12-23 MED ORDER — FLUTICASONE PROPIONATE 50 MCG/ACT NA SUSP
2.0000 | Freq: Every day | NASAL | 5 refills | Status: AC
  Filled 2023-12-23: qty 16, 30d supply, fill #0
  Filled 2024-02-16: qty 16, 30d supply, fill #1
  Filled 2024-04-28: qty 16, 30d supply, fill #2

## 2023-12-24 ENCOUNTER — Other Ambulatory Visit (HOSPITAL_COMMUNITY): Payer: Self-pay

## 2023-12-25 ENCOUNTER — Other Ambulatory Visit (HOSPITAL_COMMUNITY): Payer: Self-pay

## 2023-12-26 ENCOUNTER — Encounter: Payer: Self-pay | Admitting: Neurology

## 2023-12-26 ENCOUNTER — Other Ambulatory Visit: Payer: Self-pay

## 2023-12-26 ENCOUNTER — Other Ambulatory Visit (HOSPITAL_COMMUNITY): Payer: Self-pay

## 2023-12-26 ENCOUNTER — Ambulatory Visit (INDEPENDENT_AMBULATORY_CARE_PROVIDER_SITE_OTHER): Admitting: Family Medicine

## 2023-12-26 ENCOUNTER — Encounter: Payer: Self-pay | Admitting: Family Medicine

## 2023-12-26 VITALS — BP 102/67 | HR 66 | Ht 61.0 in | Wt 151.0 lb

## 2023-12-26 DIAGNOSIS — N289 Disorder of kidney and ureter, unspecified: Secondary | ICD-10-CM

## 2023-12-26 DIAGNOSIS — R41840 Attention and concentration deficit: Secondary | ICD-10-CM

## 2023-12-26 DIAGNOSIS — E1165 Type 2 diabetes mellitus with hyperglycemia: Secondary | ICD-10-CM

## 2023-12-26 NOTE — Progress Notes (Signed)
 Acute Office Visit  Subjective:     Patient ID: Monique Wiggins, female    DOB: Aug 29, 1965, 58 y.o.   MRN: [id number]  Chief Complaint  Patient presents with   Medical Management of Chronic Issues    HPI Patient is in today for EKG request from psych provider.    Discussed the use of AI scribe software for clinical note transcription with the patient, who gave verbal consent to proceed.  History of Present Illness Monique Wiggins is a 58 year old female who presents for an EKG prior to medication prescription for ADHD.  She has been diagnosed with ADHD and OCD and is seeking an EKG as part of the workup required before starting medication for ADHD. No chest pain, shortness of breath, unusual headaches, or weight loss. Appetite is normal.  She is currently on multiple medications including albuterol  inhaler as needed, Maalox as needed, amlodipine  5 mg once a day, vitamin C, aspirin, atorvastatin  20 mg once a day, Breztreat inhaler, carvedilol  25 mg twice a day, Zyrtec  once a day, D3 1000 units once a day, Farxiga  10 mg a day, Bentyl  as needed, Cymbalta  60 mg a day, Flonase , Advair  twice a day, gabapentin  300 mg three times a day, hydrocortisone 25 mg once a day, hyoscyamine  as needed, Linzess  as needed, losartan  100 mg a day, magnesium , omeprazole  40 mg a day, Zofran  as needed, potassium two tablets on Tuesdays and Saturdays, psyllium, Zoloft  100 mg once a day, Mounjaro  15 mg weekly, Topamax  50 mg a day, and tretinoin  cream. She has stopped taking metformin  and glyburide due to low blood sugar episodes.          ROS All review of systems negative except what is listed in the HPI      Objective:    BP 102/67   Pulse 66   Ht 5' 1 (1.549 m)   Wt 151 lb (68.5 kg)   SpO2 97%   BMI 28.53 kg/m    Physical Exam Vitals reviewed.  Constitutional:      Appearance: Normal appearance.  Cardiovascular:     Rate and Rhythm: Normal rate and regular rhythm.     Heart  sounds: Normal heart sounds.  Pulmonary:     Effort: Pulmonary effort is normal.     Breath sounds: Normal breath sounds.  Skin:    General: Skin is warm and dry.  Neurological:     Mental Status: She is alert and oriented to person, place, and time.  Psychiatric:        Mood and Affect: Mood normal.        Behavior: Behavior normal.        Thought Content: Thought content normal.        Judgment: Judgment normal.    EKG SR 70 bpm, no ST elevation, stable compared to last EKG 2023  No results found for any visits on 12/26/23.      Assessment & Plan:   Problem List Items Addressed This Visit       Active Problems   Type 2 diabetes mellitus with hyperglycemia, without long-term current use of insulin  (HCC)   Renal insufficiency   Other Visit Diagnoses       Difficulty concentrating    -  Primary   Relevant Orders   EKG 12-Lead       Assessment & Plan Attention-Deficit/Hyperactivity Disorder (ADHD) ADHD diagnosed by psychiatrist. EKG stable. Psychiatrist concerned about stimulant effects on blood pressure. - Reviewed EKG  results with PCP. Okay for psych to manage stimulant if needed - recommend close monitoring of vitals and adverse effects.  - Letter/EKG faxed to psych Juris Pinal PA)  Hypertension Blood pressure and heart rate well-controlled on current regimen. - Continue current antihypertensive regimen.       No orders of the defined types were placed in this encounter.   Return if symptoms worsen or fail to improve.  Waddell KATHEE Mon, NP

## 2023-12-27 ENCOUNTER — Ambulatory Visit: Payer: Self-pay | Admitting: Family Medicine

## 2023-12-27 LAB — CBC WITH DIFFERENTIAL/PLATELET
Basophils Absolute: 0.1 K/uL (ref 0.0–0.1)
Basophils Relative: 1 % (ref 0.0–3.0)
Eosinophils Absolute: 0.2 K/uL (ref 0.0–0.7)
Eosinophils Relative: 3.1 % (ref 0.0–5.0)
HCT: 39.8 % (ref 36.0–46.0)
Hemoglobin: 13.2 g/dL (ref 12.0–15.0)
Lymphocytes Relative: 29.4 % (ref 12.0–46.0)
Lymphs Abs: 2.2 K/uL (ref 0.7–4.0)
MCHC: 33.2 g/dL (ref 30.0–36.0)
MCV: 84.4 fl (ref 78.0–100.0)
Monocytes Absolute: 0.4 K/uL (ref 0.1–1.0)
Monocytes Relative: 5.9 % (ref 3.0–12.0)
Neutro Abs: 4.5 K/uL (ref 1.4–7.7)
Neutrophils Relative %: 60.6 % (ref 43.0–77.0)
Platelets: 371 K/uL (ref 150.0–400.0)
RBC: 4.72 Mil/uL (ref 3.87–5.11)
RDW: 13.5 % (ref 11.5–15.5)
WBC: 7.4 K/uL (ref 4.0–10.5)

## 2023-12-27 LAB — COMPREHENSIVE METABOLIC PANEL WITH GFR
ALT: 35 U/L (ref 0–35)
AST: 23 U/L (ref 0–37)
Albumin: 4.4 g/dL (ref 3.5–5.2)
Alkaline Phosphatase: 83 U/L (ref 39–117)
BUN: 20 mg/dL (ref 6–23)
CO2: 26 meq/L (ref 19–32)
Calcium: 9.4 mg/dL (ref 8.4–10.5)
Chloride: 107 meq/L (ref 96–112)
Creatinine, Ser: 1.31 mg/dL — ABNORMAL HIGH (ref 0.40–1.20)
GFR: 44.69 mL/min — ABNORMAL LOW (ref >60.00)
Glucose, Bld: 80 mg/dL (ref 70–99)
Potassium: 4 meq/L (ref 3.5–5.1)
Sodium: 141 meq/L (ref 135–145)
Total Bilirubin: 0.3 mg/dL (ref 0.2–1.2)
Total Protein: 6.8 g/dL (ref 6.0–8.3)

## 2023-12-27 NOTE — Progress Notes (Signed)
 Called patient and no answer. Mailbox full and could not leave a message.

## 2023-12-28 ENCOUNTER — Other Ambulatory Visit (HOSPITAL_COMMUNITY): Payer: Self-pay

## 2024-01-23 ENCOUNTER — Other Ambulatory Visit (HOSPITAL_COMMUNITY): Payer: Self-pay

## 2024-01-23 DIAGNOSIS — F902 Attention-deficit hyperactivity disorder, combined type: Secondary | ICD-10-CM | POA: Diagnosis not present

## 2024-01-23 DIAGNOSIS — F429 Obsessive-compulsive disorder, unspecified: Secondary | ICD-10-CM | POA: Diagnosis not present

## 2024-01-23 DIAGNOSIS — F419 Anxiety disorder, unspecified: Secondary | ICD-10-CM | POA: Diagnosis not present

## 2024-01-23 MED ORDER — ATOMOXETINE HCL 25 MG PO CAPS
25.0000 mg | ORAL_CAPSULE | Freq: Every day | ORAL | 0 refills | Status: DC
  Filled 2024-01-23: qty 42, 30d supply, fill #0

## 2024-01-23 MED ORDER — SERTRALINE HCL 100 MG PO TABS
100.0000 mg | ORAL_TABLET | Freq: Every day | ORAL | 1 refills | Status: DC
  Filled 2024-01-23: qty 30, 30d supply, fill #0

## 2024-01-24 ENCOUNTER — Other Ambulatory Visit: Payer: Self-pay | Admitting: Family Medicine

## 2024-01-24 ENCOUNTER — Other Ambulatory Visit (HOSPITAL_COMMUNITY): Payer: Self-pay

## 2024-01-24 MED ORDER — MOUNJARO 15 MG/0.5ML ~~LOC~~ SOAJ
15.0000 mg | SUBCUTANEOUS | 4 refills | Status: AC
  Filled 2024-01-24: qty 6, 84d supply, fill #0
  Filled 2024-04-07: qty 6, 84d supply, fill #1
  Filled 2024-06-16 – 2024-06-17 (×2): qty 6, 84d supply, fill #2

## 2024-01-25 ENCOUNTER — Other Ambulatory Visit (HOSPITAL_COMMUNITY): Payer: Self-pay

## 2024-01-27 ENCOUNTER — Other Ambulatory Visit (HOSPITAL_COMMUNITY): Payer: Self-pay

## 2024-01-27 ENCOUNTER — Other Ambulatory Visit: Payer: Self-pay | Admitting: Family Medicine

## 2024-01-27 MED ORDER — DEXCOM G7 SENSOR MISC
1 refills | Status: DC
  Filled 2024-01-27: qty 3, 30d supply, fill #0
  Filled 2024-03-04: qty 3, 30d supply, fill #1

## 2024-02-13 ENCOUNTER — Encounter: Payer: Self-pay | Admitting: Family Medicine

## 2024-02-13 DIAGNOSIS — E1165 Type 2 diabetes mellitus with hyperglycemia: Secondary | ICD-10-CM

## 2024-02-16 ENCOUNTER — Other Ambulatory Visit: Payer: Self-pay | Admitting: Family Medicine

## 2024-02-16 ENCOUNTER — Other Ambulatory Visit (HOSPITAL_COMMUNITY): Payer: Self-pay

## 2024-02-18 ENCOUNTER — Other Ambulatory Visit: Payer: Self-pay

## 2024-02-18 ENCOUNTER — Other Ambulatory Visit (HOSPITAL_COMMUNITY): Payer: Self-pay

## 2024-02-18 MED ORDER — ATOMOXETINE HCL 25 MG PO CAPS
50.0000 mg | ORAL_CAPSULE | Freq: Every day | ORAL | 1 refills | Status: AC
  Filled 2024-02-18: qty 60, 30d supply, fill #0
  Filled 2024-04-28: qty 60, 30d supply, fill #1

## 2024-02-18 MED ORDER — GABAPENTIN 300 MG PO CAPS
300.0000 mg | ORAL_CAPSULE | Freq: Three times a day (TID) | ORAL | 1 refills | Status: AC
  Filled 2024-02-18: qty 270, 90d supply, fill #0

## 2024-02-24 NOTE — Assessment & Plan Note (Deleted)
 Hydrate and monitor

## 2024-02-24 NOTE — Assessment & Plan Note (Deleted)
 Encouraged DASH or MIND diet, decrease po intake and increase exercise as tolerated. Needs 7-8 hours of sleep nightly. Avoid trans fats, eat small, frequent meals every 4-5 hours with lean proteins, complex carbs and healthy fats. Minimize simple carbs, high fat foods and processed foods

## 2024-02-24 NOTE — Assessment & Plan Note (Deleted)
Avoid offending foods, start probiotics. Do not eat large meals in late evening and consider raising head of bed. Omeprazole is helpful.

## 2024-02-24 NOTE — Assessment & Plan Note (Deleted)
 Patient encouraged to maintain heart healthy diet, regular exercise, adequate sleep. Consider daily probiotics. Take medications as prescribed. Labs ordered and reviewed. Given and reviewed copy of ACP documents from [facility] Secretary of State and encouraged to complete and return,last [facility] 08/2022 will order repeat. . Pap 2020 repeat pelvic 2025 Colonoscopy 2018 repeat in 2028. Recommend baseline Dexa after 60

## 2024-02-24 NOTE — Assessment & Plan Note (Deleted)
 Encourage heart healthy diet such as MIND or DASH diet, increase exercise, avoid trans fats, simple carbohydrates and processed foods, consider a krill or fish or flaxseed oil cap daily. Tolerating Atorvastatin

## 2024-02-24 NOTE — Assessment & Plan Note (Deleted)
 hgba1c acceptable, minimize simple carbs. Increase exercise as tolerated. Continue current meds

## 2024-02-25 ENCOUNTER — Other Ambulatory Visit (HOSPITAL_COMMUNITY): Payer: Self-pay

## 2024-02-27 ENCOUNTER — Other Ambulatory Visit (HOSPITAL_COMMUNITY): Payer: Self-pay

## 2024-02-27 ENCOUNTER — Encounter: Payer: Commercial Managed Care - PPO | Admitting: Family Medicine

## 2024-02-27 DIAGNOSIS — Z0000 Encounter for general adult medical examination without abnormal findings: Secondary | ICD-10-CM

## 2024-02-27 DIAGNOSIS — F419 Anxiety disorder, unspecified: Secondary | ICD-10-CM | POA: Diagnosis not present

## 2024-02-27 DIAGNOSIS — F902 Attention-deficit hyperactivity disorder, combined type: Secondary | ICD-10-CM | POA: Diagnosis not present

## 2024-02-27 DIAGNOSIS — E1165 Type 2 diabetes mellitus with hyperglycemia: Secondary | ICD-10-CM

## 2024-02-27 DIAGNOSIS — F429 Obsessive-compulsive disorder, unspecified: Secondary | ICD-10-CM | POA: Diagnosis not present

## 2024-02-27 DIAGNOSIS — E669 Obesity, unspecified: Secondary | ICD-10-CM

## 2024-02-27 DIAGNOSIS — K219 Gastro-esophageal reflux disease without esophagitis: Secondary | ICD-10-CM

## 2024-02-27 DIAGNOSIS — N289 Disorder of kidney and ureter, unspecified: Secondary | ICD-10-CM

## 2024-02-27 DIAGNOSIS — E785 Hyperlipidemia, unspecified: Secondary | ICD-10-CM

## 2024-02-27 MED ORDER — AMPHETAMINE-DEXTROAMPHETAMINE 5 MG PO TABS
5.0000 mg | ORAL_TABLET | Freq: Every morning | ORAL | 0 refills | Status: AC
  Filled 2024-02-27: qty 7, 7d supply, fill #0

## 2024-02-27 MED ORDER — SERTRALINE HCL 100 MG PO TABS
100.0000 mg | ORAL_TABLET | Freq: Every day | ORAL | 1 refills | Status: DC
  Filled 2024-02-27: qty 30, 30d supply, fill #0
  Filled 2024-04-28: qty 30, 30d supply, fill #1

## 2024-02-28 ENCOUNTER — Other Ambulatory Visit (HOSPITAL_COMMUNITY): Payer: Self-pay

## 2024-03-04 ENCOUNTER — Other Ambulatory Visit (HOSPITAL_COMMUNITY): Payer: Self-pay

## 2024-03-05 ENCOUNTER — Other Ambulatory Visit (HOSPITAL_COMMUNITY): Payer: Self-pay

## 2024-03-05 ENCOUNTER — Other Ambulatory Visit: Payer: Self-pay

## 2024-03-05 DIAGNOSIS — F902 Attention-deficit hyperactivity disorder, combined type: Secondary | ICD-10-CM | POA: Diagnosis not present

## 2024-03-05 DIAGNOSIS — F429 Obsessive-compulsive disorder, unspecified: Secondary | ICD-10-CM | POA: Diagnosis not present

## 2024-03-05 DIAGNOSIS — F419 Anxiety disorder, unspecified: Secondary | ICD-10-CM | POA: Diagnosis not present

## 2024-03-05 MED ORDER — AMPHETAMINE-DEXTROAMPHET ER 10 MG PO CP24
10.0000 mg | ORAL_CAPSULE | Freq: Every morning | ORAL | 0 refills | Status: AC
  Filled 2024-03-05: qty 14, 14d supply, fill #0

## 2024-03-11 ENCOUNTER — Other Ambulatory Visit (HOSPITAL_COMMUNITY): Payer: Self-pay

## 2024-03-11 ENCOUNTER — Other Ambulatory Visit: Payer: Self-pay | Admitting: Family Medicine

## 2024-03-11 DIAGNOSIS — E119 Type 2 diabetes mellitus without complications: Secondary | ICD-10-CM

## 2024-03-11 MED ORDER — DAPAGLIFLOZIN PROPANEDIOL 10 MG PO TABS
10.0000 mg | ORAL_TABLET | Freq: Every day | ORAL | 0 refills | Status: DC
  Filled 2024-03-11 (×2): qty 30, 30d supply, fill #0

## 2024-03-18 ENCOUNTER — Other Ambulatory Visit (HOSPITAL_COMMUNITY): Payer: Self-pay

## 2024-03-18 DIAGNOSIS — F902 Attention-deficit hyperactivity disorder, combined type: Secondary | ICD-10-CM | POA: Diagnosis not present

## 2024-03-18 DIAGNOSIS — F419 Anxiety disorder, unspecified: Secondary | ICD-10-CM | POA: Diagnosis not present

## 2024-03-18 DIAGNOSIS — F429 Obsessive-compulsive disorder, unspecified: Secondary | ICD-10-CM | POA: Diagnosis not present

## 2024-03-18 MED ORDER — AMPHETAMINE-DEXTROAMPHET ER 15 MG PO CP24
15.0000 mg | ORAL_CAPSULE | Freq: Every morning | ORAL | 0 refills | Status: DC
  Filled 2024-03-18: qty 30, 30d supply, fill #0

## 2024-03-31 ENCOUNTER — Other Ambulatory Visit (INDEPENDENT_AMBULATORY_CARE_PROVIDER_SITE_OTHER)

## 2024-03-31 ENCOUNTER — Ambulatory Visit: Payer: Self-pay | Admitting: Family

## 2024-03-31 DIAGNOSIS — E1165 Type 2 diabetes mellitus with hyperglycemia: Secondary | ICD-10-CM

## 2024-03-31 LAB — HEMOGLOBIN A1C: Hgb A1c MFr Bld: 5.9 % (ref 4.6–6.5)

## 2024-04-07 ENCOUNTER — Other Ambulatory Visit: Payer: Self-pay | Admitting: Family Medicine

## 2024-04-07 ENCOUNTER — Other Ambulatory Visit: Payer: Self-pay

## 2024-04-07 ENCOUNTER — Other Ambulatory Visit (HOSPITAL_COMMUNITY): Payer: Self-pay

## 2024-04-07 DIAGNOSIS — E119 Type 2 diabetes mellitus without complications: Secondary | ICD-10-CM

## 2024-04-07 MED ORDER — DAPAGLIFLOZIN PROPANEDIOL 10 MG PO TABS
10.0000 mg | ORAL_TABLET | Freq: Every day | ORAL | 0 refills | Status: DC
  Filled 2024-04-07: qty 30, 30d supply, fill #0

## 2024-04-07 MED ORDER — DEXCOM G7 SENSOR MISC
1 refills | Status: AC
  Filled 2024-04-07: qty 3, 30d supply, fill #0
  Filled 2024-05-11: qty 3, 30d supply, fill #1

## 2024-04-08 ENCOUNTER — Other Ambulatory Visit (HOSPITAL_COMMUNITY): Payer: Self-pay

## 2024-04-14 ENCOUNTER — Other Ambulatory Visit (HOSPITAL_COMMUNITY): Payer: Self-pay

## 2024-04-14 MED ORDER — AMPHETAMINE-DEXTROAMPHET ER 15 MG PO CP24
15.0000 mg | ORAL_CAPSULE | Freq: Every morning | ORAL | 0 refills | Status: DC
  Filled 2024-04-14 – 2024-04-15 (×2): qty 30, 30d supply, fill #0

## 2024-04-15 ENCOUNTER — Other Ambulatory Visit (HOSPITAL_COMMUNITY): Payer: Self-pay

## 2024-04-15 ENCOUNTER — Other Ambulatory Visit: Payer: Self-pay

## 2024-04-17 ENCOUNTER — Other Ambulatory Visit (HOSPITAL_COMMUNITY): Payer: Self-pay

## 2024-04-24 ENCOUNTER — Telehealth: Payer: Self-pay | Admitting: Family Medicine

## 2024-04-24 NOTE — Telephone Encounter (Signed)
 [employer] fmla paperwork placed in s-drive from e-fax.

## 2024-04-28 ENCOUNTER — Other Ambulatory Visit: Payer: Self-pay | Admitting: Family Medicine

## 2024-04-28 ENCOUNTER — Other Ambulatory Visit (HOSPITAL_COMMUNITY): Payer: Self-pay

## 2024-04-28 MED ORDER — LOSARTAN POTASSIUM 100 MG PO TABS
100.0000 mg | ORAL_TABLET | Freq: Every day | ORAL | 1 refills | Status: AC
  Filled 2024-04-28: qty 90, 90d supply, fill #0

## 2024-04-28 MED ORDER — CETIRIZINE HCL 10 MG PO TABS
10.0000 mg | ORAL_TABLET | Freq: Every day | ORAL | 1 refills | Status: AC
  Filled 2024-04-28: qty 90, 90d supply, fill #0

## 2024-04-28 MED ORDER — AMLODIPINE BESYLATE 5 MG PO TABS
5.0000 mg | ORAL_TABLET | Freq: Every day | ORAL | 1 refills | Status: AC
  Filled 2024-04-28: qty 90, 90d supply, fill #0

## 2024-04-28 MED ORDER — TOPIRAMATE 50 MG PO TABS
50.0000 mg | ORAL_TABLET | Freq: Every day | ORAL | 1 refills | Status: AC
  Filled 2024-04-28: qty 90, 90d supply, fill #0

## 2024-04-28 MED ORDER — CARVEDILOL 25 MG PO TABS
25.0000 mg | ORAL_TABLET | Freq: Two times a day (BID) | ORAL | 1 refills | Status: AC
  Filled 2024-04-28: qty 180, 90d supply, fill #0

## 2024-04-28 MED ORDER — DULOXETINE HCL 30 MG PO CPEP
60.0000 mg | ORAL_CAPSULE | Freq: Every day | ORAL | 1 refills | Status: AC
  Filled 2024-04-28: qty 180, 90d supply, fill #0

## 2024-04-28 MED ORDER — POTASSIUM CHLORIDE CRYS ER 20 MEQ PO TBCR
EXTENDED_RELEASE_TABLET | ORAL | 1 refills | Status: AC
  Filled 2024-04-28: qty 114, 90d supply, fill #0

## 2024-04-28 MED ORDER — HYDROCHLOROTHIAZIDE 25 MG PO TABS
25.0000 mg | ORAL_TABLET | Freq: Every day | ORAL | 1 refills | Status: AC
  Filled 2024-04-28: qty 90, 90d supply, fill #0

## 2024-04-28 MED ORDER — OMEPRAZOLE 40 MG PO CPDR
40.0000 mg | DELAYED_RELEASE_CAPSULE | Freq: Every day | ORAL | 1 refills | Status: AC
  Filled 2024-04-28: qty 90, 90d supply, fill #0

## 2024-05-01 ENCOUNTER — Other Ambulatory Visit (HOSPITAL_COMMUNITY): Payer: Self-pay

## 2024-05-11 ENCOUNTER — Other Ambulatory Visit: Payer: Self-pay | Admitting: Family Medicine

## 2024-05-11 ENCOUNTER — Other Ambulatory Visit: Payer: Self-pay

## 2024-05-11 ENCOUNTER — Other Ambulatory Visit (HOSPITAL_COMMUNITY): Payer: Self-pay

## 2024-05-11 DIAGNOSIS — E119 Type 2 diabetes mellitus without complications: Secondary | ICD-10-CM

## 2024-05-11 MED ORDER — DAPAGLIFLOZIN PROPANEDIOL 10 MG PO TABS
10.0000 mg | ORAL_TABLET | Freq: Every day | ORAL | 0 refills | Status: AC
  Filled 2024-05-11 (×2): qty 90, 90d supply, fill #0
  Filled 2024-05-19: qty 30, 30d supply, fill #0
  Filled 2024-05-19: qty 90, 90d supply, fill #0
  Filled 2024-06-16 (×2): qty 30, 30d supply, fill #1

## 2024-05-13 ENCOUNTER — Other Ambulatory Visit (HOSPITAL_COMMUNITY): Payer: Self-pay

## 2024-05-13 DIAGNOSIS — F902 Attention-deficit hyperactivity disorder, combined type: Secondary | ICD-10-CM | POA: Diagnosis not present

## 2024-05-13 DIAGNOSIS — F419 Anxiety disorder, unspecified: Secondary | ICD-10-CM | POA: Diagnosis not present

## 2024-05-13 DIAGNOSIS — F429 Obsessive-compulsive disorder, unspecified: Secondary | ICD-10-CM | POA: Diagnosis not present

## 2024-05-13 MED ORDER — SERTRALINE HCL 100 MG PO TABS
150.0000 mg | ORAL_TABLET | Freq: Every day | ORAL | 1 refills | Status: AC
  Filled 2024-05-30: qty 45, 30d supply, fill #0

## 2024-05-13 MED ORDER — AMPHETAMINE-DEXTROAMPHET ER 15 MG PO CP24
15.0000 mg | ORAL_CAPSULE | ORAL | 0 refills | Status: DC
  Filled 2024-05-13: qty 30, 30d supply, fill #0

## 2024-05-19 ENCOUNTER — Other Ambulatory Visit: Payer: Self-pay

## 2024-05-19 ENCOUNTER — Other Ambulatory Visit (HOSPITAL_COMMUNITY): Payer: Self-pay

## 2024-05-21 ENCOUNTER — Encounter: Payer: Self-pay | Admitting: Family Medicine

## 2024-05-21 ENCOUNTER — Other Ambulatory Visit: Payer: Self-pay | Admitting: Family

## 2024-05-21 DIAGNOSIS — M19049 Primary osteoarthritis, unspecified hand: Secondary | ICD-10-CM

## 2024-05-28 ENCOUNTER — Other Ambulatory Visit (HOSPITAL_COMMUNITY): Payer: Self-pay

## 2024-05-30 ENCOUNTER — Other Ambulatory Visit (HOSPITAL_COMMUNITY): Payer: Self-pay

## 2024-06-02 ENCOUNTER — Telehealth: Payer: Self-pay | Admitting: Radiation Oncology

## 2024-06-02 NOTE — Telephone Encounter (Signed)
 12/15 Sent via stat fax request for md's notes of dx including any images/path reports from [facility].

## 2024-06-05 ENCOUNTER — Encounter: Payer: Self-pay | Admitting: Radiation Oncology

## 2024-06-05 ENCOUNTER — Ambulatory Visit
Admission: RE | Admit: 2024-06-05 | Discharge: 2024-06-05 | Disposition: A | Discharge status: Home / Self Care | Source: Ambulatory Visit | Attending: Radiation Oncology | Admitting: Radiation Oncology

## 2024-06-05 VITALS — BP 133/82 | HR 69 | Temp 97.7°F | Resp 18 | Ht 61.0 in | Wt 149.2 lb

## 2024-06-05 DIAGNOSIS — R011 Cardiac murmur, unspecified: Secondary | ICD-10-CM | POA: Insufficient documentation

## 2024-06-05 DIAGNOSIS — M19049 Primary osteoarthritis, unspecified hand: Secondary | ICD-10-CM

## 2024-06-05 DIAGNOSIS — M19042 Primary osteoarthritis, left hand: Secondary | ICD-10-CM | POA: Diagnosis not present

## 2024-06-05 DIAGNOSIS — Z7951 Long term (current) use of inhaled steroids: Secondary | ICD-10-CM | POA: Diagnosis not present

## 2024-06-05 DIAGNOSIS — M19072 Primary osteoarthritis, left ankle and foot: Secondary | ICD-10-CM | POA: Insufficient documentation

## 2024-06-05 DIAGNOSIS — G4730 Sleep apnea, unspecified: Secondary | ICD-10-CM | POA: Diagnosis not present

## 2024-06-05 DIAGNOSIS — K219 Gastro-esophageal reflux disease without esophagitis: Secondary | ICD-10-CM | POA: Insufficient documentation

## 2024-06-05 DIAGNOSIS — E1143 Type 2 diabetes mellitus with diabetic autonomic (poly)neuropathy: Secondary | ICD-10-CM | POA: Insufficient documentation

## 2024-06-05 DIAGNOSIS — Z800 Family history of malignant neoplasm of digestive organs: Secondary | ICD-10-CM | POA: Diagnosis not present

## 2024-06-05 DIAGNOSIS — I10 Essential (primary) hypertension: Secondary | ICD-10-CM | POA: Insufficient documentation

## 2024-06-05 DIAGNOSIS — Z7985 Long-term (current) use of injectable non-insulin antidiabetic drugs: Secondary | ICD-10-CM | POA: Insufficient documentation

## 2024-06-05 DIAGNOSIS — Z7984 Long term (current) use of oral hypoglycemic drugs: Secondary | ICD-10-CM | POA: Diagnosis not present

## 2024-06-05 DIAGNOSIS — Z79899 Other long term (current) drug therapy: Secondary | ICD-10-CM | POA: Diagnosis not present

## 2024-06-05 DIAGNOSIS — M19079 Primary osteoarthritis, unspecified ankle and foot: Secondary | ICD-10-CM | POA: Diagnosis not present

## 2024-06-05 DIAGNOSIS — E781 Pure hyperglyceridemia: Secondary | ICD-10-CM | POA: Diagnosis not present

## 2024-06-05 DIAGNOSIS — M19041 Primary osteoarthritis, right hand: Secondary | ICD-10-CM | POA: Diagnosis not present

## 2024-06-05 DIAGNOSIS — D573 Sickle-cell trait: Secondary | ICD-10-CM | POA: Diagnosis not present

## 2024-06-05 DIAGNOSIS — Z7982 Long term (current) use of aspirin: Secondary | ICD-10-CM | POA: Insufficient documentation

## 2024-06-05 DIAGNOSIS — Z801 Family history of malignant neoplasm of trachea, bronchus and lung: Secondary | ICD-10-CM | POA: Diagnosis not present

## 2024-06-05 DIAGNOSIS — Z86718 Personal history of other venous thrombosis and embolism: Secondary | ICD-10-CM | POA: Insufficient documentation

## 2024-06-05 NOTE — Progress Notes (Addendum)
 " Radiation Oncology         [phone] ________________________________  Name: Monique Wiggins        MRN: [id number]  Date of Service: 06/05/2024 DOB: April 19, 1966  RR:Aobuy, Monique LABOR, MD  Domenica Monique LABOR, MD     REFERRING PHYSICIAN: Domenica Monique LABOR, MD  DIAGNOSIS: The primary encounter diagnosis was Arthritis of hand, unspecified laterality. Diagnoses of Arthritis of right hand, Arthritis of left hand, and Arthritis of metatarsophalangeal (MTP) joint of great toe were also pertinent to this visit.   M19.041; F80.957; M19.072   HISTORY OF PRESENT ILLNESS: Monique Wiggins is a 58 y.o. female seen in consultation for radiation therapy.  She has a hx of bilateral hand arthritis. She first developed sx 2 years ago. She also has arthritis of the L metatarsophalangeal joint which she injured 10 years ago requiring tendon repair. She reports she is unable to tolerate NSAIDs and takes tylenol  for pain. She also uses heat frequently to relieve discomfort and cramping. She reports her pain is around a 4-5/10 in severity. Her pain worsens throughout the day with use. Cold weather also exacerbates her pain. She reports the pain in her foot is so bothersome that as soon as she walks into work she has to use heat to relieve the discomfort. She is not interested in steroid injections or surgical repair.   Reports she does her own hair and it used to take 8 hours to do it and now due to her pain and stiffness it can take her days.   Her sx are not responsive to standard conservative measures and are limited by her inability to tolerate effective OTC treatments and she is looking for additional relief from her sx.  PREVIOUS RADIATION THERAPY: No  AUTOIMMUNE DISEASE: No  MEDICAL DEVICES: No  PREGNANCY: No   PAST MEDICAL HISTORY:  Past Medical History:  Diagnosis Date   Allergy    Arthritis    Back pain    Carpal tunnel syndrome    Diabetes mellitus without complication (HCC)    DVT (deep  venous thrombosis) (HCC)    after long car trip   Dyspnea    Gastroparesis    GERD (gastroesophageal reflux disease)    H. pylori infection    1997   Heart murmur    ECHO 03/29/21   Hypertension    Hypertriglyceridemia 02/22/2017   Hypertriglyceridemia 02/22/2017   Migraines    Sickle cell trait    Sleep apnea    states she no longer has OSA lost weight       PAST SURGICAL HISTORY: Past Surgical History:  Procedure Laterality Date   ABDOMINAL HYSTERECTOMY     ovaries left in place   CARPAL TUNNEL RELEASE Right 01/19/2019   Procedure: ENDOSCOPIC CARPAL TUNNEL RELEASE;  Surgeon: Sebastian Lenis, MD;  Location: [facility];  Service: Orthopedics;  Laterality: Right;   CARPOMETACARPEL SUSPENSION PLASTY Right 01/19/2019   Procedure: RIGHT THUMB SUSPENSION ARTHROPLASTY;  Surgeon: Sebastian Lenis, MD;  Location: [facility];  Service: Orthopedics;  Laterality: Right;  PREOP BLOCK   CATARACT EXTRACTION Bilateral    CHOLECYSTECTOMY  01/24/2021   CRANIOTOMY Right 10/19/2022   Procedure: RIGHT POST auricular osteoma resection;  Surgeon: Gillie Duncans, MD;  Location: [facility];  Service: Neurosurgery;  Laterality: Right;  RM 20   TOE SURGERY     left great toe, repair of severed tendon   TONSILLECTOMY     tummy tuck  FAMILY HISTORY:  Family History  Problem Relation Age of Onset   Other Mother        nasal polyp   Diabetes type II Mother    High blood pressure Mother    Diabetes Mellitus II Father    Pulmonary fibrosis Father    Hypertension Father    Pulmonary disease Father    High Cholesterol Father    Hypertension Sister    Diabetes Sister    Allergic Disorder Son    Hyperthyroidism Son    Allergic Disorder Son    Asthma Son    Stomach cancer Maternal Grandmother        possibility that this was colon cancer instead-patient and her mother have differing recollection   Cancer Maternal Grandfather        lung, smoker   Esophageal cancer  Neg Hx    Pancreatic cancer Neg Hx    Colon cancer Neg Hx      SOCIAL HISTORY:  reports that she has never smoked. She has never used smokeless tobacco. She reports that she does not currently use alcohol. She reports that she does not use drugs.   ALLERGIES: Pollen extract and Shellfish allergy   MEDICATIONS:  Current Outpatient Medications  Medication Sig Dispense Refill   albuterol  (VENTOLIN  HFA) 108 (90 Base) MCG/ACT inhaler Inhale 2 puffs into the lungs every 6 (six) hours as needed for wheezing/shortness of breath. 6.7 g 5   aluminum -magnesium  hydroxide-simethicone  (MAALOX) 200-200-20 MG/5ML SUSP Take 30 mLs by mouth 3 (three) times daily with meals as needed. 355 mL 0   amLODipine  (NORVASC ) 5 MG tablet Take 1 tablet (5 mg total) by mouth daily. 90 tablet 1   amphetamine -dextroamphetamine  (ADDERALL XR) 10 MG 24 hr capsule Take 1 capsule (10 mg total) by mouth every morning. 14 capsule 0   amphetamine -dextroamphetamine  (ADDERALL XR) 15 MG 24 hr capsule Take 1 capsule by mouth every morning. 30 capsule 0   amphetamine -dextroamphetamine  (ADDERALL) 5 MG tablet Take 1 tablet (5 mg total) by mouth in the morning. 7 tablet 0   Ascorbic Acid (VITAMIN C PO) Take 1 capsule by mouth daily.     aspirin EC 81 MG tablet Take 81 mg by mouth daily. Swallow whole.     atomoxetine  (STRATTERA ) 25 MG capsule Take 2 capsules (50 mg total) by mouth daily. 60 capsule 1   atorvastatin  (LIPITOR) 20 MG tablet Take 1 tablet (20 mg total) by mouth daily. 90 tablet 1   Blood Glucose Monitoring Suppl (FREESTYLE LITE) w/Device KIT Use as directed to test blood sugar once a day 1 kit 0   Budeson-Glycopyrrol-Formoterol (BREZTRI  AEROSPHERE) 160-9-4.8 MCG/ACT AERO Inhale 2 puffs into the lungs in the morning and at bedtime. (Patient taking differently: Inhale 2 puffs into the lungs 2 (two) times daily as needed (shortness of breath).) 10.7 g 0   carvedilol  (COREG ) 25 MG tablet Take 1 tablet (25 mg total) by mouth 2  (two) times daily with a meal. 180 tablet 1   cetirizine  (ZYRTEC ) 10 MG tablet Take 1 tablet (10 mg total) by mouth daily. 90 tablet 1   cholecalciferol (VITAMIN D3) 25 MCG (1000 UNIT) tablet Take 1,000 Units by mouth daily.     Continuous Glucose Receiver (DEXCOM G7 RECEIVER) DEVI Check blood sugars as directed 1 each 0   Continuous Glucose Sensor (DEXCOM G7 SENSOR) MISC Check blood sugars continuously as directed, changing the sensor every 10 days. 3 each 1   Cyanocobalamin (B-12 SL) Place 1 tablet  under the tongue daily.     dapagliflozin  propanediol (FARXIGA ) 10 MG TABS tablet Take 1 tablet (10 mg total) by mouth daily. 90 tablet 0   dicyclomine  (BENTYL ) 20 MG tablet Take 1 tablet (20 mg total) by mouth 3 (three) times daily as needed. 20 tablet 0   Digestive Enzymes (PAPAYA AND ENZYMES PO) Take 1 tablet by mouth daily.     DULoxetine  (CYMBALTA ) 30 MG capsule Take 2 capsules (60 mg total) by mouth daily. 180 capsule 1   fluticasone  (FLONASE ) 50 MCG/ACT nasal spray Place 2 sprays into both nostrils daily as needed for allergies. 48 g 1   fluticasone  (FLONASE ) 50 MCG/ACT nasal spray Place 2 sprays into both nostrils daily. 16 g 5   fluticasone -salmeterol (ADVAIR  DISKUS) 500-50 MCG/ACT AEPB Inhale 1 puff into the lungs in the morning and at bedtime. (Patient taking differently: Inhale 1 puff into the lungs daily.) 60 each 3   gabapentin  (NEURONTIN ) 300 MG capsule Take 1 capsule (300 mg total) by mouth 3 (three) times daily. 270 capsule 1   glucose blood test strip Use as directed to test blood sugar daily in the afternoon 100 each 3   hydrochlorothiazide  (HYDRODIURIL ) 25 MG tablet Take 1 tablet (25 mg total) by mouth daily. 90 tablet 1   HYDROcodone -acetaminophen  (NORCO/VICODIN) 5-325 MG tablet Take 1 tablet by mouth every 6 (six) hours as needed for moderate pain 30 tablet 0   hyoscyamine  (ANASPAZ ) 0.125 MG TBDP disintergrating tablet Place 1 tablet (0.125 mg total) under the tongue every 6 (six)  hours as needed for cramping. 60 tablet 2   Lancets (FREESTYLE) lancets Use as directed to test once a day 100 each 3   linaclotide  (LINZESS ) 145 MCG CAPS capsule Take 1 capsule (145 mcg total) by mouth daily before breakfast. 30 capsule 2   loperamide  (IMODIUM ) 2 MG capsule Take 1 capsule (2 mg total) by mouth 4 (four) times daily as needed for diarrhea or loose stools. 12 capsule 0   losartan  (COZAAR ) 100 MG tablet Take 1 tablet (100 mg total) by mouth daily. 90 tablet 1   MAGNESIUM  PO Take 1 tablet by mouth daily.     mometasone  (ELOCON ) 0.1 % cream Apply to affected area daily. 135 g 1   Multiple Vitamin (MULTIVITAMIN WITH MINERALS) TABS tablet Take 1 tablet by mouth daily.     Multiple Vitamins-Minerals (HAIR SKIN & NAILS) TABS Take 1 tablet by mouth daily.     Multiple Vitamins-Minerals (ZINC  PO) Take 1 tablet by mouth daily.     Omega-3 Fatty Acids (FISH OIL PO) Take 1 capsule by mouth daily.     omeprazole  (PRILOSEC) 40 MG capsule Take 1 capsule (40 mg total) by mouth daily. 90 capsule 1   ondansetron  (ZOFRAN -ODT) 4 MG disintegrating tablet Dissolve 1 tablet (4 mg total) by mouth every 8 (eight) hours as needed. 20 tablet 0   Polyvinyl Alcohol-Povidone (CLEAR EYES NATURAL TEARS OP) Place 1 drop into both eyes daily as needed (dry eyes).     potassium chloride  SA (KLOR-CON  M) 20 MEQ tablet TAKE TWO TABLETS BY MOUTH ON TUESDAY AND SATURDAY AND ONE TABLET ALL OTHER DAYS. 114 tablet 1   psyllium (REGULOID) 0.52 g capsule Take 0.52 g by mouth daily.     sertraline  (ZOLOFT ) 100 MG tablet Take 1.5 tablets (150 mg total) by mouth daily. 45 tablet 1   tirzepatide  (MOUNJARO ) 15 MG/0.5ML Pen Inject 15 mg into the skin once a week. 6 mL 4  topiramate  (TOPAMAX ) 50 MG tablet Take 1 tablet (50 mg total) by mouth daily. 90 tablet 1   tretinoin  (RETIN-A ) 0.025 % cream Apply topically at bedtime. 135 g 1   No current facility-administered medications for this encounter.     REVIEW OF SYSTEMS: The  patient reports that she is generally well overall and a review of symptoms is otherwise negative.    PHYSICAL EXAM:  Wt Readings from Last 3 Encounters:  06/05/24 149 lb 3.2 oz (67.7 kg)  06/05/24 149 lb 3.2 oz (67.7 kg)  12/26/23 151 lb (68.5 kg)   Temp Readings from Last 3 Encounters:  06/05/24 97.7 F (36.5 C) (Temporal)  06/05/24 97.7 F (36.5 C) (Temporal)  07/02/23 97.6 F (36.4 C) (Temporal)   BP Readings from Last 3 Encounters:  06/05/24 133/82  06/05/24 133/82  12/26/23 102/67   Pulse Readings from Last 3 Encounters:  06/05/24 69  06/05/24 69  12/26/23 66    /10   Physical Exam Vitals and nursing note reviewed.  Constitutional:      General: She is not in acute distress. HENT:     Head: Normocephalic and atraumatic.  Cardiovascular:     Rate and Rhythm: Normal rate.  Pulmonary:     Effort: Pulmonary effort is normal. No respiratory distress.  Abdominal:     General: There is no distension.  Musculoskeletal:        General: Tenderness (mild tenderness to palpation of various joints in hands bilaterally; no tenderness to palpation of L metatarsophalangeal joint) present.     Cervical back: Normal range of motion.  Skin:    General: Skin is warm and dry.  Neurological:     General: No focal deficit present.     Mental Status: She is alert.  Psychiatric:        Mood and Affect: Mood normal.     LABORATORY DATA:  Lab Results  Component Value Date   WBC 7.4 12/26/2023   HGB 13.2 12/26/2023   HCT 39.8 12/26/2023   MCV 84.4 12/26/2023   PLT 371.0 12/26/2023   Lab Results  Component Value Date   NA 141 12/26/2023   K 4.0 12/26/2023   CL 107 12/26/2023   CO2 26 12/26/2023   Lab Results  Component Value Date   ALT 35 12/26/2023   AST 23 12/26/2023   ALKPHOS 83 12/26/2023   BILITOT 0.3 12/26/2023      RADIOGRAPHY: No results found.   PATHOLOGY: No pertinent pathology     IMPRESSION/PLAN:  Ms. Pulley is a 58 yo F w/ a hx of  moderate-severe multijoint arthritis not adequately responsive to conservative measures and whose relief is limited due to medical contraindications to standard conservative treatments.   After a detailed discussion of the patients history, prior treatment response, and current goals, we reviewed the proposed protocol for LDRT targeting the bilateral hands and L MTP joint. Our institutional approach follows a regimen of 3 Gy total, delivered in 6 fractions of 0.5 Gy on a Monday-Wednesday-Friday schedule over two weeks. This dosing is consistent with published guidelines and peer-reviewed data for non-malignant musculoskeletal conditions.  We reviewed:   Goals of care: symptom relief and improved mobility Expected timeline for therapeutic benefit: typically several weeks to months Potential risks, including rare but theoretical concerns regarding late tissue effects or secondary malignancy (risk estimated to be extremely low at this dose and age) Lack of immunosuppression and inactive autoimmune history, minimizing concern for immune-mediated complications   My  goal with low dose radiation therapy is to help reduce chronic joint pain and improve mobility, particularly for activities like walking and doing her hair. This treatment does not reverse joint damage, but it can calm the inflammation in and around the joint that contributes to pain. Based on published data, about 60-80% of patients with osteoarthritis experience meaningful pain relief within a few weeks to months after completing a short course of low-dose radiation. While not everyone responds, the majority of patients do report improvement, and the treatment is generally well tolerated.  The patient was agreeable to proceed. We will arrange CT simulation for planning and initiate treatment pending final review and setup. A follow-up visit will be scheduled approximately 8-12 weeks after completion of LDRT to assess clinical response.   We  personally spent 60 minutes in this encounter including chart review, reviewing radiological studies, meeting face-to-face with the patient, entering orders and completing documentation.    Estefana HERO. Maritza, M.D.   "

## 2024-06-05 NOTE — Progress Notes (Addendum)
 Site of osteoarthritis: Arthritis of hand, unspecified laterality   How long have you had pain? Approximately two years.  What over the counter or prescription medications have you tried? Tylenol  arthritis.  Does anything make the pain better or worse? Doing her hair and cold weather makes it worse.      Ambulatory status? Walker? Wheelchair?: Ambulatory  SAFETY ISSUES: Prior radiation? No Pacemaker/ICD? No Possible current pregnancy? No Is the patient on methotrexate? No  Current Complaints / other details:  She is ready to learn more about the radiation process.     BP 133/82 (BP Location: Left Arm, Patient Position: Sitting)   Pulse 69   Temp 97.7 F (36.5 C) (Temporal)   Resp 18   Ht 5' 1 (1.549 m)   Wt 149 lb 3.2 oz (67.7 kg)   SpO2 99%   BMI 28.19 kg/m

## 2024-06-10 ENCOUNTER — Ambulatory Visit
Admission: RE | Admit: 2024-06-10 | Discharge: 2024-06-10 | Disposition: A | Discharge status: Home / Self Care | Source: Ambulatory Visit | Attending: Radiation Oncology | Admitting: Radiation Oncology

## 2024-06-10 DIAGNOSIS — M19042 Primary osteoarthritis, left hand: Secondary | ICD-10-CM | POA: Insufficient documentation

## 2024-06-10 DIAGNOSIS — M19041 Primary osteoarthritis, right hand: Secondary | ICD-10-CM | POA: Insufficient documentation

## 2024-06-16 ENCOUNTER — Other Ambulatory Visit (HOSPITAL_COMMUNITY): Payer: Self-pay

## 2024-06-16 ENCOUNTER — Other Ambulatory Visit: Payer: Self-pay

## 2024-06-19 ENCOUNTER — Ambulatory Visit
Admission: RE | Admit: 2024-06-19 | Discharge: 2024-06-19 | Disposition: A | Discharge status: Home / Self Care | Source: Ambulatory Visit | Attending: Radiation Oncology | Admitting: Radiation Oncology

## 2024-06-19 ENCOUNTER — Other Ambulatory Visit: Payer: Self-pay

## 2024-06-19 DIAGNOSIS — M19041 Primary osteoarthritis, right hand: Secondary | ICD-10-CM | POA: Insufficient documentation

## 2024-06-19 DIAGNOSIS — M19072 Primary osteoarthritis, left ankle and foot: Secondary | ICD-10-CM | POA: Insufficient documentation

## 2024-06-19 DIAGNOSIS — M19042 Primary osteoarthritis, left hand: Secondary | ICD-10-CM | POA: Insufficient documentation

## 2024-06-19 DIAGNOSIS — Z510 Encounter for antineoplastic radiation therapy: Secondary | ICD-10-CM | POA: Insufficient documentation

## 2024-06-22 ENCOUNTER — Other Ambulatory Visit: Payer: Self-pay

## 2024-06-22 DIAGNOSIS — M19041 Primary osteoarthritis, right hand: Secondary | ICD-10-CM | POA: Diagnosis not present

## 2024-06-22 LAB — RAD ONC ARIA SESSION SUMMARY
Course Elapsed Days: 0
Plan Fractions Treated to Date: 1
Plan Fractions Treated to Date: 1
Plan Prescribed Dose Per Fraction: 0.5 Gy
Plan Prescribed Dose Per Fraction: 0.5 Gy
Plan Total Fractions Prescribed: 6
Plan Total Fractions Prescribed: 6
Plan Total Prescribed Dose: 3 Gy
Plan Total Prescribed Dose: 3 Gy
Reference Point Dosage Given to Date: 0.5 Gy
Reference Point Dosage Given to Date: 0.5 Gy
Reference Point Session Dosage Given: 0.5 Gy
Reference Point Session Dosage Given: 0.5 Gy
Session Number: 1

## 2024-06-24 ENCOUNTER — Other Ambulatory Visit: Payer: Self-pay

## 2024-06-24 ENCOUNTER — Ambulatory Visit
Admission: RE | Admit: 2024-06-24 | Discharge: 2024-06-24 | Disposition: A | Discharge status: Home / Self Care | Source: Ambulatory Visit | Attending: Radiation Oncology | Admitting: Radiation Oncology

## 2024-06-24 DIAGNOSIS — M19041 Primary osteoarthritis, right hand: Secondary | ICD-10-CM | POA: Diagnosis not present

## 2024-06-24 LAB — RAD ONC ARIA SESSION SUMMARY
Course Elapsed Days: 2
Plan Fractions Treated to Date: 2
Plan Fractions Treated to Date: 2
Plan Prescribed Dose Per Fraction: 0.5 Gy
Plan Prescribed Dose Per Fraction: 0.5 Gy
Plan Total Fractions Prescribed: 6
Plan Total Fractions Prescribed: 6
Plan Total Prescribed Dose: 3 Gy
Plan Total Prescribed Dose: 3 Gy
Reference Point Dosage Given to Date: 1 Gy
Reference Point Dosage Given to Date: 1 Gy
Reference Point Session Dosage Given: 0.5 Gy
Reference Point Session Dosage Given: 0.5 Gy
Session Number: 2

## 2024-06-25 ENCOUNTER — Other Ambulatory Visit: Payer: Self-pay

## 2024-06-25 ENCOUNTER — Other Ambulatory Visit (HOSPITAL_COMMUNITY): Payer: Self-pay

## 2024-06-25 MED ORDER — AMPHETAMINE-DEXTROAMPHET ER 15 MG PO CP24
15.0000 mg | ORAL_CAPSULE | Freq: Every morning | ORAL | 0 refills | Status: DC
  Filled 2024-06-25: qty 30, 30d supply, fill #0

## 2024-06-26 ENCOUNTER — Ambulatory Visit
Admission: RE | Admit: 2024-06-26 | Discharge: 2024-06-26 | Disposition: A | Source: Ambulatory Visit | Attending: Radiation Oncology

## 2024-06-26 ENCOUNTER — Other Ambulatory Visit: Payer: Self-pay

## 2024-06-26 DIAGNOSIS — M19041 Primary osteoarthritis, right hand: Secondary | ICD-10-CM | POA: Diagnosis not present

## 2024-06-26 LAB — RAD ONC ARIA SESSION SUMMARY
Course Elapsed Days: 4
Plan Fractions Treated to Date: 3
Plan Fractions Treated to Date: 3
Plan Prescribed Dose Per Fraction: 0.5 Gy
Plan Prescribed Dose Per Fraction: 0.5 Gy
Plan Total Fractions Prescribed: 6
Plan Total Fractions Prescribed: 6
Plan Total Prescribed Dose: 3 Gy
Plan Total Prescribed Dose: 3 Gy
Reference Point Dosage Given to Date: 1.5 Gy
Reference Point Dosage Given to Date: 1.5 Gy
Reference Point Session Dosage Given: 0.5 Gy
Reference Point Session Dosage Given: 0.5 Gy
Session Number: 3

## 2024-06-29 ENCOUNTER — Other Ambulatory Visit: Payer: Self-pay

## 2024-06-29 ENCOUNTER — Ambulatory Visit
Admission: RE | Admit: 2024-06-29 | Discharge: 2024-06-29 | Disposition: A | Source: Ambulatory Visit | Attending: Radiation Oncology

## 2024-06-29 ENCOUNTER — Other Ambulatory Visit (HOSPITAL_COMMUNITY): Payer: Self-pay

## 2024-06-29 DIAGNOSIS — M19041 Primary osteoarthritis, right hand: Secondary | ICD-10-CM | POA: Diagnosis not present

## 2024-06-29 LAB — RAD ONC ARIA SESSION SUMMARY
Course Elapsed Days: 7
Plan Fractions Treated to Date: 4
Plan Fractions Treated to Date: 4
Plan Prescribed Dose Per Fraction: 0.5 Gy
Plan Prescribed Dose Per Fraction: 0.5 Gy
Plan Total Fractions Prescribed: 6
Plan Total Fractions Prescribed: 6
Plan Total Prescribed Dose: 3 Gy
Plan Total Prescribed Dose: 3 Gy
Reference Point Dosage Given to Date: 2 Gy
Reference Point Dosage Given to Date: 2 Gy
Reference Point Session Dosage Given: 0.5 Gy
Reference Point Session Dosage Given: 0.5 Gy
Session Number: 4

## 2024-06-29 MED ORDER — SERTRALINE HCL 100 MG PO TABS
200.0000 mg | ORAL_TABLET | Freq: Every day | ORAL | 1 refills | Status: AC
  Filled 2024-06-29 – 2024-07-14 (×2): qty 60, 30d supply, fill #0

## 2024-07-01 ENCOUNTER — Ambulatory Visit
Admission: RE | Admit: 2024-07-01 | Discharge: 2024-07-01 | Disposition: A | Discharge status: Home / Self Care | Source: Ambulatory Visit | Attending: Radiation Oncology | Admitting: Radiation Oncology

## 2024-07-01 ENCOUNTER — Ambulatory Visit

## 2024-07-01 ENCOUNTER — Other Ambulatory Visit: Payer: Self-pay

## 2024-07-01 DIAGNOSIS — M19041 Primary osteoarthritis, right hand: Secondary | ICD-10-CM | POA: Diagnosis not present

## 2024-07-01 LAB — RAD ONC ARIA SESSION SUMMARY
Course Elapsed Days: 9
Plan Fractions Treated to Date: 5
Plan Fractions Treated to Date: 5
Plan Prescribed Dose Per Fraction: 0.5 Gy
Plan Prescribed Dose Per Fraction: 0.5 Gy
Plan Total Fractions Prescribed: 6
Plan Total Fractions Prescribed: 6
Plan Total Prescribed Dose: 3 Gy
Plan Total Prescribed Dose: 3 Gy
Reference Point Dosage Given to Date: 2.5 Gy
Reference Point Dosage Given to Date: 2.5 Gy
Reference Point Session Dosage Given: 0.5 Gy
Reference Point Session Dosage Given: 0.5 Gy
Session Number: 5

## 2024-07-03 ENCOUNTER — Other Ambulatory Visit: Payer: Self-pay

## 2024-07-03 ENCOUNTER — Ambulatory Visit
Admission: RE | Admit: 2024-07-03 | Discharge: 2024-07-03 | Disposition: A | Discharge status: Home / Self Care | Source: Ambulatory Visit | Attending: Radiation Oncology | Admitting: Radiation Oncology

## 2024-07-03 DIAGNOSIS — M19041 Primary osteoarthritis, right hand: Secondary | ICD-10-CM | POA: Diagnosis not present

## 2024-07-03 LAB — RAD ONC ARIA SESSION SUMMARY
Course Elapsed Days: 11
Plan Fractions Treated to Date: 6
Plan Fractions Treated to Date: 6
Plan Prescribed Dose Per Fraction: 0.5 Gy
Plan Prescribed Dose Per Fraction: 0.5 Gy
Plan Total Fractions Prescribed: 6
Plan Total Fractions Prescribed: 6
Plan Total Prescribed Dose: 3 Gy
Plan Total Prescribed Dose: 3 Gy
Reference Point Dosage Given to Date: 3 Gy
Reference Point Dosage Given to Date: 3 Gy
Reference Point Session Dosage Given: 0.5 Gy
Reference Point Session Dosage Given: 0.5 Gy
Session Number: 6

## 2024-07-06 NOTE — Radiation Completion Notes (Signed)
 Patient Name: Monique Wiggins, Monique Wiggins MRN: [id number] Date of Birth: Jul 25, 1965 Referring Physician: HARLENE HORTON, M.D. Date of Service: 2024-07-06 Radiation Oncologist: Estefana Cha, M.D. 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, left ankle and foot Intent: Palliative     ==========DELIVERED PLANS==========  First Treatment Date: 2024-06-22 Last Treatment Date: 2024-07-03   Plan Name: Ext_RL_Hands Site: Hand, Right Technique: Isodose Plan Mode: Photon Dose Per Fraction: 0.5 Gy Prescribed Dose (Delivered / Prescribed): 3 Gy / 3 Gy Prescribed Fxs (Delivered / Prescribed): 6 / 6   Plan Name: Ext_L_Foot Site: Foot, Left Technique: Isodose Plan Mode: Photon Dose Per Fraction: 0.5 Gy Prescribed Dose (Delivered / Prescribed): 3 Gy / 3 Gy Prescribed Fxs (Delivered / Prescribed): 6 / 6     ==========ON TREATMENT VISIT DATES========== 2024-07-01     ==========UPCOMING VISITS========== 09/03/2024 [facility]-RADIATION ONC FOLLOW UP 20 Cha Estefana, MD        ==========APPENDIX - ON TREATMENT VISIT NOTES==========   See weekly On Treatment Notes in Epic for details in the Media tab (listed as Progress notes on the On Treatment Visit Dates listed above).

## 2024-07-12 ENCOUNTER — Other Ambulatory Visit (HOSPITAL_COMMUNITY): Payer: Self-pay

## 2024-07-14 ENCOUNTER — Other Ambulatory Visit (HOSPITAL_COMMUNITY): Payer: Self-pay

## 2024-07-24 ENCOUNTER — Other Ambulatory Visit (HOSPITAL_COMMUNITY): Payer: Self-pay

## 2024-07-24 MED ORDER — AMPHETAMINE-DEXTROAMPHET ER 15 MG PO CP24
15.0000 mg | ORAL_CAPSULE | Freq: Every morning | ORAL | 0 refills | Status: AC
  Filled 2024-07-24: qty 30, 30d supply, fill #0

## 2024-09-03 ENCOUNTER — Ambulatory Visit: Admitting: Radiation Oncology

## 2024-11-26 ENCOUNTER — Encounter: Admitting: Family Medicine
